# Patient Record
Sex: Female | Born: 1945
Health system: Southern US, Community
[De-identification: ages and names within clinical notes are randomized; demographics above are authoritative.]

## PROBLEM LIST (undated history)

## (undated) DIAGNOSIS — R42 Dizziness and giddiness: Secondary | ICD-10-CM

## (undated) DIAGNOSIS — K219 Gastro-esophageal reflux disease without esophagitis: Secondary | ICD-10-CM

## (undated) DIAGNOSIS — R7303 Prediabetes: Secondary | ICD-10-CM

## (undated) DIAGNOSIS — I10 Essential (primary) hypertension: Secondary | ICD-10-CM

## (undated) DIAGNOSIS — D4860 Neoplasm of uncertain behavior of unspecified breast: Secondary | ICD-10-CM

## (undated) DIAGNOSIS — C801 Malignant (primary) neoplasm, unspecified: Secondary | ICD-10-CM

## (undated) DIAGNOSIS — T7840XA Allergy, unspecified, initial encounter: Secondary | ICD-10-CM

## (undated) DIAGNOSIS — M1990 Unspecified osteoarthritis, unspecified site: Secondary | ICD-10-CM

## (undated) DIAGNOSIS — E785 Hyperlipidemia, unspecified: Secondary | ICD-10-CM

## (undated) HISTORY — DX: Neoplasm of uncertain behavior of unspecified breast: D48.60

## (undated) HISTORY — PX: HAMMER TOE SURGERY: SHX385

## (undated) HISTORY — DX: Hyperlipidemia, unspecified: E78.5

## (undated) HISTORY — PX: BUNIONECTOMY: SHX129

## (undated) HISTORY — PX: HEEL SPUR SURGERY: SHX665

## (undated) HISTORY — DX: Allergy, unspecified, initial encounter: T78.40XA

## (undated) HISTORY — DX: Essential (primary) hypertension: I10

---

## 1966-01-15 HISTORY — PX: BREAST EXCISIONAL BIOPSY: SUR124

## 2002-01-15 HISTORY — PX: FOOT SURGERY: SHX648

## 2003-04-02 ENCOUNTER — Other Ambulatory Visit: Payer: Self-pay

## 2005-01-15 DIAGNOSIS — D4860 Neoplasm of uncertain behavior of unspecified breast: Secondary | ICD-10-CM

## 2005-01-15 HISTORY — PX: BREAST EXCISIONAL BIOPSY: SUR124

## 2005-01-15 HISTORY — DX: Neoplasm of uncertain behavior of unspecified breast: D48.60

## 2005-03-06 ENCOUNTER — Ambulatory Visit: Payer: Self-pay | Admitting: Family Medicine

## 2005-03-16 ENCOUNTER — Ambulatory Visit: Payer: Self-pay | Admitting: Family Medicine

## 2005-04-02 ENCOUNTER — Other Ambulatory Visit: Payer: Self-pay

## 2005-04-03 ENCOUNTER — Ambulatory Visit: Payer: Self-pay | Admitting: General Surgery

## 2005-07-16 ENCOUNTER — Ambulatory Visit: Payer: Self-pay | Admitting: General Surgery

## 2006-03-19 ENCOUNTER — Ambulatory Visit: Payer: Self-pay | Admitting: General Surgery

## 2006-06-10 ENCOUNTER — Emergency Department: Payer: Self-pay | Admitting: Emergency Medicine

## 2006-06-10 ENCOUNTER — Other Ambulatory Visit: Payer: Self-pay

## 2007-03-31 ENCOUNTER — Ambulatory Visit: Payer: Self-pay | Admitting: General Surgery

## 2007-07-30 ENCOUNTER — Ambulatory Visit: Payer: Self-pay | Admitting: Family Medicine

## 2008-03-31 ENCOUNTER — Ambulatory Visit: Payer: Self-pay | Admitting: General Surgery

## 2009-01-15 HISTORY — PX: COLONOSCOPY: SHX174

## 2009-01-15 LAB — HM COLONOSCOPY: HM COLON: NORMAL

## 2009-04-01 ENCOUNTER — Ambulatory Visit: Payer: Self-pay | Admitting: General Surgery

## 2009-08-29 ENCOUNTER — Ambulatory Visit: Payer: Self-pay | Admitting: Gastroenterology

## 2009-09-29 ENCOUNTER — Ambulatory Visit: Payer: Self-pay | Admitting: Gastroenterology

## 2010-04-17 ENCOUNTER — Ambulatory Visit: Payer: Self-pay | Admitting: General Surgery

## 2011-04-18 ENCOUNTER — Ambulatory Visit: Payer: Self-pay | Admitting: General Surgery

## 2012-03-08 ENCOUNTER — Encounter: Payer: Self-pay | Admitting: *Deleted

## 2012-04-23 ENCOUNTER — Ambulatory Visit: Payer: Self-pay | Admitting: Family Medicine

## 2012-04-23 ENCOUNTER — Ambulatory Visit: Payer: Self-pay | Admitting: General Surgery

## 2012-04-23 LAB — HM DEXA SCAN: HM Dexa Scan: NORMAL

## 2012-04-24 ENCOUNTER — Encounter: Payer: Self-pay | Admitting: General Surgery

## 2012-05-12 ENCOUNTER — Ambulatory Visit (INDEPENDENT_AMBULATORY_CARE_PROVIDER_SITE_OTHER): Payer: Medicare Other | Admitting: General Surgery

## 2012-05-12 ENCOUNTER — Encounter: Payer: Self-pay | Admitting: General Surgery

## 2012-05-12 ENCOUNTER — Other Ambulatory Visit: Payer: Self-pay | Admitting: *Deleted

## 2012-05-12 VITALS — BP 130/74 | HR 72 | Resp 14 | Ht 67.0 in | Wt 187.0 lb

## 2012-05-12 DIAGNOSIS — Z1231 Encounter for screening mammogram for malignant neoplasm of breast: Secondary | ICD-10-CM

## 2012-05-12 DIAGNOSIS — D4860 Neoplasm of uncertain behavior of unspecified breast: Secondary | ICD-10-CM

## 2012-05-12 DIAGNOSIS — Z803 Family history of malignant neoplasm of breast: Secondary | ICD-10-CM | POA: Insufficient documentation

## 2012-05-12 NOTE — Progress Notes (Signed)
Patient will be asked to return to the office in one year for a bilateral screening mammogram. 

## 2012-05-12 NOTE — Patient Instructions (Addendum)
Patient to return in 1 year with Bilateral Screening Mammogram. Patient asked to bring all medications with her to next office visit.

## 2012-05-12 NOTE — Progress Notes (Signed)
Patient ID: Stacy [name], female   DOB: Dec 01, 1945, 67 y.o.   MRN: [id number]  Chief Complaint  Patient presents with  . Follow-up    mammogram    HPI Stacy [name] is a 67 y.o. female here today following from her mammogram done on 04/23/12 cat 2. She has history of ALH.  Patient states she feels no lumps and is not having any breast problems. The patient has had breast biopsies in the past. She has a sister with a history of breast cancer. Past states she is on a medication but is unable to tell me the name of medication.  HPI  Past Medical History  Diagnosis Date  . Hypertension   . Neoplasm of uncertain behavior of breast 2007    Past Surgical History  Procedure Laterality Date  . Breast biopsy  1968  . Foot surgery  2004  . Colonoscopy  2011    DR,ISHAKIS    Family History  Problem Relation Age of Onset  . Breast cancer Sister     Social History History  Substance Use Topics  . Smoking status: Never Smoker   . Smokeless tobacco: Not on file  . Alcohol Use: No    Allergies  Allergen Reactions  . Altace (Ramipril) Swelling    Current Outpatient Prescriptions  Medication Sig Dispense Refill  . aspirin 81 MG tablet Take 81 mg by mouth daily.       No current facility-administered medications for this visit.    Review of Systems Review of Systems  Constitutional: Negative.   Respiratory: Negative.   Cardiovascular: Negative.     Blood pressure 130/74, pulse 72, resp. rate 14, height 5\' 7"  (1.702 m), weight 187 lb (84.823 kg).  Physical Exam Physical Exam  Constitutional: She appears well-developed and well-nourished.  Eyes: Conjunctivae are normal. No scleral icterus.  Neck: Trachea normal. No mass and no thyromegaly present.  Cardiovascular: Normal rate, regular rhythm and normal heart sounds.   No murmur heard. Pulses:      Dorsalis pedis pulses are 2+ on the right side, and 2+ on the left side.       Posterior tibial pulses are 2+ on the right side, and  2+ on the left side.  No edema in the legs. Mild skin browning lower 1/3 of both legs. Minimal varicose veins.   Pulmonary/Chest: Effort normal and breath sounds normal. Right breast exhibits no inverted nipple, no mass, no nipple discharge, no skin change and no tenderness. Left breast exhibits no inverted nipple, no mass, no nipple discharge, no skin change and no tenderness. Breasts are symmetrical.  Abdominal: Soft. Normal appearance and bowel sounds are normal. There is no hepatosplenomegaly. There is no tenderness. No hernia.  Lymphadenopathy:    She has no cervical adenopathy.    She has no axillary adenopathy.    Data Reviewed Mammogram reviewed and stable.    Assessment    Stable Exam.     Plan    1 year follow up with mammogram.        Stacy [name] G 05/12/2012, 7:44 PM

## 2012-05-13 ENCOUNTER — Other Ambulatory Visit: Payer: Self-pay | Admitting: *Deleted

## 2013-01-15 HISTORY — PX: BREAST BIOPSY: SHX20

## 2013-03-11 ENCOUNTER — Encounter: Payer: Self-pay | Admitting: *Deleted

## 2013-04-24 ENCOUNTER — Ambulatory Visit: Payer: Self-pay | Admitting: General Surgery

## 2013-05-01 ENCOUNTER — Ambulatory Visit: Payer: Self-pay | Admitting: General Surgery

## 2013-05-11 ENCOUNTER — Encounter: Payer: Self-pay | Admitting: General Surgery

## 2013-05-12 ENCOUNTER — Ambulatory Visit (INDEPENDENT_AMBULATORY_CARE_PROVIDER_SITE_OTHER): Payer: Commercial Managed Care - HMO | Admitting: General Surgery

## 2013-05-12 ENCOUNTER — Encounter: Payer: Self-pay | Admitting: General Surgery

## 2013-05-12 ENCOUNTER — Telehealth: Payer: Self-pay

## 2013-05-12 VITALS — BP 130/68 | HR 72 | Resp 12 | Ht 66.0 in | Wt 185.0 lb

## 2013-05-12 DIAGNOSIS — R920 Mammographic microcalcification found on diagnostic imaging of breast: Secondary | ICD-10-CM

## 2013-05-12 DIAGNOSIS — Z803 Family history of malignant neoplasm of breast: Secondary | ICD-10-CM

## 2013-05-12 NOTE — Progress Notes (Signed)
Patient ID: Stacy [name], female   DOB: 03/06/1945, 68 y.o.   MRN: [id number]  Chief Complaint  Patient presents with  . Follow-up    1 year follow up screening mammogram     HPI Stacy [name] is a 68 y.o. female who presents for a breast evaluation. Has history of ALH and her sister with breast cancer. The most recent mammogram was done on 05/01/13. Patient does perform regular self breast checks and gets regular mammograms done.  The patient denies any new problems with her breasts at this time.    HPI  Past Medical History  Diagnosis Date  . Hypertension   . Neoplasm of uncertain behavior of breast 2007    Past Surgical History  Procedure Laterality Date  . Breast biopsy  1968  . Foot surgery  2004  . Colonoscopy  2011    DR,ISHAKIS    Family History  Problem Relation Age of Onset  . Breast cancer Sister     Social History History  Substance Use Topics  . Smoking status: Never Smoker   . Smokeless tobacco: Not on file  . Alcohol Use: No    Allergies  Allergen Reactions  . Altace [Ramipril] Swelling    Current Outpatient Prescriptions  Medication Sig Dispense Refill  . aspirin 81 MG tablet Take 81 mg by mouth daily.      . Cholecalciferol (VITAMIN D3) 2000 UNITS TABS Take 1 tablet by mouth daily.      . Cinnamon 500 MG capsule Take 1,000 mg by mouth daily.      Marland Kitchen FISH OIL-BORAGE-FLAX-SAFFLOWER PO Take 2 tablets by mouth daily.      Marland Kitchen POTASSIUM PO Take 1 tablet by mouth daily.      Marland Kitchen triamterene-hydrochlorothiazide (MAXZIDE) 75-50 MG per tablet Take 1 tablet by mouth daily.       No current facility-administered medications for this visit.    Review of Systems Review of Systems  Constitutional: Negative.   Respiratory: Negative.   Cardiovascular: Negative.     Blood pressure 130/68, pulse 72, resp. rate 12, height 5\' 6"  (1.676 m), weight 185 lb (83.915 kg).  Physical Exam Physical Exam  Constitutional: She is oriented to person, place, and time. She  appears well-developed and well-nourished.  Eyes: Conjunctivae are normal. No scleral icterus.  Neck: Neck supple. No thyromegaly present.  Cardiovascular: Normal rate, regular rhythm and normal heart sounds.   No murmur heard. Pulmonary/Chest: Effort normal and breath sounds normal. Right breast exhibits no inverted nipple, no mass, no nipple discharge, no skin change and no tenderness. Left breast exhibits no inverted nipple, no mass, no nipple discharge, no skin change and no tenderness.  Abdominal: Soft. Normal appearance and bowel sounds are normal. There is no hepatosplenomegaly. There is no tenderness. No hernia.  Lymphadenopathy:    She has no cervical adenopathy.    She has no axillary adenopathy.  Neurological: She is alert and oriented to person, place, and time.  Skin: Skin is warm and dry.    Data Reviewed  Mammogram reviewed. 3 focal clusters of microcalcification  in right breast.   Assessment    Mammographic microcalcification right breast. Family history of breast cancer.     Plan    Discussed stereotactic biopsy. Pt agreeable. Procedure explained to her        Seeplaputhur Robinette Haines 05/12/2013, 1:12 PM

## 2013-05-12 NOTE — Patient Instructions (Signed)
Patient to be scheduled for right breast stereotactic breast biopsy. Continue self breast exams. Call office for any new breast issues or concerns.  Stereotactic Breast Biopsy  A stereotactic breast biopsy is a procedure in which mammography is used in the collection of a sample of breast tissue. Mammography is a type of X-ray exam of the breasts that produces an image called a mammogram. The mammogram allows your health care provider to precisely locate the area of the breast from which a tissue sample will be taken. The tissue is then examined under a microscope to see if cancerous cells are present. A breast biopsy is done when:   A lump, abnormality, or mass is seen in the breast on a breast X-ray (mammogram).   Small calcium deposits (calcifications) are seen in the breast.   The shape or appearance of the breasts changes.   The shape or appearance of the nipples changes. You may have unusual or bloody discharge coming from the nipples, or you may have crusting, retraction, or dimpling of the nipples. A breast biopsy can indicate if you need surgery or other treatment.  LET [facility] CARE PROVIDER KNOW ABOUT:  Any allergies you have.  All medicines you are taking, including vitamins, herbs, eye drops, creams, and over-the-counter medicines.  Previous problems you or members of your family have had with the use of anesthetics.  Any blood disorders you have.  Previous surgeries you have had.  Medical conditions you have. RISKS AND COMPLICATIONS Generally, stereotactic breast biopsy is a safe procedure. However, as with any procedure, complications can occur. Possible complications include:  Infection at the needle-insertion site.   Bleeding or bruising after surgery.  The breast may become altered or deformed as a result of the procedure.  The needle may go through the chest wall into the lung area.  BEFORE THE PROCEDURE  Wear a supportive bra to the procedure.  You  will be asked to remove jewelry, dentures, eyeglasses, metal objects, or clothing that might interfere with the X-ray images. You may want to leave some of these objects at home.  Arrange for someone to drive you home after the procedure if desired. PROCEDURE  A stereotactic breast biopsy is done while you are awake. During the procedure, relax as much as possible. Let your health care provider know if you are uncomfortable, anxious, or in pain. Usually, the only discomfort felt during the procedure is caused by staying in one position for the length of the procedure. This discomfort can be reduced by carefully placed cushions. Most of the time the biopsy is done using a table with openings on it. You will be asked to lie facedown on the table and place your breasts through the openings. Your breast is compressed between metal plates to get good X-ray images. Your skin will be cleaned, and a numbing medicine (local anesthetic) will be injected. A small cut (incision) will be made in your breast. The tip of the biopsy needle will be directed through the incision. Several small pieces of suspicious tissue will be taken. Then, a final set of X-ray images will be obtained. If they show that the suspicious tissue has been mostly or completely removed, a small clip will be left at the biopsy site. This is done so that the biopsy site can be easily located if the results of the biopsy show that the tissue is cancerous.  After the procedure, the incision will be stitched (sutured) or taped and covered with a bandage (  dressing). Your health care provider may apply a pressure dressing and an ice pack to prevent bleeding and swelling in the breast.  A stereotactic breast biopsy can take 30 minutes or more. AFTER THE PROCEDURE  If you are doing well and have no problems, you will be allowed to go home.  Document Released: 09/30/2002 Document Revised: 09/03/2012 Document Reviewed: 07/31/2012 [facility] Patient  Information 2014 [place], [place].

## 2013-05-12 NOTE — Telephone Encounter (Signed)
Patient is scheduled for a Right Stereo Biopsy at [facility] on 05/25/13 at 3:00 pm. She will arrive by 2:45 pm. Patient is scheduled for a follow up appointment with the nurse for 06/01/13 at 9:30 am. Patient is aware of dates, times, and instructions.

## 2013-05-25 ENCOUNTER — Ambulatory Visit: Payer: Self-pay | Admitting: General Surgery

## 2013-05-25 DIAGNOSIS — R920 Mammographic microcalcification found on diagnostic imaging of breast: Secondary | ICD-10-CM

## 2013-05-27 ENCOUNTER — Telehealth: Payer: Self-pay | Admitting: *Deleted

## 2013-05-27 LAB — PATHOLOGY REPORT

## 2013-05-27 NOTE — Telephone Encounter (Signed)
Placed in recalls for 6 month DX right mammogram. .asa

## 2013-05-27 NOTE — Telephone Encounter (Signed)
Notified patient as instructed, patient pleased. Benign pathology per Dr Jamal Collin and Dr Reuel Derby. Discussed follow-up appointments, patient agrees.

## 2013-05-28 ENCOUNTER — Encounter: Payer: Self-pay | Admitting: General Surgery

## 2013-06-01 ENCOUNTER — Ambulatory Visit (INDEPENDENT_AMBULATORY_CARE_PROVIDER_SITE_OTHER): Payer: Self-pay | Admitting: *Deleted

## 2013-06-01 DIAGNOSIS — R920 Mammographic microcalcification found on diagnostic imaging of breast: Secondary | ICD-10-CM

## 2013-06-01 NOTE — Progress Notes (Signed)
Patient here today for follow up pos rightt breast biopsy.   Minimal bruising noted.  The patient is aware that a heating pad may be used for comfort as needed.  Aware of pathology. Follow up as scheduled.

## 2013-06-10 ENCOUNTER — Encounter: Payer: Self-pay | Admitting: General Surgery

## 2013-10-01 LAB — LIPID PANEL
Cholesterol: 167 mg/dL (ref 0–200)
HDL: 73 mg/dL — AB (ref 35–70)
LDL CALC: 80 mg/dL
TRIGLYCERIDES: 72 mg/dL (ref 40–160)

## 2013-10-01 LAB — HM PAP SMEAR: HM Pap smear: NORMAL

## 2013-10-01 LAB — HEMOGLOBIN A1C: HEMOGLOBIN A1C: 6.3 % — AB (ref 4.0–6.0)

## 2013-11-12 ENCOUNTER — Encounter: Payer: Self-pay | Admitting: Podiatry

## 2013-11-12 ENCOUNTER — Ambulatory Visit (INDEPENDENT_AMBULATORY_CARE_PROVIDER_SITE_OTHER): Payer: Commercial Managed Care - HMO | Admitting: Podiatry

## 2013-11-12 ENCOUNTER — Ambulatory Visit (INDEPENDENT_AMBULATORY_CARE_PROVIDER_SITE_OTHER): Payer: Commercial Managed Care - HMO

## 2013-11-12 VITALS — BP 132/74 | HR 91 | Resp 17

## 2013-11-12 DIAGNOSIS — M7662 Achilles tendinitis, left leg: Secondary | ICD-10-CM

## 2013-11-12 DIAGNOSIS — M25572 Pain in left ankle and joints of left foot: Secondary | ICD-10-CM

## 2013-11-12 DIAGNOSIS — M898X7 Other specified disorders of bone, ankle and foot: Secondary | ICD-10-CM

## 2013-11-12 DIAGNOSIS — M2570 Osteophyte, unspecified joint: Secondary | ICD-10-CM

## 2013-11-12 DIAGNOSIS — M25472 Effusion, left ankle: Secondary | ICD-10-CM

## 2013-11-12 DIAGNOSIS — M25475 Effusion, left foot: Secondary | ICD-10-CM

## 2013-11-12 NOTE — Patient Instructions (Signed)
Achilles Tendinitis   with Rehab  Achilles tendinitis is a disorder of the Achilles tendon. The Achilles tendon connects the large calf muscles (Gastrocnemius and Soleus) to the heel bone (calcaneus). This tendon is sometimes called the heel cord. It is important for pushing-off and standing on your toes and is important for walking, running, or jumping. Tendinitis is often caused by overuse and repetitive microtrauma.  SYMPTOMS  · Pain, tenderness, swelling, warmth, and redness may occur over the Achilles tendon even at rest.  · Pain with pushing off, or flexing or extending the ankle.  · Pain that is worsened after or during activity.  CAUSES   · Overuse sometimes seen with rapid increase in exercise programs or in sports requiring running and jumping.  · Poor physical conditioning (strength and flexibility or endurance).  · Running sports, especially training running down hills.  · Inadequate warm-up before practice or play or failure to stretch before participation.  · Injury to the tendon.  PREVENTION   · Warm up and stretch before practice or competition.  · Allow time for adequate rest and recovery between practices and competition.  · Keep up conditioning.  ¨ Keep up ankle and leg flexibility.  ¨ Improve or keep muscle strength and endurance.  ¨ Improve cardiovascular fitness.  · Use proper technique.  · Use proper equipment (shoes, skates).  · To help prevent recurrence, taping, protective strapping, or an adhesive bandage may be recommended for several weeks after healing is complete.  PROGNOSIS   · Recovery may take weeks to several months to heal.  · Longer recovery is expected if symptoms have been prolonged.  · Recovery is usually quicker if the inflammation is due to a direct blow as compared with overuse or sudden strain.  RELATED COMPLICATIONS   · Healing time will be prolonged if the condition is not correctly treated. The injury must be given plenty of time to heal.  · Symptoms can reoccur if  activity is resumed too soon.  · Untreated, tendinitis may increase the risk of tendon rupture requiring additional time for recovery and possibly surgery.  TREATMENT   · The first treatment consists of rest anti-inflammatory medication, and ice to relieve the pain.  · Stretching and strengthening exercises after resolution of pain will likely help reduce the risk of recurrence. Referral to a physical therapist or athletic trainer for further evaluation and treatment may be helpful.  · A walking boot or cast may be recommended to rest the Achilles tendon. This can help break the cycle of inflammation and microtrauma.  · Arch supports (orthotics) may be prescribed or recommended by your caregiver as an adjunct to therapy and rest.  · Surgery to remove the inflamed tendon lining or degenerated tendon tissue is rarely necessary and has shown less than predictable results.  MEDICATION   · Nonsteroidal anti-inflammatory medications, such as aspirin and ibuprofen, may be used for pain and inflammation relief. Do not take within 7 days before surgery. Take these as directed by your caregiver. Contact your caregiver immediately if any bleeding, stomach upset, or signs of allergic reaction occur. Other minor pain relievers, such as acetaminophen, may also be used.  · Pain relievers may be prescribed as necessary by your caregiver. Do not take prescription pain medication for longer than 4 to 7 days. Use only as directed and only as much as you need.  · Cortisone injections are rarely indicated. Cortisone injections may weaken tendons and predispose to rupture. It is better   to give the condition more time to heal than to use them.  HEAT AND COLD  · Cold is used to relieve pain and reduce inflammation for acute and chronic Achilles tendinitis. Cold should be applied for 10 to 15 minutes every 2 to 3 hours for inflammation and pain and immediately after any activity that aggravates your symptoms. Use ice packs or an ice  massage.  · Heat may be used before performing stretching and strengthening activities prescribed by your caregiver. Use a heat pack or a warm soak.  SEEK MEDICAL CARE IF:  · Symptoms get worse or do not improve in 2 weeks despite treatment.  · New, unexplained symptoms develop. Drugs used in treatment may produce side effects.  EXERCISES  RANGE OF MOTION (ROM) AND STRETCHING EXERCISES - Achilles Tendinitis   These exercises may help you when beginning to rehabilitate your injury. Your symptoms may resolve with or without further involvement from your physician, physical therapist or athletic trainer. While completing these exercises, remember:   · Restoring tissue flexibility helps normal motion to return to the joints. This allows healthier, less painful movement and activity.  · An effective stretch should be held for at least 30 seconds.  · A stretch should never be painful. You should only feel a gentle lengthening or release in the stretched tissue.  STRETCH - Gastroc, Standing   · Place hands on wall.  · Extend right / left leg, keeping the front knee somewhat bent.  · Slightly point your toes inward on your back foot.  · Keeping your right / left heel on the floor and your knee straight, shift your weight toward the wall, not allowing your back to arch.  · You should feel a gentle stretch in the right / left calf. Hold this position for __________ seconds.  Repeat __________ times. Complete this stretch __________ times per day.  STRETCH - Soleus, Standing   · Place hands on wall.  · Extend right / left leg, keeping the other knee somewhat bent.  · Slightly point your toes inward on your back foot.  · Keep your right / left heel on the floor, bend your back knee, and slightly shift your weight over the back leg so that you feel a gentle stretch deep in your back calf.  · Hold this position for __________ seconds.  Repeat __________ times. Complete this stretch __________ times per day.  STRETCH -  Gastrocsoleus, Standing   Note: This exercise can place a lot of stress on your foot and ankle. Please complete this exercise only if specifically instructed by your caregiver.   · Place the ball of your right / left foot on a step, keeping your other foot firmly on the same step.  · Hold on to the wall or a rail for balance.  · Slowly lift your other foot, allowing your body weight to press your heel down over the edge of the step.  · You should feel a stretch in your right / left calf.  · Hold this position for __________ seconds.  · Repeat this exercise with a slight bend in your knee.  Repeat __________ times. Complete this stretch __________ times per day.   STRENGTHENING EXERCISES - Achilles Tendinitis  These exercises may help you when beginning to rehabilitate your injury. They may resolve your symptoms with or without further involvement from your physician, physical therapist or athletic trainer. While completing these exercises, remember:   · Muscles can gain both the endurance   and the strength needed for everyday activities through controlled exercises.  · Complete these exercises as instructed by your physician, physical therapist or athletic trainer. Progress the resistance and repetitions only as guided.  · You may experience muscle soreness or fatigue, but the pain or discomfort you are trying to eliminate should never worsen during these exercises. If this pain does worsen, stop and make certain you are following the directions exactly. If the pain is still present after adjustments, discontinue the exercise until you can discuss the trouble with your clinician.  STRENGTH - Plantar-flexors   · Sit with your right / left leg extended. Holding onto both ends of a rubber exercise band/tubing, loop it around the ball of your foot. Keep a slight tension in the band.  · Slowly push your toes away from you, pointing them downward.  · Hold this position for __________ seconds. Return slowly, controlling the  tension in the band/tubing.  Repeat __________ times. Complete this exercise __________ times per day.   STRENGTH - Plantar-flexors   · Stand with your feet shoulder width apart. Steady yourself with a wall or table using as little support as needed.  · Keeping your weight evenly spread over the width of your feet, rise up on your toes.*  · Hold this position for __________ seconds.  Repeat __________ times. Complete this exercise __________ times per day.   *If this is too easy, shift your weight toward your right / left leg until you feel challenged. Ultimately, you may be asked to do this exercise with your right / left foot only.  STRENGTH - Plantar-flexors, Eccentric   Note: This exercise can place a lot of stress on your foot and ankle. Please complete this exercise only if specifically instructed by your caregiver.   · Place the balls of your feet on a step. With your hands, use only enough support from a wall or rail to keep your balance.  · Keep your knees straight and rise up on your toes.  · Slowly shift your weight entirely to your right / left toes and pick up your opposite foot. Gently and with controlled movement, lower your weight through your right / left foot so that your heel drops below the level of the step. You will feel a slight stretch in the back of your calf at the end position.  · Use the healthy leg to help rise up onto the balls of both feet, then lower weight only on the right / left leg again. Build up to 15 repetitions. Then progress to 3 consecutive sets of 15 repetitions.*  · After completing the above exercise, complete the same exercise with a slight knee bend (about 30 degrees). Again, build up to 15 repetitions. Then progress to 3 consecutive sets of 15 repetitions.*  Perform this exercise __________ times per day.   *When you easily complete 3 sets of 15, your physician, physical therapist or athletic trainer may advise you to add resistance by wearing a backpack filled with  additional weight.  STRENGTH - Plantar Flexors, Seated   · Sit on a chair that allows your feet to rest flat on the ground. If necessary, sit at the edge of the chair.  · Keeping your toes firmly on the ground, lift your right / left heel as far as you can without increasing any discomfort in your ankle.  Repeat __________ times. Complete this exercise __________ times a day.  *If instructed by your physician, physical therapist or athletic   trainer, you may add ____________________ of resistance by placing a weighted object on your right / left knee.  Document Released: 08/02/2004 Document Revised: 03/26/2011 Document Reviewed: 04/15/2008  [employer]® Patient Information ©2015 [employer]. This information is not intended to replace advice given to you by your health care provider. Make sure you discuss any questions you have with your health care provider.

## 2013-11-12 NOTE — Progress Notes (Signed)
   Subjective:    Patient ID: Stacy [name], female    DOB: 05/11/1945, 67 y.o.   MRN: [id number]  HPI Stacy [name], 68 year old female, presents the office today with complaints of pain in the back of her left heel which has been ongoing for several months.she previously is undergone posterior calcaneal exostectomy to the right foot. She states for the left foot she has been using no break without much resolution in symptoms. She denies any recent injury or trauma to the area. Pain is mostly with weightbearing and prolonged ambulation. No other complaints at this time.   Review of Systems  All other systems reviewed and are negative.      Objective:   Physical Exam Objective: AAO x3, NAD DP/PT pulses palpable bilaterally, CRT less than 3 seconds; varicose veins bilaterally. Protective sensation intact with Derrel Nip monofilament, vibratory sensation intact, Achilles tendon reflex intact Tenderness palpation over the posterior aspect of the left calcaneus at the insertion of the Achilles tendon. No pain on the course of the Achilles tendon itself. Thompson test was performed and the Achilles tendon appears intact. There is no pain with lateral compression of the calcaneus or with vibratory sensation. No overlying edema, erythema, increased warmth. MMT 5/5, ROM WNL No calf pain, swelling, warmth, erythema No open lesions.       Assessment & Plan:  68 year old female with left retrocalcaneal exostosis and insertional Achilles tendinitis. -X-rays were obtained and reviewed with the patient. -Conservative versus surgical treatment discussed including alternatives, risks, complications. -For now we'll start with conservative therapy. Heel lifts were dispensed to apply to the shoes. Also discussed stretching exercises.  -Ice to the afected area. -If she continues to have symptoms despite conservative therapy we'll proceed with surgical intervention. -follow-up in one month or sooner  if any problems are to arise or any changes symptoms. In the meantime call the office with any questions, concerns.

## 2013-11-16 ENCOUNTER — Encounter: Payer: Self-pay | Admitting: Podiatry

## 2013-11-23 LAB — HM MAMMOGRAPHY

## 2013-11-25 ENCOUNTER — Encounter: Payer: Self-pay | Admitting: General Surgery

## 2013-11-25 ENCOUNTER — Ambulatory Visit: Payer: Self-pay | Admitting: General Surgery

## 2013-12-03 ENCOUNTER — Encounter: Payer: Self-pay | Admitting: General Surgery

## 2013-12-03 ENCOUNTER — Ambulatory Visit (INDEPENDENT_AMBULATORY_CARE_PROVIDER_SITE_OTHER): Payer: Commercial Managed Care - HMO | Admitting: General Surgery

## 2013-12-03 VITALS — BP 132/74 | HR 76 | Resp 12 | Ht 66.0 in | Wt 188.0 lb

## 2013-12-03 DIAGNOSIS — D4860 Neoplasm of uncertain behavior of unspecified breast: Secondary | ICD-10-CM

## 2013-12-03 DIAGNOSIS — Z803 Family history of malignant neoplasm of breast: Secondary | ICD-10-CM

## 2013-12-03 NOTE — Patient Instructions (Addendum)
Patient to return in six month bilateral diagnotic mammogram and MRI.Continue self breast exams. Call office for any new breast issues or concerns.

## 2013-12-03 NOTE — Progress Notes (Signed)
Patient ID: Stacy [name], female   DOB: 12-10-1945, 68 y.o.   MRN: [id number]  Chief Complaint  Patient presents with  . Follow-up    mammogram    HPI Stacy [name] is a 68 y.o. female who presents for a breast evaluation. The most recent right breast mammogram was done on 11/23/13 .  Patient does perform regular self breast checks and gets regular mammograms done.    HPI  Past Medical History  Diagnosis Date  . Hypertension   . Neoplasm of uncertain behavior of breast 2007    Past Surgical History  Procedure Laterality Date  . Breast biopsy  1968  . Foot surgery  2004  . Colonoscopy  2011    DR,ISHAKIS    Family History  Problem Relation Age of Onset  . Breast cancer Sister     Social History History  Substance Use Topics  . Smoking status: Never Smoker   . Smokeless tobacco: Not on file  . Alcohol Use: No    Allergies  Allergen Reactions  . Altace [Ramipril] Swelling    Current Outpatient Prescriptions  Medication Sig Dispense Refill  . aspirin 81 MG tablet Take 81 mg by mouth daily.    . Cholecalciferol (VITAMIN D3) 2000 UNITS TABS Take 1 tablet by mouth daily.    . Cinnamon 500 MG capsule Take 1,000 mg by mouth daily.    Marland Kitchen FISH OIL-BORAGE-FLAX-SAFFLOWER PO Take 2 tablets by mouth daily.    Marland Kitchen POTASSIUM PO Take 1 tablet by mouth daily.    Marland Kitchen triamterene-hydrochlorothiazide (MAXZIDE) 75-50 MG per tablet Take 1 tablet by mouth daily.     No current facility-administered medications for this visit.    Review of Systems Review of Systems  Constitutional: Negative.   Respiratory: Negative.   Cardiovascular: Negative.     Blood pressure 132/74, pulse 76, resp. rate 12, height 5\' 6"  (1.676 m), weight 188 lb (85.276 kg).  Physical Exam Physical Exam  Constitutional: She is oriented to person, place, and time. She appears well-developed and well-nourished.  Eyes: Conjunctivae are normal. No scleral icterus.  Neck: Neck supple.  Cardiovascular:  Normal rate, regular rhythm and normal heart sounds.   Pulmonary/Chest: Effort normal and breath sounds normal. Right breast exhibits no inverted nipple, no mass, no nipple discharge, no skin change and no tenderness. Left breast exhibits no inverted nipple, no mass, no skin change and no tenderness.  Lymphadenopathy:    She has no cervical adenopathy.    She has no axillary adenopathy.  Neurological: She is alert and oriented to person, place, and time.  Skin: Skin is warm and dry.    Data Reviewed Mammogram right reviewed. No changes noticed and biopsy markers( three ) noticed with no developing abnormalities.  Assessment   Stable exam. Pt had stereo biopsy of 3 areas in right breast 6 mos ago. All showed FC changes, , one with tiny focus of radial scar. This was discussed with pathologist then. Pt is high risk due to [place] and prior biopsy showing ALH. Will add MRI breast to her scheduled screening mammogram in 6 mos.     Plan    Patient to return in six month bilateral diagnotic mammogram and MRI.       Chrstopher Malenfant G 12/03/2013, 2:16 PM

## 2013-12-15 ENCOUNTER — Ambulatory Visit: Payer: Commercial Managed Care - HMO | Admitting: Podiatry

## 2013-12-24 ENCOUNTER — Ambulatory Visit: Payer: Commercial Managed Care - HMO | Admitting: Podiatry

## 2013-12-24 ENCOUNTER — Ambulatory Visit (INDEPENDENT_AMBULATORY_CARE_PROVIDER_SITE_OTHER): Payer: Commercial Managed Care - HMO | Admitting: Podiatry

## 2013-12-24 VITALS — BP 125/74 | HR 82 | Resp 16

## 2013-12-24 DIAGNOSIS — M2570 Osteophyte, unspecified joint: Secondary | ICD-10-CM

## 2013-12-24 DIAGNOSIS — M7662 Achilles tendinitis, left leg: Secondary | ICD-10-CM

## 2013-12-24 DIAGNOSIS — M898X7 Other specified disorders of bone, ankle and foot: Secondary | ICD-10-CM

## 2013-12-24 NOTE — Progress Notes (Signed)
Patient ID: Stacy [name], female   DOB: Jul 26, 1945, 68 y.o.   MRN: [id number]  Subjective: 68 year old female returns the office they for follow-up evaluation of left retrocalcaneal exostosis and insertional Achilles tendinitis. She states she's been continuing with the stretching exercises as well as using heel lifts and she states that she has had improved symptoms compared to prior appointment. No acute changes since last appointment. No other complaints at this time. Denies any systemic complaints such as fevers, chills, nausea, vomiting.  Objective: AAO x3, NAD DP/PT pulses palpable bilaterally, CRT less than 3 seconds Protective sensation intact with Simms Weinstein monofilament, vibratory sensation intact, Achilles tendon reflex intact Mild tenderness overlying the posterior aspect left calcaneus near the insertion of the Achilles tendon. There is no pain along the mid substance of the Achilles tendon. Thompson test was performed and the Achilles tendon was intact. There is no overlying edema, erythema, increase in warmth. There is no pain with lateral compression of the calcaneus or pain with vibratory sensation. MMT 5/5, ROM WNL No open lesions or pre-ulcerative lesions.  Assessment: 68 year old female with symptomatic retrocalcaneal exostosis and insertional Achilles tendinitis  Plan: -Treatment options were discussed including alternatives, risks, complications. -At this time recommended continue with stretching exercises as well as ice to the area. Continue with heel lifts as needed. Discussed with the patient that as symptoms resolve to slowly remove the heel lifts to wear shoes. Also discussed with her supportive shoe gear to support her foot type. -Patient was previously interested in surgery, however at this time as she is having improved symptoms will hold off. Continue to monitor.  -Follow-up as needed. Recommended follow-up in 4 weeks if symptoms do not completely resolve. In  the meantime, call the office in the questions, concerns, change in symptoms.

## 2014-04-30 ENCOUNTER — Other Ambulatory Visit: Payer: Self-pay | Admitting: General Surgery

## 2014-04-30 DIAGNOSIS — R921 Mammographic calcification found on diagnostic imaging of breast: Secondary | ICD-10-CM

## 2014-04-30 DIAGNOSIS — Z1231 Encounter for screening mammogram for malignant neoplasm of breast: Secondary | ICD-10-CM

## 2014-05-18 ENCOUNTER — Ambulatory Visit: Payer: Self-pay

## 2014-05-18 ENCOUNTER — Ambulatory Visit
Admission: RE | Admit: 2014-05-18 | Discharge: 2014-05-18 | Disposition: A | Discharge status: Home / Self Care | Payer: PPO | Source: Ambulatory Visit | Attending: General Surgery | Admitting: General Surgery

## 2014-05-18 DIAGNOSIS — R928 Other abnormal and inconclusive findings on diagnostic imaging of breast: Secondary | ICD-10-CM | POA: Diagnosis not present

## 2014-05-18 DIAGNOSIS — Z1231 Encounter for screening mammogram for malignant neoplasm of breast: Secondary | ICD-10-CM

## 2014-05-19 ENCOUNTER — Other Ambulatory Visit: Payer: Self-pay

## 2014-05-19 DIAGNOSIS — Z803 Family history of malignant neoplasm of breast: Secondary | ICD-10-CM

## 2014-05-19 DIAGNOSIS — R920 Mammographic microcalcification found on diagnostic imaging of breast: Secondary | ICD-10-CM

## 2014-05-19 DIAGNOSIS — N62 Hypertrophy of breast: Secondary | ICD-10-CM

## 2014-06-05 ENCOUNTER — Ambulatory Visit
Admission: RE | Admit: 2014-06-05 | Discharge: 2014-06-05 | Disposition: A | Discharge status: Home / Self Care | Payer: PPO | Source: Ambulatory Visit | Attending: General Surgery | Admitting: General Surgery

## 2014-06-05 DIAGNOSIS — R920 Mammographic microcalcification found on diagnostic imaging of breast: Secondary | ICD-10-CM

## 2014-06-05 DIAGNOSIS — N62 Hypertrophy of breast: Secondary | ICD-10-CM

## 2014-06-05 DIAGNOSIS — Z803 Family history of malignant neoplasm of breast: Secondary | ICD-10-CM

## 2014-06-05 MED ORDER — GADOBENATE DIMEGLUMINE 529 MG/ML IV SOLN
17.0000 mL | Freq: Once | INTRAVENOUS | Status: AC | PRN
  Administered 2014-06-05: 17 mL via INTRAVENOUS

## 2014-06-16 ENCOUNTER — Ambulatory Visit: Payer: TRICARE For Life (TFL) | Admitting: General Surgery

## 2014-06-22 ENCOUNTER — Ambulatory Visit (INDEPENDENT_AMBULATORY_CARE_PROVIDER_SITE_OTHER): Payer: PPO | Admitting: General Surgery

## 2014-06-22 ENCOUNTER — Encounter: Payer: Self-pay | Admitting: General Surgery

## 2014-06-22 VITALS — BP 102/58 | HR 76 | Resp 14 | Ht 66.0 in | Wt 189.0 lb

## 2014-06-22 DIAGNOSIS — Z803 Family history of malignant neoplasm of breast: Secondary | ICD-10-CM

## 2014-06-22 DIAGNOSIS — D4860 Neoplasm of uncertain behavior of unspecified breast: Secondary | ICD-10-CM | POA: Diagnosis not present

## 2014-06-22 NOTE — Patient Instructions (Signed)
Follow up in one year with bilateral diagnostic mammogram and office visit.  Continue self breast exams. Call office for any new breast issues or concerns.  

## 2014-06-22 NOTE — Progress Notes (Signed)
Patient ID: Stacy [name], female   DOB: 1945/12/23, 69 y.o.   MRN: [id number]  Chief Complaint  Patient presents with  . Follow-up    HPI Stacy [name] is a 69 y.o. female.  who presents for a breast evaluation. The most recent mammogram was done on 05-18-14.  Patient does perform regular self breast checks and gets regular mammograms done.  MRI breast on 06-15-14. No new breast issues.  HPI  Past Medical History  Diagnosis Date  . Hypertension   . Neoplasm of uncertain behavior of breast 2007    Past Surgical History  Procedure Laterality Date  . Foot surgery  2004  . Colonoscopy  2011    DR,ISHAKIS  . Breast biopsy Left 1968  . Breast biopsy Right 2007  . Breast biopsy Right 2015    Family History  Problem Relation Age of Onset  . Breast cancer Sister 74    Social History History  Substance Use Topics  . Smoking status: Never Smoker   . Smokeless tobacco: Not on file  . Alcohol Use: No    Allergies  Allergen Reactions  . Altace [Ramipril] Swelling    Current Outpatient Prescriptions  Medication Sig Dispense Refill  . aspirin 81 MG tablet Take 81 mg by mouth daily.    . Cholecalciferol (VITAMIN D3) 2000 UNITS TABS Take 1 tablet by mouth daily.    . Cinnamon 500 MG capsule Take 1,000 mg by mouth daily.    Marland Kitchen FISH OIL-BORAGE-FLAX-SAFFLOWER PO Take 2 tablets by mouth daily.    Marland Kitchen POTASSIUM PO Take 1 tablet by mouth daily.    Marland Kitchen triamterene-hydrochlorothiazide (MAXZIDE) 75-50 MG per tablet Take 1 tablet by mouth daily.     No current facility-administered medications for this visit.    Review of Systems Review of Systems  Constitutional: Negative.   Respiratory: Negative.   Cardiovascular: Negative.     Blood pressure 102/58, pulse 76, resp. rate 14, height 5\' 6"  (1.676 m), weight 189 lb (85.73 kg).  Physical Exam Physical Exam  Constitutional: She is oriented to person, place, and time. She appears well-developed and well-nourished.  Eyes:  Conjunctivae are normal. No scleral icterus.  Neck: Neck supple.  Cardiovascular: Normal rate, regular rhythm and normal heart sounds.   Pulmonary/Chest: Effort normal and breath sounds normal. Right breast exhibits no inverted nipple, no mass, no nipple discharge, no skin change and no tenderness. Left breast exhibits no inverted nipple, no mass, no nipple discharge, no skin change and no tenderness.  Abdominal: Soft. There is no hepatomegaly. There is no tenderness.  Lymphadenopathy:    She has no cervical adenopathy.    She has no axillary adenopathy.  Neurological: She is alert and oriented to person, place, and time.  Skin: Skin is warm and dry.    Data Reviewed Mammogram and MRI breast reviewed and stable.  Assessment    Stable physical exam. MRI and mammogram are stable with previous history of ALH and one tiny focus of radial scar in right breast one year ago.    Plan    Follow up in one year with bilateral diagnostic mammogram and office visit. Continue self breast exams. Call office for any new breast issues or concerns.      PCP:  Silvio Pate 06/22/2014, 3:50 PM

## 2014-10-07 ENCOUNTER — Ambulatory Visit (INDEPENDENT_AMBULATORY_CARE_PROVIDER_SITE_OTHER): Payer: PPO | Admitting: Family Medicine

## 2014-10-07 ENCOUNTER — Encounter: Payer: Self-pay | Admitting: Family Medicine

## 2014-10-07 VITALS — BP 108/60 | HR 78 | Temp 98.2°F | Resp 18 | Ht 66.0 in | Wt 188.5 lb

## 2014-10-07 DIAGNOSIS — E559 Vitamin D deficiency, unspecified: Secondary | ICD-10-CM | POA: Insufficient documentation

## 2014-10-07 DIAGNOSIS — Z13220 Encounter for screening for lipoid disorders: Secondary | ICD-10-CM | POA: Diagnosis not present

## 2014-10-07 DIAGNOSIS — Z23 Encounter for immunization: Secondary | ICD-10-CM

## 2014-10-07 DIAGNOSIS — J302 Other seasonal allergic rhinitis: Secondary | ICD-10-CM | POA: Diagnosis not present

## 2014-10-07 DIAGNOSIS — I10 Essential (primary) hypertension: Secondary | ICD-10-CM | POA: Diagnosis not present

## 2014-10-07 DIAGNOSIS — M7662 Achilles tendinitis, left leg: Secondary | ICD-10-CM | POA: Insufficient documentation

## 2014-10-07 DIAGNOSIS — E119 Type 2 diabetes mellitus without complications: Secondary | ICD-10-CM | POA: Diagnosis not present

## 2014-10-07 DIAGNOSIS — Z0000 Encounter for general adult medical examination without abnormal findings: Secondary | ICD-10-CM | POA: Diagnosis not present

## 2014-10-07 DIAGNOSIS — E1159 Type 2 diabetes mellitus with other circulatory complications: Secondary | ICD-10-CM | POA: Insufficient documentation

## 2014-10-07 DIAGNOSIS — I8390 Asymptomatic varicose veins of unspecified lower extremity: Secondary | ICD-10-CM | POA: Insufficient documentation

## 2014-10-07 MED ORDER — TRIAMTERENE-HCTZ 75-50 MG PO TABS: 1.0000 | ORAL_TABLET | Freq: Every day | ORAL | Status: DC

## 2014-10-07 MED ORDER — LORATADINE 10 MG PO TABS: 10.0000 mg | ORAL_TABLET | Freq: Every day | ORAL | Status: DC

## 2014-10-07 MED ORDER — FLUTICASONE PROPIONATE 50 MCG/ACT NA SUSP: 2.0000 | Freq: Every day | NASAL | Status: DC

## 2014-10-07 NOTE — Patient Instructions (Signed)
  Ms. Uncapher , Thank you for taking time to come for your Medicare Wellness Visit. I appreciate your ongoing commitment to your health goals. Please review the following plan we discussed and let me know if I can assist you in the future.   These are the goals we discussed:  Try exercising 5 days a week for at least 3 minutes Eat 6 servings of fruit and vegetables daily   Eat tree nuts ( flat on the palm of her hand ) every other day  This is a list of the screening recommended for you and due dates:  Health Maintenance  Topic Date Due  . Eye exam for diabetics  11/12/1956  . Hemoglobin A1C  10/06/2014  . Flu Shot  08/16/2015  . Complete foot exam   10/07/2015  . Urine Protein Check  10/07/2015  . Pneumonia vaccines (2 of 2 - PPSV23) 10/07/2015  . Mammogram  05/17/2016  . Tetanus Vaccine  07/15/2016  . Colon Cancer Screening  08/30/2019  . DEXA scan (bone density measurement)  Completed  . Shingles Vaccine  Completed  .  Hepatitis C: One time screening is recommended by Center for Disease Control  (CDC) for  adults born from [birth year] through 1964.   Completed

## 2014-10-07 NOTE — Progress Notes (Signed)
Name: Stacy [name]   MRN: [id number]    DOB: Mar 19, 1945   Date:10/07/2014       Progress Note  Subjective  Chief Complaint  Chief Complaint  Patient presents with  . Annual Exam    HPI  Functional ability/safety issues: No Issues Hearing issues: Addressed  Activities of daily living: Discussed Home safety issues: No Issues  End Of Life Planning: Offered verbal information regarding advanced directives, healthcare power of attorney.  Preventative care, Health maintenance, Preventative health measures discussed.  Preventative screenings discussed today: lab work, colonoscopy, PAP normal pap's all her life, last one in 56 at age 49 was also normal, mammogram is ordered by Dr. Jamal Collin, DEXA is up to date, done in 2014 .  Low Dose CT Chest recommended if Age 77-80 years, 30 pack-year currently smoking OR have quit w/in 15years.   Lifestyle risk factor issued reviewed: Diet, exercise, weight management, advised patient smoking is not healthy, nutrition/diet.  Preventative health measures discussed (5-10 year plan).  Reviewed and recommended vaccinations: - Pneumovax  - Prevnar  - Annual Influenza - Zostavax - Tdap   Depression screening: Done Fall risk screening: Done Discuss ADLs/IADLs: Done  Current medical providers: See HPI  Other health risk factors identified this visit: No other issues Cognitive impairment issues: None identified  All above discussed with patient. Appropriate education, counseling and referral will be made based upon the above.    DMII: doing well, on diet only, can't take ACE caused angioedema. Takes aspirin daily. Denies polyphagia, polydipsia or polyuria.  She is following a diabetic diet   HTN: taking diuretic, tolerating it well, takes potassium daily to avoid leg cramps. Denies chest pain, palpitation, mild lower extremity edema  Seasonal Allergies: over the past couple of days she has noticed nasal congestion, post-nasal drainage and  itchy eyes and ears. She has mild sneezing, but no cough or wheezing.   Tendinitis: she continues to have daily left heel pain and has seen Dr. Earleen Newport in the past, she will call and re-schedule an appointment. Pain is described as aching pain, worse when she wakes up, better with some ankle movement. Pain is intermittent. Pain is usually 7/10  Morbid Obesity: she has DM and HTN secondary to her weight, she has been healthy but we discussed importance of increase physical activity daily and to try to lose a few lbs.   Patient Active Problem List   Diagnosis Date Noted  . Hypertension, benign 10/07/2014  . Seasonal allergies 10/07/2014  . Diabetes mellitus type 2, diet-controlled 10/07/2014  . Morbid obesity 10/07/2014  . Vitamin D deficiency 10/07/2014  . Varicose veins 10/07/2014  . Left Achilles tendinitis 10/07/2014  . Family history of breast cancer 05/12/2012  . 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 Foot surgery  2004  . Colonoscopy  2011    DR,ISHAKIS  . Breast biopsy Left 1968  . Breast biopsy Right 2007  . Breast biopsy Right 2015    Family History  Problem Relation Age of Onset  . Breast cancer Sister 23  . Arthritis Mother   . Cancer Maternal Grandmother     Gallbladder  . Stroke Paternal Grandmother     Social History   Social History  . Marital Status: Married    Spouse Name: N/A  . Number of Children: N/A  . Years of Education: N/A   Occupational History  . Not on file.   Social  History Main Topics  . Smoking status: Never Smoker   . Smokeless tobacco: Never Used  . Alcohol Use: No  . Drug Use: No  . Sexual Activity: Not Currently     Comment: Husband has prostate issues   Other Topics Concern  . Not on file   Social History Narrative     Current outpatient prescriptions:  .  aspirin 81 MG tablet, Take 81 mg by mouth daily., Disp: , Rfl:  .  Cholecalciferol (VITAMIN D3)  2000 UNITS TABS, Take 1 tablet by mouth daily., Disp: , Rfl:  .  Cinnamon 500 MG capsule, Take 1,000 mg by mouth daily., Disp: , Rfl:  .  FISH OIL-BORAGE-FLAX-SAFFLOWER PO, Take 2 tablets by mouth daily., Disp: , Rfl:  .  POTASSIUM PO, Take 1 tablet by mouth daily., Disp: , Rfl:  .  triamterene-hydrochlorothiazide (MAXZIDE) 75-50 MG per tablet, Take 1 tablet by mouth daily., Disp: , Rfl:  .  fluticasone (FLONASE) 50 MCG/ACT nasal spray, Place 2 sprays into both nostrils daily., Disp: 16 g, Rfl: 1 .  loratadine (CLARITIN) 10 MG tablet, Take 1 tablet (10 mg total) by mouth daily., Disp: 90 tablet, Rfl: 1  Allergies  Allergen Reactions  . Altace [Ramipril] Swelling     ROS  Constitutional: Negative for fever or weight change.  Respiratory: Negative for cough and shortness of breath.   Cardiovascular: Negative for chest pain or palpitations.  Gastrointestinal: Negative for abdominal pain, no bowel changes.  Musculoskeletal: Positive  for gait problem - sometimes limps secondary to heel pain or joint swelling.  Skin: Positive  for rash under breast - mild no itching  Neurological: Negative for dizziness or headache.  No other specific complaints in a complete review of systems (except as listed in HPI above).  Objective  Filed Vitals:   10/07/14 0847  BP: 108/60  Pulse: 78  Temp: 98.2 F (36.8 C)  TempSrc: Oral  Resp: 18  Height: 5\' 6"  (1.676 m)  Weight: 188 lb 8 oz (85.503 kg)  SpO2: 96%    Body mass index is 30.44 kg/(m^2).  Physical Exam  Constitutional: Patient appears well-developed and well-nourished. No distress.  HENT: Head: Normocephalic and atraumatic. Ears: B TMs ok, no erythema or effusion; Nose: Nose normal. Mouth/Throat: Oropharynx is clear and moist. No oropharyngeal exudate.  Eyes: Conjunctivae and EOM are normal. Pupils are equal, round, and reactive to light. No scleral icterus.  Neck: Normal range of motion. Neck supple. No JVD present. No thyromegaly  present.  Cardiovascular: Normal rate, regular rhythm and normal heart sounds.  No murmur heard. trace BLE edema, varicose veins. Pulmonary/Chest: Effort normal and breath sounds normal. No respiratory distress. Abdominal: Soft. Bowel sounds are normal, no distension. There is no tenderness. no masses Breast: no lumps or masses, no nipple discharge or rashes FEMALE GENITALIA:  Not done RECTAL: not done Musculoskeletal: Normal range of motion, no joint effusions. Tender during palpation of left Achilles at the insertion at the ankle Neurological: he is alert and oriented to person, place, and time. No cranial nerve deficit. Coordination, balance, strength, speech and gait are normal.  Skin: Skin is warm and dry. Mild erythematous rash under breast not causing problems, some atypical moles but she missed follow up with Dermatologist, spider veins legs Psychiatric: Patient has a normal mood and affect. behavior is normal. Judgment and thought content normal.  Diabetic Foot Exam: Diabetic Foot Exam - Simple   Simple Foot Form  Visual Inspection  No deformities, no ulcerations,  no other skin breakdown bilaterally:  Yes  Sensation Testing  Intact to touch and monofilament testing bilaterally:  Yes  Pulse Check  Posterior Tibialis and Dorsalis pulse intact bilaterally:  Yes  Comments      PHQ2/9: Depression screen PHQ 2/9 10/07/2014  Decreased Interest 0  Down, Depressed, Hopeless 0  PHQ - 2 Score 0     Fall Risk: Fall Risk  10/07/2014  Falls in the past year? No     Functional Status Survey: Is the patient deaf or have difficulty hearing?: No Does the patient have difficulty seeing, even when wearing glasses/contacts?: Yes (glasses) Does the patient have difficulty concentrating, remembering, or making decisions?: No Does the patient have difficulty walking or climbing stairs?: No Does the patient have difficulty dressing or bathing?: No Does the patient have difficulty doing  errands alone such as visiting a doctor's office or shopping?: No    Assessment & Plan  1. Medicare annual wellness visit, subsequent  Discussed importance of 150 minutes of physical activity weekly, eat two servings of fish weekly, eat one serving of tree nuts ( cashews, pistachios, pecans, almonds.Marland Kitchen) every other day, eat 6 servings of fruit/vegetables daily and drink plenty of water and avoid sweet beverages.   2. Hypertension, benign  Doing well, recheck labs - Comprehensive metabolic panel  3. Seasonal allergies  - loratadine (CLARITIN) 10 MG tablet; Take 1 tablet (10 mg total) by mouth daily.  Dispense: 90 tablet; Refill: 1 - fluticasone (FLONASE) 50 MCG/ACT nasal spray; Place 2 sprays into both nostrils daily.  Dispense: 16 g; Refill: 1  4. Diabetes mellitus type 2, diet-controlled  - Hemoglobin A1c - POCT UA - Microalbumin  5. Morbid obesity Discussed with the patient the risk posed by an increased BMI. Discussed importance of portion control, calorie counting and at least 150 minutes of physical activity weekly. Avoid sweet beverages and drink more water. Eat at least 6 servings of fruit and vegetables daily   6. Vitamin D deficiency  - Vit D  25 hydroxy (rtn osteoporosis monitoring)  7. Left Achilles tendinitis Follow up with Dr. Earleen Newport  8. Needs flu shot  - Flu vaccine HIGH DOSE PF  9. Need for pneumococcal vaccination  - Pneumococcal conjugate vaccine 13-valent IM  10. Lipid screening  - Lipid panel

## 2014-10-08 LAB — HEMOGLOBIN A1C
ESTIMATED AVERAGE GLUCOSE: 134 mg/dL
HEMOGLOBIN A1C: 6.3 % — AB (ref 4.8–5.6)

## 2014-10-08 LAB — COMPREHENSIVE METABOLIC PANEL
ALK PHOS: 55 IU/L (ref 39–117)
ALT: 19 IU/L (ref 0–32)
AST: 22 IU/L (ref 0–40)
Albumin/Globulin Ratio: 1.3 (ref 1.1–2.5)
Albumin: 4.2 g/dL (ref 3.6–4.8)
BUN/Creatinine Ratio: 15 (ref 11–26)
BUN: 13 mg/dL (ref 8–27)
Bilirubin Total: 0.4 mg/dL (ref 0.0–1.2)
CO2: 27 mmol/L (ref 18–29)
CREATININE: 0.89 mg/dL (ref 0.57–1.00)
Calcium: 9.6 mg/dL (ref 8.7–10.3)
Chloride: 98 mmol/L (ref 97–108)
GFR calc Af Amer: 78 mL/min/{1.73_m2} (ref >59)
GFR calc non Af Amer: 67 mL/min/{1.73_m2} (ref >59)
GLOBULIN, TOTAL: 3.2 g/dL (ref 1.5–4.5)
GLUCOSE: 105 mg/dL — AB (ref 65–99)
POTASSIUM: 4.2 mmol/L (ref 3.5–5.2)
SODIUM: 140 mmol/L (ref 134–144)
Total Protein: 7.4 g/dL (ref 6.0–8.5)

## 2014-10-08 LAB — VITAMIN D 25 HYDROXY (VIT D DEFICIENCY, FRACTURES): Vit D, 25-Hydroxy: 36.6 ng/mL (ref 30.0–100.0)

## 2014-10-08 LAB — LIPID PANEL
CHOLESTEROL TOTAL: 174 mg/dL (ref 100–199)
Chol/HDL Ratio: 2.1 ratio units (ref 0.0–4.4)
HDL: 83 mg/dL (ref >39)
LDL CALC: 76 mg/dL (ref 0–99)
TRIGLYCERIDES: 74 mg/dL (ref 0–149)
VLDL Cholesterol Cal: 15 mg/dL (ref 5–40)

## 2014-10-11 NOTE — Progress Notes (Signed)
Patient notified and states she prefer to state off the cholesterol medication and keep working on diet and exercising.

## 2014-10-14 ENCOUNTER — Encounter: Payer: Self-pay | Admitting: Podiatry

## 2014-10-14 ENCOUNTER — Ambulatory Visit (INDEPENDENT_AMBULATORY_CARE_PROVIDER_SITE_OTHER): Payer: PPO

## 2014-10-14 ENCOUNTER — Ambulatory Visit (INDEPENDENT_AMBULATORY_CARE_PROVIDER_SITE_OTHER): Payer: PPO | Admitting: Podiatry

## 2014-10-14 VITALS — BP 122/71 | HR 81 | Resp 16

## 2014-10-14 DIAGNOSIS — M2570 Osteophyte, unspecified joint: Secondary | ICD-10-CM | POA: Diagnosis not present

## 2014-10-14 DIAGNOSIS — M79672 Pain in left foot: Secondary | ICD-10-CM

## 2014-10-14 DIAGNOSIS — M7662 Achilles tendinitis, left leg: Secondary | ICD-10-CM

## 2014-10-14 DIAGNOSIS — M2042 Other hammer toe(s) (acquired), left foot: Secondary | ICD-10-CM | POA: Diagnosis not present

## 2014-10-14 DIAGNOSIS — M898X7 Other specified disorders of bone, ankle and foot: Secondary | ICD-10-CM

## 2014-10-14 NOTE — Patient Instructions (Signed)
Pre-Operative Instructions  Congratulations, you have decided to take an important step to improving your quality of life.  You can be assured that the doctors of [facility] will be with you every step of the way.  1. Plan to be at the surgery center/hospital at least 1 (one) hour prior to your scheduled time unless otherwise directed by the surgical center/hospital staff.  You must have a responsible adult accompany you, remain during the surgery and drive you home.  Make sure you have directions to the surgical center/hospital and know how to get there on time. 2. For hospital based surgery you will need to obtain a history and physical form from your family physician within 1 month prior to the date of surgery- we will give you a form for you primary physician.  3. We make every effort to accommodate the date you request for surgery.  There are however, times where surgery dates or times have to be moved.  We will contact you as soon as possible if a change in schedule is required.   4. No Aspirin/Ibuprofen for one week before surgery.  If you are on aspirin, any non-steroidal anti-inflammatory medications (Mobic, Aleve, Ibuprofen) you should stop taking it 7 days prior to your surgery.  You make take Tylenol  For pain prior to surgery.  5. Medications- If you are taking daily heart and blood pressure medications, seizure, reflux, allergy, asthma, anxiety, pain or diabetes medications, make sure the surgery center/hospital is aware before the day of surgery so they may notify you which medications to take or avoid the day of surgery. 6. No food or drink after midnight the night before surgery unless directed otherwise by surgical center/hospital staff. 7. No alcoholic beverages 24 hours prior to surgery.  No smoking 24 hours prior to or 24 hours after surgery. 8. Wear loose pants or shorts- loose enough to fit over bandages, boots, and casts. 9. No slip on shoes, sneakers are best. 10. Bring  your boot with you to the surgery center/hospital.  Also bring crutches or a walker if your physician has prescribed it for you.  If you do not have this equipment, it will be provided for you after surgery. 11. If you have not been contracted by the surgery center/hospital by the day before your surgery, call to confirm the date and time of your surgery. 12. Leave-time from work may vary depending on the type of surgery you have.  Appropriate arrangements should be made prior to surgery with your employer. 13. Prescriptions will be provided immediately following surgery by your doctor.  Have these filled as soon as possible after surgery and take the medication as directed. 14. Remove nail polish on the operative foot. 15. Wash the night before surgery.  The night before surgery wash the foot and leg well with the antibacterial soap provided and water paying special attention to beneath the toenails and in between the toes.  Rinse thoroughly with water and dry well with a towel.  Perform this wash unless told not to do so by your physician.  Enclosed: 1 Ice pack (please put in freezer the night before surgery)   1 Hibiclens skin cleaner   Pre-op Instructions  If you have any questions regarding the instructions, do not hesitate to call our office.  [place]: [address] [place], [place] [address] [phone]  [place]: [address] [place], [place] [address] [phone]  [place]: [address]  [place], [place] [address] [phone]  Dr. Richard   Tuchman DPM, Dr. Norman Regal DPM Dr. Richard Sikora DPM, Dr. M. Todd Hyatt DPM, Dr. Kathryn Egerton DPM, Dr. Matthew Wagoner DPM 

## 2014-10-15 NOTE — Progress Notes (Signed)
Patient ID: Stacy [name], female   DOB: November 22, 1945, 69 y.o.   MRN: [id number]  Subjective: 69 year old phenol presents the office they for follow up evaluation of left foot symptomatic retrocalcaneal exostosis and Achilles tendinitis. She states that since last appointment her pain is in the peeling improved however she never had complete relief of symptoms. The pain is started to recur over the last several weeks and she has pain with weightbearing or pressure to the back of her heel. This has been ongoing for greater than 1 year at this point she is attended multiple conservative treatments. Should this time she is requesting surgical intervention to help decrease her pain. She also states over the last month or so she has had pain to the left second and third toes as they become contracted causing irritation. She has tried offloading padding without any relief of symptoms. No other complaints at this time in no acute changes.  Objective: AAO 3, NAD DP/PT pulses palpable, CRT less than 3 seconds; mild chronic edema with varicose veins present on the medial foot  Protective sensation intact with Simms Weinstein monofilament Along the posterior portion of the left calcaneus there is tenderness palpation overlying a prominent retrocalcaneal exostosis. There is also tenderness upon palpation of the distal aspect of the Achilles tendon along the insertion into the calcaneus. There is no discomfort along the mid substance of the tendon and no defect is noted. Thompson test is negative. There is no overlying edema, erythema, increased warmth. There is also tenderness palpation along the second and third digits along the dorsal PIPJ of the left foot. There is slight irritation from shoe gear. The toes are semirigid. No other areas of tenderness to bilateral lower extremities. No open lesions or pre-ulcerative lesions. No pain with calf compression, swelling, warmth, erythema  Assessment:  69 year old  female with continuation of pain on the left retrocalcaneal exostosis and insertional Achilles tendinitis; left second and third digit hammertoes  Plan: -Treatment options discussed including all alternatives, risks, and complications -At this time she is requesting surgical intervention to help decrease her pain and deformity for the left prominent retrocalcaneal exostosis as well as the hammertoe of the second and third digit. -The proposed surgery is left posterior heel spur resection with Achilles tendon repair and reattachment with bone anchors; second and third digit PIPJ arthrodesis with K wire fixation. -The incision placement as well as the postoperative course was discussed with the patient. I discussed risks of the surgery which include, but not limited to, infection, bleeding, pain, swelling, need for further surgery, delayed or nonhealing, painful or ugly scar, numbness or sensation changes, over/under correction, recurrence, transfer lesions, further deformity, hardware failure, DVT/PE, loss of toe/foot. Patient understands these risks and wishes to proceed with surgery. The surgical consent was reviewed with the patient all 3 pages were signed. No promises or guarantees were given to the outcome of the procedure. All questions were answered to the best of my ability. Before the surgery the patient was encouraged to call the office if there is any further questions. The surgery will be performed at the [facility] on an outpatient basis.  Celesta Gentile, DPM

## 2014-10-22 ENCOUNTER — Telehealth: Payer: Self-pay | Admitting: *Deleted

## 2014-10-22 NOTE — Telephone Encounter (Signed)
I'm calling to see if you would like to schedule surgery.  "I'll call you back.  I want to do it after Thanksgiving but I need to check with my husband to see when will be a good time."

## 2014-10-22 NOTE — Telephone Encounter (Signed)
"  I spoke with my husband and he said for me to go ahead and schedule my surgery.  I want to do it on November 30th."  Okay, I'll get it scheduled.  You should hear from the surgical center a day or 2 prior to the appointment.  They will give you the arrival time.  Go ahead and call them to get registered.  "Okay I will but not today."

## 2014-11-22 ENCOUNTER — Encounter: Payer: Self-pay | Admitting: Family Medicine

## 2014-11-22 ENCOUNTER — Ambulatory Visit (INDEPENDENT_AMBULATORY_CARE_PROVIDER_SITE_OTHER): Payer: PPO | Admitting: Family Medicine

## 2014-11-22 VITALS — BP 136/78 | HR 91 | Temp 98.3°F | Resp 16 | Ht 66.0 in | Wt 196.3 lb

## 2014-11-22 DIAGNOSIS — H9202 Otalgia, left ear: Secondary | ICD-10-CM

## 2014-11-22 DIAGNOSIS — J302 Other seasonal allergic rhinitis: Secondary | ICD-10-CM

## 2014-11-22 DIAGNOSIS — H938X1 Other specified disorders of right ear: Secondary | ICD-10-CM | POA: Diagnosis not present

## 2014-11-22 DIAGNOSIS — H6121 Impacted cerumen, right ear: Secondary | ICD-10-CM

## 2014-11-22 NOTE — Progress Notes (Signed)
Name: Stacy [name]   MRN: [id number]    DOB: 1945-12-30   Date:11/22/2014       Progress Note  Subjective  Chief Complaint  Chief Complaint  Patient presents with  . URI    Onset-Wednesday evening, unchanged symptoms, congestion, cough productive and has had a hard time getting up, left ear pressure and pain. Has not tried anything otc.    HPI  Allergic Rhinitis: she states she has noticed nasal congestion, clear to yellow drainage from her nostrils, eyes are itchy,  cough to clear her throat in am's, thick sputum. She states last week her left ear was very painful sharp sensation on left ear, but gradually improving.  She has not been using her nasal steroid, but took Loratadine last night.   Patient Active Problem List   Diagnosis Date Noted  . Hypertension, benign 10/07/2014  . Seasonal allergies 10/07/2014  . Diabetes mellitus type 2, diet-controlled ([place]) 10/07/2014  . Morbid obesity ([place]) 10/07/2014  . Vitamin D deficiency 10/07/2014  . Varicose veins 10/07/2014  . Left Achilles tendinitis 10/07/2014  . Family history of breast cancer 05/12/2012  . 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 Foot surgery  2004  . Colonoscopy  2011    DR,ISHAKIS  . Breast biopsy Left 1968  . Breast biopsy Right 2007  . Breast biopsy Right 2015    Family History  Problem Relation Age of Onset  . Breast cancer Sister 85  . Arthritis Mother   . Cancer Maternal Grandmother     Gallbladder  . Stroke Paternal Grandmother     Social History   Social History  . Marital Status: Married    Spouse Name: N/A  . Number of Children: N/A  . Years of Education: N/A   Occupational History  . Not on file.   Social History Main Topics  . Smoking status: Never Smoker   . Smokeless tobacco: Never Used  . Alcohol Use: No  . Drug Use: No  . Sexual Activity: Not Currently     Comment: Husband has prostate issues   Other  Topics Concern  . Not on file   Social History Narrative     Current outpatient prescriptions:  .  aspirin 81 MG tablet, Take 81 mg by mouth daily., Disp: , Rfl:  .  Cholecalciferol (VITAMIN D3) 2000 UNITS TABS, Take 1 tablet by mouth daily., Disp: , Rfl:  .  Cinnamon 500 MG capsule, Take 1,000 mg by mouth daily., Disp: , Rfl:  .  FISH OIL-BORAGE-FLAX-SAFFLOWER PO, Take 2 tablets by mouth daily., Disp: , Rfl:  .  fluticasone (FLONASE) 50 MCG/ACT nasal spray, Place 2 sprays into both nostrils daily., Disp: 16 g, Rfl: 1 .  loratadine (CLARITIN) 10 MG tablet, Take 1 tablet (10 mg total) by mouth daily., Disp: 90 tablet, Rfl: 1 .  POTASSIUM PO, Take 1 tablet by mouth daily., Disp: , Rfl:  .  triamterene-hydrochlorothiazide (MAXZIDE) 75-50 MG per tablet, Take 1 tablet by mouth daily., Disp: 90 tablet, Rfl: 1  Allergies  Allergen Reactions  . Altace [Ramipril] Swelling     ROS  Ten systems reviewed and is negative except as mentioned in HPI   Objective  Filed Vitals:   11/22/14 1141  BP: 136/78  Pulse: 91  Temp: 98.3 F (36.8 C)  TempSrc: Oral  Resp: 16  Height: 5\' 6"  (1.676 m)  Weight: 196 lb 4.8 oz (89.041  kg)  SpO2: 96%    Body mass index is 31.7 kg/(m^2).  Physical Exam  Constitutional: Patient appears well-developed and well-nourished. Obese  No distress.  HEENT: head atraumatic, normocephalic, pupils equal and reactive to light, ears right TM not seen, wax on ear canal , left TM normal and normal external ear canal  neck supple, throat within normal limits No tenderness during sinus palpation, boggy and pale turbinates  Cardiovascular: Normal rate, regular rhythm and normal heart sounds.  No murmur heard. Trace BLE edema. Pulmonary/Chest: Effort normal and breath sounds normal. No respiratory distress. Abdominal: Soft.  There is no tenderness. Psychiatric: Patient has a normal mood and affect. behavior is normal. Judgment and thought content normal.  Recent  Results (from the past 2160 hour(s))  Hemoglobin A1c     Status: Abnormal   Collection Time: 10/07/14 10:13 AM  Result Value Ref Range   Hgb A1c MFr Bld 6.3 (H) 4.8 - 5.6 %    Comment:          Pre-diabetes: 5.7 - 6.4          Diabetes: >6.4          Glycemic control for adults with diabetes: <7.0    Est. average glucose Bld gHb Est-mCnc 134 mg/dL  Comprehensive metabolic panel     Status: Abnormal   Collection Time: 10/07/14 10:13 AM  Result Value Ref Range   Glucose 105 (H) 65 - 99 mg/dL   BUN 13 8 - 27 mg/dL   Creatinine, Ser 0.89 0.57 - 1.00 mg/dL   GFR calc non Af Amer 67 >59 mL/min/1.73   GFR calc Af Amer 78 >59 mL/min/1.73   BUN/Creatinine Ratio 15 11 - 26   Sodium 140 134 - 144 mmol/L   Potassium 4.2 3.5 - 5.2 mmol/L   Chloride 98 97 - 108 mmol/L   CO2 27 18 - 29 mmol/L   Calcium 9.6 8.7 - 10.3 mg/dL   Total Protein 7.4 6.0 - 8.5 g/dL   Albumin 4.2 3.6 - 4.8 g/dL   Globulin, Total 3.2 1.5 - 4.5 g/dL   Albumin/Globulin Ratio 1.3 1.1 - 2.5   Bilirubin Total 0.4 0.0 - 1.2 mg/dL   Alkaline Phosphatase 55 39 - 117 IU/L   AST 22 0 - 40 IU/L   ALT 19 0 - 32 IU/L  Lipid panel     Status: None   Collection Time: 10/07/14 10:13 AM  Result Value Ref Range   Cholesterol, Total 174 100 - 199 mg/dL   Triglycerides 74 0 - 149 mg/dL   HDL 83 >39 mg/dL    Comment: According to ATP-III Guidelines, HDL-C >59 mg/dL is considered a negative risk factor for CHD.    VLDL Cholesterol Cal 15 5 - 40 mg/dL   LDL Calculated 76 0 - 99 mg/dL   Chol/HDL Ratio 2.1 0.0 - 4.4 ratio units    Comment:                                   T. Chol/HDL Ratio                                             Men  Women  1/2 Avg.Risk  3.4    3.3                                   Avg.Risk  5.0    4.4                                2X Avg.Risk  9.6    7.1                                3X Avg.Risk 23.4   11.0   Vit D  25 hydroxy (rtn osteoporosis monitoring)     Status: None    Collection Time: 10/07/14 10:13 AM  Result Value Ref Range   Vit D, 25-Hydroxy 36.6 30.0 - 100.0 ng/mL    Comment: Vitamin D deficiency has been defined by the [place] practice guideline as a level of serum 25-OH vitamin D less than 20 ng/mL (1,2). The Endocrine Society went on to further define vitamin D insufficiency as a level between 21 and 29 ng/mL (2). 1. IOM (Institute of Medicine). 2010. Dietary reference    intakes for calcium and D. [place]: The    [employer]. 2. Holick MF, Binkley Newell, Bischoff-Ferrari HA, et al.    Evaluation, treatment, and prevention of vitamin D    deficiency: an Endocrine Society clinical practice    guideline. JCEM. 2011 Jul; 96(7):1911-30.     PHQ2/9: Depression screen PHQ 2/9 10/07/2014  Decreased Interest 0  Down, Depressed, Hopeless 0  PHQ - 2 Score 0    Fall Risk: Fall Risk  10/07/2014  Falls in the past year? No     Assessment & Plan  1. Otalgia, left  Likely from eustachian tube dysfunction, resume nasal spray, normal ear exam  2. Seasonal allergies  Resume Fluticasone daily, may also use saline spray to help with symptoms  3. Ear fullness, right  - Ear cerumen removal  4. Cerumen impaction, right  - Ear cerumen removal

## 2014-12-14 ENCOUNTER — Telehealth: Payer: Self-pay | Admitting: *Deleted

## 2014-12-14 NOTE — Telephone Encounter (Signed)
Patient returned my call.  I explained the problem and apologized for my error.  "My surgery was scheduled about 3 months ago.  This should have already been taken care of."  I'm sorry, I wasn't aware that it needed to be authorized.  He hammer toe procedure does not and the Tenolysis.  I'll check first thing in the morning and give you a call.  I had them to move you down so it wouldn't be canceled.  I am so sorry.

## 2014-12-14 NOTE — Telephone Encounter (Signed)
I called [facility] to check on the status of Pre-certification.  Stacy [name] stated it is still pending.  The pending authorization number is [id number].

## 2014-12-14 NOTE — Telephone Encounter (Signed)
I called and spoke to [place] at [facility].  She stated the case is still pending.    I attempted to call patient to inform her that surgery has not been authorized.  I will check it again in the morning to see if it has been authorized.

## 2014-12-15 ENCOUNTER — Other Ambulatory Visit: Payer: Self-pay | Admitting: Podiatry

## 2014-12-15 DIAGNOSIS — Z9889 Other specified postprocedural states: Secondary | ICD-10-CM

## 2014-12-15 DIAGNOSIS — Q664 Congenital talipes calcaneovalgus: Secondary | ICD-10-CM | POA: Diagnosis not present

## 2014-12-15 DIAGNOSIS — M2042 Other hammer toe(s) (acquired), left foot: Secondary | ICD-10-CM | POA: Diagnosis not present

## 2014-12-15 DIAGNOSIS — M65879 Other synovitis and tenosynovitis, unspecified ankle and foot: Secondary | ICD-10-CM | POA: Diagnosis not present

## 2014-12-15 HISTORY — PX: HEEL SPUR SURGERY: SHX665

## 2014-12-15 NOTE — Telephone Encounter (Signed)
I called [facility] to check on the status of authorization request for patient's surgery for today.  [place] informed me it had been authorized.  The authorization number is [id number].  I called and informed Caren Griffins at the surgical center that surgery was authorized and gave her authorization number.  I called patient and informed her that surgery was authorized.  "Okay, I just got the call from them.  I have to be there at 9:45am.  I'm putting my clothes on now.  We may be a little late because we live a little while away."  Okay, I'll let them know.  I called and informed Caren Griffins at [facility].

## 2014-12-16 ENCOUNTER — Telehealth: Payer: Self-pay | Admitting: *Deleted

## 2014-12-16 NOTE — Telephone Encounter (Addendum)
Pt's husband, Stacy [name] states pt is having trouble getting around in the house after surgery 12/15/2014.  Pt's husband states pt would like to have the knee scooter.  I asked for the name and phone of medical supply centers near them to call and order the knee scooter.  I also informed Stacy [name] that insurance may not cover the rental.  I ordered a knee scooter from [facility] was informed.  Stacy [name] states he was going to get the crutches because they may be covered by insurance.  I told him to call me with the name and fax of the facility for the rental, and I would fax an order.  Stacy [name] called with [employer] [phone], and request a call back when message received.  12/17/2014 Stacy [name] called states he has decided they want the knee scooter, [facility] [phone], fax [phone].  I left message informing [place] that the pt's husband wanted the knee scooter that was ordered yesterday, but not picked up and I would fax the order for the knee scooter to [phone].

## 2014-12-21 ENCOUNTER — Ambulatory Visit (INDEPENDENT_AMBULATORY_CARE_PROVIDER_SITE_OTHER): Payer: PPO | Admitting: Podiatry

## 2014-12-21 ENCOUNTER — Ambulatory Visit (INDEPENDENT_AMBULATORY_CARE_PROVIDER_SITE_OTHER): Payer: PPO

## 2014-12-21 ENCOUNTER — Encounter: Payer: Self-pay | Admitting: Podiatry

## 2014-12-21 DIAGNOSIS — M2570 Osteophyte, unspecified joint: Secondary | ICD-10-CM

## 2014-12-21 DIAGNOSIS — M2042 Other hammer toe(s) (acquired), left foot: Secondary | ICD-10-CM

## 2014-12-21 DIAGNOSIS — M898X7 Other specified disorders of bone, ankle and foot: Secondary | ICD-10-CM

## 2014-12-21 DIAGNOSIS — Z9889 Other specified postprocedural states: Secondary | ICD-10-CM

## 2014-12-21 NOTE — Patient Instructions (Signed)
Venous Thromboembolism Prevention Venous thromboembolism (VTE) is a condition in which a blood clot (thrombus) develops in the body. A thrombus usually occurs in a deep vein in the leg or the pelvis (DVT), but it can also occur in the arm. Sometimes, pieces of a thrombus can break off from its original place of development and travel through the bloodstream to other parts of the body. When that happens, the thrombus is called an embolus. An embolus that travels to one or both lungs is called a pulmonary embolism. An embolism can block the blood flow in the blood vessels of other organs as well. VTE is a serious health condition that can cause disability or death. It is very important to get help right away and to not ignore symptoms. HOW CAN A VTE BE PREVENTED?  Exercise regularly. Take a brisk 30 minute walk every day. Staying active and moving around can help you to prevent blood clots.  Avoid sitting or lying in bed for long periods of time without moving your legs. Change your position often, especially during long-distance travel (over 4 hours).  If you are a woman who is over 68 years of age, avoid unnecessary use of medicines that contain estrogen. These include birth control pills and hormone replacement therapy.  Do not smoke, especially if you take estrogen medicines. If you need help quitting, ask your health care provider.  Eat plenty of fruits and vegetables. Ask your health care provider or dietitian if there are foods that you should avoid.  Maintain a weight that is appropriate for your height. Ask your health care provider what weight is healthy for you.  Wear loose-fitting clothing. Avoid constrictive or tight clothing around your legs or waist.  Try not to bump or injure your legs. Avoid crossing your legs when you are sitting.  Do not use pillows under your knees while lying down unless told by your health care provider.  Wear support hose (compression stockings or TED  hose) as told by your health care provider Compression stockings increase blood flow in your legs and can help prevent blood clots. Do not let them bunch up when you are wearing them. HOW CAN I PREVENT VTE WHEN I TRAVEL? Long-distance travel (over 4 hours) can increase the risk of a VTE. To prevent VTE when traveling:  Exercise your legs every hour by standing, stretching, and bending and straightening your legs. If you are traveling by airplane, train, or bus, walk up and down the aisle as often as possible to get your blood moving. If you are traveling by car, stop and get out of the car every hour to exercise your legs and stretch. Other types of exercise might include:  Keeping your feet flat on the ground and raising your toes.  Switching from tightening the muscles in your calves and thighs to relaxing those same muscles while you are sitting.  Pointing and flexing your feet at the ankle joints while you are sitting.  Stay well hydrated while traveling. Drink enough water to keep your urine clear or pale yellow.  Avoid drinking alcohol during long travel. Generally, it is not recommended that you take medicines to prevent DVT during routine travel. HOW CAN VTE BE PREVENTED IF I AM HOSPITALIZED? A VTE may be prevented by taking medicines that are prescribed to prevent blood clots (anticoagulants). You can also help to prevent VTE while in the hospital by taking these actions:  Get out of bed and walk. Ask your health care provider  if this is safe for you to do.  Request the use of a sequential compression device (SCD). This is a machine that pumps air into compression sleeves that are wrapped around your legs.  Request the use of compression stockings, which are tight, elastic stockings that apply pressure to the lower legs. Compression stockings are sometimes used with SCDs. HOW CAN I PREVENT VTE AFTER SURGERY? Understand that there is an increased risk for VTE for the first 4-6 weeks  after surgery. During this time:  Avoid long-distance travel (over 4 hours). If you must travel during this time, ask your health care provider about additional preventive actions that you can take. These might include exercising your arms and legs every hour while you travel.  Avoid sitting or lying still for too long. If possible, get up and walk around one time every hour. Ask your health care provider when this is safe for you to do. SEEK IMMEDIATE MEDICAL CARE IF:  You have new or increased pain, swelling, or redness in an arm or leg.  You have numbness or tingling in an arm or leg.  You have shortness of breath while active or at rest.  You have chest pain.  You have a rapid or irregular heartbeat.  You feel light-headed or dizzy.  You cough up blood.  You notice blood in your vomit, bowel movement, or urine. These symptoms may represent a serious problem that is an emergency. Do not wait to see if the symptoms will go away. Get medical help right away. Call your local emergency services (911 in the [place] Do not drive yourself to the hospital.   This information is not intended to replace advice given to you by your health care provider. Make sure you discuss any questions you have with your health care provider.   Document Released: 12/20/2008 Document Revised: 09/22/2014 Document Reviewed: 04/28/2014 Elsevier Interactive Patient Education 2016 Elsevier Inc.  

## 2014-12-22 NOTE — Progress Notes (Signed)
Patient ID: Stacy [name], female   DOB: 11-May-1945, 69 y.o.   MRN: [id number]  Subjective: Stacy [name] is a 69 y.o. is seen today in office s/p left retrocalcaneal exostecotmy, tenolysis, hammertoe repair preformed on 12-15-14. She states that overall she is doing well she is having very minimal pain. She has tried remain nonweightbearing as much as possible. She's been continuing aspirin as she does not other DVT prophylaxis. Denies any systemic complaints such as fevers, chills, nausea, vomiting. No calf pain, chest pain, shortness of breath.   Objective: No acute distress, AAOx3  Cast is clean, dry, intact. She states this female is not rubbing and causing the problems. Motor function intact the digits as well as capillary refill time to all the toes. There is no pain with calf compression, swelling, warmth, erythema. K wires intact the toes without any redness or drainage. No other areas of tenderness to bilateral lower extremities.  No other open lesions or pre-ulcerative lesions.  No pain with calf compression, swelling, warmth, erythema.   Assessment and Plan:  Status post left foot surgery doing well with no complications   -Treatment options discussed including all alternatives, risks, and complications -X-rays were obtained and reviewed with the patient.  -Ice/elevation -Pain medication as needed. -Continue nonweightbearing. She is in the process of getting a rolling knee scooter. She has a walker for now. -Monitor for any clinical signs or symptoms of infection and DVT/PE and directed to call the office immediately should any occur or go to the ER. -Follow-up in 1 week for cast change and possible suture/staple removal or sooner if any problems arise. In the meantime, encouraged to call the office with any questions, concerns, change in symptoms.   Celesta Gentile, DPM

## 2014-12-28 ENCOUNTER — Encounter: Payer: Self-pay | Admitting: Podiatry

## 2014-12-28 ENCOUNTER — Ambulatory Visit (INDEPENDENT_AMBULATORY_CARE_PROVIDER_SITE_OTHER): Payer: PPO | Admitting: Podiatry

## 2014-12-28 DIAGNOSIS — Z9889 Other specified postprocedural states: Secondary | ICD-10-CM

## 2014-12-28 DIAGNOSIS — M898X7 Other specified disorders of bone, ankle and foot: Secondary | ICD-10-CM

## 2014-12-28 DIAGNOSIS — M2042 Other hammer toe(s) (acquired), left foot: Secondary | ICD-10-CM

## 2014-12-28 DIAGNOSIS — M2570 Osteophyte, unspecified joint: Secondary | ICD-10-CM

## 2014-12-29 DIAGNOSIS — M898X7 Other specified disorders of bone, ankle and foot: Secondary | ICD-10-CM | POA: Insufficient documentation

## 2014-12-29 NOTE — Progress Notes (Signed)
Patient ID: Avarey Wildt, female   DOB: [birth year], 69 y.o.   MRN: [id number]  Subjective: Aryal Jacobowitz is a 69 y.o. is seen today in office s/p left retrocalcaneal exostecotmy, tenolysis, hammertoe repair preformed on 12-15-14. She states that overall she is doing well she is having very minimal pain. She has tried remain nonweightbearing as much as possible but she does put some weight on the foot for balance. She's been continuing aspirin as she does not desire other DVT prophylaxis. Denies any systemic complaints such as fevers, chills, nausea, vomiting. No calf pain, chest pain, shortness of breath.   Objective: No acute distress, AAOx3  Cast is clean, dry, intact with mild wear on the bottom.  Left foot: After the cast was removed the foot was evaluated. DP/PT pulses are palpable, CRT less than 3 seconds. Protective sensation intact. Motor function intact. Range of motion intact the ankle in both active and passive range of motion. Incisions are both well coapted without any evidence of dehiscence and sutures are intact along the digits as well as the posterior Achilles tendon. There is no evidence of dehiscence. There is no swelling erythema, ascending cellulitis, fluctuance, crepitus, malodor, drainage/purulence. There is minimal tenderness to palpation on the surgical site. There is minimal edema. Thompson test is negative. No areas of tenderness to bilateral lower extremities otherwise. No other areas of tenderness to bilateral lower extremities.  No other open lesions or pre-ulcerative lesions.  No pain with calf compression, swelling, warmth, erythema.   Assessment and Plan:  Status post left foot surgery doing well with no complications   -Treatment options discussed including all alternatives, risks, and complications -X-rays appointment she was placed into a cam boot at her request.  -Today her sutures are not quite ready to come out as there is minimal movement of the  incision. We'll leave the sutures and staples intact until next visit next week and will root unlikely then. -Ice/elevation -Pain medication as needed. -Continue nonweightbearing. She has a rolling knee scooter. -Monitor for any clinical signs or symptoms of infection and DVT/PE and directed to call the office immediately should any occur or go to the ER. -Follow-up in 1 week for possible suture/staple removal or sooner if any problems arise. In the meantime, encouraged to call the office with any questions, concerns, change in symptoms.   Celesta Gentile, DPM

## 2015-01-06 ENCOUNTER — Ambulatory Visit (INDEPENDENT_AMBULATORY_CARE_PROVIDER_SITE_OTHER): Payer: PPO | Admitting: Podiatry

## 2015-01-06 ENCOUNTER — Encounter: Payer: Self-pay | Admitting: Podiatry

## 2015-01-06 ENCOUNTER — Ambulatory Visit (INDEPENDENT_AMBULATORY_CARE_PROVIDER_SITE_OTHER): Payer: PPO

## 2015-01-06 VITALS — BP 127/77 | HR 76 | Resp 12

## 2015-01-06 DIAGNOSIS — M2042 Other hammer toe(s) (acquired), left foot: Secondary | ICD-10-CM

## 2015-01-06 DIAGNOSIS — Z9889 Other specified postprocedural states: Secondary | ICD-10-CM

## 2015-01-06 DIAGNOSIS — M2570 Osteophyte, unspecified joint: Secondary | ICD-10-CM

## 2015-01-06 DIAGNOSIS — M898X7 Other specified disorders of bone, ankle and foot: Secondary | ICD-10-CM

## 2015-01-06 NOTE — Progress Notes (Signed)
Patient ID: Stacy [name], female   DOB: 07-07-1945, 69 y.o.   MRN: [id number]  Subjective: Stacy [name] is a 69 y.o. is seen today in office s/p left retrocalcaneal exostecotmy, tenolysis, hammertoe repair preformed on 12-15-14. She presents today for suture and staple removal.  She states that she has been nonweightbearing and using the boot at all times. She states her pain is minimal. She is not taking pain medicine. Denies any systemic complaints such as fevers, chills, nausea, vomiting. No calf pain, chest pain, shortness of breath.   Objective: No acute distress, AAOx3  Cam boot intact Left foot: DP/PT pulses are palpable, CRT less than 3 seconds. Protective sensation intact. Motor function intact. Range of motion intact the ankle in both active and passive range of motion. Incisions are all well coapted without any evidence of dehiscence and sutures are intact along the digits as well as the posterior Achilles tendon. There is no evidence of dehiscence. There is no  Surrounding erythema, ascending cellulitis, fluctuance, crepitus, malodor, drainage/purulence. There is minimal tenderness to palpation on the surgical site. There is minimal edema. Thompson test is negative. No areas of tenderness to bilateral lower extremities otherwise. K wires intact the second and third toes without any evidence of redness or drainage. No other areas of tenderness to bilateral lower extremities.  No other open lesions or pre-ulcerative lesions.  No pain with calf compression, swelling, warmth, erythema.   Assessment and Plan:  Status post left foot surgery doing well with no complications   -Treatment options discussed including all alternatives, risks, and complications -X-rays appointment she was placed into a cam boot at her request.  -Sutures and staples removed today without complications. Antibiotic ointments place followed by dry sterile dressing. Keep the dressing clean, dry,  intact. -Ice/elevation -Pain medication as needed. -Continue nonweightbearing. She has a rolling knee scooter. -Gentle ankle range of motion exercises. -Monitor for any clinical signs or symptoms of infection and DVT/PE and directed to call the office immediately should any occur or go to the ER. -Follow-up in 2 weeks  or sooner if any problems arise. In the meantime, encouraged to call the office with any questions, concerns, change in symptoms.   Celesta Gentile, DPM

## 2015-01-20 ENCOUNTER — Ambulatory Visit (INDEPENDENT_AMBULATORY_CARE_PROVIDER_SITE_OTHER): Payer: PPO | Admitting: Podiatry

## 2015-01-20 ENCOUNTER — Encounter: Payer: Self-pay | Admitting: Podiatry

## 2015-01-20 ENCOUNTER — Ambulatory Visit (INDEPENDENT_AMBULATORY_CARE_PROVIDER_SITE_OTHER): Payer: PPO

## 2015-01-20 DIAGNOSIS — M2042 Other hammer toe(s) (acquired), left foot: Secondary | ICD-10-CM

## 2015-01-20 DIAGNOSIS — M2570 Osteophyte, unspecified joint: Secondary | ICD-10-CM

## 2015-01-20 DIAGNOSIS — Z9889 Other specified postprocedural states: Secondary | ICD-10-CM

## 2015-01-20 DIAGNOSIS — M898X7 Other specified disorders of bone, ankle and foot: Secondary | ICD-10-CM

## 2015-01-21 DIAGNOSIS — M7731 Calcaneal spur, right foot: Secondary | ICD-10-CM | POA: Diagnosis not present

## 2015-01-21 NOTE — Progress Notes (Signed)
Patient ID: Stacy [name], female   DOB: Jun 22, 1945, 70 y.o.   MRN: [id number]  Subjective: Stacy [name] is a 70 y.o. is seen today in office s/p left retrocalcaneal exostecotmy, tenolysis, hammertoe repair preformed on 12-15-14. Since last appointment she states that she is doing well and she is not having any pain. She has come out of the boot some to start range of motion exercises for which she can do without any pain. She is not taking pain medicine. Denies any systemic complaints such as fevers, chills, nausea, vomiting. No calf pain, chest pain, shortness of breath.   Objective: No acute distress, AAOx3  Cam boot intact Left foot: DP/PT pulses are palpable, CRT less than 3 seconds. Protective sensation intact. Motor function intact. Range of motion intact the ankle in both active and passive range of motion. Incisions are all well coapted without any evidence of dehiscence to all surgical sites. There is no evidence of dehiscence. There is no surrounding erythema, ascending cellulitis, fluctuance, crepitus, malodor, drainage/purulence. There is currently no tenderness to palpation on the surgical site. There is minimal edema. Thompson test is negative. No areas of tenderness to bilateral lower extremities otherwise. K wires intact the second and third toes without any evidence of redness or drainage. No other areas of tenderness to bilateral lower extremities.  No other open lesions or pre-ulcerative lesions.  No pain with calf compression, swelling, warmth, erythema.   Assessment and Plan:  Status post left foot surgery doing well with no complications   -Treatment options discussed including all alternatives, risks, and complications -X-rays obtained.  -Today the K wires removed without complications in total after the area was cleaned. And about appointment a bandage was applied. She can start to shower tomorrow is long as all incisions and the pin sites are closed. -Dispense Darco  shoe and she'll start to weight-bear as tolerated in the shoe around the house but she is going for longer distances she is to wear the CAM boot. Continue range of motion, rehabilitation exercises. -Monitor for any clinical signs or symptoms of infection and DVT/PE and directed to call the office immediately should any occur or go to the ER. -Follow-up in 3 weeks  or sooner if any problems arise. In the meantime, encouraged to call the office with any questions, concerns, change in symptoms.   Celesta Gentile, DPM

## 2015-01-24 ENCOUNTER — Telehealth: Payer: Self-pay | Admitting: *Deleted

## 2015-01-24 NOTE — Telephone Encounter (Addendum)
[place] spoke with Santiago Glad, she states rx to be faxed for Lidocaine ointment and Diclofenac gel.  I asked Santiago Glad to refax to my machine [phone].  Dr. Jacqualyn Posey ordered Diclofenac gel apply 2 grams to affected area 4 times daily +11refills, and Lidocaine 5% ointment apply 2 grams to affected area 4 times daily +11 refills.  Faxed to [facility].

## 2015-01-25 MED ORDER — DICLOFENAC SODIUM 1 % TD GEL: 2.0000 g | Freq: Four times a day (QID) | TRANSDERMAL | Status: DC

## 2015-01-25 MED ORDER — LIDOCAINE 5 % EX OINT: 1.0000 "application " | TOPICAL_OINTMENT | CUTANEOUS | Status: DC | PRN

## 2015-02-03 ENCOUNTER — Encounter: Payer: Self-pay | Admitting: Podiatry

## 2015-02-03 ENCOUNTER — Ambulatory Visit (INDEPENDENT_AMBULATORY_CARE_PROVIDER_SITE_OTHER): Payer: PPO | Admitting: Podiatry

## 2015-02-03 DIAGNOSIS — Z9889 Other specified postprocedural states: Secondary | ICD-10-CM | POA: Diagnosis not present

## 2015-02-03 DIAGNOSIS — M898X7 Other specified disorders of bone, ankle and foot: Secondary | ICD-10-CM

## 2015-02-03 DIAGNOSIS — M2042 Other hammer toe(s) (acquired), left foot: Secondary | ICD-10-CM

## 2015-02-03 DIAGNOSIS — R609 Edema, unspecified: Secondary | ICD-10-CM | POA: Diagnosis not present

## 2015-02-03 DIAGNOSIS — M2570 Osteophyte, unspecified joint: Secondary | ICD-10-CM

## 2015-02-03 NOTE — Progress Notes (Signed)
Patient ID: Stacy [name], female   DOB: 1945/03/16, 70 y.o.   MRN: [id number]  Subjective: Stacy [name] is a 70 y.o. is seen today in office s/p left retrocalcaneal exostecotmy, tenolysis, hammertoe repair preformed on 12-15-14. Since last appointment she states that she is doing well and she is not having any pain. She has remained in the surgical shoe. She's had some occasional discomfort however it does appear to be improving. She is an occasional swelling as well. She did try on a regular tennis shoe which she was able to wear but she did not ambulate in it but it was fitting well. No tingling or numbness. Denies any systemic complaints such as fevers, chills, nausea, vomiting. No calf pain, chest pain, shortness of breath.   Objective: No acute distress, AAOx3  Cam boot intact Left foot: DP/PT pulses are palpable, CRT less than 3 seconds. Protective sensation intact. Motor function intact. Range of motion intact the ankle in both active and passive range of motion. Incisions are all well coapted without any evidence of dehiscence to all surgical sites and scar has formed. There is no evidence of dehiscence. There is no surrounding erythema, ascending cellulitis, fluctuance, crepitus, malodor, drainage/purulence. There is currently no tenderness to palpation on the surgical site. There is minimal edema. Thompson test is negative. No areas of tenderness to bilateral lower extremities otherwise.  No other areas of tenderness to bilateral lower extremities.  No other open lesions or pre-ulcerative lesions.  No pain with calf compression, swelling, warmth, erythema.   Assessment and Plan:  Status post left foot surgery doing well with no complications   -Treatment options discussed including all alternatives, risks, and complications -She can start to transition to a regular shoe as tolerated after the Unna boot is removed. In the boot was applied today to help with the mild swelling. Also  continue his range of motion, stretching exercises daily. It is a increase in pain to decrease the activity. -Discussed physical therapy but we'll see how she is doing with transitioning on her. -Monitor for any clinical signs or symptoms of infection and DVT/PE and directed to call the office immediately should any occur or go to the ER. -Follow-up in 4 weeks  or sooner if any problems arise. In the meantime, encouraged to call the office with any questions, concerns, change in symptoms.  *x-ray next appointment   Celesta Gentile, DPM

## 2015-02-03 NOTE — Patient Instructions (Signed)
Remove the unna boot (soft cast) in 5 days unless there is an increase in swelling, pain, toe discoloration or any issues. If they occur go ahead and cut the unna boot off and call the office Monitor for any signs/symptoms of infection. Call the office immediately if any occur or go directly to the emergency room. Call with any questions/concerns.

## 2015-03-03 ENCOUNTER — Ambulatory Visit (INDEPENDENT_AMBULATORY_CARE_PROVIDER_SITE_OTHER): Payer: PPO

## 2015-03-03 ENCOUNTER — Ambulatory Visit (INDEPENDENT_AMBULATORY_CARE_PROVIDER_SITE_OTHER): Payer: PPO | Admitting: Podiatry

## 2015-03-03 ENCOUNTER — Encounter: Payer: Self-pay | Admitting: Podiatry

## 2015-03-03 DIAGNOSIS — M2042 Other hammer toe(s) (acquired), left foot: Secondary | ICD-10-CM

## 2015-03-03 DIAGNOSIS — M2570 Osteophyte, unspecified joint: Secondary | ICD-10-CM

## 2015-03-03 DIAGNOSIS — M898X7 Other specified disorders of bone, ankle and foot: Secondary | ICD-10-CM

## 2015-03-03 DIAGNOSIS — Z9889 Other specified postprocedural states: Secondary | ICD-10-CM

## 2015-03-04 NOTE — Progress Notes (Signed)
Patient ID: Stacy [name], female   DOB: 1945/08/22, 70 y.o.   MRN: [id number]  Subjective: Stacy [name] is a 70 y.o. is seen today in office s/p left retrocalcaneal exostecotmy, tenolysis, hammertoe repair preformed on 12-15-14. Since last upon a she is transitioned back to regular shoe full time. She states that she gets some occasional sharp pains to her surgical site along the Achilles tendon. She points to the medial side adjacent to the Achilles tendon with the majority of her pain is at. She states that she has intermittent swelling but denies any redness or warmth. She states that her toes are doing well. No other complaints. Denies any systemic complaints such as fevers, chills, nausea, vomiting. No calf pain, chest pain, shortness of breath.   Objective: No acute distress, AAOx3  Cam boot intact Left foot: DP/PT pulses are palpable, CRT less than 3 seconds. Protective sensation intact. Motor function intact. Range of motion intact the ankle in both active and passive range of motion. Incisions are all well coapted without any evidence of dehiscence to all surgical sites and scar has well formed.  There is no surrounding erythema, ascending cellulitis, fluctuance, crepitus, malodor, drainage/purulence. There is currently mild tenderness to palpation adjacent to the Achilles site on the medial and lateral aspect overlying the area of mild edema. There is no erythema or increase in warmth. Thompson test is negative. No other areas of tenderness to bilateral lower extremities otherwise. Mild edema to the toes and they sit in a rectus position.  No other open lesions or pre-ulcerative lesions.  No pain with calf compression, swelling, warmth, erythema.   Assessment and Plan:  Status post left foot surgery with mild pain   -Treatment options discussed including all alternatives, risks, and complications -X-rays were obtained and reviewed with the patient.  -Continue with regular supportive  shoe gear. Continue stretching exercises. We'll order physical therapy. This was provided today. -Continue ice and elevation. -Monitor for any clinical signs or symptoms of infection and DVT/PE and directed to call the office immediately should any occur or go to the ER. -Follow-up after PT  or sooner if any problems arise. In the meantime, encouraged to call the office with any questions, concerns, change in symptoms.   Celesta Gentile, DPM

## 2015-03-10 ENCOUNTER — Other Ambulatory Visit: Payer: Self-pay | Admitting: *Deleted

## 2015-03-10 DIAGNOSIS — D4860 Neoplasm of uncertain behavior of unspecified breast: Secondary | ICD-10-CM

## 2015-03-16 ENCOUNTER — Other Ambulatory Visit: Payer: Self-pay | Admitting: *Deleted

## 2015-03-16 NOTE — Addendum Note (Signed)
Addended by: Verlene Mayer A on: 03/16/2015 01:55 PM   Modules accepted: Orders

## 2015-03-29 ENCOUNTER — Encounter: Payer: Self-pay | Admitting: *Deleted

## 2015-04-06 ENCOUNTER — Encounter: Payer: Self-pay | Admitting: Family Medicine

## 2015-04-06 ENCOUNTER — Ambulatory Visit (INDEPENDENT_AMBULATORY_CARE_PROVIDER_SITE_OTHER): Payer: PPO | Admitting: Family Medicine

## 2015-04-06 VITALS — BP 126/72 | HR 83 | Temp 98.0°F | Resp 16 | Ht 66.0 in | Wt 185.1 lb

## 2015-04-06 DIAGNOSIS — I10 Essential (primary) hypertension: Secondary | ICD-10-CM | POA: Diagnosis not present

## 2015-04-06 DIAGNOSIS — J302 Other seasonal allergic rhinitis: Secondary | ICD-10-CM

## 2015-04-06 DIAGNOSIS — R634 Abnormal weight loss: Secondary | ICD-10-CM

## 2015-04-06 DIAGNOSIS — E559 Vitamin D deficiency, unspecified: Secondary | ICD-10-CM | POA: Diagnosis not present

## 2015-04-06 DIAGNOSIS — E119 Type 2 diabetes mellitus without complications: Secondary | ICD-10-CM | POA: Diagnosis not present

## 2015-04-06 MED ORDER — TRIAMTERENE-HCTZ 75-50 MG PO TABS: 1.0000 | ORAL_TABLET | Freq: Every day | ORAL | Status: DC

## 2015-04-06 NOTE — Progress Notes (Signed)
Name: Stacy [name]   MRN: [id number]    DOB: [birth year]   Date:04/06/2015       Progress Note  Subjective  Chief Complaint  Chief Complaint  Patient presents with  . Medication Refill  . Hypertension  . Allergic Rhinitis     HPI  DMII: doing well, on diet only, can't take ACE caused angioedema. Takes aspirin daily. Denies polyphagia, polydipsia or polyuria. She is following a diabetic diet. She has lost 11 lbs, but no change in her diet, she states she had a viral illness a few weeks ago but is eating normally again.   HTN: taking diuretic, tolerating it well, takes potassium daily to avoid leg cramps. Denies chest pain, palpitation or SOB, she has  mild lower extremity edema.   Seasonal Allergies:doing well at this time, no cough, sneezing or wheezing, but started to clear her throat.   Tendinitis: she continues to have left heel pain, but is doing better, had foot surgery in November, but achilles tendon pain intermittent  Morbid Obesity: she has DM and HTN secondary to her weight, she has been healthy. She states she had an URI a few weeks ago and did not eat for a few days, she has lost 11 lbs since last visit in November, she denies SOB, cough, joint aches, no change in bowel movements. She has follow for mammogram with Dr. Jamal Collin, we will check labs and advised to monitor weight at home and return sooner if continues to lose weight.   Patient Active Problem List   Diagnosis Date Noted  . Hammertoe 12/29/2014  . Retrocalcaneal exostosis 12/29/2014  . Hypertension, benign 10/07/2014  . Seasonal allergies 10/07/2014  . Diabetes mellitus type 2, diet-controlled ([place]) 10/07/2014  . Morbid obesity ([place]) 10/07/2014  . Vitamin D deficiency 10/07/2014  . Varicose veins 10/07/2014  . Left Achilles tendinitis 10/07/2014  . Family history of breast cancer 05/12/2012  . 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  Foot surgery  2004  . Colonoscopy  2011    DR,ISHAKIS  . Breast biopsy Left 1968  . Breast biopsy Right 2007  . Breast biopsy Right 2015    Family History  Problem Relation Age of Onset  . Breast cancer Sister 85  . Arthritis Mother   . Cancer Maternal Grandmother     Gallbladder  . Stroke Paternal Grandmother     Social History   Social History  . Marital Status: Married    Spouse Name: N/A  . Number of Children: N/A  . Years of Education: N/A   Occupational History  . Not on file.   Social History Main Topics  . Smoking status: Never Smoker   . Smokeless tobacco: Never Used  . Alcohol Use: No  . Drug Use: No  . Sexual Activity: Not Currently     Comment: Husband has prostate issues   Other Topics Concern  . Not on file   Social History Narrative     Current outpatient prescriptions:  .  aspirin 81 MG tablet, Take 81 mg by mouth daily., Disp: , Rfl:  .  Cholecalciferol (VITAMIN D3) 2000 UNITS TABS, Take 1 tablet by mouth daily., Disp: , Rfl:  .  Cinnamon 500 MG capsule, Take 1,000 mg by mouth daily., Disp: , Rfl:  .  diclofenac sodium (VOLTAREN) 1 % GEL, Apply 2 g topically 4 (four) times daily., Disp: 1 Tube, Rfl: 11 .  FISH OIL-BORAGE-FLAX-SAFFLOWER PO, Take 2 tablets by mouth daily., Disp: , Rfl:  .  fluticasone (FLONASE) 50 MCG/ACT nasal spray, Place 2 sprays into both nostrils daily., Disp: 16 g, Rfl: 1 .  lidocaine (XYLOCAINE) 5 % ointment, Apply 1 application topically as needed., Disp: 35.44 g, Rfl: 11 .  loratadine (CLARITIN) 10 MG tablet, Take 1 tablet (10 mg total) by mouth daily., Disp: 90 tablet, Rfl: 1 .  POTASSIUM PO, Take 1 tablet by mouth daily., Disp: , Rfl:  .  triamterene-hydrochlorothiazide (MAXZIDE) 75-50 MG per tablet, Take 1 tablet by mouth daily., Disp: 90 tablet, Rfl: 1  Allergies  Allergen Reactions  . Altace [Ramipril] Swelling     ROS  Constitutional: Negative for fever , positive for weight change.  Respiratory: Negative  for cough and shortness of breath.   Cardiovascular: Negative for chest pain or palpitations.  Gastrointestinal: Negative for abdominal pain, no bowel changes.  Musculoskeletal: Positive  for gait problem ( sometimes using a cane since foot surgery ) or joint swelling.  Skin: Negative for rash.  Neurological: Negative for dizziness or headache.  No other specific complaints in a complete review of systems (except as listed in HPI above).  Objective  Filed Vitals:   04/06/15 0814  BP: 126/72  Pulse: 83  Temp: 98 F (36.7 C)  TempSrc: Oral  Resp: 16  Height: 5\' 6"  (1.676 m)  Weight: 185 lb 1.6 oz (83.961 kg)  SpO2: 95%    Body mass index is 29.89 kg/(m^2).  Physical Exam  Constitutional: Patient appears well-developed and well-nourished. Obese  No distress.  HEENT: head atraumatic, normocephalic, pupils equal and reactive to light, e neck supple, throat within normal limits Cardiovascular: Normal rate, regular rhythm and normal heart sounds.  No murmur heard. No BLE edema. Pulmonary/Chest: Effort normal and breath sounds normal. No respiratory distress. Abdominal: Soft.  There is no tenderness. Psychiatric: Patient has a normal mood and affect. behavior is normal. Judgment and thought content normal.  PHQ2/9: Depression screen [facility] 2/9 04/06/2015 10/07/2014  Decreased Interest 0 0  Down, Depressed, Hopeless 0 0  PHQ - 2 Score 0 0    Fall Risk: Fall Risk  04/06/2015 10/07/2014  Falls in the past year? No No     Functional Status Survey: Is the patient deaf or have difficulty hearing?: No Does the patient have difficulty seeing, even when wearing glasses/contacts?: Yes Does the patient have difficulty concentrating, remembering, or making decisions?: No Does the patient have difficulty walking or climbing stairs?: No Does the patient have difficulty dressing or bathing?: No Does the patient have difficulty doing errands alone such as visiting a doctor's office or  shopping?: No   Assessment & Plan  1. Diabetes mellitus type 2, diet-controlled ([facility])  - Hemoglobin A1c  2. Hypertension, benign  - triamterene-hydrochlorothiazide (MAXZIDE) 75-50 MG tablet; Take 1 tablet by mouth daily.  Dispense: 90 tablet; Refill: 1  3. Vitamin D deficiency  Continue vitamin D supplementation  4. Seasonal allergies  Resume medication prn   5. Weight loss, non-intentional  - TSH - Sedimentation rate - CBC with Differential/Platelet - Comprehensive metabolic panel

## 2015-04-07 LAB — COMPREHENSIVE METABOLIC PANEL
A/G RATIO: 1.3 (ref 1.2–2.2)
ALBUMIN: 4 g/dL (ref 3.6–4.8)
ALK PHOS: 55 IU/L (ref 39–117)
ALT: 12 IU/L (ref 0–32)
AST: 16 IU/L (ref 0–40)
BILIRUBIN TOTAL: 0.3 mg/dL (ref 0.0–1.2)
BUN / CREAT RATIO: 24 (ref 11–26)
BUN: 20 mg/dL (ref 8–27)
CHLORIDE: 103 mmol/L (ref 96–106)
CO2: 27 mmol/L (ref 18–29)
CREATININE: 0.85 mg/dL (ref 0.57–1.00)
Calcium: 9.7 mg/dL (ref 8.7–10.3)
GFR calc Af Amer: 81 mL/min/{1.73_m2} (ref >59)
GFR calc non Af Amer: 71 mL/min/{1.73_m2} (ref >59)
GLOBULIN, TOTAL: 3.2 g/dL (ref 1.5–4.5)
Glucose: 100 mg/dL — ABNORMAL HIGH (ref 65–99)
POTASSIUM: 5.1 mmol/L (ref 3.5–5.2)
SODIUM: 143 mmol/L (ref 134–144)
Total Protein: 7.2 g/dL (ref 6.0–8.5)

## 2015-04-07 LAB — HEMOGLOBIN A1C
Est. average glucose Bld gHb Est-mCnc: 131 mg/dL
Hgb A1c MFr Bld: 6.2 % — ABNORMAL HIGH (ref 4.8–5.6)

## 2015-04-07 LAB — CBC WITH DIFFERENTIAL/PLATELET
Basophils Absolute: 0 10*3/uL (ref 0.0–0.2)
Basos: 1 %
EOS (ABSOLUTE): 0.3 10*3/uL (ref 0.0–0.4)
EOS: 7 %
HEMATOCRIT: 35.8 % (ref 34.0–46.6)
HEMOGLOBIN: 12 g/dL (ref 11.1–15.9)
Immature Grans (Abs): 0 10*3/uL (ref 0.0–0.1)
Immature Granulocytes: 0 %
LYMPHS ABS: 1.6 10*3/uL (ref 0.7–3.1)
Lymphs: 40 %
MCH: 28.8 pg (ref 26.6–33.0)
MCHC: 33.5 g/dL (ref 31.5–35.7)
MCV: 86 fL (ref 79–97)
MONOCYTES: 12 %
MONOS ABS: 0.5 10*3/uL (ref 0.1–0.9)
NEUTROS ABS: 1.6 10*3/uL (ref 1.4–7.0)
Neutrophils: 40 %
Platelets: 290 10*3/uL (ref 150–379)
RBC: 4.16 x10E6/uL (ref 3.77–5.28)
RDW: 14.6 % (ref 12.3–15.4)
WBC: 4 10*3/uL (ref 3.4–10.8)

## 2015-04-07 LAB — SEDIMENTATION RATE: SED RATE: 8 mm/h (ref 0–40)

## 2015-04-07 LAB — TSH: TSH: 3.23 u[IU]/mL (ref 0.450–4.500)

## 2015-04-11 ENCOUNTER — Telehealth: Payer: Self-pay | Admitting: *Deleted

## 2015-04-11 NOTE — Telephone Encounter (Signed)
Patient calling requesting Rx for PT office

## 2015-04-11 NOTE — Telephone Encounter (Signed)
This was sent to [facility]. I can have a Rx tomorrow for her for [facility] if she wants to come get it.

## 2015-04-12 ENCOUNTER — Telehealth: Payer: Self-pay | Admitting: *Deleted

## 2015-04-12 DIAGNOSIS — M898X7 Other specified disorders of bone, ankle and foot: Secondary | ICD-10-CM

## 2015-04-12 DIAGNOSIS — Z9889 Other specified postprocedural states: Secondary | ICD-10-CM

## 2015-04-12 NOTE — Telephone Encounter (Signed)
Dr. Jacqualyn Posey ordered PT with [facility] in [place] for left retrocalcaneal exostosis, status post for evaluation and treatment for 3xweek for 4 weeks, focusing on improving ROM, strengthening and flexibility and home exercises.  Faxed.

## 2015-04-14 ENCOUNTER — Encounter: Payer: PPO | Admitting: Podiatry

## 2015-04-18 DIAGNOSIS — M25572 Pain in left ankle and joints of left foot: Secondary | ICD-10-CM | POA: Diagnosis not present

## 2015-04-18 DIAGNOSIS — R262 Difficulty in walking, not elsewhere classified: Secondary | ICD-10-CM | POA: Diagnosis not present

## 2015-04-18 DIAGNOSIS — M25672 Stiffness of left ankle, not elsewhere classified: Secondary | ICD-10-CM | POA: Diagnosis not present

## 2015-04-21 DIAGNOSIS — M25572 Pain in left ankle and joints of left foot: Secondary | ICD-10-CM | POA: Diagnosis not present

## 2015-04-21 DIAGNOSIS — M25672 Stiffness of left ankle, not elsewhere classified: Secondary | ICD-10-CM | POA: Diagnosis not present

## 2015-04-21 DIAGNOSIS — R262 Difficulty in walking, not elsewhere classified: Secondary | ICD-10-CM | POA: Diagnosis not present

## 2015-04-25 DIAGNOSIS — M25672 Stiffness of left ankle, not elsewhere classified: Secondary | ICD-10-CM | POA: Diagnosis not present

## 2015-04-25 DIAGNOSIS — R262 Difficulty in walking, not elsewhere classified: Secondary | ICD-10-CM | POA: Diagnosis not present

## 2015-04-25 DIAGNOSIS — M25572 Pain in left ankle and joints of left foot: Secondary | ICD-10-CM | POA: Diagnosis not present

## 2015-04-27 DIAGNOSIS — M25672 Stiffness of left ankle, not elsewhere classified: Secondary | ICD-10-CM | POA: Diagnosis not present

## 2015-04-27 DIAGNOSIS — R262 Difficulty in walking, not elsewhere classified: Secondary | ICD-10-CM | POA: Diagnosis not present

## 2015-04-27 DIAGNOSIS — M25572 Pain in left ankle and joints of left foot: Secondary | ICD-10-CM | POA: Diagnosis not present

## 2015-05-03 DIAGNOSIS — M25672 Stiffness of left ankle, not elsewhere classified: Secondary | ICD-10-CM | POA: Diagnosis not present

## 2015-05-03 DIAGNOSIS — M25572 Pain in left ankle and joints of left foot: Secondary | ICD-10-CM | POA: Diagnosis not present

## 2015-05-03 DIAGNOSIS — R262 Difficulty in walking, not elsewhere classified: Secondary | ICD-10-CM | POA: Diagnosis not present

## 2015-05-06 DIAGNOSIS — M25672 Stiffness of left ankle, not elsewhere classified: Secondary | ICD-10-CM | POA: Diagnosis not present

## 2015-05-06 DIAGNOSIS — R262 Difficulty in walking, not elsewhere classified: Secondary | ICD-10-CM | POA: Diagnosis not present

## 2015-05-06 DIAGNOSIS — M25572 Pain in left ankle and joints of left foot: Secondary | ICD-10-CM | POA: Diagnosis not present

## 2015-05-11 DIAGNOSIS — R262 Difficulty in walking, not elsewhere classified: Secondary | ICD-10-CM | POA: Diagnosis not present

## 2015-05-11 DIAGNOSIS — M25672 Stiffness of left ankle, not elsewhere classified: Secondary | ICD-10-CM | POA: Diagnosis not present

## 2015-05-11 DIAGNOSIS — M25572 Pain in left ankle and joints of left foot: Secondary | ICD-10-CM | POA: Diagnosis not present

## 2015-05-13 DIAGNOSIS — M25572 Pain in left ankle and joints of left foot: Secondary | ICD-10-CM | POA: Diagnosis not present

## 2015-05-13 DIAGNOSIS — R262 Difficulty in walking, not elsewhere classified: Secondary | ICD-10-CM | POA: Diagnosis not present

## 2015-05-13 DIAGNOSIS — M25672 Stiffness of left ankle, not elsewhere classified: Secondary | ICD-10-CM | POA: Diagnosis not present

## 2015-05-16 DIAGNOSIS — H5203 Hypermetropia, bilateral: Secondary | ICD-10-CM | POA: Diagnosis not present

## 2015-05-16 DIAGNOSIS — H2513 Age-related nuclear cataract, bilateral: Secondary | ICD-10-CM | POA: Diagnosis not present

## 2015-05-16 DIAGNOSIS — H524 Presbyopia: Secondary | ICD-10-CM | POA: Diagnosis not present

## 2015-05-16 DIAGNOSIS — H52223 Regular astigmatism, bilateral: Secondary | ICD-10-CM | POA: Diagnosis not present

## 2015-05-16 LAB — HM DIABETES EYE EXAM

## 2015-05-19 ENCOUNTER — Ambulatory Visit
Admission: RE | Admit: 2015-05-19 | Discharge: 2015-05-19 | Disposition: A | Discharge status: Home / Self Care | Payer: PPO | Source: Ambulatory Visit | Attending: General Surgery | Admitting: General Surgery

## 2015-05-19 ENCOUNTER — Other Ambulatory Visit: Payer: Self-pay | Admitting: General Surgery

## 2015-05-19 DIAGNOSIS — D4860 Neoplasm of uncertain behavior of unspecified breast: Secondary | ICD-10-CM

## 2015-05-19 DIAGNOSIS — R920 Mammographic microcalcification found on diagnostic imaging of breast: Secondary | ICD-10-CM | POA: Diagnosis not present

## 2015-05-26 ENCOUNTER — Ambulatory Visit: Payer: PPO | Admitting: General Surgery

## 2015-05-30 ENCOUNTER — Encounter: Payer: Self-pay | Admitting: General Surgery

## 2015-05-30 ENCOUNTER — Ambulatory Visit (INDEPENDENT_AMBULATORY_CARE_PROVIDER_SITE_OTHER): Payer: PPO | Admitting: General Surgery

## 2015-05-30 VITALS — BP 130/74 | HR 80 | Resp 14 | Ht 66.0 in | Wt 186.0 lb

## 2015-05-30 DIAGNOSIS — Z803 Family history of malignant neoplasm of breast: Secondary | ICD-10-CM | POA: Diagnosis not present

## 2015-05-30 DIAGNOSIS — N6489 Other specified disorders of breast: Secondary | ICD-10-CM | POA: Diagnosis not present

## 2015-05-30 NOTE — Progress Notes (Signed)
Patient ID: Stacy [name], female   DOB: Dec 01, 1945, 70 y.o.   MRN: [id number]  Chief Complaint  Patient presents with  . Other    mammogram    HPI Stacy [name] is a 70 y.o. female who presents for a breast evaluation. The most recent mammogram was done on 05/19/15. Patient does perform regular self breast checks and gets regular mammograms done.   I have reviewed the history of present illness with the patient.    HPI  Past Medical History  Diagnosis Date  . Hypertension   . Neoplasm of uncertain behavior of breast 2007    Past Surgical History  Procedure Laterality Date  . Foot surgery  2004  . Colonoscopy  2011    DR,ISHAKIS  . Breast excisional biopsy Left 1968    x 3 per pt  . Breast excisional biopsy Right 2007  . Breast biopsy Right 2015    3 stereo biopsies    Family History  Problem Relation Age of Onset  . Breast cancer Sister 27  . Arthritis Mother   . Cancer Maternal Grandmother     Gallbladder  . Stroke Paternal Grandmother     Social History Social History  Substance Use Topics  . Smoking status: Never Smoker   . Smokeless tobacco: Never Used  . Alcohol Use: No    Allergies  Allergen Reactions  . Altace [Ramipril] Swelling    Current Outpatient Prescriptions  Medication Sig Dispense Refill  . aspirin 81 MG tablet Take 81 mg by mouth daily.    . Cholecalciferol (VITAMIN D-3 PO) Take by mouth.    . Cinnamon 500 MG capsule Take 1,000 mg by mouth daily.    Marland Kitchen FISH OIL-BORAGE-FLAX-SAFFLOWER PO Take 2 tablets by mouth daily.    . fluticasone (FLONASE) 50 MCG/ACT nasal spray Place 2 sprays into both nostrils daily. 16 g 1  . lidocaine (XYLOCAINE) 5 % ointment Apply 1 application topically as needed. 35.44 g 11  . loratadine (CLARITIN) 10 MG tablet Take 1 tablet (10 mg total) by mouth daily. 90 tablet 1  . POTASSIUM PO Take 1 tablet by mouth daily.    Marland Kitchen triamterene-hydrochlorothiazide (MAXZIDE) 75-50 MG tablet Take 1 tablet by mouth daily. 90  tablet 1   No current facility-administered medications for this visit.    Review of Systems Review of Systems  Constitutional: Negative.   Respiratory: Negative.   Cardiovascular: Negative.     Blood pressure 130/74, pulse 80, resp. rate 14, height 5\' 6"  (1.676 m), weight 186 lb (84.369 kg).  Physical Exam Physical Exam  Constitutional: She is oriented to person, place, and time. She appears well-developed and well-nourished.  Eyes: Conjunctivae are normal. No scleral icterus.  Neck: Neck supple.  Cardiovascular: Normal rate, regular rhythm and normal heart sounds.   Pulmonary/Chest: Effort normal and breath sounds normal. Right breast exhibits no inverted nipple, no mass, no nipple discharge, no skin change and no tenderness. Left breast exhibits no inverted nipple, no mass, no nipple discharge, no skin change and no tenderness.  Abdominal: Soft. Bowel sounds are normal. There is no hepatomegaly. There is no tenderness.  Lymphadenopathy:    She has no cervical adenopathy.    She has no axillary adenopathy.  Neurological: She is alert and oriented to person, place, and time.  Skin: Skin is warm and dry.    Data Reviewed Mammogram reviewed-stable  Assessment    FH of breast cancer. Multiple right breast stereo biopsies-one showed radial scar-no atypia.  Exam and imaging are stable     Plan    Return in 1 yr with bilateral screening mammogram      PCP: [place] This has been scribed by Lesly Rubenstein LPN    Serge Main G 05/30/2015, 10:00 AM

## 2015-05-30 NOTE — Patient Instructions (Signed)
Continue self breast exams. Call office for any new breast issues or concerns. 

## 2015-06-07 ENCOUNTER — Encounter: Payer: Self-pay | Admitting: Podiatry

## 2015-06-07 ENCOUNTER — Ambulatory Visit (INDEPENDENT_AMBULATORY_CARE_PROVIDER_SITE_OTHER): Payer: PPO

## 2015-06-07 ENCOUNTER — Ambulatory Visit (INDEPENDENT_AMBULATORY_CARE_PROVIDER_SITE_OTHER): Payer: PPO | Admitting: Podiatry

## 2015-06-07 VITALS — BP 127/75 | HR 85 | Resp 18

## 2015-06-07 DIAGNOSIS — M79673 Pain in unspecified foot: Secondary | ICD-10-CM

## 2015-06-07 DIAGNOSIS — M2570 Osteophyte, unspecified joint: Secondary | ICD-10-CM

## 2015-06-07 DIAGNOSIS — M898X7 Other specified disorders of bone, ankle and foot: Secondary | ICD-10-CM

## 2015-06-07 DIAGNOSIS — M2140 Flat foot [pes planus] (acquired), unspecified foot: Secondary | ICD-10-CM | POA: Diagnosis not present

## 2015-06-09 NOTE — Progress Notes (Signed)
Patient ID: Stacy [name], female   DOB: [birth year], 70 y.o.   MRN: [id number]  Subjective: Jalani Burse is a 70 y.o. is seen today in office s/p left retrocalcaneal exostecotmy, tenolysis, hammertoe repair preformed on 12-15-14.  She states that she gets some pain to her foot when she walks actually has no pain at rest. She did complete physical therapy which helped increase her motion. She denies any recent injury or trauma. Swelling has improved. No redness or warmth. No other complaints at this time. Denies any systemic complaints such as fevers, chills, nausea, vomiting. No calf pain, chest pain, shortness of breath.   Objective: No acute distress, AAOx3  Cam boot intact Left foot: DP/PT pulses are palpable, CRT less than 3 seconds. Protective sensation intact.  Incisions are all well coapted without any evidence of dehiscence to all surgical sites and scar has well formed.  There is no surrounding erythema, ascending cellulitis, fluctuance, crepitus, malodor, drainage/purulence.  At this time there is no tenderness to palpation on the surgical site. Subjectively she gets pain only when she walks and she feels it is pulling. There is a decrease in medial arch height upon weightbearing bilaterally. There is no area pinpoint bony tenderness there is no pain vibratory sensation. Ankle, subtalar joint range of motion is intact without any restrictions at this time. No other open lesions or pre-ulcerative lesions.  No pain with calf compression, swelling, warmth, erythema.   Assessment and Plan:  Status post left foot surgery  With pain with ambulation likely due to biomechanical changes.  -Treatment options discussed including all alternatives, risks, and complications -X-rays were obtained and reviewed with the patient.  Some reoccurrence of posterior superior hypertrophy of the bone, no evidence of acute fracture. -Given her flatfoot and pain with ambulation and I recommended insert for her  shoes. Power steps were dispensed today. Discussed custom but we will start with the power steps. Continue range of motion, rehabilitation exercises. -Ice to the area as needed -Follow-up as scheduled.  Celesta Gentile, DPM

## 2015-07-26 ENCOUNTER — Ambulatory Visit: Payer: PPO | Admitting: Podiatry

## 2015-08-10 ENCOUNTER — Telehealth: Payer: Self-pay | Admitting: *Deleted

## 2015-08-10 NOTE — Telephone Encounter (Signed)
Pt left name and phone number.  I left message informing pt if she left a message I would possibly be able to have an answer for her.

## 2015-09-15 ENCOUNTER — Telehealth: Payer: Self-pay | Admitting: *Deleted

## 2015-09-15 NOTE — Telephone Encounter (Signed)
Faxed received from [facility] requesting to know if Lidocaine ointment 5% had been prescribed. I completed the form and faxed back.

## 2015-10-10 ENCOUNTER — Ambulatory Visit (INDEPENDENT_AMBULATORY_CARE_PROVIDER_SITE_OTHER): Payer: PPO | Admitting: Family Medicine

## 2015-10-10 ENCOUNTER — Encounter: Payer: Self-pay | Admitting: Family Medicine

## 2015-10-10 VITALS — BP 108/64 | HR 80 | Temp 98.6°F | Resp 18 | Ht 66.5 in | Wt 186.5 lb

## 2015-10-10 DIAGNOSIS — E119 Type 2 diabetes mellitus without complications: Secondary | ICD-10-CM

## 2015-10-10 DIAGNOSIS — Z23 Encounter for immunization: Secondary | ICD-10-CM

## 2015-10-10 DIAGNOSIS — E559 Vitamin D deficiency, unspecified: Secondary | ICD-10-CM

## 2015-10-10 DIAGNOSIS — Z0000 Encounter for general adult medical examination without abnormal findings: Secondary | ICD-10-CM

## 2015-10-10 DIAGNOSIS — L304 Erythema intertrigo: Secondary | ICD-10-CM | POA: Diagnosis not present

## 2015-10-10 DIAGNOSIS — I10 Essential (primary) hypertension: Secondary | ICD-10-CM

## 2015-10-10 LAB — COMPLETE METABOLIC PANEL WITH GFR
ALK PHOS: 51 U/L (ref 33–130)
ALT: 14 U/L (ref 6–29)
AST: 18 U/L (ref 10–35)
Albumin: 4.2 g/dL (ref 3.6–5.1)
BUN: 18 mg/dL (ref 7–25)
CALCIUM: 9.4 mg/dL (ref 8.6–10.4)
CO2: 30 mmol/L (ref 20–31)
CREATININE: 0.84 mg/dL (ref 0.50–0.99)
Chloride: 100 mmol/L (ref 98–110)
GFR, Est African American: 83 mL/min (ref >=60)
GFR, Est Non African American: 72 mL/min (ref >=60)
GLUCOSE: 106 mg/dL — AB (ref 65–99)
POTASSIUM: 3.7 mmol/L (ref 3.5–5.3)
SODIUM: 139 mmol/L (ref 135–146)
Total Bilirubin: 0.4 mg/dL (ref 0.2–1.2)
Total Protein: 7.4 g/dL (ref 6.1–8.1)

## 2015-10-10 LAB — POCT GLYCOSYLATED HEMOGLOBIN (HGB A1C): Hemoglobin A1C: 6.1

## 2015-10-10 LAB — LIPID PANEL
CHOL/HDL RATIO: 2 ratio (ref <=5.0)
Cholesterol: 155 mg/dL (ref 125–200)
HDL: 76 mg/dL (ref >=46)
LDL Cholesterol: 64 mg/dL (ref <130)
Triglycerides: 75 mg/dL (ref <150)
VLDL: 15 mg/dL (ref <30)

## 2015-10-10 LAB — POCT UA - MICROALBUMIN: Microalbumin Ur, POC: NEGATIVE mg/L

## 2015-10-10 MED ORDER — TRIAMTERENE-HCTZ 75-50 MG PO TABS: 1.0000 | ORAL_TABLET | Freq: Every day | ORAL | 1 refills | Status: DC

## 2015-10-10 MED ORDER — KETOCONAZOLE 2 % EX CREA: 1.0000 "application " | TOPICAL_CREAM | Freq: Every day | CUTANEOUS | 0 refills | Status: DC

## 2015-10-10 NOTE — Patient Instructions (Signed)
  Stacy [name] ,  Thank you for taking time to come for your Medicare Wellness Visit. I appreciate your ongoing commitment to your health goals. Please review the following plan we discussed and let me know if I can assist you in the future.     This is a list of the screening recommended for you and due dates:  Health Maintenance  Topic Date Due  . Eye exam for diabetics  11/11/2015  . Hemoglobin A1C  04/08/2016  . Tetanus Vaccine  07/15/2016  . Complete foot exam   10/09/2016  . Urine Protein Check  10/09/2016  . Mammogram  05/18/2017  . Colon Cancer Screening  08/30/2019  . Flu Shot  Completed  . DEXA scan (bone density measurement)  Completed  . Shingles Vaccine  Completed  .  Hepatitis C: One time screening is recommended by Center for Disease Control  (CDC) for  adults born from [birth year] through 1964.   Completed  . Pneumonia vaccines  Completed

## 2015-10-10 NOTE — Progress Notes (Signed)
Name: Stacy [name]   MRN: [id number]    DOB: [birth year]   Date:10/10/2015       Progress Note  Subjective  Chief Complaint  Chief Complaint  Patient presents with  . Annual Exam  . Medication Refill    6 month F/U  . Diabetes    Patient does not check sugar at home  . Hypertension    No symptoms  . Allergic Rhinitis     Well controlled    HPI  Functional ability/safety issues: No Issues Hearing issues: Addressed  Activities of daily living: Discussed Home safety issues: No Issues  End Of Life Planning: Offered verbal information regarding advanced directives, healthcare power of attorney. Thinking about it  Preventative care, Health maintenance, Preventative health measures discussed.  Preventative screenings discussed today: lab work, colonoscopy,  mammogram, DEXA.  Low Dose CT Chest recommended if Age 71-80 years, 30 pack-year currently smoking OR have quit w/in 15years.   Lifestyle risk factor issued reviewed: Diet, exercise, weight management, advised patient smoking is not healthy, nutrition/diet.  Preventative health measures discussed (5-10 year plan).  Reviewed and recommended vaccinations: - Pneumovax  - Prevnar  - Annual Influenza - Zostavax - Tdap   Depression screening: Done Fall risk screening: Done Discuss ADLs/IADLs: Done  Current medical providers: See HPI  Other health risk factors identified this visit: No other issues Cognitive impairment issues: None identified  All above discussed with patient. Appropriate education, counseling and referral will be made based upon the above.   DMII: doing well, on diet only, can't take ACE caused angioedema. Takes aspirin daily. Denies polyphagia, polydipsia or polyuria. She is following a diabetic diet. hgbA1C is at goal and micro negative  HTN: taking diuretic, tolerating it well, takes potassium daily to avoid leg cramps. Denies chest pain, palpitation or SOB, she has  mild lower extremity edema.     Tendinitis: she continues to have left heel pain, but is doing better, had foot surgery in November, but achilles tendon pain intermittent, not as bad as it used to be  Mammogram: sees Dr. Jamal Collin, biopsy in 2014 negative for cancer  Intertrigo: she has noticed an intermittent rash under her breast, that is itchy, she has to use baby powders  Patient Active Problem List   Diagnosis Date Noted  . Radial scar of breast 05/30/2015  . History of foot surgery 12/15/2014  . Hypertension, benign 10/07/2014  . Seasonal allergies 10/07/2014  . Diabetes mellitus type 2, diet-controlled ([place]) 10/07/2014  . Morbid obesity ([place]) 10/07/2014  . Vitamin D deficiency 10/07/2014  . Varicose veins 10/07/2014  . Left Achilles tendinitis 10/07/2014  . Family history of breast cancer 05/12/2012  . 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 BREAST BIOPSY Right 2015   3 stereo biopsies  . BREAST EXCISIONAL BIOPSY Left 1968   x 3 per pt  . BREAST EXCISIONAL BIOPSY Right 2007  . COLONOSCOPY  2011   DR,ISHAKIS  . FOOT SURGERY  2004  . HEEL SPUR SURGERY Left 12/15/2014   Dr. Earleen Newport at [facility] [address]    Family History  Problem Relation Age of Onset  . Breast cancer Sister 52  . Arthritis Mother   . Stroke Paternal Grandmother   . Cancer Maternal Grandmother     Gallbladder    Social History   Social History  . Marital status: Married    Spouse name: N/A  . Number of children:  N/A  . Years of education: N/A   Occupational History  . Not on file.   Social History Main Topics  . Smoking status: Never Smoker  . Smokeless tobacco: Never Used  . Alcohol use No  . Drug use: No  . Sexual activity: Not Currently     Comment: Husband has prostate issues   Other Topics Concern  . Not on file   Social History Narrative  . No narrative on file     Current Outpatient Prescriptions:  .  aspirin 81 MG tablet, Take 81  mg by mouth daily., Disp: , Rfl:  .  Cholecalciferol (VITAMIN D-3 PO), Take by mouth., Disp: , Rfl:  .  Cinnamon 500 MG capsule, Take 1,000 mg by mouth daily., Disp: , Rfl:  .  FISH OIL-BORAGE-FLAX-SAFFLOWER PO, Take 2 tablets by mouth daily., Disp: , Rfl:  .  fluticasone (FLONASE) 50 MCG/ACT nasal spray, Place 2 sprays into both nostrils daily., Disp: 16 g, Rfl: 1 .  lidocaine (XYLOCAINE) 5 % ointment, Apply 1 application topically as needed., Disp: 35.44 g, Rfl: 11 .  loratadine (CLARITIN) 10 MG tablet, Take 1 tablet (10 mg total) by mouth daily., Disp: 90 tablet, Rfl: 1 .  POTASSIUM PO, Take 1 tablet by mouth daily., Disp: , Rfl:  .  triamterene-hydrochlorothiazide (MAXZIDE) 75-50 MG tablet, Take 1 tablet by mouth daily., Disp: 90 tablet, Rfl: 1  Allergies  Allergen Reactions  . Altace [Ramipril] Swelling     ROS  Constitutional: Negative for fever or weight change.  Respiratory: Negative for cough and shortness of breath.   Cardiovascular: Negative for chest pain or palpitations.  Gastrointestinal: Negative for abdominal pain, no bowel changes.  Musculoskeletal: Negative for gait problem or joint swelling.  Skin: Negative for rash.  Neurological: Negative for dizziness or headache.  No other specific complaints in a complete review of systems (except as listed in HPI above).  Objective  Vitals:   10/10/15 0833  BP: 108/64  Pulse: 80  Resp: 18  Temp: 98.6 F (37 C)  TempSrc: Oral  SpO2: 97%  Weight: 186 lb 8 oz (84.6 kg)  Height: 5' 6.5" (1.689 m)    Body mass index is 29.65 kg/m.  Physical Exam  Constitutional: Patient appears well-developed and obese. No distress.  HENT: Head: Normocephalic and atraumatic. Ears: B TMs ok, no erythema or effusion; Nose: Nose normal. Mouth/Throat: Oropharynx is clear and moist. No oropharyngeal exudate.  Eyes: Conjunctivae and EOM are normal. Pupils are equal, round, and reactive to light. No scleral icterus.  Neck: Normal range  of motion. Neck supple. No JVD present. No thyromegaly present.  Cardiovascular: Normal rate, regular rhythm and normal heart sounds.  No murmur heard. Trace of BLE edema. Varicose veins lower extremities Pulmonary/Chest: Effort normal and breath sounds normal. No respiratory distress. Abdominal: Soft. Bowel sounds are normal, no distension. There is no tenderness. no masses Breast: no lumps or masses, no nipple discharge or rashes FEMALE GENITALIA:  External genitalia normal External urethra normal Pelvic not done RECTAL: not done Musculoskeletal: Normal range of motion, no joint effusions. No gross deformities Neurological: he is alert and oriented to person, place, and time. No cranial nerve deficit. Coordination, balance, strength, speech and gait are normal.  Skin: Skin is warm and dry. Erythematous rash with satellite lesions under both breasts Psychiatric: Patient has a normal mood and affect. behavior is normal. Judgment and thought content normal.  Recent Results (from the past 2160 hour(s))  POCT HgB A1C  Status: None   Collection Time: 10/10/15  8:39 AM  Result Value Ref Range   Hemoglobin A1C 6.1   POCT UA - Microalbumin     Status: Normal   Collection Time: 10/10/15  8:39 AM  Result Value Ref Range   Microalbumin Ur, POC negative mg/L   Creatinine, POC  mg/dL   Albumin/Creatinine Ratio, Urine, POC      Diabetic Foot Exam: Diabetic Foot Exam - Simple   Simple Foot Form Diabetic Foot exam was performed with the following findings:  Yes 10/10/2015  9:02 AM  Visual Inspection No deformities, no ulcerations, no other skin breakdown bilaterally:  Yes Sensation Testing Intact to touch and monofilament testing bilaterally:  Yes Pulse Check Posterior Tibialis and Dorsalis pulse intact bilaterally:  Yes Comments      PHQ2/9: Depression screen [facility] 2/9 10/10/2015 04/06/2015 10/07/2014  Decreased Interest 0 0 0  Down, Depressed, Hopeless 0 0 0  PHQ - 2 Score 0 0 0      Fall Risk: Fall Risk  10/10/2015 04/06/2015 10/07/2014  Falls in the past year? No No No      Functional Status Survey: Is the patient deaf or have difficulty hearing?: No Does the patient have difficulty seeing, even when wearing glasses/contacts?: No Does the patient have difficulty concentrating, remembering, or making decisions?: No Does the patient have difficulty walking or climbing stairs?: No Does the patient have difficulty dressing or bathing?: No Does the patient have difficulty doing errands alone such as visiting a doctor's office or shopping?: No    Assessment & Plan  1. Medicare annual wellness visit, subsequent  Discussed importance of 150 minutes of physical activity weekly, eat two servings of fish weekly, eat one serving of tree nuts ( cashews, pistachios, pecans, almonds.Marland Kitchen) every other day, eat 6 servings of fruit/vegetables daily and drink plenty of water and avoid sweet beverages.   2. Diabetes mellitus type 2, diet-controlled ([place])  - POCT HgB A1C - POCT UA - Microalbumin - Lipid panel  3. Needs flu shot  - Flu vaccine HIGH DOSE PF (Fluzone High dose)  4. Hypertension, benign  - COMPLETE METABOLIC PANEL WITH GFR - triamterene-hydrochlorothiazide (MAXZIDE) 75-50 MG tablet; Take 1 tablet by mouth daily.  Dispense: 90 tablet; Refill: 1  5. Vitamin D deficiency  - VITAMIN D 25 Hydroxy (Vit-D Deficiency, Fractures)  6. Need for pneumococcal vaccination  - Pneumococcal polysaccharide vaccine 23-valent greater than or equal to 2yo subcutaneous/IM   7. Intertrigo  - ketoconazole (NIZORAL) 2 % cream; Apply 1 application topically daily.  Dispense: 60 g; Refill: 0

## 2015-10-11 LAB — VITAMIN D 25 HYDROXY (VIT D DEFICIENCY, FRACTURES): Vit D, 25-Hydroxy: 41 ng/mL (ref 30–100)

## 2015-10-17 ENCOUNTER — Telehealth: Payer: Self-pay | Admitting: *Deleted

## 2015-10-17 NOTE — Telephone Encounter (Signed)
Entered in error

## 2015-11-21 DIAGNOSIS — H2513 Age-related nuclear cataract, bilateral: Secondary | ICD-10-CM | POA: Diagnosis not present

## 2015-11-21 DIAGNOSIS — H524 Presbyopia: Secondary | ICD-10-CM | POA: Diagnosis not present

## 2015-11-21 DIAGNOSIS — H52223 Regular astigmatism, bilateral: Secondary | ICD-10-CM | POA: Diagnosis not present

## 2015-11-21 DIAGNOSIS — H5203 Hypermetropia, bilateral: Secondary | ICD-10-CM | POA: Diagnosis not present

## 2015-11-21 LAB — HM DIABETES EYE EXAM

## 2016-01-24 ENCOUNTER — Encounter: Payer: Self-pay | Admitting: *Deleted

## 2016-01-24 ENCOUNTER — Telehealth: Payer: Self-pay | Admitting: *Deleted

## 2016-01-24 NOTE — Telephone Encounter (Addendum)
[place] request confirmation of rx from our office for Diclofenac 1.5% gel and Lidocaine 5% ointment each with 11 refills. I reviewed Dr. Leigh Aurora communications for orders and 01/24/2015 he ordered refills of the Lidocaine ointment and Diclofenac gel. Letter sent.

## 2016-03-16 ENCOUNTER — Other Ambulatory Visit: Payer: Self-pay

## 2016-03-16 DIAGNOSIS — Z1231 Encounter for screening mammogram for malignant neoplasm of breast: Secondary | ICD-10-CM

## 2016-04-09 ENCOUNTER — Encounter: Payer: Self-pay | Admitting: Family Medicine

## 2016-04-09 ENCOUNTER — Ambulatory Visit (INDEPENDENT_AMBULATORY_CARE_PROVIDER_SITE_OTHER): Payer: PPO | Admitting: Family Medicine

## 2016-04-09 VITALS — BP 116/68 | HR 83 | Temp 98.4°F | Resp 16 | Ht 67.0 in | Wt 193.1 lb

## 2016-04-09 DIAGNOSIS — E661 Drug-induced obesity: Secondary | ICD-10-CM | POA: Diagnosis not present

## 2016-04-09 DIAGNOSIS — E119 Type 2 diabetes mellitus without complications: Secondary | ICD-10-CM

## 2016-04-09 DIAGNOSIS — E559 Vitamin D deficiency, unspecified: Secondary | ICD-10-CM | POA: Diagnosis not present

## 2016-04-09 DIAGNOSIS — I10 Essential (primary) hypertension: Secondary | ICD-10-CM | POA: Diagnosis not present

## 2016-04-09 DIAGNOSIS — Z6830 Body mass index (BMI) 30.0-30.9, adult: Secondary | ICD-10-CM | POA: Diagnosis not present

## 2016-04-09 LAB — POCT GLYCOSYLATED HEMOGLOBIN (HGB A1C): Hemoglobin A1C: 6.1

## 2016-04-09 MED ORDER — TRIAMTERENE-HCTZ 75-50 MG PO TABS: 1.0000 | ORAL_TABLET | Freq: Every day | ORAL | 1 refills | Status: DC

## 2016-04-09 NOTE — Progress Notes (Signed)
Name: Stacy [name]   MRN: [id number]    DOB: 02-Jun-1945   Date:04/09/2016       Progress Note  Subjective  Chief Complaint  Chief Complaint  Patient presents with  . Diabetes    diet controlled 6 month follow up  . Hypertension    HPI   DMII: doing well, on diet only, can't take ACE caused angioedema. Takes aspirin daily. Denies polyphagia, polydipsia or polyuria. She is following a diabetic diet. hgbA1C is at goal and micro negative, we will obtain records from eye exam from Dr. Matilde Sprang  HTN: taking diuretic, tolerating it well, takes potassium occasionally for leg cramps. Denies chest pain, palpitation or SOB, she has mild lower extremity edema.   Obesity: she has DM and HTN, discussed life style modification. She states she will become more physically active working in her yard, she also does some home exercises. Yoga tapes.   Patient Active Problem List   Diagnosis Date Noted  . Radial scar of breast 05/30/2015  . History of foot surgery 12/15/2014  . Hypertension, benign 10/07/2014  . Seasonal allergies 10/07/2014  . Diabetes mellitus type 2, diet-controlled ([place]) 10/07/2014  . Morbid obesity ([place]) 10/07/2014  . Vitamin D deficiency 10/07/2014  . Varicose veins 10/07/2014  . Left Achilles tendinitis 10/07/2014  . Family history of breast cancer 05/12/2012  . 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 BREAST BIOPSY Right 2015   3 stereo biopsies  . BREAST EXCISIONAL BIOPSY Left 1968   x 3 per pt  . BREAST EXCISIONAL BIOPSY Right 2007  . COLONOSCOPY  2011   DR,ISHAKIS  . FOOT SURGERY  2004  . HEEL SPUR SURGERY Left 12/15/2014   Dr. Earleen Newport at [facility]    Family History  Problem Relation Age of Onset  . Breast cancer Sister 39  . Arthritis Mother   . Stroke Paternal Grandmother   . Cancer Maternal Grandmother     Gallbladder    Social History   Social History  . Marital status:  Married    Spouse name: N/A  . Number of children: N/A  . Years of education: N/A   Occupational History  . Not on file.   Social History Main Topics  . Smoking status: Never Smoker  . Smokeless tobacco: Never Used  . Alcohol use No  . Drug use: No  . Sexual activity: Not Currently     Comment: Husband has prostate issues   Other Topics Concern  . Not on file   Social History Narrative  . No narrative on file     Current Outpatient Prescriptions:  .  aspirin 81 MG tablet, Take 81 mg by mouth daily., Disp: , Rfl:  .  Cholecalciferol (VITAMIN D-3 PO), Take by mouth., Disp: , Rfl:  .  Cinnamon 500 MG capsule, Take 1,000 mg by mouth daily., Disp: , Rfl:  .  FISH OIL-BORAGE-FLAX-SAFFLOWER PO, Take 2 tablets by mouth daily., Disp: , Rfl:  .  fluticasone (FLONASE) 50 MCG/ACT nasal spray, Place 2 sprays into both nostrils daily., Disp: 16 g, Rfl: 1 .  ketoconazole (NIZORAL) 2 % cream, Apply 1 application topically daily., Disp: 60 g, Rfl: 0 .  lidocaine (XYLOCAINE) 5 % ointment, Apply 1 application topically as needed., Disp: 35.44 g, Rfl: 11 .  loratadine (CLARITIN) 10 MG tablet, Take 1 tablet (10 mg total) by mouth daily., Disp: 90 tablet, Rfl: 1 .  POTASSIUM PO, Take 1 tablet by mouth daily., Disp: , Rfl:  .  triamterene-hydrochlorothiazide (MAXZIDE) 75-50 MG tablet, Take 1 tablet by mouth daily., Disp: 90 tablet, Rfl: 1  Allergies  Allergen Reactions  . Altace [Ramipril] Swelling     ROS  Constitutional: Negative for fever or weight change.  Respiratory: Negative for cough and shortness of breath.   Cardiovascular: Negative for chest pain or palpitations.  Gastrointestinal: Negative for abdominal pain, no bowel changes.  Musculoskeletal: Negative for gait problem or joint swelling.  Skin: Negative for rash.  Neurological: Negative for dizziness or headache.  No other specific complaints in a complete review of systems (except as listed in HPI  above).  Objective  Vitals:   04/09/16 0853  BP: 116/68  Pulse: 83  Resp: 16  Temp: 98.4 F (36.9 C)  SpO2: 98%  Weight: 193 lb 2 oz (87.6 kg)  Height: 5\' 7"  (1.702 m)    Body mass index is 30.25 kg/m.  Physical Exam  Constitutional: Patient appears well-developed and well-nourished. Obese  No distress.  HEENT: head atraumatic, normocephalic, pupils equal and reactive to light,neck supple, throat within normal limits Cardiovascular: Normal rate, regular rhythm and normal heart sounds.  No murmur heard. Trace  BLE edema. Pulmonary/Chest: Effort normal and breath sounds normal. No respiratory distress. Abdominal: Soft.  There is no tenderness. Psychiatric: Patient has a normal mood and affect. behavior is normal. Judgment and thought content normal.   Recent Results (from the past 2160 hour(s))  POCT HgB A1C     Status: Normal   Collection Time: 04/09/16  9:05 AM  Result Value Ref Range   Hemoglobin A1C 6.1      PHQ2/9: Depression screen [facility] 2/9 04/09/2016 10/10/2015 04/06/2015 10/07/2014  Decreased Interest 0 0 0 0  Down, Depressed, Hopeless 0 0 0 0  PHQ - 2 Score 0 0 0 0    Fall Risk: Fall Risk  04/09/2016 10/10/2015 04/06/2015 10/07/2014  Falls in the past year? No No No No    Functional Status Survey: Is the patient deaf or have difficulty hearing?: No Does the patient have difficulty seeing, even when wearing glasses/contacts?: No Does the patient have difficulty concentrating, remembering, or making decisions?: No Does the patient have difficulty walking or climbing stairs?: No Does the patient have difficulty dressing or bathing?: No Does the patient have difficulty doing errands alone such as visiting a doctor's office or shopping?: No    Assessment & Plan  1. Diabetes mellitus type 2, diet-controlled ([facility])  - POCT HgB A1C  2. Hypertension, benign  Advised to stop potassium supplementation and try natural supplements such as bananas, pears -  triamterene-hydrochlorothiazide (MAXZIDE) 75-50 MG tablet; Take 1 tablet by mouth daily.  Dispense: 90 tablet; Refill: 1  3. Vitamin D deficiency  Continue vitamin D otc  4. Class 1 drug-induced obesity with serious comorbidity and body mass index (BMI) of 30.0 to 30.9 in adult  Discussed life style modification

## 2016-05-14 DIAGNOSIS — R739 Hyperglycemia, unspecified: Secondary | ICD-10-CM | POA: Diagnosis not present

## 2016-05-14 DIAGNOSIS — H2513 Age-related nuclear cataract, bilateral: Secondary | ICD-10-CM | POA: Diagnosis not present

## 2016-05-14 DIAGNOSIS — H1033 Unspecified acute conjunctivitis, bilateral: Secondary | ICD-10-CM | POA: Diagnosis not present

## 2016-05-14 DIAGNOSIS — H52223 Regular astigmatism, bilateral: Secondary | ICD-10-CM | POA: Diagnosis not present

## 2016-05-14 DIAGNOSIS — H5203 Hypermetropia, bilateral: Secondary | ICD-10-CM | POA: Diagnosis not present

## 2016-05-14 LAB — HM DIABETES EYE EXAM

## 2016-05-21 ENCOUNTER — Ambulatory Visit
Admission: RE | Admit: 2016-05-21 | Discharge: 2016-05-21 | Disposition: A | Discharge status: Home / Self Care | Payer: PPO | Source: Ambulatory Visit | Attending: General Surgery | Admitting: General Surgery

## 2016-05-21 DIAGNOSIS — Z1231 Encounter for screening mammogram for malignant neoplasm of breast: Secondary | ICD-10-CM

## 2016-05-22 ENCOUNTER — Other Ambulatory Visit: Payer: Self-pay | Admitting: General Surgery

## 2016-05-22 DIAGNOSIS — R928 Other abnormal and inconclusive findings on diagnostic imaging of breast: Secondary | ICD-10-CM

## 2016-05-22 DIAGNOSIS — Z1231 Encounter for screening mammogram for malignant neoplasm of breast: Secondary | ICD-10-CM

## 2016-05-24 ENCOUNTER — Ambulatory Visit
Admission: RE | Admit: 2016-05-24 | Discharge: 2016-05-24 | Disposition: A | Discharge status: Home / Self Care | Payer: PPO | Source: Ambulatory Visit | Attending: General Surgery | Admitting: General Surgery

## 2016-05-24 DIAGNOSIS — R928 Other abnormal and inconclusive findings on diagnostic imaging of breast: Secondary | ICD-10-CM | POA: Diagnosis not present

## 2016-05-24 DIAGNOSIS — Z1231 Encounter for screening mammogram for malignant neoplasm of breast: Secondary | ICD-10-CM | POA: Insufficient documentation

## 2016-05-28 ENCOUNTER — Encounter: Payer: Self-pay | Admitting: General Surgery

## 2016-05-28 ENCOUNTER — Ambulatory Visit (INDEPENDENT_AMBULATORY_CARE_PROVIDER_SITE_OTHER): Payer: PPO | Admitting: General Surgery

## 2016-05-28 VITALS — BP 124/68 | HR 70 | Resp 13 | Ht 66.0 in | Wt 186.0 lb

## 2016-05-28 DIAGNOSIS — N6489 Other specified disorders of breast: Secondary | ICD-10-CM | POA: Diagnosis not present

## 2016-05-28 DIAGNOSIS — Z803 Family history of malignant neoplasm of breast: Secondary | ICD-10-CM

## 2016-05-28 NOTE — Progress Notes (Signed)
Patient ID: Stacy [name], female   DOB: Mar 07, 1945, 71 y.o.   MRN: [id number]  Chief Complaint  Patient presents with  . Follow-up    HPI Stacy [name] is a 71 y.o. female who presents for a breast evaluation. The most recent mammogram was done on 05/21/2016 .  Patient does perform regular self breast checks and gets regular mammograms done.    HPI  Past Medical History:  Diagnosis Date  . Hypertension   . Neoplasm of uncertain behavior of breast 2007    Past Surgical History:  Procedure Laterality Date  . BREAST BIOPSY Right 2015   3 stereo biopsies. benign. one was radial scar  . BREAST EXCISIONAL BIOPSY Left 1968   x 3 per pt  . BREAST EXCISIONAL BIOPSY Right 2007  . COLONOSCOPY  2011   DR,ISHAKIS  . FOOT SURGERY  2004  . HEEL SPUR SURGERY Left 12/15/2014   Dr. Earleen Newport at [facility]    Family History  Problem Relation Age of Onset  . Breast cancer Sister 40  . Arthritis Mother   . Stroke Paternal Grandmother   . Cancer Maternal Grandmother        Gallbladder    Social History Social History  Substance Use Topics  . Smoking status: Never Smoker  . Smokeless tobacco: Never Used  . Alcohol use No    Allergies  Allergen Reactions  . Altace [Ramipril] Swelling    Current Outpatient Prescriptions  Medication Sig Dispense Refill  . aspirin 81 MG tablet Take 81 mg by mouth daily.    . Cholecalciferol (VITAMIN D-3 PO) Take by mouth.    . Cinnamon 500 MG capsule Take 1,000 mg by mouth daily.    Marland Kitchen FISH OIL-BORAGE-FLAX-SAFFLOWER PO Take 2 tablets by mouth daily.    . fluticasone (FLONASE) 50 MCG/ACT nasal spray Place 2 sprays into both nostrils daily. 16 g 1  . ketoconazole (NIZORAL) 2 % cream Apply 1 application topically daily. 60 g 0  . lidocaine (XYLOCAINE) 5 % ointment Apply 1 application topically as needed. 35.44 g 11  . loratadine (CLARITIN) 10 MG tablet Take 1 tablet (10 mg total) by mouth daily. 90 tablet 1  . POTASSIUM PO Take 1 tablet by  mouth daily.    Marland Kitchen triamterene-hydrochlorothiazide (MAXZIDE) 75-50 MG tablet Take 1 tablet by mouth daily. 90 tablet 1   No current facility-administered medications for this visit.     Review of Systems Review of Systems  Blood pressure 124/68, pulse 70, resp. rate 13, height 5\' 6"  (1.676 m), weight 186 lb (84.4 kg).  Physical Exam Physical Exam  Constitutional: She is oriented to person, place, and time. She appears well-developed and well-nourished.  Eyes: Conjunctivae are normal. No scleral icterus.  Neck: Neck supple.  Cardiovascular: Normal rate, regular rhythm and normal heart sounds.   Pulmonary/Chest: Effort normal and breath sounds normal. Right breast exhibits no inverted nipple, no mass, no nipple discharge, no skin change and no tenderness. Left breast exhibits no inverted nipple, no mass, no nipple discharge, no skin change and no tenderness.  Abdominal: Soft. Bowel sounds are normal. There is no tenderness.  Lymphadenopathy:    She has no cervical adenopathy.    She has no axillary adenopathy.  Neurological: She is alert and oriented to person, place, and time.  Skin: Skin is warm and dry.    Data Reviewed Mammogram reviewed - stable findings  Assessment    Stable exam, Family history of breast cancer. Radial  scar by stereo biopsy right breast  In 2015, no atypia.     Plan    Patient will be asked to return to the office in one year with a bilateral screening mammogram with Dr. Bary Castilla.  HPI, Physical Exam, Assessment and Plan have been scribed under the direction and in the presence of Mckinley Jewel, MD  Gaspar Cola, CMA     I have completed the exam and reviewed the above documentation for accuracy and completeness.  I agree with the above.  Haematologist has been used and any errors in dictation or transcription are unintentional.  Madelon Welsch G. Jamal Collin, M.D., F.A.C.S.   Junie Panning G 05/28/2016, 10:09 AM

## 2016-05-28 NOTE — Patient Instructions (Addendum)
Patient will be asked to return to the office in one year with a bilateral screening mammogram with Dr. Byrnett.  

## 2016-10-08 ENCOUNTER — Ambulatory Visit (INDEPENDENT_AMBULATORY_CARE_PROVIDER_SITE_OTHER): Payer: PPO

## 2016-10-08 VITALS — BP 116/56 | HR 76 | Temp 97.9°F | Ht 66.0 in | Wt 197.5 lb

## 2016-10-08 DIAGNOSIS — Z0000 Encounter for general adult medical examination without abnormal findings: Secondary | ICD-10-CM

## 2016-10-08 NOTE — Progress Notes (Signed)
Subjective:   Stacy [name] is a 71 y.o. female who presents for an Subsequent Medicare Annual Wellness Visit.  Review of Systems    N/A  Cardiac Risk Factors include: advanced age (>44men, >5 women);hypertension;obesity (BMI >30kg/m2);diabetes mellitus     Objective:    Today's Vitals   10/08/16 1043  BP: (!) 116/56  Pulse: 76  Temp: 97.9 F (36.6 C)  TempSrc: Oral  Weight: 197 lb 8 oz (89.6 kg)  Height: 5\' 6"  (1.676 m)  PainSc: 0-No pain   Body mass index is 31.88 kg/m.   Current Medications (verified) Outpatient Encounter Prescriptions as of 10/08/2016  Medication Sig  . aspirin 81 MG tablet Take 81 mg by mouth daily.  . Cholecalciferol (VITAMIN D-3 PO) Take 1,000 Units by mouth daily.   . Cinnamon 500 MG capsule Take 1,000 mg by mouth daily.  Marland Kitchen FISH OIL-BORAGE-FLAX-SAFFLOWER PO Take 2 tablets by mouth daily.  . fluticasone (FLONASE) 50 MCG/ACT nasal spray Place 2 sprays into both nostrils daily. (Patient taking differently: Place 2 sprays into both nostrils daily. )  . ketoconazole (NIZORAL) 2 % cream Apply 1 application topically daily. (Patient taking differently: Apply 1 application topically daily as needed. )  . lidocaine (XYLOCAINE) 5 % ointment Apply 1 application topically as needed.  . loratadine (CLARITIN) 10 MG tablet Take 1 tablet (10 mg total) by mouth daily. (Patient taking differently: Take 10 mg by mouth daily as needed. )  . Multiple Vitamins-Minerals (MULTIVITAMIN GUMMIES ADULT PO) Take by mouth daily.  Marland Kitchen POTASSIUM PO Take 1 tablet by mouth daily.   Marland Kitchen triamterene-hydrochlorothiazide (MAXZIDE) 75-50 MG tablet Take 1 tablet by mouth daily.   No facility-administered encounter medications on file as of 10/08/2016.     Allergies (verified) Altace [ramipril]   History: Past Medical History:  Diagnosis Date  . Hypertension   . Neoplasm of uncertain behavior of breast 2007   Past Surgical History:  Procedure Laterality Date  . BREAST BIOPSY  Right 2015   3 stereo biopsies. benign. one was radial scar  . BREAST EXCISIONAL BIOPSY Left 1968   x 3 per pt  . BREAST EXCISIONAL BIOPSY Right 2007  . COLONOSCOPY  2011   DR,ISHAKIS  . FOOT SURGERY  2004  . HEEL SPUR SURGERY Left 12/15/2014   Dr. Earleen Newport at [facility]   Family History  Problem Relation Age of Onset  . Breast cancer Sister 33  . Arthritis Mother   . Stroke Paternal Grandmother   . Cancer Brother 47       kidney  . Cancer Maternal Grandmother        Gallbladder   Social History   Occupational History  . Not on file.   Social History Main Topics  . Smoking status: Never Smoker  . Smokeless tobacco: Never Used  . Alcohol use No  . Drug use: No  . Sexual activity: Not Currently     Comment: Husband has prostate issues    Tobacco Counseling Counseling given: Not Answered   Activities of Daily Living In your present state of health, do you have any difficulty performing the following activities: 10/08/2016 04/09/2016  Hearing? N N  Vision? N N  Difficulty concentrating or making decisions? N N  Walking or climbing stairs? N N  Dressing or bathing? N N  Doing errands, shopping? N N  Preparing Food and eating ? N -  Using the Toilet? N -  In the past six months, have you accidently  leaked urine? N -  Do you have problems with loss of bowel control? N -  Managing your Medications? N -  Managing your Finances? N -  Housekeeping or managing your Housekeeping? N -  Some recent data might be hidden    Immunizations and Health Maintenance Immunization History  Administered Date(s) Administered  . Influenza, High Dose Seasonal PF 10/07/2014, 10/10/2015  . Influenza-Unspecified 03/20/2012, 10/01/2013  . Pneumococcal Conjugate-13 10/07/2014  . Pneumococcal Polysaccharide-23 07/27/2009, 10/10/2015  . Tdap 07/16/2006  . Zoster 10/19/2009   Health Maintenance Due  Topic Date Due  . TETANUS/TDAP  07/15/2016  . INFLUENZA VACCINE  08/15/2016     Patient Care Team: Steele Sizer, MD as PCP - General Christene Lye, MD (General Surgery) Idelle Leech, [place] as Consulting Physician (Optometry) Jacqualyn Posey Bonna Gains, DPM as Consulting Physician (Podiatry)  Indicate any recent Medical Services you may have received from other than [facility] providers in the past year (date may be approximate).     Assessment:   This is a routine wellness examination for Stacy [name].   Hearing/Vision screen Vision Screening Comments: Pt see Dr Matilde Sprang for vision checks every 6 months.   Dietary issues and exercise activities discussed: Current Exercise Habits: The patient does not participate in regular exercise at present, Exercise limited by: None identified  Goals    . Exercise 3x per week (20-30 min per time)          Recommend to exercise 3x per week for 20-30 min per time.      Depression Screen PHQ 2/9 Scores 10/08/2016 04/09/2016 10/10/2015 04/06/2015 10/07/2014  PHQ - 2 Score 0 0 0 0 0    Fall Risk Fall Risk  10/08/2016 04/09/2016 10/10/2015 04/06/2015 10/07/2014  Falls in the past year? No No No No No    Cognitive Function:     6CIT Screen 10/08/2016  What Year? 0 points  What month? 0 points  What time? 0 points  Count back from 20 0 points  Months in reverse 0 points  Repeat phrase 4 points  Total Score 4    Screening Tests Health Maintenance  Topic Date Due  . TETANUS/TDAP  07/15/2016  . INFLUENZA VACCINE  08/15/2016  . FOOT EXAM  10/09/2016  . URINE MICROALBUMIN  10/09/2016  . HEMOGLOBIN A1C  10/10/2016  . OPHTHALMOLOGY EXAM  11/20/2016  . MAMMOGRAM  05/25/2018  . COLONOSCOPY  08/30/2019  . DEXA SCAN  Completed  . Hepatitis C Screening  Completed  . PNA vac Low Risk Adult  Completed      Plan:  I have personally reviewed and addressed the Medicare Annual Wellness questionnaire and have noted the following in the patient's chart:  A. Medical and social history B. Use of alcohol, tobacco or illicit drugs  C. Current  medications and supplements D. Functional ability and status E.  Nutritional status F.  Physical activity G. Advance directives H. List of other physicians I.  Hospitalizations, surgeries, and ER visits in previous 12 months J.  [place] such as hearing and vision if needed, cognitive and depression L. Referrals and appointments - none  In addition, I have reviewed and discussed with patient certain preventive protocols, quality metrics, and best practice recommendations. A written personalized care plan for preventive services as well as general preventive health recommendations were provided to patient.  See attached scanned questionnaire for additional information.   Signed,  Fabio Neighbors, LPN Nurse Health Advisor   MD Recommendations: Pt needs the  influenza vaccine at next [facility] on 10/11/16. Pt declined receiving this today. Pt is going to follow up with insurance about tetanus coverage and will advise [place] what she finds out. Pt would also like to discuss the living will and HPOA with PCP.   I have reviewed this encounter including the documentation in this note and/or discussed this patient with the Johney Maine, FNP, NP-C. I am certifying that I agree with the content of this note as supervising physician.  Steele Sizer, MD [facility] 10/08/2016, 1:26 PM

## 2016-10-08 NOTE — Progress Notes (Signed)
Duplicate note- error MM 

## 2016-10-08 NOTE — Patient Instructions (Signed)
Stacy [name] , Thank you for taking time to come for your Medicare Wellness Visit. I appreciate your ongoing commitment to your health goals. Please review the following plan we discussed and let me know if I can assist you in the future.   Screening recommendations/referrals: Colonoscopy: up to date Mammogram: up to date Bone Density: up to date Recommended yearly ophthalmology/optometry visit for glaucoma screening and checkup Recommended yearly dental visit for hygiene and checkup  Vaccinations: Influenza vaccine: declined Pneumococcal vaccine: completed series Tdap vaccine: declined Shingles vaccine: completed 10/29/09  Advanced directives: Advance directive discussed with you today. Even though you declined this today please call our office should you change your mind and we can give you the proper paperwork for you to fill out.  Pt to discuss this further with PCP at next [facility].   Conditions/risks identified: Obesity; Recommend to exercise 3x per week for 20-30 min per time.  Next appointment: 10/11/16 @ 8:20 AM   Preventive Care 65 Years and Older, Female Preventive care refers to lifestyle choices and visits with your health care provider that can promote health and wellness. What does preventive care include?  A yearly physical exam. This is also called an annual well check.  Dental exams once or twice a year.  Routine eye exams. Ask your health care provider how often you should have your eyes checked.  Personal lifestyle choices, including:  Daily care of your teeth and gums.  Regular physical activity.  Eating a healthy diet.  Avoiding tobacco and drug use.  Limiting alcohol use.  Practicing safe sex.  Taking low-dose aspirin every day.  Taking vitamin and mineral supplements as recommended by your health care provider. What happens during an annual well check? The services and screenings done by your health care provider during your annual well check will  depend on your age, overall health, lifestyle risk factors, and family history of disease. Counseling  Your health care provider may ask you questions about your:  Alcohol use.  Tobacco use.  Drug use.  Emotional well-being.  Home and relationship well-being.  Sexual activity.  Eating habits.  History of falls.  Memory and ability to understand (cognition).  Work and work Statistician.  Reproductive health. Screening  You may have the following tests or measurements:  Height, weight, and BMI.  Blood pressure.  Lipid and cholesterol levels. These may be checked every 5 years, or more frequently if you are over 70 years old.  Skin check.  Lung cancer screening. You may have this screening every year starting at age 65 if you have a 30-pack-year history of smoking and currently smoke or have quit within the past 15 years.  Fecal occult blood test (FOBT) of the stool. You may have this test every year starting at age 30.  Flexible sigmoidoscopy or colonoscopy. You may have a sigmoidoscopy every 5 years or a colonoscopy every 10 years starting at age 85.  Hepatitis C blood test.  Hepatitis B blood test.  Sexually transmitted disease (STD) testing.  Diabetes screening. This is done by checking your blood sugar (glucose) after you have not eaten for a while (fasting). You may have this done every 1-3 years.  Bone density scan. This is done to screen for osteoporosis. You may have this done starting at age 66.  Mammogram. This may be done every 1-2 years. Talk to your health care provider about how often you should have regular mammograms. Talk with your health care provider about your test results,  treatment options, and if necessary, the need for more tests. Vaccines  Your health care provider may recommend certain vaccines, such as:  Influenza vaccine. This is recommended every year.  Tetanus, diphtheria, and acellular pertussis (Tdap, Td) vaccine. You may need a  Td booster every 10 years.  Zoster vaccine. You may need this after age 52.  Pneumococcal 13-valent conjugate (PCV13) vaccine. One dose is recommended after age 68.  Pneumococcal polysaccharide (PPSV23) vaccine. One dose is recommended after age 14. Talk to your health care provider about which screenings and vaccines you need and how often you need them. This information is not intended to replace advice given to you by your health care provider. Make sure you discuss any questions you have with your health care provider. Document Released: 01/28/2015 Document Revised: 09/21/2015 Document Reviewed: 11/02/2014 Elsevier Interactive Patient Education  2017 [employer] Prevention in the Home Falls can cause injuries. They can happen to people of all ages. There are many things you can do to make your home safe and to help prevent falls. What can I do on the outside of my home?  Regularly fix the edges of walkways and driveways and fix any cracks.  Remove anything that might make you trip as you walk through a door, such as a raised step or threshold.  Trim any bushes or trees on the path to your home.  Use bright outdoor lighting.  Clear any walking paths of anything that might make someone trip, such as rocks or tools.  Regularly check to see if handrails are loose or broken. Make sure that both sides of any steps have handrails.  Any raised decks and porches should have guardrails on the edges.  Have any leaves, snow, or ice cleared regularly.  Use sand or salt on walking paths during winter.  Clean up any spills in your garage right away. This includes oil or grease spills. What can I do in the bathroom?  Use night lights.  Install grab bars by the toilet and in the tub and shower. Do not use towel bars as grab bars.  Use non-skid mats or decals in the tub or shower.  If you need to sit down in the shower, use a plastic, non-slip stool.  Keep the floor dry. Clean  up any water that spills on the floor as soon as it happens.  Remove soap buildup in the tub or shower regularly.  Attach bath mats securely with double-sided non-slip rug tape.  Do not have throw rugs and other things on the floor that can make you trip. What can I do in the bedroom?  Use night lights.  Make sure that you have a light by your bed that is easy to reach.  Do not use any sheets or blankets that are too big for your bed. They should not hang down onto the floor.  Have a firm chair that has side arms. You can use this for support while you get dressed.  Do not have throw rugs and other things on the floor that can make you trip. What can I do in the kitchen?  Clean up any spills right away.  Avoid walking on wet floors.  Keep items that you use a lot in easy-to-reach places.  If you need to reach something above you, use a strong step stool that has a grab bar.  Keep electrical cords out of the way.  Do not use floor polish or wax that makes floors  slippery. If you must use wax, use non-skid floor wax.  Do not have throw rugs and other things on the floor that can make you trip. What can I do with my stairs?  Do not leave any items on the stairs.  Make sure that there are handrails on both sides of the stairs and use them. Fix handrails that are broken or loose. Make sure that handrails are as long as the stairways.  Check any carpeting to make sure that it is firmly attached to the stairs. Fix any carpet that is loose or worn.  Avoid having throw rugs at the top or bottom of the stairs. If you do have throw rugs, attach them to the floor with carpet tape.  Make sure that you have a light switch at the top of the stairs and the bottom of the stairs. If you do not have them, ask someone to add them for you. What else can I do to help prevent falls?  Wear shoes that:  Do not have high heels.  Have rubber bottoms.  Are comfortable and fit you well.  Are  closed at the toe. Do not wear sandals.  If you use a stepladder:  Make sure that it is fully opened. Do not climb a closed stepladder.  Make sure that both sides of the stepladder are locked into place.  Ask someone to hold it for you, if possible.  Clearly mark and make sure that you can see:  Any grab bars or handrails.  First and last steps.  Where the edge of each step is.  Use tools that help you move around (mobility aids) if they are needed. These include:  Canes.  Walkers.  Scooters.  Crutches.  Turn on the lights when you go into a dark area. Replace any light bulbs as soon as they burn out.  Set up your furniture so you have a clear path. Avoid moving your furniture around.  If any of your floors are uneven, fix them.  If there are any pets around you, be aware of where they are.  Review your medicines with your doctor. Some medicines can make you feel dizzy. This can increase your chance of falling. Ask your doctor what other things that you can do to help prevent falls. This information is not intended to replace advice given to you by your health care provider. Make sure you discuss any questions you have with your health care provider. Document Released: 10/28/2008 Document Revised: 06/09/2015 Document Reviewed: 02/05/2014 Elsevier Interactive Patient Education  2017 [employer].

## 2016-10-11 ENCOUNTER — Ambulatory Visit (INDEPENDENT_AMBULATORY_CARE_PROVIDER_SITE_OTHER): Payer: PPO | Admitting: Family Medicine

## 2016-10-11 ENCOUNTER — Encounter: Payer: Self-pay | Admitting: Family Medicine

## 2016-10-11 VITALS — BP 100/70 | HR 82 | Resp 16 | Ht 66.5 in | Wt 193.4 lb

## 2016-10-11 DIAGNOSIS — E119 Type 2 diabetes mellitus without complications: Secondary | ICD-10-CM

## 2016-10-11 DIAGNOSIS — I10 Essential (primary) hypertension: Secondary | ICD-10-CM | POA: Diagnosis not present

## 2016-10-11 DIAGNOSIS — Z01419 Encounter for gynecological examination (general) (routine) without abnormal findings: Secondary | ICD-10-CM

## 2016-10-11 DIAGNOSIS — Z23 Encounter for immunization: Secondary | ICD-10-CM | POA: Diagnosis not present

## 2016-10-11 DIAGNOSIS — E559 Vitamin D deficiency, unspecified: Secondary | ICD-10-CM | POA: Diagnosis not present

## 2016-10-11 DIAGNOSIS — R9431 Abnormal electrocardiogram [ECG] [EKG]: Secondary | ICD-10-CM

## 2016-10-11 DIAGNOSIS — Z0000 Encounter for general adult medical examination without abnormal findings: Secondary | ICD-10-CM | POA: Diagnosis not present

## 2016-10-11 LAB — POCT UA - MICROALBUMIN
ALBUMIN/CREATININE RATIO, URINE, POC: NEGATIVE
CREATININE, POC: NEGATIVE mg/dL
MICROALBUMIN (UR) POC: NEGATIVE mg/L

## 2016-10-11 LAB — POCT GLYCOSYLATED HEMOGLOBIN (HGB A1C): HEMOGLOBIN A1C: 6.1

## 2016-10-11 MED ORDER — TRIAMTERENE-HCTZ 37.5-25 MG PO TABS: 1.0000 | ORAL_TABLET | Freq: Every day | ORAL | 1 refills | Status: DC

## 2016-10-11 MED ORDER — TRIAMTERENE-HCTZ 75-50 MG PO TABS: 1.0000 | ORAL_TABLET | Freq: Every day | ORAL | 1 refills | Status: DC

## 2016-10-11 NOTE — Progress Notes (Signed)
Name: Stacy [name]   MRN: [id number]    DOB: 1945/06/30   Date:10/11/2016       Progress Note  Subjective  Chief Complaint  Chief Complaint  Patient presents with  . Annual Exam  . Follow-up    HPI  Well woman: she had a medicare well this week and here today for a CPE, no breast lumps, nipple discharge, she has not been sexually active, denies vaginal discharge or post-menopausal bleeding. Pap smears were normal in the past. Mammogram up to date, done 05/2016. Married.   DMII: doing well, on diet only, can't take ACE caused angioedema. Takes aspirin daily. Denies polyphagia, polydipsia or polyuria. She is following a diabetic diet. hgbA1C is at goal , we will check urine micro today , eye exam up to date, goes to Dr. Matilde Sprang, due for lipid panel   HTN: taking diuretic, tolerating it well, takes potassium occasionally for leg cramps. Denies chest pain, palpitation or SOB, she has mild lower extremity edema. She has noticed intermittent dizziness, bp is low we will decrease dose of diuretic today   Obesity: she has DM and HTN, discussed life style modification. She has not increased physical activity yet, she is also not eating mindfulness.    Patient Active Problem List   Diagnosis Date Noted  . Radial scar of breast 05/30/2015  . History of foot surgery 12/15/2014  . Hypertension, benign 10/07/2014  . Seasonal allergies 10/07/2014  . Diabetes mellitus type 2, diet-controlled ([place]) 10/07/2014  . Morbid obesity ([place]) 10/07/2014  . Vitamin D deficiency 10/07/2014  . Varicose veins 10/07/2014  . Left Achilles tendinitis 10/07/2014  . Family history of breast cancer 05/12/2012  . 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 BREAST BIOPSY Right 2015   3 stereo biopsies. benign. one was radial scar  . BREAST EXCISIONAL BIOPSY Left 1968   x 3 per pt  . BREAST EXCISIONAL BIOPSY Right 2007  . COLONOSCOPY  2011    DR,ISHAKIS  . FOOT SURGERY  2004  . HEEL SPUR SURGERY Left 12/15/2014   Dr[facility] Earleen [facility] at [facility]    Family History  Problem Relation Age of Onset  . Breast cancer Sister 13  . Arthritis Mother   . Stroke Paternal Grandmother   . Cancer Brother 41       kidney  . Cancer Maternal Grandmother        Gallbladder    Social History   Social History  . Marital status: Married    Spouse name: N/A  . Number of children: N/A  . Years of education: N/A   Occupational History  . Not on file.   Social History Main Topics  . Smoking status: Never Smoker  . Smokeless tobacco: Never Used  . Alcohol use No  . Drug use: No  . Sexual activity: Not Currently     Comment: Husband has prostate issues   Other Topics Concern  . Not on file   Social History Narrative  . No narrative on file     Current Outpatient Prescriptions:  .  aspirin 81 MG tablet, Take 81 mg by mouth daily., Disp: , Rfl:  .  Cholecalciferol (VITAMIN D-3 PO), Take 1,000 Units by mouth daily. , Disp: , Rfl:  .  Cinnamon 500 MG capsule, Take 1,000 mg by mouth daily., Disp: , Rfl:  .  FISH OIL-BORAGE-FLAX-SAFFLOWER PO, Take 2 tablets by mouth daily., Disp: ,  Rfl:  .  fluticasone (FLONASE) 50 MCG/ACT nasal spray, Place 2 sprays into both nostrils daily. (Patient taking differently: Place 2 sprays into both nostrils daily. ), Disp: 16 g, Rfl: 1 .  ketoconazole (NIZORAL) 2 % cream, Apply 1 application topically daily. (Patient taking differently: Apply 1 application topically daily as needed. ), Disp: 60 g, Rfl: 0 .  lidocaine (XYLOCAINE) 5 % ointment, Apply 1 application topically as needed., Disp: 35.44 g, Rfl: 11 .  loratadine (CLARITIN) 10 MG tablet, Take 1 tablet (10 mg total) by mouth daily. (Patient taking differently: Take 10 mg by mouth daily as needed. ), Disp: 90 tablet, Rfl: 1 .  Multiple Vitamins-Minerals (MULTIVITAMIN GUMMIES ADULT PO), Take by mouth daily., Disp: , Rfl:  .  POTASSIUM PO, Take 1  tablet by mouth daily. , Disp: , Rfl:  .  triamterene-hydrochlorothiazide (MAXZIDE-25) 37.5-25 MG tablet, Take 1 tablet by mouth daily., Disp: 90 tablet, Rfl: 1  Allergies  Allergen Reactions  . Altace [Ramipril] Swelling     ROS  Constitutional: Negative for fever or weight change.  Respiratory: Negative for cough and shortness of breath.   Cardiovascular: Negative for chest pain or palpitations.  Gastrointestinal: Negative for abdominal pain, no bowel changes.  Musculoskeletal: Negative for gait problem or joint swelling.  Skin: Negative for rash.  Neurological: Positive  for intermittent dizziness or headache.  No other specific complaints in a complete review of systems (except as listed in HPI above).  Objective  Vitals:   10/11/16 0802  BP: 100/70  Pulse: 82  Resp: 16  SpO2: 98%  Weight: 193 lb 6.4 oz (87.7 kg)  Height: 5' 6.5" (1.689 m)    Body mass index is 30.75 kg/m.  Physical Exam  Constitutional: Patient appears well-developed and obese. No distress.  HENT: Head: Normocephalic and atraumatic. Ears: B TMs ok, no erythema or effusion; Nose: Nose normal. Mouth/Throat: Oropharynx is clear and moist. No oropharyngeal exudate.  Eyes: Conjunctivae and EOM are normal. Pupils are equal, round, and reactive to light. No scleral icterus.  Neck: Normal range of motion. Neck supple. No JVD present. No thyromegaly present.  Cardiovascular: Normal rate, regular rhythm and normal heart sounds.  No murmur heard. BLE edema 1 plus on ankles, varicose veins ( mostly spider veins)  Pulmonary/Chest: Effort normal and breath sounds normal. No respiratory distress. Abdominal: Soft. Bowel sounds are normal, no distension. There is no tenderness. no masses Breast: no lumps or masses, no nipple discharge or rashes FEMALE GENITALIA:  External genitalia normal External urethra normal Pelvic exam RECTAL: not done Musculoskeletal: crepitus with extension of both knees Neurological: he  is alert and oriented to person, place, and time. No cranial nerve deficit. Coordination, balance, strength, speech and gait are normal.  Skin: Skin is warm and dry. No rash noted. No erythema.  Psychiatric: Patient has a normal mood and affect. behavior is normal. Judgment and thought content normal.  Recent Results (from the past 2160 hour(s))  POCT HgB A1C     Status: Abnormal   Collection Time: 10/11/16  8:22 AM  Result Value Ref Range   Hemoglobin A1C 6.1   POCT UA - Microalbumin     Status: Normal   Collection Time: 10/11/16  8:28 AM  Result Value Ref Range   Microalbumin Ur, POC Neg mg/L   Creatinine, POC Neg mg/dL   Albumin/Creatinine Ratio, Urine, POC Neg     Diabetic Foot Exam: Diabetic Foot Exam - Simple   Simple Foot Form Diabetic Foot  exam was performed with the following findings:  Yes 10/11/2016  8:56 AM  Visual Inspection No deformities, no ulcerations, no other skin breakdown bilaterally:  Yes Sensation Testing Intact to touch and monofilament testing bilaterally:  Yes Pulse Check Posterior Tibialis and Dorsalis pulse intact bilaterally:  Yes Comments      PHQ2/9: Depression screen [facility] 2/9 10/11/2016 10/08/2016 04/09/2016 10/10/2015 04/06/2015  Decreased Interest 0 0 0 0 0  Down, Depressed, Hopeless 0 0 0 0 0  PHQ - 2 Score 0 0 0 0 0     Fall Risk: Fall Risk  10/11/2016 10/08/2016 04/09/2016 10/10/2015 04/06/2015  Falls in the past year? No No No No No      Assessment & Plan  1. Well woman exam  Discussed importance of 150 minutes of physical activity weekly, eat two servings of fish weekly, eat one serving of tree nuts ( cashews, pistachios, pecans, almonds.Marland Kitchen) every other day, eat 6 servings of fruit/vegetables daily and drink plenty of water and avoid sweet beverages.   2. Diabetes mellitus type 2, diet-controlled ([place])  - POCT UA - Microalbumin - POCT HgB A1C - Lipid panel  3. Flu vaccine need  - Flu vaccine HIGH DOSE PF  4. Hypertension,  benign  - triamterene-hydrochlorothiazide (MAXZIDE) 37.5/25MG  tablet; Take 1 tablet by mouth daily.  Dispense: 90 tablet; Refill: 1 - CBC with Differential/Platelet - COMPLETE METABOLIC PANEL WITH GFR  5. Morbid obesity ([place])  Discussed with the patient the risk posed by an increased BMI. Discussed importance of portion control, calorie counting and at least 150 minutes of physical activity weekly. Avoid sweet beverages and drink more water. Eat at least 6 servings of fruit and vegetables daily   6. Vitamin D deficiency  - VITAMIN D 25 Hydroxy (Vit-D Deficiency, Fractures)   7. Abnormal EKG  - Ambulatory referral to Cardiology

## 2016-10-16 ENCOUNTER — Other Ambulatory Visit: Payer: Self-pay

## 2016-10-16 DIAGNOSIS — E559 Vitamin D deficiency, unspecified: Secondary | ICD-10-CM | POA: Diagnosis not present

## 2016-10-16 DIAGNOSIS — I10 Essential (primary) hypertension: Secondary | ICD-10-CM | POA: Diagnosis not present

## 2016-10-16 DIAGNOSIS — E119 Type 2 diabetes mellitus without complications: Secondary | ICD-10-CM | POA: Diagnosis not present

## 2016-10-16 NOTE — Addendum Note (Signed)
Addended by: Saunders Glance A on: 10/16/2016 08:37 AM   Modules accepted: Orders

## 2016-10-17 LAB — COMPLETE METABOLIC PANEL WITH GFR
AG Ratio: 1.3 (calc) (ref 1.0–2.5)
ALKALINE PHOSPHATASE (APISO): 50 U/L (ref 33–130)
ALT: 17 U/L (ref 6–29)
AST: 16 U/L (ref 10–35)
Albumin: 3.9 g/dL (ref 3.6–5.1)
BUN/Creatinine Ratio: 17 (calc) (ref 6–22)
BUN: 18 mg/dL (ref 7–25)
CALCIUM: 9.3 mg/dL (ref 8.6–10.4)
CO2: 29 mmol/L (ref 20–32)
CREATININE: 1.05 mg/dL — AB (ref 0.50–0.99)
Chloride: 104 mmol/L (ref 98–110)
GFR, Est African American: 63 mL/min/{1.73_m2} (ref >=60)
GFR, Est Non African American: 54 mL/min/{1.73_m2} — ABNORMAL LOW (ref >=60)
GLUCOSE: 103 mg/dL — AB (ref 65–99)
Globulin: 3 g/dL (calc) (ref 1.9–3.7)
Potassium: 4.2 mmol/L (ref 3.5–5.3)
SODIUM: 141 mmol/L (ref 135–146)
Total Bilirubin: 0.4 mg/dL (ref 0.2–1.2)
Total Protein: 6.9 g/dL (ref 6.1–8.1)

## 2016-10-17 LAB — LIPID PANEL
CHOL/HDL RATIO: 2.1 (calc) (ref <5.0)
Cholesterol: 161 mg/dL (ref <200)
HDL: 77 mg/dL (ref >50)
LDL Cholesterol (Calc): 67 mg/dL (calc)
Non-HDL Cholesterol (Calc): 84 mg/dL (calc) (ref <130)
TRIGLYCERIDES: 87 mg/dL (ref <150)

## 2016-10-17 LAB — CBC WITH DIFFERENTIAL/PLATELET
BASOS ABS: 40 {cells}/uL (ref 0–200)
Basophils Relative: 0.9 %
EOS PCT: 4.3 %
Eosinophils Absolute: 189 cells/uL (ref 15–500)
HCT: 38.6 % (ref 35.0–45.0)
Hemoglobin: 12.8 g/dL (ref 11.7–15.5)
Lymphs Abs: 1593 cells/uL (ref 850–3900)
MCH: 28.6 pg (ref 27.0–33.0)
MCHC: 33.2 g/dL (ref 32.0–36.0)
MCV: 86.2 fL (ref 80.0–100.0)
MONOS PCT: 9.6 %
MPV: 10.1 fL (ref 7.5–12.5)
NEUTROS PCT: 49 %
Neutro Abs: 2156 cells/uL (ref 1500–7800)
PLATELETS: 257 10*3/uL (ref 140–400)
RBC: 4.48 10*6/uL (ref 3.80–5.10)
RDW: 13.1 % (ref 11.0–15.0)
TOTAL LYMPHOCYTE: 36.2 %
WBC mixed population: 422 cells/uL (ref 200–950)
WBC: 4.4 10*3/uL (ref 3.8–10.8)

## 2016-10-17 LAB — VITAMIN D 25 HYDROXY (VIT D DEFICIENCY, FRACTURES): Vit D, 25-Hydroxy: 30 ng/mL (ref 30–100)

## 2016-11-13 DIAGNOSIS — H52223 Regular astigmatism, bilateral: Secondary | ICD-10-CM | POA: Diagnosis not present

## 2016-11-13 DIAGNOSIS — R739 Hyperglycemia, unspecified: Secondary | ICD-10-CM | POA: Diagnosis not present

## 2016-11-13 DIAGNOSIS — H2513 Age-related nuclear cataract, bilateral: Secondary | ICD-10-CM | POA: Diagnosis not present

## 2016-11-13 DIAGNOSIS — H5203 Hypermetropia, bilateral: Secondary | ICD-10-CM | POA: Diagnosis not present

## 2016-12-10 DIAGNOSIS — R9431 Abnormal electrocardiogram [ECG] [EKG]: Secondary | ICD-10-CM | POA: Insufficient documentation

## 2016-12-10 NOTE — Progress Notes (Signed)
Cardiology Office Note  Date:  12/11/2016   ID:  Stacy, Stacy [name] November 29, 1945, MRN [id number]  PCP:  Steele Sizer, MD   Chief Complaint  Patient presents with  . Other    New patient. Referred by PCP for Abormal EKG. Patient denies chest pain and SOB. Meds reviewed verbally with patient.     HPI:  [name] is a 71 year old woman with past medical history of Diabetes type 2, hemoglobin A1c 6.1 Hypertension Morbid obesity Smoker early, quit in 20s Presenting by referral from Dr. Ancil Boozer for consultation of her abnormal EKG ACE caused angioedema.   She reports leading sedentary lifestyle  no regular exercise program, limited by previous ankle surgery bilaterally Chronic lower extremity edema High fluid intake "vegetable eater"  Does her ADLs, house work. Husband helps Husband with cancer, prostate 10 yr ago, came back, doing XRT  Previous ankle surgery b/l  EKG from the primary care October 11, 2016 personally reviewed by myself on todays visit Shows T wave abnormality V3, V4, nonspecific ST abnormality  EKG from today personally reviewed by myself showing normal sinus rhythm rate 77 bpm nonspecific T wave abnormality, nonspecific ST abnormality   PMH:   has a past medical history of Hypertension and Neoplasm of uncertain behavior of breast (2007).  PSH:    Past Surgical History:  Procedure Laterality Date  . BREAST BIOPSY Right 2015   3 stereo biopsies. benign. one was radial scar  . BREAST EXCISIONAL BIOPSY Left 1968   x 3 per pt  . BREAST EXCISIONAL BIOPSY Right 2007  . COLONOSCOPY  2011   DR,ISHAKIS  . FOOT SURGERY  2004  . HEEL SPUR SURGERY Left 12/15/2014   Dr. Earleen Newport at [facility]    Current Outpatient Medications  Medication Sig Dispense Refill  . aspirin 81 MG tablet Take 81 mg by mouth daily.    . Cholecalciferol (VITAMIN D-3 PO) Take 1,000 Units by mouth daily.     . Cinnamon 500 MG capsule Take 1,000 mg by mouth daily.    Marland Kitchen FISH  OIL-BORAGE-FLAX-SAFFLOWER PO Take 2 tablets by mouth daily.    . fluticasone (FLONASE) 50 MCG/ACT nasal spray Place 2 sprays into both nostrils daily. (Patient taking differently: Place 2 sprays into both nostrils daily. ) 16 g 1  . ketoconazole (NIZORAL) 2 % cream Apply 1 application topically daily. (Patient taking differently: Apply 1 application topically daily as needed. ) 60 g 0  . lidocaine (XYLOCAINE) 5 % ointment Apply 1 application topically as needed. 35.44 g 11  . loratadine (CLARITIN) 10 MG tablet Take 1 tablet (10 mg total) by mouth daily. (Patient taking differently: Take 10 mg by mouth daily as needed. ) 90 tablet 1  . Multiple Vitamins-Minerals (MULTIVITAMIN GUMMIES ADULT PO) Take by mouth daily.    Marland Kitchen POTASSIUM PO Take 1 tablet by mouth daily.     Marland Kitchen triamterene-hydrochlorothiazide (MAXZIDE-25) 37.5-25 MG tablet Take 1 tablet by mouth daily. 90 tablet 1   No current facility-administered medications for this visit.      Allergies:   Altace [ramipril]   Social History:  The patient  reports that  has never smoked. she has never used smokeless tobacco. She reports that she does not drink alcohol or use drugs.   Family History:   family history includes Arthritis in her mother; Breast cancer (age of onset: 30) in her sister; Cancer in her maternal grandmother; Cancer (age of onset: 20) in her brother; Stroke in her paternal  grandmother.    Review of Systems: Review of Systems  Constitutional: Negative.   Respiratory: Negative.   Cardiovascular: Positive for leg swelling.  Gastrointestinal: Negative.   Musculoskeletal: Negative.   Neurological: Negative.   Psychiatric/Behavioral: Negative.   All other systems reviewed and are negative.    PHYSICAL EXAM: VS:  BP 126/60 (BP Location: Left Arm, Patient Position: Sitting, Cuff Size: Normal)   Pulse 77   Ht 5\' 6"  (1.676 m)   Wt 197 lb 8 oz (89.6 kg)   BMI 31.88 kg/m  , BMI Body mass index is 31.88 kg/m. GEN: Well  nourished, well developed, in no acute distress  HEENT: normal  Neck: no JVD, carotid bruits, or masses Cardiac: RRR; no murmurs, rubs, or gallops,no edema  Respiratory:  clear to auscultation bilaterally, normal work of breathing GI: soft, nontender, nondistended, + BS MS: no deformity or atrophy  Skin: warm and dry, no rash Neuro:  Strength and sensation are intact Psych: euthymic mood, full affect    Recent Labs: 10/16/2016: ALT 17; BUN 18; Creat 1.05; Hemoglobin 12.8; Platelets 257; Potassium 4.2; Sodium 141    Lipid Panel Lab Results  Component Value Date   CHOL 161 10/16/2016   HDL 77 10/16/2016   LDLCALC 64 10/10/2015   TRIG 87 10/16/2016      Wt Readings from Last 3 Encounters:  12/11/16 197 lb 8 oz (89.6 kg)  10/11/16 193 lb 6.4 oz (87.7 kg)  10/08/16 197 lb 8 oz (89.6 kg)       ASSESSMENT AND PLAN:  Hypertension, benign - Plan: EKG 12-Lead, ECHOCARDIOGRAM COMPLETE, NM Myocar Multi W/Spect W/Wall Motion / EF Blood pressure is well controlled on today's visit. No changes made to the medications.  Abnormal EKG - Plan: EKG 12-Lead, ECHOCARDIOGRAM COMPLETE, NM Myocar Multi W/Spect W/Wall Motion / EF Nonspecific T wave abnormality, nonspecific ST abnormality in a diabetic Unable to treadmill secondary to bilateral ankle surgeries in the past Sedentary at baseline, no regular exercise program Typically walks slowly We have scheduled a pharmacologic Myoview to rule out ischemia  Diabetes mellitus type 2, diet-controlled ([place]) - Plan: EKG 12-Lead We have encouraged continued exercise, careful diet management in an effort to lose weight.  Leg edema - Plan: ECHOCARDIOGRAM COMPLETE Pitting edema to above the mid shins, trace to 1+ Etiology unclear, unable to exclude type of CHF fluid retention We have ordered echocardiogram to evaluate ejection fraction, right heart pressures For elevated pressures may need periodic Lasix  Disposition:   F/U as needed   Total  encounter time more than 45 minutes  Greater than 50% was spent in counseling and coordination of care with the patient   Orders Placed This Encounter  Procedures  . NM Myocar Multi W/Spect W/Wall Motion / EF  . EKG 12-Lead  . ECHOCARDIOGRAM COMPLETE     Signed, Esmond Plants, M.D., Ph.D. 12/11/2016  [place], [place]

## 2016-12-11 ENCOUNTER — Ambulatory Visit (INDEPENDENT_AMBULATORY_CARE_PROVIDER_SITE_OTHER): Payer: PPO | Admitting: Cardiovascular Disease

## 2016-12-11 ENCOUNTER — Encounter: Payer: Self-pay | Admitting: Cardiovascular Disease

## 2016-12-11 VITALS — BP 126/60 | HR 77 | Ht 66.0 in | Wt 197.5 lb

## 2016-12-11 DIAGNOSIS — E119 Type 2 diabetes mellitus without complications: Secondary | ICD-10-CM

## 2016-12-11 DIAGNOSIS — R9431 Abnormal electrocardiogram [ECG] [EKG]: Secondary | ICD-10-CM

## 2016-12-11 DIAGNOSIS — R600 Localized edema: Secondary | ICD-10-CM | POA: Insufficient documentation

## 2016-12-11 DIAGNOSIS — I10 Essential (primary) hypertension: Secondary | ICD-10-CM

## 2016-12-11 NOTE — Patient Instructions (Addendum)
Medication Instructions:   No medication changes made  Labwork:  No new labs needed  Testing/Procedures:  We will order an echocardiogram for leg edema, abn ekg  Your physician has requested that you have an echocardiogram. Echocardiography is a painless test that uses sound waves to create images of your heart. It provides your doctor with information about the size and shape of your heart and how well your heart's chambers and valves are working. This procedure takes approximately one hour. There are no restrictions for this procedure.    We will order a stress test (lexiscan myoview) Abn EKG, unable to treadmill  [place]  Your caregiver has ordered a Stress Test with nuclear imaging. The purpose of this test is to evaluate the blood supply to your heart muscle. This procedure is referred to as a "Non-Invasive Stress Test." This is because other than having an IV started in your vein, nothing is inserted or "invades" your body. Cardiac stress tests are done to find areas of poor blood flow to the heart by determining the extent of coronary artery disease (CAD). Some patients exercise on a treadmill, which naturally increases the blood flow to your heart, while others who are  unable to walk on a treadmill due to physical limitations have a pharmacologic/chemical stress agent called Lexiscan . This medicine will mimic walking on a treadmill by temporarily increasing your coronary blood flow.   Please note: these test may take anywhere between 2-4 hours to complete  PLEASE REPORT TO [place] AT THE FIRST DESK WILL DIRECT YOU WHERE TO GO  Date of Procedure:________12/5/18_______________  Arrival Time for Procedure:________08:45 am__________  Instructions regarding medication:   Please take your usual morning medications with a small sip of water the morning of the procedure.   PLEASE NOTIFY THE OFFICE AT LEAST 62 HOURS IN ADVANCE IF YOU ARE  UNABLE TO KEEP YOUR APPOINTMENT.  [phone] AND  PLEASE NOTIFY NUCLEAR MEDICINE AT [facility] AT LEAST 24 HOURS IN ADVANCE IF YOU ARE UNABLE TO KEEP YOUR APPOINTMENT. [phone]  How to prepare for your Myoview test:  1. Do not eat or drink after midnight 2. No caffeine for 24 hours prior to test 3. No smoking 24 hours prior to test. 4. Your medication may be taken with water.  If your doctor stopped a medication because of this test, do not take that medication. 5. Ladies, please do not wear dresses.  Skirts or pants are appropriate. Please wear a short sleeve shirt. 6. No perfume, cologne or lotion. 7. Wear comfortable walking shoes. No heels!    Follow-Up: It was a pleasure seeing you in the office today. Please call us if you have new issues that need to be addressed before your next appt.  [phone]  Your physician wants you to follow-up in:  As needed    Cardiac Nuclear Scan A cardiac nuclear scan is a test that measures blood flow to the heart when a person is resting and when he or she is exercising. The test looks for problems such as:  Not enough blood reaching a portion of the heart.  The heart muscle not working normally.  You may need this test if:  You have heart disease.  You have had abnormal lab results.  You have had heart surgery or angioplasty.  You have chest pain.  You have shortness of breath.  In this test, a radioactive dye (tracer) is injected into your bloodstream. After the tracer has  traveled to your heart, an imaging device is used to measure how much of the tracer is absorbed by or distributed to various areas of your heart. This procedure is usually done at a hospital and takes 2-4 hours. Tell a health care provider about:  Any allergies you have.  All medicines you are taking, including vitamins, herbs, eye drops, creams, and over-the-counter medicines.  Any problems you or family members have had with the use of anesthetic  medicines.  Any blood disorders you have.  Any surgeries you have had.  Any medical conditions you have.  Whether you are pregnant or may be pregnant. What are the risks? Generally, this is a safe procedure. However, problems may occur, including:  Serious chest pain and heart attack. This is only a risk if the stress portion of the test is done.  Rapid heartbeat.  Sensation of warmth in your chest. This usually passes quickly.  What happens before the procedure?  Ask your health care provider about changing or stopping your regular medicines. This is especially important if you are taking diabetes medicines or blood thinners.  Remove your jewelry on the day of the procedure. What happens during the procedure?  An IV tube will be inserted into one of your veins.  Your health care provider will inject a small amount of radioactive tracer through the tube.  You will wait for 20-40 minutes while the tracer travels through your bloodstream.  Your heart activity will be monitored with an electrocardiogram (ECG).  You will lie down on an exam table.  Images of your heart will be taken for about 15-20 minutes.  You may be asked to exercise on a treadmill or stationary bike. While you exercise, your heart's activity will be monitored with an ECG, and your blood pressure will be checked. If you are unable to exercise, you may be given a medicine to increase blood flow to parts of your heart.  When blood flow to your heart has peaked, a tracer will again be injected through the IV tube.  After 20-40 minutes, you will get back on the exam table and have more images taken of your heart.  When the procedure is over, your IV tube will be removed. The procedure may vary among health care providers and hospitals. Depending on the type of tracer used, scans may need to be repeated 3-4 hours later. What happens after the procedure?  Unless your health care provider tells you otherwise,  you may return to your normal schedule, including diet, activities, and medicines.  Unless your health care provider tells you otherwise, you may increase your fluid intake. This will help flush the contrast dye from your body. Drink enough fluid to keep your urine clear or pale yellow.  It is up to you to get your test results. Ask your health care provider, or the department that is doing the test, when your results will be ready. Summary  A cardiac nuclear scan measures the blood flow to the heart when a person is resting and when he or she is exercising.  You may need this test if you are at risk for heart disease.  Tell your health care provider if you are pregnant.  Unless your health care provider tells you otherwise, increase your fluid intake. This will help flush the contrast dye from your body. Drink enough fluid to keep your urine clear or pale yellow. This information is not intended to replace advice given to you by your health care  provider. Make sure you discuss any questions you have with your health care provider. Document Released: 01/27/2004 Document Revised: 01/04/2016 Document Reviewed: 12/10/2012 Elsevier Interactive Patient Education  2017 [employer].   Echocardiogram An echocardiogram, or echocardiography, uses sound waves (ultrasound) to produce an image of your heart. The echocardiogram is simple, painless, obtained within a short period of time, and offers valuable information to your health care provider. The images from an echocardiogram can provide information such as:  Evidence of coronary artery disease (CAD).  Heart size.  Heart muscle function.  Heart valve function.  Aneurysm detection.  Evidence of a past heart attack.  Fluid buildup around the heart.  Heart muscle thickening.  Assess heart valve function.  Tell a health care provider about:  Any allergies you have.  All medicines you are taking, including vitamins, herbs, eye drops,  creams, and over-the-counter medicines.  Any problems you or family members have had with anesthetic medicines.  Any blood disorders you have.  Any surgeries you have had.  Any medical conditions you have.  Whether you are pregnant or may be pregnant. What happens before the procedure? No special preparation is needed. Eat and drink normally. What happens during the procedure?  In order to produce an image of your heart, gel will be applied to your chest and a wand-like tool (transducer) will be moved over your chest. The gel will help transmit the sound waves from the transducer. The sound waves will harmlessly bounce off your heart to allow the heart images to be captured in real-time motion. These images will then be recorded.  You may need an IV to receive a medicine that improves the quality of the pictures. What happens after the procedure? You may return to your normal schedule including diet, activities, and medicines, unless your health care provider tells you otherwise. This information is not intended to replace advice given to you by your health care provider. Make sure you discuss any questions you have with your health care provider. Document Released: 12/30/1999 Document Revised: 08/20/2015 Document Reviewed: 09/08/2012 Elsevier Interactive Patient Education  2017 [employer].

## 2016-12-19 ENCOUNTER — Encounter
Admission: RE | Admit: 2016-12-19 | Discharge: 2016-12-19 | Disposition: A | Discharge status: Home / Self Care | Payer: PPO | Source: Ambulatory Visit | Attending: Cardiovascular Disease | Admitting: Cardiovascular Disease

## 2016-12-19 DIAGNOSIS — R9431 Abnormal electrocardiogram [ECG] [EKG]: Secondary | ICD-10-CM | POA: Insufficient documentation

## 2016-12-19 DIAGNOSIS — I10 Essential (primary) hypertension: Secondary | ICD-10-CM | POA: Insufficient documentation

## 2016-12-19 MED ORDER — TECHNETIUM TC 99M TETROFOSMIN IV KIT
29.2900 | PACK | Freq: Once | INTRAVENOUS | Status: AC | PRN
  Administered 2016-12-19: 29.29 via INTRAVENOUS

## 2016-12-19 MED ORDER — REGADENOSON 0.4 MG/5ML IV SOLN
0.4000 mg | Freq: Once | INTRAVENOUS | Status: AC
  Administered 2016-12-19: 0.4 mg via INTRAVENOUS

## 2016-12-19 MED ORDER — TECHNETIUM TC 99M TETROFOSMIN IV KIT
13.1300 | PACK | Freq: Once | INTRAVENOUS | Status: AC | PRN
  Administered 2016-12-19: 13.13 via INTRAVENOUS

## 2016-12-20 LAB — NM MYOCAR MULTI W/SPECT W/WALL MOTION / EF
CHL CUP RESTING HR STRESS: 68 {beats}/min
LV dias vol: 60 mL (ref 46–106)
LVSYSVOL: 29 mL
SDS: 1
SRS: 7
SSS: 1
TID: 1.35

## 2016-12-21 ENCOUNTER — Ambulatory Visit (INDEPENDENT_AMBULATORY_CARE_PROVIDER_SITE_OTHER): Payer: PPO

## 2016-12-21 ENCOUNTER — Other Ambulatory Visit: Payer: Self-pay

## 2016-12-21 DIAGNOSIS — R600 Localized edema: Secondary | ICD-10-CM | POA: Diagnosis not present

## 2016-12-21 DIAGNOSIS — R9431 Abnormal electrocardiogram [ECG] [EKG]: Secondary | ICD-10-CM

## 2016-12-21 DIAGNOSIS — I10 Essential (primary) hypertension: Secondary | ICD-10-CM | POA: Diagnosis not present

## 2016-12-21 LAB — ECHOCARDIOGRAM COMPLETE
AO mean calculated velocity dopler: 105 cm/s
AOASC: 29 cm
AOVTI: 30.8 cm
AV Area VTI index: 1.34 cm2/m2
AV VEL mean LVOT/AV: 0.82
AV vel: 2.66
AVAREAMEANV: 2.34 cm2
AVAREAMEANVIN: 1.17 cm2/m2
AVG: 5 mmHg
Area-P 1/2: 4.89 cm2
CHL CUP AV VALUE AREA INDEX: 1.34
CHL CUP DOP CALC LVOT VTI: 28.9 cm
CHL CUP MV DEC (S): 144
EERAT: 10.22
EWDT: 144 ms
FS: 46 % — AB (ref 28–44)
IV/PV OW: 1
LA ID, A-P, ES: 31 mm
LA diam end sys: 31 mm
LA diam index: 1.56 cm/m2
LA vol: 37.6 mL
LAVOLA4C: 29.7 mL
LAVOLIN: 18.9 mL/m2
LV E/e' medial: 10.22
LV E/e'average: 10.22
LV PW d: 10 mm — AB (ref 0.6–1.1)
LV TDI E'LATERAL: 9.52
LVELAT: 9.52 cm/s
LVOT SV: 82 mL
LVOT area: 2.84 cm2
LVOT diameter: 19 mm
LVOTVTI: 0.94 cm
Lateral S' vel: 12 cm/s
MV pk A vel: 71.1 m/s
MV pk E vel: 97.3 m/s
MVPG: 4 mmHg
MVSPHT: 42 ms
TAPSE: 24.1 mm
TDI e' medial: 8.43
Valve area: 2.66 cm2

## 2017-04-04 ENCOUNTER — Other Ambulatory Visit: Payer: Self-pay

## 2017-04-04 DIAGNOSIS — Z1231 Encounter for screening mammogram for malignant neoplasm of breast: Secondary | ICD-10-CM

## 2017-04-10 ENCOUNTER — Encounter: Payer: Self-pay | Admitting: Family Medicine

## 2017-04-10 ENCOUNTER — Ambulatory Visit (INDEPENDENT_AMBULATORY_CARE_PROVIDER_SITE_OTHER): Payer: PPO | Admitting: Family Medicine

## 2017-04-10 VITALS — BP 130/80 | HR 77 | Resp 15 | Ht 66.0 in | Wt 200.3 lb

## 2017-04-10 DIAGNOSIS — I10 Essential (primary) hypertension: Secondary | ICD-10-CM | POA: Diagnosis not present

## 2017-04-10 DIAGNOSIS — E119 Type 2 diabetes mellitus without complications: Secondary | ICD-10-CM

## 2017-04-10 DIAGNOSIS — R600 Localized edema: Secondary | ICD-10-CM | POA: Diagnosis not present

## 2017-04-10 DIAGNOSIS — E559 Vitamin D deficiency, unspecified: Secondary | ICD-10-CM

## 2017-04-10 LAB — POCT GLYCOSYLATED HEMOGLOBIN (HGB A1C): Hemoglobin A1C: 6

## 2017-04-10 MED ORDER — TRIAMTERENE-HCTZ 37.5-25 MG PO TABS: 1.0000 | ORAL_TABLET | Freq: Every day | ORAL | 1 refills | Status: DC

## 2017-04-10 NOTE — Progress Notes (Signed)
Name: Stacy [name]   MRN: [id number]    DOB: 03-20-1945   Date:04/10/2017       Progress Note  Subjective  Chief Complaint  Chief Complaint  Patient presents with  . Diabetes  . Hypertension    HPI  DMII: doing well, on diet only, can't take ACE caused angioedema. Takes aspirin daily, discussed new report on aspirin and we will discontinue medication at this time. Denies polyphagia, polydipsia or polyuria. She is following a diabetic diet. hgbA1C is at goal , eye exam up to date, goes to Dr. Matilde Sprang - we will request last note. Last lipid and urine micro were normal.   HTN: taking diuretic, tolerating it well, takes potassium occasionally for leg cramps, last potassium level was normal. . Denies chest pain, palpitation, orthopnea  or SOB, she has noticed worsening of lower extremity edema since we went down on diuretic dose. Explained that we cannot go up since last visit bp was towards low end of normal and she was having intermittent dizziness.  Obesity: she has DM and HTN, discussed life style modification. She has not increased physical activity yet, however she states she likes to work in her yard, and will start doing so now that Spring is coming.    Patient Active Problem List   Diagnosis Date Noted  . Leg edema 12/11/2016  . Abnormal EKG 12/10/2016  . Radial scar of breast 05/30/2015  . History of foot surgery 12/15/2014  . Hypertension, benign 10/07/2014  . Seasonal allergies 10/07/2014  . Diabetes mellitus type 2, diet-controlled ([place]) 10/07/2014  . Morbid obesity ([place]) 10/07/2014  . Vitamin D deficiency 10/07/2014  . Varicose veins 10/07/2014  . Left Achilles tendinitis 10/07/2014  . Family history of breast cancer 05/12/2012  . 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 BREAST BIOPSY Right 2015   3 stereo biopsies. benign. one was radial scar  . BREAST EXCISIONAL BIOPSY Left 1968   x 3 per pt   . BREAST EXCISIONAL BIOPSY Right 2007  . COLONOSCOPY  2011   DR,ISHAKIS  . FOOT SURGERY  2004  . HEEL SPUR SURGERY Left 12/15/2014   Dr. Earleen Newport at [facility]    Family History  Problem Relation Age of Onset  . Breast cancer Sister 3  . Arthritis Mother   . Stroke Paternal Grandmother   . Cancer Brother 80       kidney  . Cancer Maternal Grandmother        Gallbladder    Social History   Socioeconomic History  . Marital status: Married    Spouse name: Not on file  . Number of children: Not on file  . Years of education: Not on file  . Highest education level: Not on file  Occupational History  . Not on file  Social Needs  . Financial resource strain: Not on file  . Food insecurity:    Worry: Not on file    Inability: Not on file  . Transportation needs:    Medical: Not on file    Non-medical: Not on file  Tobacco Use  . Smoking status: Never Smoker  . Smokeless tobacco: Never Used  Substance and Sexual Activity  . Alcohol use: No    Alcohol/week: 0.0 oz  . Drug use: No  . Sexual activity: Not Currently    Comment: Husband has prostate issues  Lifestyle  . Physical activity:    Days  per week: Not on file    Minutes per session: Not on file  . Stress: Not on file  Relationships  . Social connections:    Talks on phone: Not on file    Gets together: Not on file    Attends religious service: Not on file    Active member of club or organization: Not on file    Attends meetings of clubs or organizations: Not on file    Relationship status: Not on file  . Intimate partner violence:    Fear of current or ex partner: Not on file    Emotionally abused: Not on file    Physically abused: Not on file    Forced sexual activity: Not on file  Other Topics Concern  . Not on file  Social History Narrative  . Not on file     Current Outpatient Medications:  .  Cholecalciferol (VITAMIN D-3 PO), Take 1,000 Units by mouth daily. , Disp: , Rfl:  .  Cinnamon  500 MG capsule, Take 1,000 mg by mouth daily., Disp: , Rfl:  .  FISH OIL-BORAGE-FLAX-SAFFLOWER PO, Take 2 tablets by mouth daily., Disp: , Rfl:  .  fluticasone (FLONASE) 50 MCG/ACT nasal spray, Place 2 sprays into both nostrils daily. (Patient taking differently: Place 2 sprays into both nostrils daily. ), Disp: 16 g, Rfl: 1 .  ketoconazole (NIZORAL) 2 % cream, Apply 1 application topically daily. (Patient taking differently: Apply 1 application topically daily as needed. ), Disp: 60 g, Rfl: 0 .  loratadine (CLARITIN) 10 MG tablet, Take 1 tablet (10 mg total) by mouth daily. (Patient taking differently: Take 10 mg by mouth daily as needed. ), Disp: 90 tablet, Rfl: 1 .  Multiple Vitamins-Minerals (MULTIVITAMIN GUMMIES ADULT PO), Take by mouth daily., Disp: , Rfl:  .  POTASSIUM PO, Take 1 tablet by mouth daily. , Disp: , Rfl:  .  triamterene-hydrochlorothiazide (MAXZIDE-25) 37.5-25 MG tablet, Take 1 tablet by mouth daily., Disp: 90 tablet, Rfl: 1  Allergies  Allergen Reactions  . Altace [Ramipril] Swelling     ROS  Constitutional: Negative for fever or significant  weight change.  Respiratory: Negative for cough and shortness of breath.   Cardiovascular: Negative for chest pain or palpitations.  Gastrointestinal: Negative for abdominal pain, no bowel changes.  Musculoskeletal: Negative for gait problem or joint swelling.  Skin: Negative for rash.  Neurological: Negative for dizziness or headache.  No other specific complaints in a complete review of systems (except as listed in HPI above).   Objective  Vitals:   04/10/17 0754  BP: 130/80  Pulse: 77  Resp: 15  SpO2: 97%  Weight: 200 lb 4.8 oz (90.9 kg)  Height: 5\' 6"  (1.676 m)    Body mass index is 32.33 kg/m.  Physical Exam  Constitutional: Patient appears well-developed and well-nourished. Obese  No distress.  HEENT: head atraumatic, normocephalic, pupils equal and reactive to light,neck supple, throat within normal  limits Cardiovascular: Normal rate, regular rhythm and normal heart sounds.  No murmur heard. 1 plus  BLE edema. Pulmonary/Chest: Effort normal and breath sounds normal. No respiratory distress. Abdominal: Soft.  There is no tenderness. Psychiatric: Patient has a normal mood and affect. behavior is normal. Judgment and thought content normal.  Recent Results (from the past 2160 hour(s))  POCT HgB A1C     Status: Abnormal   Collection Time: 04/10/17  8:06 AM  Result Value Ref Range   Hemoglobin A1C 6.0      PHQ2/9:  Depression screen [facility] 2/9 10/11/2016 10/08/2016 04/09/2016 10/10/2015 04/06/2015  Decreased Interest 0 0 0 0 0  Down, Depressed, Hopeless 0 0 0 0 0  PHQ - 2 Score 0 0 0 0 0     Fall Risk: Fall Risk  04/10/2017 10/11/2016 10/08/2016 04/09/2016 10/10/2015  Falls in the past year? No No No No No     Functional Status Survey: Is the patient deaf or have difficulty hearing?: No Does the patient have difficulty seeing, even when wearing glasses/contacts?: No Does the patient have difficulty concentrating, remembering, or making decisions?: No Does the patient have difficulty walking or climbing stairs?: No Does the patient have difficulty dressing or bathing?: No Does the patient have difficulty doing errands alone such as visiting a doctor's office or shopping?: No   Assessment & Plan  1. Diabetes mellitus type 2, diet-controlled ([facility])  - POCT HgB A1C  2. Hypertension, benign  - triamterene-hydrochlorothiazide (MAXZIDE-25) 37.5-25 MG tablet; Take 1 tablet by mouth daily.  Dispense: 90 tablet; Refill: 1  3. Morbid obesity ([place])  She will try to get more active , she likes working in her yard  4. Vitamin D deficiency  At goal, based on last labs   5. Leg edema  Needs to resume compression stocking hoses  Reviewed PMHx and Social Hx

## 2017-04-25 ENCOUNTER — Encounter: Payer: Self-pay | Admitting: Family Medicine

## 2017-05-02 ENCOUNTER — Encounter: Payer: Self-pay | Admitting: Family Medicine

## 2017-05-14 DIAGNOSIS — H5203 Hypermetropia, bilateral: Secondary | ICD-10-CM | POA: Diagnosis not present

## 2017-05-14 DIAGNOSIS — H52223 Regular astigmatism, bilateral: Secondary | ICD-10-CM | POA: Diagnosis not present

## 2017-05-14 DIAGNOSIS — H2513 Age-related nuclear cataract, bilateral: Secondary | ICD-10-CM | POA: Diagnosis not present

## 2017-05-14 DIAGNOSIS — R739 Hyperglycemia, unspecified: Secondary | ICD-10-CM | POA: Diagnosis not present

## 2017-05-14 DIAGNOSIS — E119 Type 2 diabetes mellitus without complications: Secondary | ICD-10-CM | POA: Diagnosis not present

## 2017-05-28 ENCOUNTER — Ambulatory Visit
Admission: RE | Admit: 2017-05-28 | Discharge: 2017-05-28 | Disposition: A | Discharge status: Home / Self Care | Payer: PPO | Source: Ambulatory Visit | Attending: General Surgery | Admitting: General Surgery

## 2017-05-28 DIAGNOSIS — Z1231 Encounter for screening mammogram for malignant neoplasm of breast: Secondary | ICD-10-CM | POA: Insufficient documentation

## 2017-05-29 ENCOUNTER — Other Ambulatory Visit: Payer: Self-pay | Admitting: General Surgery

## 2017-05-29 DIAGNOSIS — N6320 Unspecified lump in the left breast, unspecified quadrant: Secondary | ICD-10-CM

## 2017-05-29 DIAGNOSIS — R928 Other abnormal and inconclusive findings on diagnostic imaging of breast: Secondary | ICD-10-CM

## 2017-05-29 DIAGNOSIS — N6489 Other specified disorders of breast: Secondary | ICD-10-CM

## 2017-06-04 ENCOUNTER — Ambulatory Visit (INDEPENDENT_AMBULATORY_CARE_PROVIDER_SITE_OTHER): Payer: PPO | Admitting: General Surgery

## 2017-06-04 ENCOUNTER — Encounter: Payer: Self-pay | Admitting: General Surgery

## 2017-06-04 VITALS — BP 130/72 | HR 72 | Resp 13 | Ht 66.0 in | Wt 195.0 lb

## 2017-06-04 DIAGNOSIS — Z803 Family history of malignant neoplasm of breast: Secondary | ICD-10-CM

## 2017-06-04 DIAGNOSIS — N6489 Other specified disorders of breast: Secondary | ICD-10-CM

## 2017-06-04 NOTE — Progress Notes (Signed)
Patient ID: Stacy [name], female   DOB: [birth year], 72 y.o.   MRN: [id number]  Chief Complaint  Patient presents with  . Follow-up    HPI Stacy [name] is a 72 y.o. female who presents for a breast evaluation former patient of Dr Jamal Collin. The most recent mammogram was done on 05/28/2017.  Patient does perform regular self breast checks and gets regular mammograms done.  Nol new breast issues. She is retired from [employer]. Her husband of 70 years is currently at rehab from recent hip replacement.  HPI  Past Medical History:  Diagnosis Date  . Hypertension   . Neoplasm of uncertain behavior of breast 2007    Past Surgical History:  Procedure Laterality Date  . BREAST BIOPSY Right 2015   3 stereo biopsies. 2 benign. one was radial scar, no excision done for radial scar  . BREAST EXCISIONAL BIOPSY Left 1968   x 3 per pt  . BREAST EXCISIONAL BIOPSY Right 2007  . COLONOSCOPY  2011   DR,ISHAKIS  . FOOT SURGERY  2004  . HEEL SPUR SURGERY Left 12/15/2014   Dr. Earleen Newport at [facility]    Family History  Problem Relation Age of Onset  . Breast cancer Sister 43  . Arthritis Mother   . Stroke Paternal Grandmother   . Cancer Brother 14       kidney  . Cancer Maternal Grandmother        Gallbladder    Social History Social History   Tobacco Use  . Smoking status: Never Smoker  . Smokeless tobacco: Never Used  Substance Use Topics  . Alcohol use: No    Alcohol/week: 0.0 oz  . Drug use: No    Allergies  Allergen Reactions  . Altace [Ramipril] Swelling    Current Outpatient Medications  Medication Sig Dispense Refill  . Cholecalciferol (VITAMIN D-3 PO) Take 1,000 Units by mouth daily.     . Cinnamon 500 MG capsule Take 1,000 mg by mouth daily.    Marland Kitchen FISH OIL-BORAGE-FLAX-SAFFLOWER PO Take 2 tablets by mouth daily.    . fluticasone (FLONASE) 50 MCG/ACT nasal spray Place 2 sprays into both nostrils daily. (Patient taking differently: Place 2 sprays into both  nostrils daily. ) 16 g 1  . ketoconazole (NIZORAL) 2 % cream Apply 1 application topically daily. (Patient taking differently: Apply 1 application topically daily as needed. ) 60 g 0  . loratadine (CLARITIN) 10 MG tablet Take 1 tablet (10 mg total) by mouth daily. (Patient taking differently: Take 10 mg by mouth daily as needed. ) 90 tablet 1  . Multiple Vitamins-Minerals (MULTIVITAMIN GUMMIES ADULT PO) Take by mouth daily.    Marland Kitchen POTASSIUM PO Take 1 tablet by mouth daily.     Marland Kitchen triamterene-hydrochlorothiazide (MAXZIDE-25) 37.5-25 MG tablet Take 1 tablet by mouth daily. 90 tablet 1   No current facility-administered medications for this visit.     Review of Systems Review of Systems  Blood pressure 130/72, pulse 72, resp. rate 13, height 5\' 6"  (1.676 m), weight 195 lb (88.5 kg).  Physical Exam Physical Exam  Constitutional: She is oriented to person, place, and time. She appears well-developed and well-nourished.  HENT:  Mouth/Throat: Oropharynx is clear and moist. No oropharyngeal exudate.  Eyes: Conjunctivae are normal. No scleral icterus.  Neck: Normal range of motion. Neck supple.  Cardiovascular: Normal rate, regular rhythm and normal heart sounds.  Pulmonary/Chest: Effort normal and breath sounds normal. Right breast exhibits no inverted nipple, no mass,  no nipple discharge, no skin change and no tenderness. Left breast exhibits no inverted nipple, no mass, no nipple discharge, no skin change and no tenderness.  Lymphadenopathy:    She has no cervical adenopathy.    She has no axillary adenopathy.  Neurological: She is alert and oriented to person, place, and time.  Skin: Skin is warm and dry.  Psychiatric: Her behavior is normal.    Data Reviewed Bilateral screening mammograms of May 28, 2017 were reviewed.  Interval change bilaterally.  BI-RADS-0.  Assessment    Interval change in mammogram warranting additional views and likely ultrasound.  Left lesion likely cystic.     Plan    Added views with possible ultrasound with radiology. Follow up pending     HPI, Physical Exam, Assessment and Plan have been scribed under the direction and in the presence of Robert Bellow, MD. Karie Fetch, RN  I have completed the exam and reviewed the above documentation for accuracy and completeness.  I agree with the above.  Haematologist has been used and any errors in dictation or transcription are unintentional.  Hervey Ard, M.D., F.A.C.S.   Forest Gleason Remmie Bembenek 06/04/2017, 9:25 PM

## 2017-06-04 NOTE — Patient Instructions (Addendum)
The patient is aware to call back for any questions or concerns.  Added views with possible ultrasound with radiology

## 2017-06-12 ENCOUNTER — Ambulatory Visit
Admission: RE | Admit: 2017-06-12 | Discharge: 2017-06-12 | Disposition: A | Discharge status: Home / Self Care | Payer: PPO | Source: Ambulatory Visit | Attending: General Surgery | Admitting: General Surgery

## 2017-06-12 DIAGNOSIS — N6320 Unspecified lump in the left breast, unspecified quadrant: Secondary | ICD-10-CM

## 2017-06-12 DIAGNOSIS — N6489 Other specified disorders of breast: Secondary | ICD-10-CM

## 2017-06-12 DIAGNOSIS — R928 Other abnormal and inconclusive findings on diagnostic imaging of breast: Secondary | ICD-10-CM

## 2017-06-12 DIAGNOSIS — N6323 Unspecified lump in the left breast, lower outer quadrant: Secondary | ICD-10-CM | POA: Diagnosis not present

## 2017-06-13 ENCOUNTER — Telehealth: Payer: Self-pay | Admitting: *Deleted

## 2017-06-13 NOTE — Telephone Encounter (Signed)
Patient contacted today to arrange for a left breast office biopsy.   Her husband is presently in rehab right now and patient would like to postpone biopsy until he is available to come with her to the appointment.   An appointment has been scheduled for a left breast ultrasound guided core biopsy for 07-16-17 at 8:30 am.

## 2017-06-13 NOTE — Telephone Encounter (Signed)
-----   Message from Robert Bellow, MD sent at 06/13/2017  8:21 AM EDT ----- Needs a f/u for office biopsy of the left breast.  ----- Message ----- From: Interface, Rad Results In Sent: 06/12/2017   1:00 PM To: Robert Bellow, MD

## 2017-07-16 ENCOUNTER — Ambulatory Visit: Payer: Self-pay

## 2017-07-16 ENCOUNTER — Ambulatory Visit (INDEPENDENT_AMBULATORY_CARE_PROVIDER_SITE_OTHER): Payer: PPO | Admitting: General Surgery

## 2017-07-16 ENCOUNTER — Encounter: Payer: Self-pay | Admitting: General Surgery

## 2017-07-16 VITALS — BP 140/70 | HR 68 | Resp 14 | Ht 67.0 in | Wt 198.0 lb

## 2017-07-16 DIAGNOSIS — N6323 Unspecified lump in the left breast, lower outer quadrant: Secondary | ICD-10-CM | POA: Diagnosis not present

## 2017-07-16 DIAGNOSIS — N649 Disorder of breast, unspecified: Secondary | ICD-10-CM | POA: Diagnosis not present

## 2017-07-16 HISTORY — PX: BREAST BIOPSY: SHX20

## 2017-07-16 NOTE — Progress Notes (Signed)
Patient ID: Stacy [name], female   DOB: 1945/08/15, 72 y.o.   MRN: [id number]  Chief Complaint  Patient presents with  . Procedure    HPI Stacy [name] is a 72 y.o. female.  Here for left breast biopsy.  At the time of the patient's Jun 04, 2017 visit she was due for additional views and these of been completed.  This showed a hypoechoic mass in the lower outer quadrant of the left breast.  Plans are to proceed with biopsy.  HPI  Past Medical History:  Diagnosis Date  . Hypertension   . Neoplasm of uncertain behavior of breast 2007    Past Surgical History:  Procedure Laterality Date  . BREAST BIOPSY Right 2015   3 stereo biopsies. 2 benign. one was radial scar, no excision done for radial scar  . BREAST EXCISIONAL BIOPSY Left 1968   x 3 per pt  . BREAST EXCISIONAL BIOPSY Right 2007   benign  . COLONOSCOPY  2011   DR,ISHAKIS  . FOOT SURGERY  2004  . HEEL SPUR SURGERY Left 12/15/2014   Dr. Earleen Newport at [facility]    Family History  Problem Relation Age of Onset  . Breast cancer Sister 55  . Arthritis Mother   . Stroke Paternal Grandmother   . Cancer Brother 48       kidney  . Cancer Maternal Grandmother        Gallbladder    Social History Social History   Tobacco Use  . Smoking status: Never Smoker  . Smokeless tobacco: Never Used  Substance Use Topics  . Alcohol use: No    Alcohol/week: 0.0 oz  . Drug use: No    Allergies  Allergen Reactions  . Altace [Ramipril] Swelling    Current Outpatient Medications  Medication Sig Dispense Refill  . Cholecalciferol (VITAMIN D-3 PO) Take 1,000 Units by mouth daily.     . Cinnamon 500 MG capsule Take 1,000 mg by mouth daily.    Marland Kitchen FISH OIL-BORAGE-FLAX-SAFFLOWER PO Take 2 tablets by mouth daily.    . fluticasone (FLONASE) 50 MCG/ACT nasal spray Place 2 sprays into both nostrils daily. (Patient taking differently: Place 2 sprays into both nostrils daily. ) 16 g 1  . ketoconazole (NIZORAL) 2 % cream Apply  1 application topically daily. (Patient taking differently: Apply 1 application topically daily as needed. ) 60 g 0  . loratadine (CLARITIN) 10 MG tablet Take 1 tablet (10 mg total) by mouth daily. (Patient taking differently: Take 10 mg by mouth daily as needed. ) 90 tablet 1  . Multiple Vitamins-Minerals (MULTIVITAMIN GUMMIES ADULT PO) Take by mouth daily.    Marland Kitchen POTASSIUM PO Take 1 tablet by mouth daily.     Marland Kitchen triamterene-hydrochlorothiazide (MAXZIDE-25) 37.5-25 MG tablet Take 1 tablet by mouth daily. 90 tablet 1   No current facility-administered medications for this visit.     Review of Systems Review of Systems  Constitutional: Negative.   Respiratory: Negative.   Cardiovascular: Negative.     Blood pressure 140/70, pulse 68, resp. rate 14, height 5\' 7"  (1.702 m), weight 198 lb (89.8 kg), SpO2 98 %.  Physical Exam Physical Exam  Constitutional: She is oriented to person, place, and time. She appears well-developed and well-nourished.  Neurological: She is alert and oriented to person, place, and time.  Skin: Skin is warm and dry.  Psychiatric: Her behavior is normal.  .  Data Reviewed The additional views and ultrasound were reviewed.  Ultrasound  examination today showed a hypoechoic mass slightly taller than wide in the 5 o'clock position of the left breast 4 cm from the nipple.  This measures 0.93 x 1.05 x 1.05 cm.  The procedure was reviewed and she was amenable to proceed.  A total of 10 cc of 0.5% Xylocaine with 0.25% Marcaine with 1 200,000 notes of epinephrine was utilized and well-tolerated after an alcohol prep.  ChloraPrep was applied to the skin.  A 10-gauge Encor vacuum biopsy device was placed into the lesion and with approximately 15 samples removed in its entirety.  No bleeding was noted.  A postbiopsy clip was placed.  The skin defect was closed with a single Steri-Strip followed by Telfa and Tegaderm dressing.  The procedure was well-tolerated.  Ice pack  provided.  Assessment    Good tolerance of a vacuum biopsy of a left lower outer quadrant lesion.    Plan     Wound care was reviewed with the patient by the nurse.  The patient will be contacted when pathology is available.       HPI, Physical Exam, Assessment and Plan have been scribed under the direction and in the presence of Robert Bellow, MD. Karie Fetch, RN  I have completed the exam and reviewed the above documentation for accuracy and completeness.  I agree with the above.  Haematologist has been used and any errors in dictation or transcription are unintentional.  Hervey Ard, M.D., F.A.C.S.  Forest Gleason Byrnett 07/16/2017, 6:27 PM

## 2017-07-16 NOTE — Patient Instructions (Addendum)
The patient is aware to call back for any questions or new concerns.    CARE AFTER BREAST BIOPSY  1. Leave the dressing on that your doctor applied after the biopsy. It is waterproof. You may bathe, shower and/or swim. The dressing can be removed in 3 days, you will see small strips of tape against your skin on the incision. Do not remove these strips they will gradually fall off in about 2-3 weeks. You may use an ice pack on and off for the first 12-24 hours for comfort.  2. You may want to use a gauze,cloth or similar protection in your bra to prevent rubbing against your dressing and incision. This is not necessary, but you may feel more comfortable doing so.  3. It is recommended that you wear a bra day and night to give support to the breast. This will prevent the weight of the breast from pulling on the incision.  4. Your breast may feel hard and lumpy under the incision. Do not be alarmed. This is the underlying stitching of tissue. Softening of this tissue will occur in time.  5. You may have a follow up appointment or phone follow up in one week after your biopsy. The office phone number is [phone].  6. You will notice about a week or two after your office visit that the strips of the tape on your incision will begin to loosen. These may then be removed.  7. Report to your doctor any of the following:  * Severe pain not relieved by your pain medication  *Redness of the incision  * Drainage from the incision  *Fever greater than 101 degrees  

## 2017-07-17 ENCOUNTER — Telehealth: Payer: Self-pay | Admitting: General Surgery

## 2017-07-17 NOTE — Telephone Encounter (Signed)
A message was left on the patient's home number (cell not answered) that her biopsy results were good.  She was encouraged to call back for details.  She is scheduled for a follow-up visit with the nurse in 1 week.  We will arrange for a physician exam in 1 month for reassessment post vacuum resection.

## 2017-07-17 NOTE — Telephone Encounter (Signed)
Notified spouse as noted. Follow up appointment will be made at nurse visit.

## 2017-07-23 ENCOUNTER — Ambulatory Visit (INDEPENDENT_AMBULATORY_CARE_PROVIDER_SITE_OTHER): Payer: PPO | Admitting: *Deleted

## 2017-07-23 DIAGNOSIS — N6323 Unspecified lump in the left breast, lower outer quadrant: Secondary | ICD-10-CM

## 2017-07-23 NOTE — Progress Notes (Signed)
Patient here today for follow up post breast biopsy.  Dressing removed, steristrip in place and aware it may come off in one week.  Minimal bruising noted.  The patient is aware that a heating pad may be used for comfort as needed. Follow up as scheduled.

## 2017-07-23 NOTE — Patient Instructions (Signed)
Return in one month.  

## 2017-08-22 ENCOUNTER — Ambulatory Visit (INDEPENDENT_AMBULATORY_CARE_PROVIDER_SITE_OTHER): Payer: PPO | Admitting: General Surgery

## 2017-08-22 ENCOUNTER — Encounter: Payer: Self-pay | Admitting: General Surgery

## 2017-08-22 VITALS — BP 108/60 | HR 73 | Resp 16 | Ht 67.0 in | Wt 198.0 lb

## 2017-08-22 DIAGNOSIS — D242 Benign neoplasm of left breast: Secondary | ICD-10-CM

## 2017-08-22 NOTE — Patient Instructions (Addendum)
The patient is aware to call back for any questions or new concerns.  

## 2017-08-22 NOTE — Progress Notes (Signed)
Patient ID: Nataya Bastedo, female   DOB: 29-Oct-1945, 72 y.o.   MRN: [id number]  Chief Complaint  Patient presents with  . Routine Post Op    HPI Carmelle Bamberg is a 72 y.o. female. here today for follow up post left breast biopsy on 07-16-17, biphasic stromal and epithelial mass. She states she is doing well. No complaints.  HPI  Past Medical History:  Diagnosis Date  . Hypertension   . Neoplasm of uncertain behavior of breast 2007    Past Surgical History:  Procedure Laterality Date  . BREAST BIOPSY Right 2015   3 stereo biopsies. 2 benign. one was radial scar, no excision done for radial scar  . BREAST BIOPSY Left 07/16/2017   BIPHASIC STROMAL AND EPITHELIAL LESION./ Dr Bary Castilla  . BREAST EXCISIONAL BIOPSY Left 1968   x 3 per pt  . BREAST EXCISIONAL BIOPSY Right 2007   benign  . COLONOSCOPY  2011   DR,ISHAKIS  . FOOT SURGERY  2004  . HEEL SPUR SURGERY Left 12/15/2014   Dr. Earleen Newport at [facility]    Family History  Problem Relation Age of Onset  . Breast cancer Sister 50  . Arthritis Mother   . Stroke Paternal Grandmother   . Cancer Brother 8       kidney  . Cancer Maternal Grandmother        Gallbladder    Social History Social History   Tobacco Use  . Smoking status: Never Smoker  . Smokeless tobacco: Never Used  Substance Use Topics  . Alcohol use: No    Alcohol/week: 0.0 standard drinks  . Drug use: No    Allergies  Allergen Reactions  . Altace [Ramipril] Swelling    Current Outpatient Medications  Medication Sig Dispense Refill  . Cholecalciferol (VITAMIN D-3 PO) Take 1,000 Units by mouth daily.     . Cinnamon 500 MG capsule Take 1,000 mg by mouth daily.    Marland Kitchen FISH OIL-BORAGE-FLAX-SAFFLOWER PO Take 2 tablets by mouth daily.    . fluticasone (FLONASE) 50 MCG/ACT nasal spray Place 2 sprays into both nostrils daily. (Patient taking differently: Place 2 sprays into both nostrils daily. ) 16 g 1  . ketoconazole (NIZORAL) 2 % cream Apply 1  application topically daily. (Patient taking differently: Apply 1 application topically daily as needed. ) 60 g 0  . loratadine (CLARITIN) 10 MG tablet Take 1 tablet (10 mg total) by mouth daily. (Patient taking differently: Take 10 mg by mouth daily as needed. ) 90 tablet 1  . Multiple Vitamins-Minerals (MULTIVITAMIN GUMMIES ADULT PO) Take by mouth daily.    Marland Kitchen POTASSIUM PO Take 1 tablet by mouth daily.     Marland Kitchen triamterene-hydrochlorothiazide (MAXZIDE-25) 37.5-25 MG tablet Take 1 tablet by mouth daily. 90 tablet 1   No current facility-administered medications for this visit.     Review of Systems Review of Systems  Constitutional: Negative.   Respiratory: Negative.   Cardiovascular: Negative.     Blood pressure 108/60, pulse 73, resp. rate 16, height 5\' 7"  (1.702 m), weight 198 lb (89.8 kg), SpO2 97 %.  Physical Exam Physical Exam  Constitutional: She is oriented to person, place, and time. She appears well-developed and well-nourished.  Pulmonary/Chest:  Mild 1 cm thickening at left breast biopsy site at 5 o'clock 4 CFN    Neurological: She is alert and oriented to person, place, and time.  Skin: Skin is warm and dry.  Psychiatric: Her behavior is normal.  Data Reviewed Diagnosis Breast, left, needle core biopsy, 5 o'clock - BIPHASIC STROMAL AND EPITHELIAL LESION.   The lesion was completely removed at the time of biopsy.  Assessment    Doing well status post removal of a complex fibroadenoma from the left breast    Plan    Follow up in 5 monthswith office ultrasound      HPI, Physical Exam, Assessment and Plan have been scribed under the direction and in the presence of Robert Bellow, MD. Karie Fetch, RN I have completed the exam and reviewed the above documentation for accuracy and completeness.  I agree with the above.  Haematologist has been used and any errors in dictation or transcription are unintentional.  Hervey Ard, M.D.,  F.A.C.S.  Forest Gleason Etty Isaac 08/23/2017, 3:16 PM

## 2017-10-11 ENCOUNTER — Encounter: Payer: Self-pay | Admitting: Family Medicine

## 2017-10-11 ENCOUNTER — Ambulatory Visit (INDEPENDENT_AMBULATORY_CARE_PROVIDER_SITE_OTHER): Payer: PPO | Admitting: Family Medicine

## 2017-10-11 ENCOUNTER — Ambulatory Visit (INDEPENDENT_AMBULATORY_CARE_PROVIDER_SITE_OTHER): Payer: PPO

## 2017-10-11 VITALS — BP 112/68 | HR 72 | Temp 98.1°F | Resp 14 | Ht 67.0 in | Wt 193.9 lb

## 2017-10-11 DIAGNOSIS — E669 Obesity, unspecified: Secondary | ICD-10-CM | POA: Diagnosis not present

## 2017-10-11 DIAGNOSIS — R600 Localized edema: Secondary | ICD-10-CM | POA: Diagnosis not present

## 2017-10-11 DIAGNOSIS — Z6830 Body mass index (BMI) 30.0-30.9, adult: Secondary | ICD-10-CM

## 2017-10-11 DIAGNOSIS — Z23 Encounter for immunization: Secondary | ICD-10-CM | POA: Diagnosis not present

## 2017-10-11 DIAGNOSIS — E661 Drug-induced obesity: Secondary | ICD-10-CM

## 2017-10-11 DIAGNOSIS — Z0000 Encounter for general adult medical examination without abnormal findings: Secondary | ICD-10-CM | POA: Diagnosis not present

## 2017-10-11 DIAGNOSIS — E1169 Type 2 diabetes mellitus with other specified complication: Secondary | ICD-10-CM

## 2017-10-11 DIAGNOSIS — I10 Essential (primary) hypertension: Secondary | ICD-10-CM | POA: Diagnosis not present

## 2017-10-11 DIAGNOSIS — E559 Vitamin D deficiency, unspecified: Secondary | ICD-10-CM | POA: Diagnosis not present

## 2017-10-11 MED ORDER — TRIAMTERENE-HCTZ 37.5-25 MG PO TABS: 1.0000 | ORAL_TABLET | Freq: Every day | ORAL | 1 refills | Status: DC

## 2017-10-11 NOTE — Patient Instructions (Signed)
Stacy [name] , Thank you for taking time to come for your Medicare Wellness Visit. I appreciate your ongoing commitment to your health goals. Please review the following plan we discussed and let me know if I can assist you in the future.   Screening recommendations/referrals: Colorectal Screening: Up to date Mammogram: Up to date Bone Density: Declined  Vision and Dental Exams: Recommended annual ophthalmology exams for early detection of glaucoma and other disorders of the eye Recommended annual dental exams for proper oral hygiene  Diabetic Exams: Diabetic Eye Exam: Please call to schedule your appointment Diabetic Foot Exam: To be completed today  Vaccinations: Influenza vaccine: Completed today Pneumococcal vaccine: Up to date Tdap vaccine: Please call your insurance company to determine your out of pocket expense. You may also receive this vaccine at your local pharmacy or Health Dept. Shingles vaccine: Please call your insurance company to determine your out of pocket expense for the Shingrix vaccine. You may receive this vaccine at your local pharmacy.  Advanced directives: Advance directives discussed with you today. I have provided a copy for you to complete at home and have notarized. Once this is complete please bring a copy in to our office so we can scan it into your chart.  Goals: Recommend to eliminate sweets and to decrease portion sizes by eating 3 small healthy meals and at least 2 healthy snacks per day.  Next appointment: Please schedule your Annual Wellness Visit with your Nurse Health Advisor in one year.  Preventive Care 40 Years and Older, Female Preventive care refers to lifestyle choices and visits with your health care provider that can promote health and wellness. What does preventive care include?  A yearly physical exam. This is also called an annual well check.  Dental exams once or twice a year.  Routine eye exams. Ask your health care provider how  often you should have your eyes checked.  Personal lifestyle choices, including:  Daily care of your teeth and gums.  Regular physical activity.  Eating a healthy diet.  Avoiding tobacco and drug use.  Limiting alcohol use.  Practicing safe sex.  Taking low-dose aspirin every day if recommended by your health care provider.  Taking vitamin and mineral supplements as recommended by your health care provider. What happens during an annual well check? The services and screenings done by your health care provider during your annual well check will depend on your age, overall health, lifestyle risk factors, and family history of disease. Counseling  Your health care provider may ask you questions about your:  Alcohol use.  Tobacco use.  Drug use.  Emotional well-being.  Home and relationship well-being.  Sexual activity.  Eating habits.  History of falls.  Memory and ability to understand (cognition).  Work and work Statistician.  Reproductive health. Screening  You may have the following tests or measurements:  Height, weight, and BMI.  Blood pressure.  Lipid and cholesterol levels. These may be checked every 5 years, or more frequently if you are over 8 years old.  Skin check.  Lung cancer screening. You may have this screening every year starting at age 47 if you have a 30-pack-year history of smoking and currently smoke or have quit within the past 15 years.  Fecal occult blood test (FOBT) of the stool. You may have this test every year starting at age 27.  Flexible sigmoidoscopy or colonoscopy. You may have a sigmoidoscopy every 5 years or a colonoscopy every 10 years starting at age 33.  Hepatitis C blood test.  Hepatitis B blood test.  Sexually transmitted disease (STD) testing.  Diabetes screening. This is done by checking your blood sugar (glucose) after you have not eaten for a while (fasting). You may have this done every 1-3 years.  Bone  density scan. This is done to screen for osteoporosis. You may have this done starting at age 40.  Mammogram. This may be done every 1-2 years. Talk to your health care provider about how often you should have regular mammograms. Talk with your health care provider about your test results, treatment options, and if necessary, the need for more tests. Vaccines  Your health care provider may recommend certain vaccines, such as:  Influenza vaccine. This is recommended every year.  Tetanus, diphtheria, and acellular pertussis (Tdap, Td) vaccine. You may need a Td booster every 10 years.  Zoster vaccine. You may need this after age 24.  Pneumococcal 13-valent conjugate (PCV13) vaccine. One dose is recommended after age 76.  Pneumococcal polysaccharide (PPSV23) vaccine. One dose is recommended after age 23. Talk to your health care provider about which screenings and vaccines you need and how often you need them. This information is not intended to replace advice given to you by your health care provider. Make sure you discuss any questions you have with your health care provider. Document Released: 01/28/2015 Document Revised: 09/21/2015 Document Reviewed: 11/02/2014 Elsevier Interactive Patient Education  2017 [place] Prevention in the Home Falls can cause injuries. They can happen to people of all ages. There are many things you can do to make your home safe and to help prevent falls. What can I do on the outside of my home?  Regularly fix the edges of walkways and driveways and fix any cracks.  Remove anything that might make you trip as you walk through a door, such as a raised step or threshold.  Trim any bushes or trees on the path to your home.  Use bright outdoor lighting.  Clear any walking paths of anything that might make someone trip, such as rocks or tools.  Regularly check to see if handrails are loose or broken. Make sure that both sides of any steps have  handrails.  Any raised decks and porches should have guardrails on the edges.  Have any leaves, snow, or ice cleared regularly.  Use sand or salt on walking paths during winter.  Clean up any spills in your garage right away. This includes oil or grease spills. What can I do in the bathroom?  Use night lights.  Install grab bars by the toilet and in the tub and shower. Do not use towel bars as grab bars.  Use non-skid mats or decals in the tub or shower.  If you need to sit down in the shower, use a plastic, non-slip stool.  Keep the floor dry. Clean up any water that spills on the floor as soon as it happens.  Remove soap buildup in the tub or shower regularly.  Attach bath mats securely with double-sided non-slip rug tape.  Do not have throw rugs and other things on the floor that can make you trip. What can I do in the bedroom?  Use night lights.  Make sure that you have a light by your bed that is easy to reach.  Do not use any sheets or blankets that are too big for your bed. They should not hang down onto the floor.  Have a firm chair that has side  arms. You can use this for support while you get dressed.  Do not have throw rugs and other things on the floor that can make you trip. What can I do in the kitchen?  Clean up any spills right away.  Avoid walking on wet floors.  Keep items that you use a lot in easy-to-reach places.  If you need to reach something above you, use a strong step stool that has a grab bar.  Keep electrical cords out of the way.  Do not use floor polish or wax that makes floors slippery. If you must use wax, use non-skid floor wax.  Do not have throw rugs and other things on the floor that can make you trip. What can I do with my stairs?  Do not leave any items on the stairs.  Make sure that there are handrails on both sides of the stairs and use them. Fix handrails that are broken or loose. Make sure that handrails are as long as  the stairways.  Check any carpeting to make sure that it is firmly attached to the stairs. Fix any carpet that is loose or worn.  Avoid having throw rugs at the top or bottom of the stairs. If you do have throw rugs, attach them to the floor with carpet tape.  Make sure that you have a light switch at the top of the stairs and the bottom of the stairs. If you do not have them, ask someone to add them for you. What else can I do to help prevent falls?  Wear shoes that:  Do not have high heels.  Have rubber bottoms.  Are comfortable and fit you well.  Are closed at the toe. Do not wear sandals.  If you use a stepladder:  Make sure that it is fully opened. Do not climb a closed stepladder.  Make sure that both sides of the stepladder are locked into place.  Ask someone to hold it for you, if possible.  Clearly mark and make sure that you can see:  Any grab bars or handrails.  First and last steps.  Where the edge of each step is.  Use tools that help you move around (mobility aids) if they are needed. These include:  Canes.  Walkers.  Scooters.  Crutches.  Turn on the lights when you go into a dark area. Replace any light bulbs as soon as they burn out.  Set up your furniture so you have a clear path. Avoid moving your furniture around.  If any of your floors are uneven, fix them.  If there are any pets around you, be aware of where they are.  Review your medicines with your doctor. Some medicines can make you feel dizzy. This can increase your chance of falling. Ask your doctor what other things that you can do to help prevent falls. This information is not intended to replace advice given to you by your health care provider. Make sure you discuss any questions you have with your health care provider. Document Released: 10/28/2008 Document Revised: 06/09/2015 Document Reviewed: 02/05/2014 Elsevier Interactive Patient Education  2017 [employer].

## 2017-10-11 NOTE — Progress Notes (Signed)
Subjective:   Stacy [name] is a 72 y.o. female who presents for Medicare Annual (Subsequent) preventive examination.  Review of Systems:  N/A Cardiac Risk Factors include: advanced age (>69men, >51 women);diabetes mellitus;hypertension;obesity (BMI >30kg/m2);sedentary lifestyle     Objective:     Vitals: BP 112/68 (BP Location: Left Arm, Patient Position: Sitting, Cuff Size: Normal)   Pulse 72   Temp 98.1 F (36.7 C) (Oral)   Resp 14   Ht 5\' 7"  (1.702 m)   Wt 193 lb 14.4 oz (88 kg)   SpO2 94%   BMI 30.37 kg/m   Body mass index is 30.37 kg/m.  Advanced Directives 10/11/2017 10/08/2016 04/09/2016 10/10/2015 04/06/2015 10/07/2014  Does Patient Have a Medical Advance Directive? No No No No No No  Would patient like information on creating a medical advance directive? Yes (MAU/Ambulatory/Procedural Areas - Information given) No - Patient declined - No - patient declined information No - patient declined information -    Tobacco Social History   Tobacco Use  Smoking Status Never Smoker  Smokeless Tobacco Never Used  Tobacco Comment   smoking cessation materials not required     Counseling given: No Comment: smoking cessation materials not required  Clinical Intake:  Pre-visit preparation completed: Yes  Pain : No/denies pain   BMI - recorded: 30.37 Nutritional Status: BMI > 30  Obese Nutritional Risks: None  Nutrition Risk Assessment:  Has the patient had any N/V/D within the last 2 months?  No  Does the patient have any non-healing wounds?  No  Has the patient had any unintentional weight loss or weight gain?  No   Diabetes:  Is the patient diabetic?  Yes  If diabetic, was a CBG obtained today?  No  Did the patient bring in their glucometer from home?  No  How often do you monitor your CBG's? prn.   Financial Strains and Diabetes Management:  Are you having any financial strains with the device, your supplies or your medication? No .  Does the patient  want to be seen by Chronic Care Management for management of their diabetes?  No  Would the patient like to be referred to a Nutritionist or for Diabetic Management?  No   Diabetic Exams:  Diabetic Eye Exam: Completed 05/14/16. Overdue for diabetic eye exam. Pt states she is scheduled to be seen Oct 29. Diabetic Foot Exam: Completed 10/11/16. Pt has been advised about the importance in completing this exam. Pt is scheduled for diabetic foot exam today.  How often do you need to have someone help you when you read instructions, pamphlets, or other written materials from your doctor or pharmacy?: 1 - Never  Interpreter Needed?: No  Information entered by :: Idell Pickles, LPN  Past Medical History:  Diagnosis Date  . Hypertension   . Neoplasm of uncertain behavior of breast 2007   Past Surgical History:  Procedure Laterality Date  . BREAST BIOPSY Right 2015   3 stereo biopsies. 2 benign. one was radial scar, no excision done for radial scar  . BREAST BIOPSY Left 07/16/2017   BIPHASIC STROMAL AND EPITHELIAL LESION./ Dr Bary Castilla  . BREAST EXCISIONAL BIOPSY Left 1968   x 3 per pt  . BREAST EXCISIONAL BIOPSY Right 2007   benign  . COLONOSCOPY  2011   DR,ISHAKIS  . FOOT SURGERY  2004  . HEEL SPUR SURGERY Left 12/15/2014   Dr. Earleen Newport at [facility]   Family History  Problem Relation Age of Onset  .  Breast cancer Sister 47  . Arthritis Mother   . Stroke Paternal Grandmother   . Cancer Brother 14       kidney  . Cancer Maternal Grandmother        Gallbladder   Social History   Socioeconomic History  . Marital status: Married    Spouse name: Richard  . Number of children: 1  . Years of education: Not on file  . Highest education level: 12th grade  Occupational History  . 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 Financial resource strain: Not hard at all  . Food insecurity:    Worry: Never true    Inability: Never true  . Transportation needs:     Medical: No    Non-medical: No  Tobacco Use  . Smoking status: Never Smoker  . Smokeless tobacco: Never Used  . Tobacco comment: smoking cessation materials not required  Substance and Sexual Activity  . Alcohol use: No    Alcohol/week: 0.0 standard drinks  . Drug use: No  . Sexual activity: Not Currently    Comment: Husband has prostate issues  Lifestyle  . Physical activity:    Days per week: 0 days    Minutes per session: 0 min  . Stress: Not at all  Relationships  . Social connections:    Talks on phone: Patient refused    Gets together: Patient refused    Attends religious service: Patient refused    Active member of club or organization: Patient refused    Attends meetings of clubs or organizations: Patient refused    Relationship status: Married  Other Topics Concern  . Not on file  Social History Narrative  . Not on file    Outpatient Encounter Medications as of 10/11/2017  Medication Sig  . Cholecalciferol (VITAMIN D-3 PO) Take 1,000 Units by mouth daily.   . Cinnamon 500 MG capsule Take 1,000 mg by mouth daily.  Marland Kitchen FISH OIL-BORAGE-FLAX-SAFFLOWER PO Take 2 tablets by mouth daily.  . fluticasone (FLONASE) 50 MCG/ACT nasal spray Place 2 sprays into both nostrils daily. (Patient taking differently: Place 2 sprays into both nostrils daily. )  . ketoconazole (NIZORAL) 2 % cream Apply 1 application topically daily. (Patient taking differently: Apply 1 application topically daily as needed. )  . loratadine (CLARITIN) 10 MG tablet Take 1 tablet (10 mg total) by mouth daily. (Patient taking differently: Take 10 mg by mouth daily as needed. )  . Multiple Vitamins-Minerals (MULTIVITAMIN GUMMIES ADULT PO) Take by mouth daily.  Marland Kitchen POTASSIUM PO Take 1 tablet by mouth daily.   Marland Kitchen triamterene-hydrochlorothiazide (MAXZIDE-25) 37.5-25 MG tablet Take 1 tablet by mouth daily.   No facility-administered encounter medications on file as of 10/11/2017.     Activities of Daily Living In  your present state of health, do you have any difficulty performing the following activities: 10/11/2017 04/10/2017  Hearing? N N  Comment denies hearing aids -  Vision? N N  Comment wears eyeglasses -  Difficulty concentrating or making decisions? N N  Walking or climbing stairs? N N  Dressing or bathing? N N  Doing errands, shopping? N N  Preparing Food and eating ? N -  Comment partial upper dentures -  Using the Toilet? N -  In the past six months, have you accidently leaked urine? N -  Do you have problems with loss of bowel control? N -  Managing your Medications? N -  Managing  your Finances? N -  Housekeeping or managing your Housekeeping? N -  Some recent data might be hidden    Patient Care Team: Steele Sizer, MD as PCP - General Nice, Reed Breech, OD as Consulting Physician (Optometry) Bary Castilla, Forest Gleason, MD as Consulting Physician (General Surgery)    Assessment:   This is a routine wellness examination for Stacy [name].  Exercise Activities and Dietary recommendations Current Exercise Habits: The patient does not participate in regular exercise at present, Exercise limited by: None identified  Goals    . DIET - REDUCE SUGAR INTAKE     Recommend to eliminate sweets and to decrease portion sizes by eating 3 small healthy meals and at least 2 healthy snacks per day.     . Exercise 3x per week (20-30 min per time)     Recommend to exercise 3x per week for 20-30 min per time.       Fall Risk Fall Risk  10/11/2017 04/10/2017 10/11/2016 10/08/2016 04/09/2016  Falls in the past year? No No No No No  Risk for fall due to : Impaired vision - - - -  Risk for fall due to: Comment wears eyeglasses - - - -   FALL RISK PREVENTION PERTAINING TO THE HOME:  Any stairs in or around the home WITH handrails? Yes  Home free of loose throw rugs in walkways, pet beds, electrical cords, etc? Yes  Adequate lighting in your home to reduce risk of falls? Yes   ASSISTIVE DEVICES UTILIZED TO  PREVENT FALLS:  Life alert? No  Use of a cane, walker or w/c? No  Grab bars in the bathroom? Yes  Shower chair or bench in shower? Yes  Elevated toilet seat or a handicapped toilet? Yes   DME ORDERS:  DME order needed?  No   TIMED UP AND GO:  Was the test performed? Yes .  Length of time to ambulate 10 feet: 12 sec.   GAIT:  Appearance of gait: Gait stead-fast and without the use of an assistive device. Education: Fall risk prevention has been discussed.  Intervention(s) required? No   Depression Screen PHQ 2/9 Scores 10/11/2017 10/11/2016 10/08/2016 04/09/2016  PHQ - 2 Score 0 0 0 0  PHQ- 9 Score 0 - - -     Cognitive Function     6CIT Screen 10/11/2017 10/08/2016  What Year? 0 points 0 points  What month? 0 points 0 points  What time? 0 points 0 points  Count back from 20 0 points 0 points  Months in reverse 0 points 0 points  Repeat phrase 0 points 4 points  Total Score 0 4    Immunization History  Administered Date(s) Administered  . Influenza, High Dose Seasonal PF 10/07/2014, 10/10/2015, 10/11/2016, 10/11/2017  . Influenza-Unspecified 03/20/2012, 10/01/2013  . Pneumococcal Conjugate-13 10/07/2014  . Pneumococcal Polysaccharide-23 07/27/2009, 10/10/2015  . Tdap 07/16/2006  . Zoster 10/19/2009    Qualifies for Shingles Vaccine? Yes  Zostavax completed 10/19/09. Due for Shingrix. Education has been provided regarding the importance of this vaccine. Pt has been advised to call insurance company to determine out of pocket expense. Advised may also receive vaccine at local pharmacy or Health Dept. Verbalized acceptance and understanding.  Tdap: Although this vaccine is not a covered service during a Wellness Exam, does the patient still wish to receive this vaccine today?  No .  Education has been provided regarding the importance of this vaccine. Advised may receive this vaccine at local pharmacy or Health Dept. Aware  to provide a copy of the vaccination record if  obtained from local pharmacy or Health Dept. Verbalized acceptance and understanding.  Flu Vaccine: Due for Flu vaccine. Does the patient want to receive this vaccine today?  Yes    Screening Tests Health Maintenance  Topic Date Due  . OPHTHALMOLOGY EXAM  05/14/2017  . FOOT EXAM  10/11/2017  . HEMOGLOBIN A1C  10/11/2017  . URINE MICROALBUMIN  10/11/2017  . TETANUS/TDAP  10/12/2018 (Originally 07/15/2016)  . MAMMOGRAM  06/13/2018  . COLONOSCOPY  08/30/2019  . INFLUENZA VACCINE  Completed  . DEXA SCAN  Completed  . Hepatitis C Screening  Completed  . PNA vac Low Risk Adult  Completed   Cancer Screenings:  Colorectal Screening: Completed 08/29/09. Repeat every 10 years  Mammogram: Completed 06/12/17. Repeat every year  Bone Density: Completed 04/23/12. Results reflect NORMAL. Repeat every 2 years. Declined my offer to order today. Education provided re: importance of this screening but still declined.  Lung Cancer Screening: (Low Dose CT Chest recommended if Age 33-80 years, 30 pack-year currently smoking OR have quit w/in 15years.) does not qualify.   Additional Screening:  Hepatitis C Screening: Completed 09/28/11  Dental Screening: Recommended annual dental exams for proper oral hygiene  Community Resource Referral:  CRR required this visit?  No     Plan:  I have personally reviewed and addressed the Medicare Annual Wellness questionnaire and have noted the following in the patient's chart:  A. Medical and social history B. Use of alcohol, tobacco or illicit drugs  C. Current medications and supplements D. Functional ability and status E.  Nutritional status F.  Physical activity G. Advance directives H. List of other physicians I.  Hospitalizations, surgeries, and ER visits in previous 12 months J.  [place] such as hearing and vision if needed, cognitive and depression L. Referrals and appointments  In addition, I have reviewed and discussed with patient  certain preventive protocols, quality metrics, and best practice recommendations. A written personalized care plan for preventive services as well as general preventive health recommendations were provided to patient.  See attached scanned questionnaire for additional information.   Signed,  Aleatha Borer, LPN Nurse Health Advisor

## 2017-10-11 NOTE — Progress Notes (Signed)
Name: Stacy [name]   MRN: [id number]    DOB: 1945/01/18   Date:10/11/2017       Progress Note  Subjective  Chief Complaint  Chief Complaint  Patient presents with  . Medication Refill  . Diabetes  . Hypertension  . Obesity    HPI  DMII: doing well, on diet only, can't take ACE caused angioedema. Based on calculation of ASCVD and bleeding from aspirin, the benefit out weighs the risk and advised her to resume aspirin 81 mg daily. . Denies polyphagia, polydipsia or polyuria. She is following a diabetic diet. hgbA1C is at goal , eye exam scheduled for next mon with  Dr. Matilde Sprang . Last lipid was normal with excellent LDL and HDL values, she is not interested in starting statin therapy, due for repeat urine micro   HTN: taking diuretic, tolerating it well, takes potassium occasionally for leg cramps, last potassium level was normal. . Denies chest pain, palpitation, orthopnea  or SOB, lower extremity swelling is mild. She is due for repeat labs today   Obesity: she has DM and HTN, discussed life style modification. She has lost some weight since last visit  Vitamin D deficiency: continue supplementation    Patient Active Problem List   Diagnosis Date Noted  . Leg edema 12/11/2016  . Abnormal EKG 12/10/2016  . Radial scar of breast 05/30/2015  . History of foot surgery 12/15/2014  . Hypertension, benign 10/07/2014  . Seasonal allergies 10/07/2014  . Diabetes mellitus type 2, diet-controlled ([place]) 10/07/2014  . Morbid obesity ([place]) 10/07/2014  . Vitamin D deficiency 10/07/2014  . Varicose veins 10/07/2014  . Left Achilles tendinitis 10/07/2014  . Family history of breast cancer 05/12/2012  . 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 BREAST BIOPSY Right 2015   3 stereo biopsies. 2 benign. one was radial scar, no excision done for radial scar  . BREAST BIOPSY Left 07/16/2017   BIPHASIC STROMAL AND EPITHELIAL  LESION./ Dr Bary Castilla  . BREAST EXCISIONAL BIOPSY Left 1968   x 3 per pt  . BREAST EXCISIONAL BIOPSY Right 2007   benign  . COLONOSCOPY  2011   DR,ISHAKIS  . FOOT SURGERY  2004  . HEEL SPUR SURGERY Left 12/15/2014   Dr. Earleen Newport at [facility]    Family History  Problem Relation Age of Onset  . Breast cancer Sister 73  . Arthritis Mother   . Stroke Paternal Grandmother   . Cancer Brother 62       kidney  . Cancer Maternal Grandmother        Gallbladder    Social History   Socioeconomic History  . Marital status: Married    Spouse name: Richard  . Number of children: 1  . Years of education: Not on file  . Highest education level: 12th grade  Occupational History  . 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 Financial resource strain: Not hard at all  . Food insecurity:    Worry: Never true    Inability: Never true  . Transportation needs:    Medical: No    Non-medical: No  Tobacco Use  . Smoking status: Never Smoker  . Smokeless tobacco: Never Used  . Tobacco comment: smoking cessation materials not required  Substance and Sexual Activity  . Alcohol use: No    Alcohol/week: 0.0 standard drinks  . Drug use:  No  . Sexual activity: Not Currently    Comment: Husband has prostate issues  Lifestyle  . Physical activity:    Days per week: 0 days    Minutes per session: 0 min  . Stress: Not at all  Relationships  . Social connections:    Talks on phone: Patient refused    Gets together: Patient refused    Attends religious service: Patient refused    Active member of club or organization: Patient refused    Attends meetings of clubs or organizations: Patient refused    Relationship status: Married  . Intimate partner violence:    Fear of current or ex partner: No    Emotionally abused: No    Physically abused: No    Forced sexual activity: No  Other Topics Concern  . Not on file  Social History Narrative  . Not on file      Current Outpatient Medications:  .  Cholecalciferol (VITAMIN D-3 PO), Take 1,000 Units by mouth daily. , Disp: , Rfl:  .  Cinnamon 500 MG capsule, Take 1,000 mg by mouth daily., Disp: , Rfl:  .  FISH OIL-BORAGE-FLAX-SAFFLOWER PO, Take 2 tablets by mouth daily., Disp: , Rfl:  .  fluticasone (FLONASE) 50 MCG/ACT nasal spray, Place 2 sprays into both nostrils daily. (Patient taking differently: Place 2 sprays into both nostrils daily. ), Disp: 16 g, Rfl: 1 .  ketoconazole (NIZORAL) 2 % cream, Apply 1 application topically daily. (Patient taking differently: Apply 1 application topically daily as needed. ), Disp: 60 g, Rfl: 0 .  loratadine (CLARITIN) 10 MG tablet, Take 1 tablet (10 mg total) by mouth daily. (Patient taking differently: Take 10 mg by mouth daily as needed. ), Disp: 90 tablet, Rfl: 1 .  Multiple Vitamins-Minerals (MULTIVITAMIN GUMMIES ADULT PO), Take by mouth daily., Disp: , Rfl:  .  POTASSIUM PO, Take 1 tablet by mouth daily. , Disp: , Rfl:  .  triamterene-hydrochlorothiazide (MAXZIDE-25) 37.5-25 MG tablet, Take 1 tablet by mouth daily., Disp: 90 tablet, Rfl: 1  Allergies  Allergen Reactions  . Altace [Ramipril] Swelling    I personally reviewed active problem list, medication list, allergies, family history, social history with the patient/caregiver today.   ROS  Constitutional: Negative for fever or significant weight change.  Respiratory: Negative for cough and shortness of breath.   Cardiovascular: Negative for chest pain or palpitations.  Gastrointestinal: Negative for abdominal pain, no bowel changes.  Musculoskeletal: Negative for gait problem or joint swelling.  Skin: Negative for rash.  Neurological: Negative for dizziness or headache.  No other specific complaints in a complete review of systems (except as listed in HPI above).  Objective  Vitals:   10/11/17 1007  BP: 112/68  Pulse: 72  Resp: 14  Temp: 98.1 F (36.7 C)  TempSrc: Oral  SpO2: 94%   Weight: 193 lb 14.4 oz (88 kg)  Height: 5\' 7"  (1.702 m)    Body mass index is 30.37 kg/m.  Physical Exam  Constitutional: Patient appears well-developed and well-nourished. Obese  No distress.  HEENT: head atraumatic, normocephalic, pupils equal and reactive to light, neck supple, throat within normal limits Cardiovascular: Normal rate, regular rhythm and normal heart sounds.  No murmur heard. Trace  BLE edema. Pulmonary/Chest: Effort normal and breath sounds normal. No respiratory distress. Abdominal: Soft.  There is no tenderness. Psychiatric: Patient has a normal mood and affect. behavior is normal. Judgment and thought content normal.  Diabetic Foot Exam: Diabetic Foot Exam - Simple  Simple Foot Form Diabetic Foot exam was performed with the following findings:  Yes 10/11/2017 10:41 AM  Visual Inspection See comments:  Yes Sensation Testing Intact to touch and monofilament testing bilaterally:  Yes Pulse Check Posterior Tibialis and Dorsalis pulse intact bilaterally:  Yes Comments Scar tissue and also varicose veins      PHQ2/9: Depression screen [facility] 2/9 10/11/2017 10/11/2016 10/08/2016 04/09/2016 10/10/2015  Decreased Interest 0 0 0 0 0  Down, Depressed, Hopeless 0 0 0 0 0  PHQ - 2 Score 0 0 0 0 0  Altered sleeping 0 - - - -  Tired, decreased energy 0 - - - -  Change in appetite 0 - - - -  Feeling bad or failure about yourself  0 - - - -  Trouble concentrating 0 - - - -  Moving slowly or fidgety/restless 0 - - - -  Suicidal thoughts 0 - - - -  PHQ-9 Score 0 - - - -  Difficult doing work/chores Not difficult at all - - - -     Fall Risk: Fall Risk  10/11/2017 04/10/2017 10/11/2016 10/08/2016 04/09/2016  Falls in the past year? No No No No No  Risk for fall due to : Impaired vision - - - -  Risk for fall due to: Comment wears eyeglasses - - - -     Assessment & Plan  1. Diabetes mellitus type 2 in obese (HCC)  - Hemoglobin A1c - Urine Microalbumin w/creat.  ratio  2. Hypertension, benign  - COMPLETE METABOLIC PANEL WITH GFR - CBC with Differential/Platelet - triamterene-hydrochlorothiazide (MAXZIDE-25) 37.5-25 MG tablet; Take 1 tablet by mouth daily.  Dispense: 90 tablet; Refill: 1  3. Class 1 drug-induced obesity with serious comorbidity and body mass index (BMI) of 30.0 to 30.9 in adult  Discussed with the patient the risk posed by an increased BMI. Discussed importance of portion control, calorie counting and at least 150 minutes of physical activity weekly. Avoid sweet beverages and drink more water. Eat at least 6 servings of fruit and vegetables daily   4. Vitamin D deficiency  Taking vitamin D otc  5. Leg edema  On diuretic , doing well

## 2017-10-12 LAB — CBC WITH DIFFERENTIAL/PLATELET
BASOS ABS: 32 {cells}/uL (ref 0–200)
Basophils Relative: 1 %
EOS PCT: 4.4 %
Eosinophils Absolute: 141 cells/uL (ref 15–500)
HCT: 36.1 % (ref 35.0–45.0)
HEMOGLOBIN: 12.1 g/dL (ref 11.7–15.5)
LYMPHS ABS: 1322 {cells}/uL (ref 850–3900)
MCH: 28.1 pg (ref 27.0–33.0)
MCHC: 33.5 g/dL (ref 32.0–36.0)
MCV: 83.8 fL (ref 80.0–100.0)
MPV: 10.2 fL (ref 7.5–12.5)
Monocytes Relative: 9.8 %
NEUTROS ABS: 1392 {cells}/uL — AB (ref 1500–7800)
Neutrophils Relative %: 43.5 %
Platelets: 253 10*3/uL (ref 140–400)
RBC: 4.31 10*6/uL (ref 3.80–5.10)
RDW: 13 % (ref 11.0–15.0)
Total Lymphocyte: 41.3 %
WBC: 3.2 10*3/uL — ABNORMAL LOW (ref 3.8–10.8)
WBCMIX: 314 {cells}/uL (ref 200–950)

## 2017-10-12 LAB — COMPLETE METABOLIC PANEL WITH GFR
AG Ratio: 1.3 (calc) (ref 1.0–2.5)
ALT: 14 U/L (ref 6–29)
AST: 17 U/L (ref 10–35)
Albumin: 3.9 g/dL (ref 3.6–5.1)
Alkaline phosphatase (APISO): 45 U/L (ref 33–130)
BILIRUBIN TOTAL: 0.6 mg/dL (ref 0.2–1.2)
BUN: 15 mg/dL (ref 7–25)
CALCIUM: 9.3 mg/dL (ref 8.6–10.4)
CHLORIDE: 102 mmol/L (ref 98–110)
CO2: 32 mmol/L (ref 20–32)
CREATININE: 0.83 mg/dL (ref 0.60–0.93)
GFR, EST AFRICAN AMERICAN: 83 mL/min/{1.73_m2} (ref >=60)
GFR, Est Non African American: 71 mL/min/{1.73_m2} (ref >=60)
GLUCOSE: 88 mg/dL (ref 65–99)
Globulin: 2.9 g/dL (calc) (ref 1.9–3.7)
Potassium: 3.8 mmol/L (ref 3.5–5.3)
Sodium: 139 mmol/L (ref 135–146)
TOTAL PROTEIN: 6.8 g/dL (ref 6.1–8.1)

## 2017-10-12 LAB — HEMOGLOBIN A1C
HEMOGLOBIN A1C: 6.3 %{Hb} — AB (ref <5.7)
Mean Plasma Glucose: 134 (calc)
eAG (mmol/L): 7.4 (calc)

## 2017-10-12 LAB — MICROALBUMIN / CREATININE URINE RATIO
Creatinine, Urine: 61 mg/dL (ref 20–275)
MICROALB UR: 0.2 mg/dL
Microalb Creat Ratio: 3 mcg/mg creat (ref <30)

## 2017-11-12 DIAGNOSIS — H2513 Age-related nuclear cataract, bilateral: Secondary | ICD-10-CM | POA: Diagnosis not present

## 2017-11-12 DIAGNOSIS — H25013 Cortical age-related cataract, bilateral: Secondary | ICD-10-CM | POA: Diagnosis not present

## 2017-11-12 DIAGNOSIS — Z7984 Long term (current) use of oral hypoglycemic drugs: Secondary | ICD-10-CM | POA: Diagnosis not present

## 2017-11-12 DIAGNOSIS — E119 Type 2 diabetes mellitus without complications: Secondary | ICD-10-CM | POA: Diagnosis not present

## 2017-11-12 LAB — HM DIABETES EYE EXAM

## 2017-11-13 ENCOUNTER — Encounter: Payer: Self-pay | Admitting: Family Medicine

## 2018-04-10 ENCOUNTER — Ambulatory Visit (INDEPENDENT_AMBULATORY_CARE_PROVIDER_SITE_OTHER): Payer: PPO | Admitting: Family Medicine

## 2018-04-10 ENCOUNTER — Encounter: Payer: Self-pay | Admitting: Family Medicine

## 2018-04-10 ENCOUNTER — Other Ambulatory Visit: Payer: Self-pay

## 2018-04-10 DIAGNOSIS — E1169 Type 2 diabetes mellitus with other specified complication: Secondary | ICD-10-CM | POA: Diagnosis not present

## 2018-04-10 DIAGNOSIS — I10 Essential (primary) hypertension: Secondary | ICD-10-CM

## 2018-04-10 DIAGNOSIS — E669 Obesity, unspecified: Secondary | ICD-10-CM

## 2018-04-10 MED ORDER — TRIAMTERENE-HCTZ 37.5-25 MG PO TABS: 1.0000 | ORAL_TABLET | Freq: Every day | ORAL | 0 refills | Status: DC

## 2018-04-10 NOTE — Progress Notes (Signed)
Virtual Visit via Telephone Note  I connected with Stacy [name] on 04/10/18 at  8:40 AM EDT by telephone and verified that I am speaking with the correct person using two identifiers.   I discussed the limitations, risks, security and privacy concerns of performing an evaluation and management service by telephone and the availability of in person appointments. I also discussed with the patient that there may be a patient responsible charge related to this service. The patient expressed understanding and agreed to proceed.  DMII: she is on diet only, denies polyphagia, polydipsia or polyuria She will return for labs in a few months   Obesity: she has DM and HTN, discussed life style modification. She states weight at home has been around 190 lbs. She has been cooking at home  Patient is at home I am at [facility]   History of Present Illness:  HTN: she has been taking medication as prescribed, no chest pain or palpitation, she states occasionally has some lower extremity edema. She has good exercise tolerance  DMII: she is on diet only, denies polyphagia, polydipsia or polyuria She will return for labs in a few months   Obesity: she has DM and HTN, discussed life style modification. She states weight at home has been around 190 lbs. She has been cooking at home   Observations/Objective:  Normal speech pattern, normal judgement   Assessment and Plan:  1. Hypertension, benign  - triamterene-hydrochlorothiazide (MAXZIDE-25) 37.5-25 MG tablet; Take 1 tablet by mouth daily.  Dispense: 90 tablet; Refill: 0  2. Diabetes mellitus type 2 in obese ([place])  On life style modification   3. Obesity (BMI 30.0-34.9)  Discussed with the patient the risk posed by an increased BMI. Discussed importance of portion control, calorie counting and at least 150 minutes of physical activity weekly. Avoid sweet beverages and drink more water. Eat at least 6 servings of fruit and vegetables daily    Follow Up Instructions:  I discussed the assessment and treatment plan with the patient. The patient was provided an opportunity to ask questions and all were answered. The patient agreed with the plan and demonstrated an understanding of the instructions.   The patient was advised to call back or seek an in-person evaluation if the symptoms worsen or if the condition fails to improve as anticipated.  I provided 22  minutes of non-face-to-face time during this encounter.   Vonna Kotyk, CMA

## 2018-04-11 ENCOUNTER — Other Ambulatory Visit: Payer: Self-pay | Admitting: Family Medicine

## 2018-04-11 DIAGNOSIS — J302 Other seasonal allergic rhinitis: Secondary | ICD-10-CM

## 2018-04-11 MED ORDER — LORATADINE 10 MG PO TABS: 10.0000 mg | ORAL_TABLET | Freq: Every day | ORAL | 1 refills | Status: DC

## 2018-04-11 NOTE — Telephone Encounter (Signed)
Copied from [place] [id number]. Topic: Quick Communication - Rx Refill/Question >> Apr 11, 2018 11:50 AM Burchel, Abbi R wrote: Medication: loratadine (CLARITIN) 10 MG tablet  Preferred Pharmacy:  [facility] (N), [place] - [place]  [phone] (Phone) [phone] (Fax)  Pt was advised that RX refills may take up to 3 business days. We ask that you follow-up with your pharmacy.

## 2018-05-03 IMAGING — MG MM DIGITAL DIAGNOSTIC BILAT W/ TOMO W/ CAD
8 of 17 series · 8 of 40 positions shown · non-contrast
Comparison: Previous exam(s).

CLINICAL DATA: 68-year-old female with history of 3 stereotactic
guided biopsies of the right breast in Wednesday May, 2013 demonstrating
columnar change with microcalcifications. Biopsy labeled part C,
right breast posterior also demonstrated a radial scar. The patient
has a history of additional prior benign bilateral excisional
biopsies.

EXAM:
2D DIGITAL DIAGNOSTIC BILATERAL MAMMOGRAM WITH CAD AND ADJUNCT TOMO

[L CC]
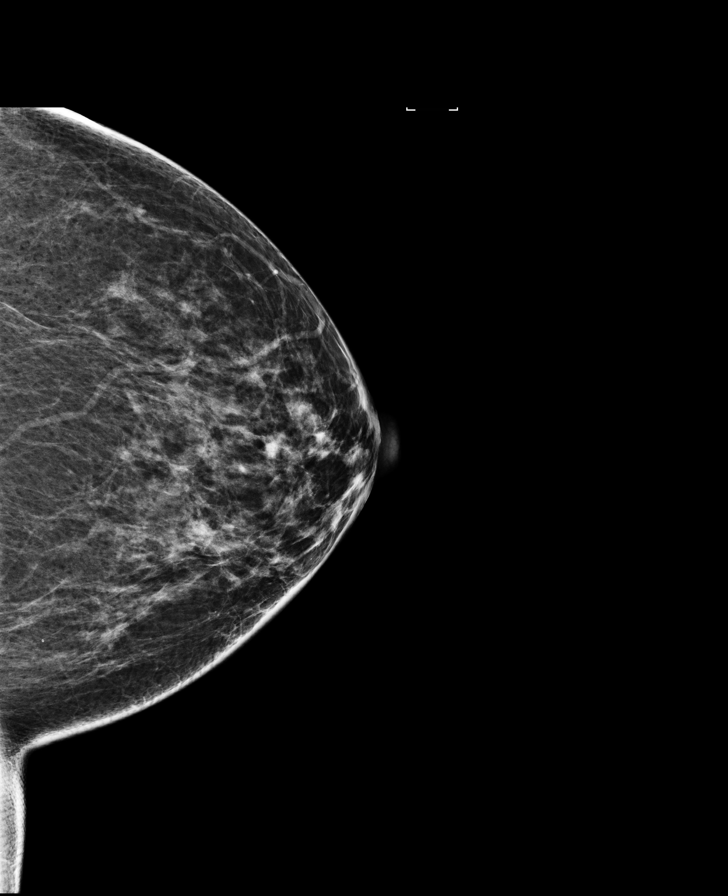

[L CC synth-2D]
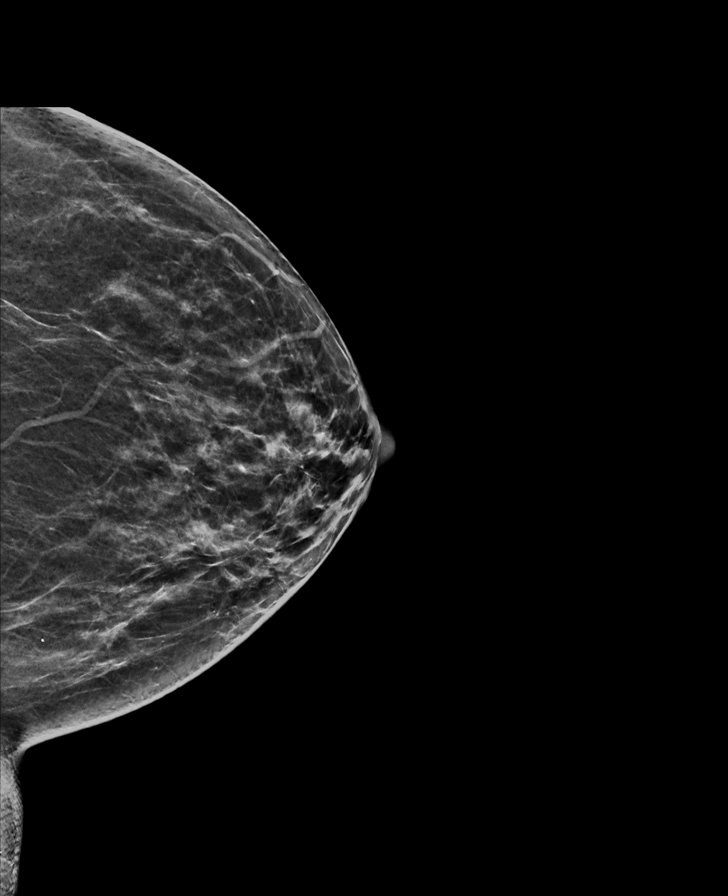

[L MLO (1 of 2)]
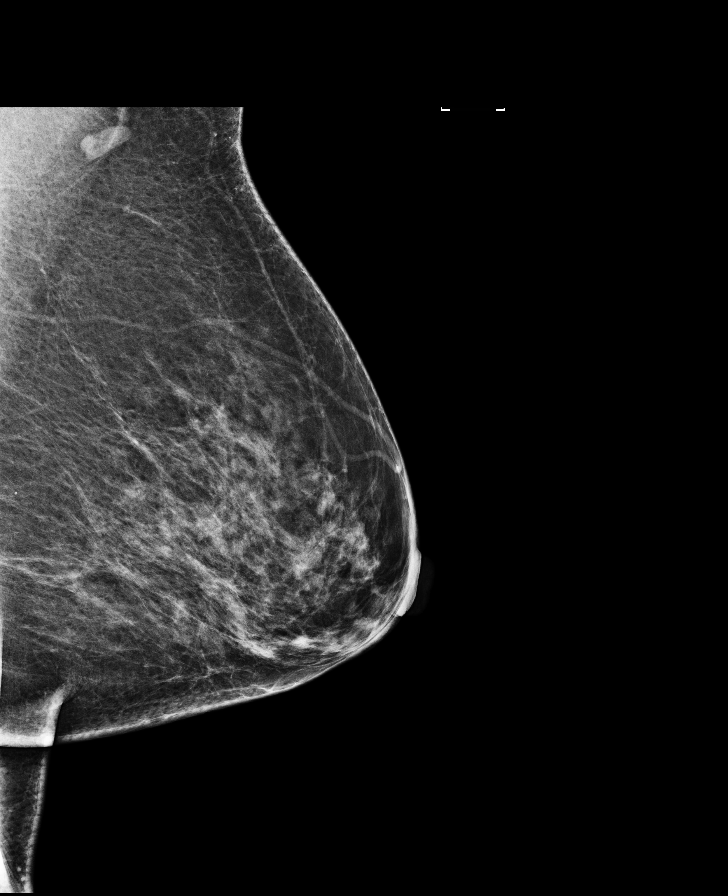

[L MLO synth-2D]
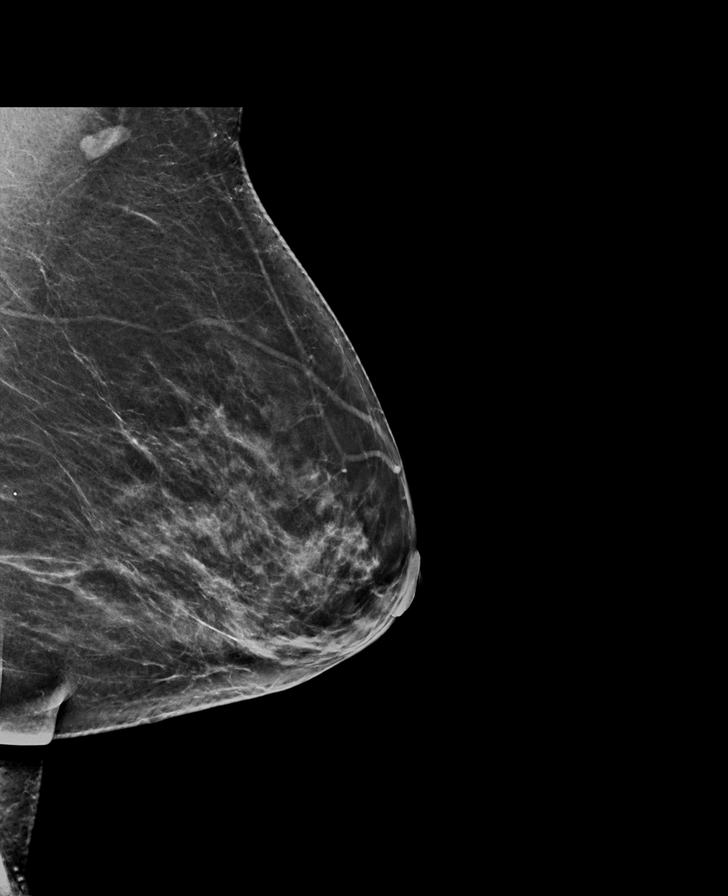

[R CC]
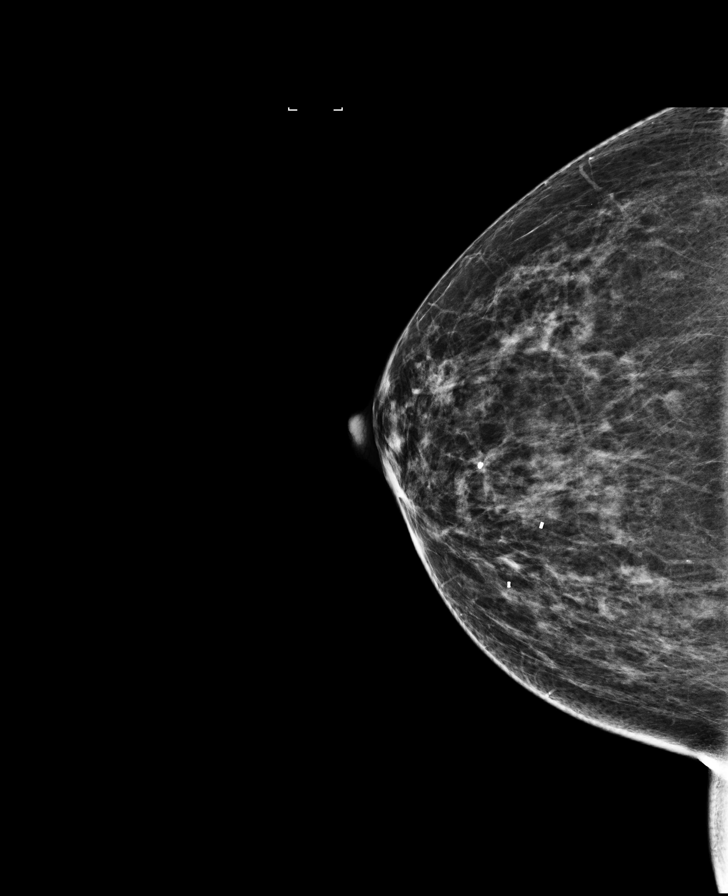

[L MLO (2 of 2)]
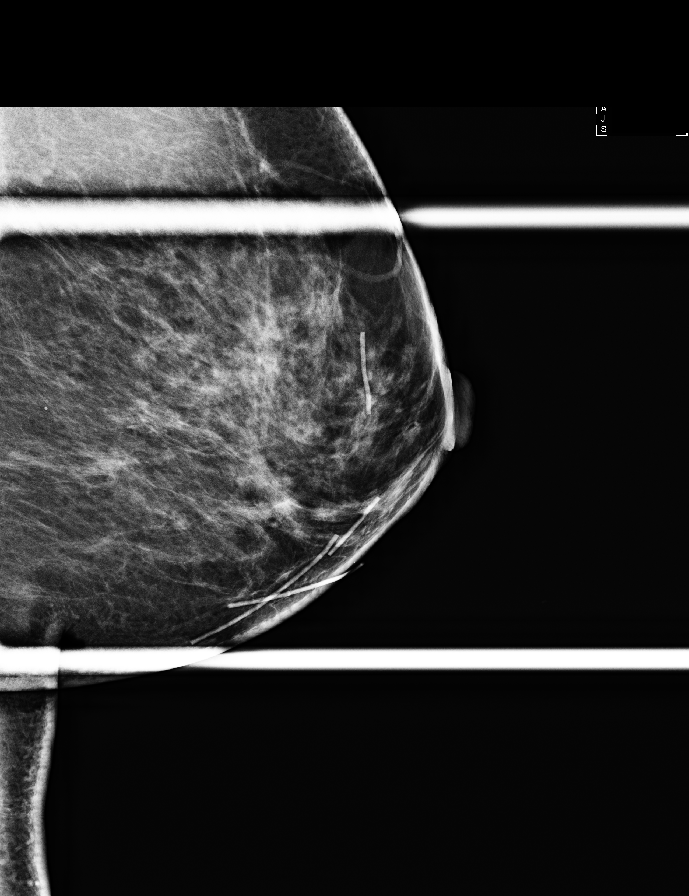

[R MLO]
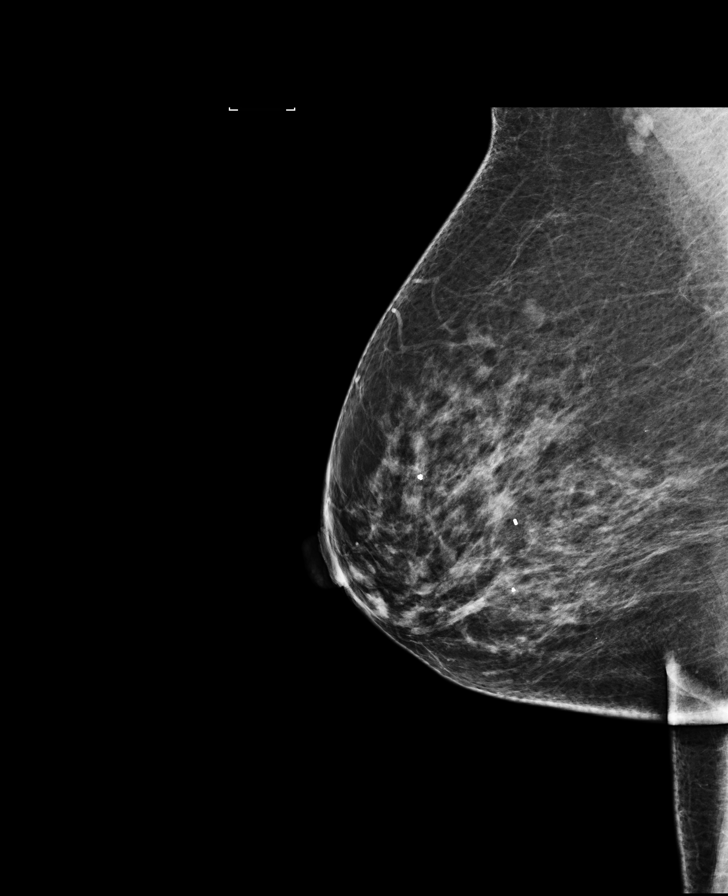

[R CC synth-2D]
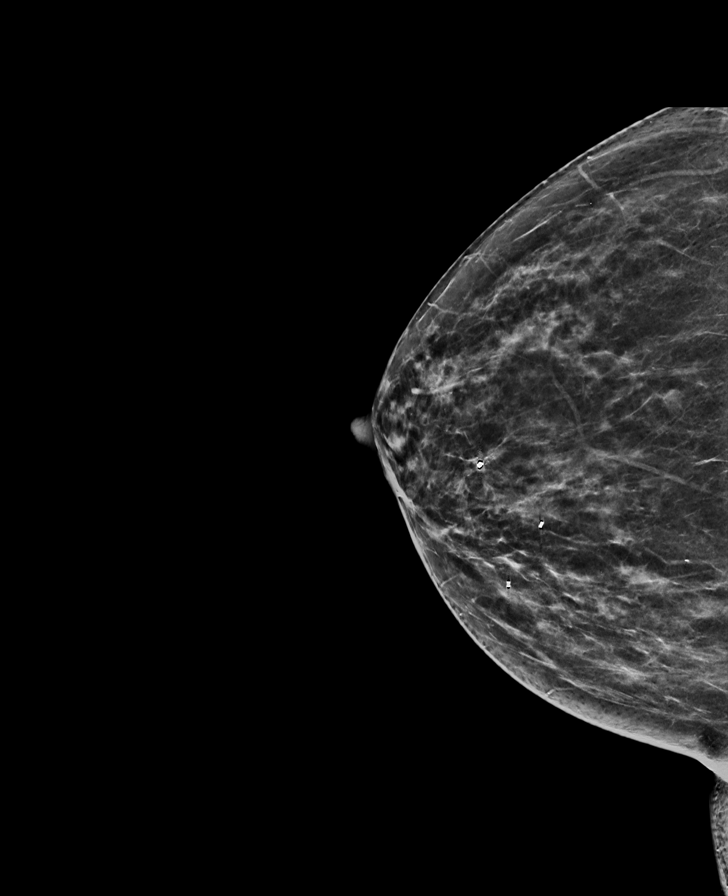

[8 of 40 positions shown; findings below may reference images not displayed]

ACR Breast Density Category c: The breast tissue is heterogeneously
dense, which may obscure small masses.
FINDINGS: No suspicious mass, calcifications, or other abnormality is
identified within either breast. Three biopsy marker clips are again
noted within the slightly inner right breast marking the sites of
prior benign biopsies.

Mammographic images were processed with CAD.
IMPRESSION: No mammographic evidence of malignancy.

RECOMMENDATION:
1. Consider surgical excision of biopsy proven radial scar of the
right breast. The patient states that she has an upcoming follow-up
appoint with Dr. Kok.
2. Bilateral diagnostic mammogram in 1 year.

I have discussed the findings and recommendations with the patient.
Results were also provided in writing at the conclusion of the
visit. If applicable, a reminder letter will be sent to the patient
regarding the next appointment.

BI-RADS CATEGORY  2: Benign.

## 2018-06-26 DIAGNOSIS — E119 Type 2 diabetes mellitus without complications: Secondary | ICD-10-CM | POA: Diagnosis not present

## 2018-06-26 DIAGNOSIS — H25013 Cortical age-related cataract, bilateral: Secondary | ICD-10-CM | POA: Diagnosis not present

## 2018-06-26 DIAGNOSIS — Z7984 Long term (current) use of oral hypoglycemic drugs: Secondary | ICD-10-CM | POA: Diagnosis not present

## 2018-06-26 DIAGNOSIS — H2513 Age-related nuclear cataract, bilateral: Secondary | ICD-10-CM | POA: Diagnosis not present

## 2018-06-26 DIAGNOSIS — H1045 Other chronic allergic conjunctivitis: Secondary | ICD-10-CM | POA: Diagnosis not present

## 2018-07-16 ENCOUNTER — Other Ambulatory Visit: Payer: Self-pay | Admitting: Family Medicine

## 2018-07-16 DIAGNOSIS — I10 Essential (primary) hypertension: Secondary | ICD-10-CM

## 2018-07-16 NOTE — Telephone Encounter (Signed)
Hypertension medication request: triam-hctz to [facility]   Last office visit pertaining to hypertension: 04/10/2018   BP Readings from Last 3 Encounters:  10/11/17 112/68  10/11/17 112/68  08/22/17 108/60    Lab Results  Component Value Date   CREATININE 0.83 10/11/2017   BUN 15 10/11/2017   NA 139 10/11/2017   K 3.8 10/11/2017   CL 102 10/11/2017   CO2 32 10/11/2017     Follow up on 07/22/2018

## 2018-07-22 ENCOUNTER — Other Ambulatory Visit: Payer: Self-pay | Admitting: *Deleted

## 2018-07-22 ENCOUNTER — Ambulatory Visit (INDEPENDENT_AMBULATORY_CARE_PROVIDER_SITE_OTHER): Payer: PPO | Admitting: Family Medicine

## 2018-07-22 ENCOUNTER — Other Ambulatory Visit: Payer: Self-pay

## 2018-07-22 ENCOUNTER — Encounter: Payer: Self-pay | Admitting: Family Medicine

## 2018-07-22 VITALS — BP 108/70 | HR 81 | Temp 97.3°F | Resp 16 | Ht 67.0 in | Wt 193.6 lb

## 2018-07-22 DIAGNOSIS — R600 Localized edema: Secondary | ICD-10-CM

## 2018-07-22 DIAGNOSIS — D72819 Decreased white blood cell count, unspecified: Secondary | ICD-10-CM | POA: Diagnosis not present

## 2018-07-22 DIAGNOSIS — D242 Benign neoplasm of left breast: Secondary | ICD-10-CM

## 2018-07-22 DIAGNOSIS — E559 Vitamin D deficiency, unspecified: Secondary | ICD-10-CM | POA: Diagnosis not present

## 2018-07-22 DIAGNOSIS — E669 Obesity, unspecified: Secondary | ICD-10-CM

## 2018-07-22 DIAGNOSIS — I10 Essential (primary) hypertension: Secondary | ICD-10-CM

## 2018-07-22 DIAGNOSIS — Z803 Family history of malignant neoplasm of breast: Secondary | ICD-10-CM

## 2018-07-22 DIAGNOSIS — E1169 Type 2 diabetes mellitus with other specified complication: Secondary | ICD-10-CM | POA: Diagnosis not present

## 2018-07-22 DIAGNOSIS — J302 Other seasonal allergic rhinitis: Secondary | ICD-10-CM

## 2018-07-22 LAB — POCT GLYCOSYLATED HEMOGLOBIN (HGB A1C): Hemoglobin A1C: 6.1 % — AB (ref 4.0–5.6)

## 2018-07-22 NOTE — Progress Notes (Signed)
Name: Stacy [name]   MRN: [id number]    DOB: 1945-10-14   Date:07/22/2018       Progress Note  Subjective  Chief Complaint  Chief Complaint  Patient presents with  . Diabetes  . Hypertension  . Obesity    HPI   DMII: doing well, on diet only, can't take ACE caused angioedema. . Denies polyphagia, polydipsia or polyuria. She is following a diabetic diet. hgbA1C is at goal , eye exam is up to date . Last lipid was normal with excellent LDL and HDL values, she is not interested in starting statin therapy, she will try adding aspirin 81 mg daily   HTN: taking diuretic, tolerating it well, takes potassium occasionally for leg cramps, last potassium level was normal.. Denies chest pain, palpitation, orthopneaor SOB, lower extremity swelling is mild. We went down on dose last visit but bp is still low, she will try taking half pill for one week and return for bp check only with CMA next week   Obesity: she has DM and HTN, discussed life style modification. weight is stable   Vitamin D deficiency: continue supplementation , recheck labs today   Patient Active Problem List   Diagnosis Date Noted  . Leg edema 12/11/2016  . Abnormal EKG 12/10/2016  . Radial scar of breast 05/30/2015  . History of foot surgery 12/15/2014  . Hypertension, benign 10/07/2014  . Seasonal allergies 10/07/2014  . Diabetes mellitus type 2, diet-controlled ([place]) 10/07/2014  . Morbid obesity ([place]) 10/07/2014  . Vitamin D deficiency 10/07/2014  . Varicose veins 10/07/2014  . Left Achilles tendinitis 10/07/2014  . Family history of breast cancer 05/12/2012  . 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 BREAST BIOPSY Right 2015   3 stereo biopsies. 2 benign. one was radial scar, no excision done for radial scar  . BREAST BIOPSY Left 07/16/2017   BIPHASIC STROMAL AND EPITHELIAL LESION./ Dr Bary Castilla  . BREAST EXCISIONAL BIOPSY Left 1968   x 3 per pt  . BREAST EXCISIONAL BIOPSY Right 2007   benign  . COLONOSCOPY  2011   DR,ISHAKIS  . FOOT SURGERY  2004  . HEEL SPUR SURGERY Left 12/15/2014   Dr[facility] Earleen Newport at [facility]    Family History  Problem Relation Age of Onset  . Breast cancer Sister 83  . Arthritis Mother   . Stroke Paternal Grandmother   . Cancer Brother 61       kidney  . Cancer Maternal Grandmother        Gallbladder    Social History   Socioeconomic History  . Marital status: Married    Spouse name: Richard  . Number of children: 1  . Years of education: Not on file  . Highest education level: 12th grade  Occupational History  . 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 Financial resource strain: Not hard at all  . Food insecurity    Worry: Never true    Inability: Never true  . Transportation needs    Medical: No    Non-medical: No  Tobacco Use  . Smoking status: Never Smoker  . Smokeless tobacco: Never Used  . Tobacco comment: smoking cessation materials not required  Substance and Sexual Activity  . Alcohol use: No    Alcohol/week: 0.0 standard drinks  . Drug use: No  . Sexual activity: Not Currently  Comment: Husband has prostate issues  Lifestyle  . Physical activity    Days per week: 0 days    Minutes per session: 0 min  . Stress: Not at all  Relationships  . Social Herbalist on phone: Patient refused    Gets together: Patient refused    Attends religious service: Patient refused    Active member of club or organization: Patient refused    Attends meetings of clubs or organizations: Patient refused    Relationship status: Married  . Intimate partner violence    Fear of current or ex partner: No    Emotionally abused: No    Physically abused: No    Forced sexual activity: No  Other Topics Concern  . Not on file  Social History Narrative  . Not on file     Current Outpatient Medications:  .  Cholecalciferol (VITAMIN D-3 PO),  Take 1,000 Units by mouth daily. , Disp: , Rfl:  .  Cinnamon 500 MG capsule, Take 1,000 mg by mouth daily., Disp: , Rfl:  .  FISH OIL-BORAGE-FLAX-SAFFLOWER PO, Take 2 tablets by mouth daily., Disp: , Rfl:  .  fluticasone (FLONASE) 50 MCG/ACT nasal spray, Place 2 sprays into both nostrils daily. (Patient taking differently: Place 2 sprays into both nostrils daily. ), Disp: 16 g, Rfl: 1 .  ketoconazole (NIZORAL) 2 % cream, Apply 1 application topically daily. (Patient taking differently: Apply 1 application topically daily as needed. ), Disp: 60 g, Rfl: 0 .  loratadine (CLARITIN) 10 MG tablet, Take 1 tablet (10 mg total) by mouth daily., Disp: 90 tablet, Rfl: 1 .  Multiple Vitamins-Minerals (MULTIVITAMIN GUMMIES ADULT PO), Take by mouth daily., Disp: , Rfl:  .  POTASSIUM PO, Take 1 tablet by mouth daily. , Disp: , Rfl:  .  triamterene-hydrochlorothiazide (MAXZIDE-25) 37.5-25 MG tablet, Take 1 tablet by mouth once daily, Disp: 90 tablet, Rfl: 0  Allergies  Allergen Reactions  . Altace [Ramipril] Swelling    I personally reviewed active problem list, medication list, allergies, family history, social history with the patient/caregiver today.   ROS  Constitutional: Negative for fever or weight change.  Respiratory: Negative for cough and shortness of breath.   Cardiovascular: Negative for chest pain or palpitations.  Gastrointestinal: Negative for abdominal pain, no bowel changes.  Musculoskeletal: Negative for gait problem or joint swelling.  Skin: Negative for rash.  Neurological: Negative for dizziness or headache.  No other specific complaints in a complete review of systems (except as listed in HPI above).  Objective  Vitals:   07/22/18 1051  BP: 108/70  Pulse: 81  Resp: 16  Temp: (!) 97.3 F (36.3 C)  TempSrc: Oral  SpO2: 97%  Weight: 193 lb 9.6 oz (87.8 kg)  Height: 5\' 7"  (1.702 m)    Body mass index is 30.32 kg/m.  Physical Exam  Constitutional: Patient appears  well-developed and well-nourished. Obese  No distress.  HEENT: head atraumatic, normocephalic, pupils equal and reactive to light,  neck supple Cardiovascular: Normal rate, regular rhythm and normal heart sounds.  No murmur heard. Trace BLE edema. Pulmonary/Chest: Effort normal and breath sounds normal. No respiratory distress. Abdominal: Soft.  There is no tenderness. Psychiatric: Patient has a normal mood and affect. behavior is normal. Judgment and thought content normal.  PHQ2/9: Depression screen [facility] 2/9 07/22/2018 04/10/2018 10/11/2017 10/11/2016 10/08/2016  Decreased Interest 0 0 0 0 0  Down, Depressed, Hopeless 0 0 0 0 0  PHQ - 2 Score 0  0 0 0 0  Altered sleeping 0 0 0 - -  Tired, decreased energy 0 0 0 - -  Change in appetite 0 0 0 - -  Feeling bad or failure about yourself  0 0 0 - -  Trouble concentrating 0 0 0 - -  Moving slowly or fidgety/restless 0 0 0 - -  Suicidal thoughts 0 0 0 - -  PHQ-9 Score 0 0 0 - -  Difficult doing work/chores - Not difficult at all Not difficult at all - -    phq 9 is negative   Fall Risk: Fall Risk  07/22/2018 04/10/2018 10/11/2017 04/10/2017 10/11/2016  Falls in the past year? 0 0 No No No  Number falls in past yr: 0 0 - - -  Injury with Fall? 0 0 - - -  Risk for fall due to : - - Impaired vision - -  Risk for fall due to: Comment - - wears eyeglasses - -     Functional Status Survey: Is the patient deaf or have difficulty hearing?: No Does the patient have difficulty seeing, even when wearing glasses/contacts?: No Does the patient have difficulty concentrating, remembering, or making decisions?: No Does the patient have difficulty walking or climbing stairs?: No Does the patient have difficulty dressing or bathing?: No Does the patient have difficulty doing errands alone such as visiting a doctor's office or shopping?: No    Assessment & Plan  1. Diabetes mellitus type 2 in obese (HCC)  - POCT HgB A1C - Lipid panel  2. Obesity (BMI  30.0-34.9)  Discussed with the patient the risk posed by an increased BMI. Discussed importance of portion control, calorie counting and at least 150 minutes of physical activity weekly. Avoid sweet beverages and drink more water. Eat at least 6 servings of fruit and vegetables daily   3. Hypertension, benign  Advised to try taking half of pill and monitor  - Lipid panel - COMPLETE METABOLIC PANEL WITH GFR  4. Leg edema  Continue compression stocking hoses   5. Seasonal allergies   6. Vitamin D deficiency  - VITAMIN D 25 Hydroxy (Vit-D Deficiency, Fractures)  7. Leukopenia, unspecified type  - CBC with Differential/Platelet

## 2018-07-23 LAB — COMPLETE METABOLIC PANEL WITH GFR
AG Ratio: 1.4 (calc) (ref 1.0–2.5)
ALT: 18 U/L (ref 6–29)
AST: 17 U/L (ref 10–35)
Albumin: 4 g/dL (ref 3.6–5.1)
Alkaline phosphatase (APISO): 48 U/L (ref 37–153)
BUN: 17 mg/dL (ref 7–25)
CO2: 32 mmol/L (ref 20–32)
Calcium: 9.6 mg/dL (ref 8.6–10.4)
Chloride: 103 mmol/L (ref 98–110)
Creat: 0.77 mg/dL (ref 0.60–0.93)
GFR, Est African American: 90 mL/min/{1.73_m2} (ref >=60)
GFR, Est Non African American: 78 mL/min/{1.73_m2} (ref >=60)
Globulin: 2.9 g/dL (calc) (ref 1.9–3.7)
Glucose, Bld: 100 mg/dL — ABNORMAL HIGH (ref 65–99)
Potassium: 3.8 mmol/L (ref 3.5–5.3)
Sodium: 140 mmol/L (ref 135–146)
Total Bilirubin: 0.5 mg/dL (ref 0.2–1.2)
Total Protein: 6.9 g/dL (ref 6.1–8.1)

## 2018-07-23 LAB — CBC WITH DIFFERENTIAL/PLATELET
Absolute Monocytes: 320 cells/uL (ref 200–950)
Basophils Absolute: 41 cells/uL (ref 0–200)
Basophils Relative: 1 %
Eosinophils Absolute: 180 cells/uL (ref 15–500)
Eosinophils Relative: 4.4 %
HCT: 36.5 % (ref 35.0–45.0)
Hemoglobin: 12.2 g/dL (ref 11.7–15.5)
Lymphs Abs: 1435 cells/uL (ref 850–3900)
MCH: 28.7 pg (ref 27.0–33.0)
MCHC: 33.4 g/dL (ref 32.0–36.0)
MCV: 85.9 fL (ref 80.0–100.0)
MPV: 10.5 fL (ref 7.5–12.5)
Monocytes Relative: 7.8 %
Neutro Abs: 2124 cells/uL (ref 1500–7800)
Neutrophils Relative %: 51.8 %
Platelets: 258 10*3/uL (ref 140–400)
RBC: 4.25 10*6/uL (ref 3.80–5.10)
RDW: 13.1 % (ref 11.0–15.0)
Total Lymphocyte: 35 %
WBC: 4.1 10*3/uL (ref 3.8–10.8)

## 2018-07-23 LAB — VITAMIN D 25 HYDROXY (VIT D DEFICIENCY, FRACTURES): Vit D, 25-Hydroxy: 40 ng/mL (ref 30–100)

## 2018-07-23 LAB — LIPID PANEL
Cholesterol: 162 mg/dL (ref <200)
HDL: 70 mg/dL (ref >=50)
LDL Cholesterol (Calc): 78 mg/dL (calc)
Non-HDL Cholesterol (Calc): 92 mg/dL (calc) (ref <130)
Total CHOL/HDL Ratio: 2.3 (calc) (ref <5.0)
Triglycerides: 62 mg/dL (ref <150)

## 2018-07-29 ENCOUNTER — Other Ambulatory Visit: Payer: Self-pay

## 2018-07-29 ENCOUNTER — Ambulatory Visit: Payer: PPO

## 2018-07-29 DIAGNOSIS — I10 Essential (primary) hypertension: Secondary | ICD-10-CM

## 2018-07-29 NOTE — Progress Notes (Signed)
Patients bp today was 122/86 and pulse 72.  She decreased her bp in hlf due to low bp at last visit.  She denies any side effects.  Pt was told bp good, but would send to Dr. Ancil Boozer and if anything needed changing we would call.

## 2018-07-30 ENCOUNTER — Ambulatory Visit
Admission: RE | Admit: 2018-07-30 | Discharge: 2018-07-30 | Disposition: A | Discharge status: Home / Self Care | Payer: PPO | Source: Ambulatory Visit | Attending: General Surgery | Admitting: General Surgery

## 2018-07-30 DIAGNOSIS — Z803 Family history of malignant neoplasm of breast: Secondary | ICD-10-CM

## 2018-07-30 DIAGNOSIS — L905 Scar conditions and fibrosis of skin: Secondary | ICD-10-CM | POA: Diagnosis not present

## 2018-07-30 DIAGNOSIS — D242 Benign neoplasm of left breast: Secondary | ICD-10-CM

## 2018-07-30 DIAGNOSIS — N6323 Unspecified lump in the left breast, lower outer quadrant: Secondary | ICD-10-CM | POA: Diagnosis not present

## 2018-08-11 ENCOUNTER — Ambulatory Visit (INDEPENDENT_AMBULATORY_CARE_PROVIDER_SITE_OTHER): Payer: PPO | Admitting: Physician Assistant

## 2018-08-11 ENCOUNTER — Encounter: Payer: Self-pay | Admitting: Physician Assistant

## 2018-08-11 ENCOUNTER — Other Ambulatory Visit: Payer: Self-pay

## 2018-08-11 VITALS — BP 157/77 | HR 90 | Temp 97.2°F | Ht 67.0 in | Wt 202.0 lb

## 2018-08-11 DIAGNOSIS — Z1231 Encounter for screening mammogram for malignant neoplasm of breast: Secondary | ICD-10-CM

## 2018-08-11 DIAGNOSIS — N6489 Other specified disorders of breast: Secondary | ICD-10-CM

## 2018-08-11 NOTE — Progress Notes (Signed)
[facility] NOTE  08/11/2018  History of Present Illness: Stacy [name] is a 73 y.o. female who has followed with Dr Bary Castilla for annual mammogram screening. She underwent yearly mammogram and [place] on 07/15 which were negative for malignancy and showed reduction in size of benign mass at 5 o'clock position in left breast. She denied any new palpable breast masses or changes, no nipple discharge, no overlying skin changes, no nipple changes. She continues to preform self breast examinations. No additional new complaints.     Past Medical History: Past Medical History:  Diagnosis Date  . Hypertension   . Neoplasm of uncertain behavior of breast 2007     Past Surgical History: Past Surgical History:  Procedure Laterality Date  . BREAST BIOPSY Right 2015   3 stereo biopsies. 2 benign. one was radial scar, no excision done for radial scar  . BREAST BIOPSY Left 07/16/2017   BIPHASIC STROMAL AND EPITHELIAL LESION./ Dr Bary Castilla  . BREAST EXCISIONAL BIOPSY Left 1968   x 3 per pt  . BREAST EXCISIONAL BIOPSY Right 2007   benign  . COLONOSCOPY  2011   DR,ISHAKIS  . FOOT SURGERY  2004  . HEEL SPUR SURGERY Left 12/15/2014   Dr. Earleen Newport at [facility] Medications: Prior to Admission medications   Medication Sig Start Date End Date Taking? Authorizing Provider  Cholecalciferol (VITAMIN D-3 PO) Take 1,000 Units by mouth daily.    Yes [provider]  Cinnamon 500 MG capsule Take 1,000 mg by mouth daily.   Yes [provider]  FISH OIL-BORAGE-FLAX-SAFFLOWER PO Take 2 tablets by mouth daily.   Yes [provider]  fluticasone (FLONASE) 50 MCG/ACT nasal spray Place 2 sprays into both nostrils daily. Patient taking differently: Place 2 sprays into both nostrils daily.  10/07/14  Yes Sowles, Drue Stager, MD  ketoconazole (NIZORAL) 2 % cream Apply 1 application topically daily. Patient taking differently: Apply 1 application  topically daily as needed.  10/10/15  Yes Sowles, Drue Stager, MD  loratadine (CLARITIN) 10 MG tablet Take 1 tablet (10 mg total) by mouth daily. 04/11/18  Yes Sowles, Drue Stager, MD  Multiple Vitamins-Minerals (MULTIVITAMIN GUMMIES ADULT PO) Take by mouth daily.   Yes [provider]  POTASSIUM PO Take 1 tablet by mouth daily.    Yes [provider]  triamterene-hydrochlorothiazide (MAXZIDE-25) 37.5-25 MG tablet Take 1 tablet by mouth once daily 07/16/18  Yes Steele Sizer, MD    Allergies: Allergies  Allergen Reactions  . Altace [Ramipril] Swelling    Review of Systems: Review of Systems  Constitutional: Negative for chills and fever.  Skin: Negative for itching and rash.       Negative for breast masses, skin changes, nipple inversion, or discharge  All other systems reviewed and are negative.   Physical Exam BP (!) 157/77   Pulse 90   Temp (!) 97.2 F (36.2 C)   Ht 5\' 7"  (1.702 m)   Wt 202 lb (91.6 kg)   SpO2 98%   BMI 31.64 kg/m  CONSTITUTIONAL: Well appearing female, NAD HEENT:  Normocephalic, atraumatic, RESPIRATORY:   Normal respiratory effort without pathologic use of accessory muscles. BREAST:     - Left: No axillary lymph nodes palpable, there is well healed horizontal incision at the 5 o'clock position with palpable induration, no further masses palpable, no nipple changes, no discharge    - Right: No axillary lymph nodes palpable, there is well healed vertical incision at the  5 o'clock position, no further masses palpable, no nipple changes, no discharge  SKIN: Warm, Dry PYSCH: Normal mood, Normal behavior  Labs/Imaging:  Mammogram + Left Breast US (07/30/2018) reviewed and radiologist report reviewed:  IMPRESSION: No mammographic evidence of malignancy in either breast.  Stable bilateral architectural distortions, presumably postsurgical from prior excisional biopsies.  Biopsy-proven radial scar in the right breast, for which  further management should be based on clinical grounds, as patient has declined excisional biopsy.  Decreased in size left breast 5 o'clock mass, confirming benign etiology.  RECOMMENDATION: Screening mammogram in one year.(Code:[id number])    Assessment and Plan: This is a 73 y.o. female who presents for annual mammogram and screening.    - Discussed results of mammogram and [place] with patient, all questions answered  - Continue self breast examinations regularly  - No further surgical intervention needed  - Annual mammogram through our office or PCP per patient choice  - RTC PRN  -- Edison Simon, PA-C [facility] 08/11/2018, 10:55 AM [phone] M-F: 7am - 4pm

## 2018-08-11 NOTE — Patient Instructions (Signed)
To follow up in one year with a bilateral screening mammogram

## 2018-08-12 ENCOUNTER — Ambulatory Visit: Payer: PPO | Admitting: General Surgery

## 2018-09-17 ENCOUNTER — Other Ambulatory Visit: Payer: Self-pay | Admitting: Family Medicine

## 2018-09-17 ENCOUNTER — Telehealth: Payer: Self-pay | Admitting: Family Medicine

## 2018-09-17 DIAGNOSIS — L609 Nail disorder, unspecified: Secondary | ICD-10-CM

## 2018-09-17 DIAGNOSIS — E119 Type 2 diabetes mellitus without complications: Secondary | ICD-10-CM

## 2018-09-17 NOTE — Telephone Encounter (Signed)
Patient is requesting a referral to [facility], due to patient wanting her toenails cut.   [facility] in [place], [place] [address] [phone]  Patient call back [phone]

## 2018-10-06 ENCOUNTER — Other Ambulatory Visit: Payer: Self-pay

## 2018-10-06 ENCOUNTER — Encounter: Payer: Self-pay | Admitting: Podiatry

## 2018-10-06 ENCOUNTER — Ambulatory Visit (INDEPENDENT_AMBULATORY_CARE_PROVIDER_SITE_OTHER): Payer: PPO | Admitting: Podiatry

## 2018-10-06 DIAGNOSIS — B351 Tinea unguium: Secondary | ICD-10-CM | POA: Diagnosis not present

## 2018-10-06 DIAGNOSIS — M79675 Pain in left toe(s): Secondary | ICD-10-CM

## 2018-10-06 DIAGNOSIS — E119 Type 2 diabetes mellitus without complications: Secondary | ICD-10-CM

## 2018-10-06 DIAGNOSIS — M79674 Pain in right toe(s): Secondary | ICD-10-CM | POA: Diagnosis not present

## 2018-10-06 NOTE — Progress Notes (Signed)
This patient presents to the office with chief complaint of long thick nails and diabetic feet.  This patient  says there  is  no pain and discomfort in their feet.  This patient says there are long thick painful nails.  These nails are painful walking and wearing shoes.  Patient has no history of infection or drainage from both feet.  Patient is unable to  self treat his own nails Patient has history of surgery by Dr.  Jacqualyn Posey.. This patient presents  to the office today for treatment of the  long nails and a foot evaluation due to history of  diabetes.  General Appearance  Alert, conversant and in no acute stress.  Vascular  Dorsalis pedis  are palpable  Bilaterally.Posterior tibial pulses are weakly palpable  B/L.  Capillary return is within normal limits  bilaterally. Temperature is within normal limits  bilaterally.  Neurologic  Senn-Weinstein monofilament wire test within normal limits  bilaterally. Muscle power within normal limits bilaterally.  Nails Thick disfigured discolored nails with subungual debris  from hallux to fifth toes bilaterally. No evidence of bacterial infection or drainage bilaterally.  Orthopedic  No limitations of motion of motion feet .  No crepitus or effusions noted.  No bony pathology or digital deformities noted. Midfoot arthritis  B/L.  Skin  normotropic skin with no porokeratosis noted bilaterally.  No signs of infections or ulcers noted.     Onychomycosis  Diabetes with no foot complications  IE  Debride nails x 10.  A diabetic foot exam was performed and there is no evidence of any vascular or neurologic pathology.   RTC 3 months.   Gardiner Barefoot DPM

## 2018-10-14 ENCOUNTER — Ambulatory Visit (INDEPENDENT_AMBULATORY_CARE_PROVIDER_SITE_OTHER): Payer: PPO

## 2018-10-14 VITALS — Ht 67.0 in | Wt 195.0 lb

## 2018-10-14 DIAGNOSIS — Z0000 Encounter for general adult medical examination without abnormal findings: Secondary | ICD-10-CM

## 2018-10-14 NOTE — Patient Instructions (Signed)
[name] , Thank you for taking time to come for your Medicare Wellness Visit. I appreciate your ongoing commitment to your health goals. Please review the following plan we discussed and let me know if I can assist you in the future.   Screening recommendations/referrals: Colonoscopy: done 8/30/1. Due 2021 Mammogram: done 07/30/18 Bone Density: done 04/23/12.  Recommended yearly ophthalmology/optometry visit for glaucoma screening and checkup Recommended yearly dental visit for hygiene and checkup  Vaccinations: Influenza vaccine: done 10/11/17 Pneumococcal vaccine: done 10/10/15 Tdap vaccine: done 2008 Shingles vaccine: Shingrix discussed. Please contact your pharmacy for coverage information.   Advanced directives: Please bring a copy of your health care power of attorney and living will to the office at your convenience once you have completed those documents.   Conditions/risks identified: Recommend increasing physical activity to at least 3 days per week  Next appointment: Please follow up in one year for your Medicare Annual Wellness visit.     Preventive Care 50 Years and Older, Female Preventive care refers to lifestyle choices and visits with your health care provider that can promote health and wellness. What does preventive care include?  A yearly physical exam. This is also called an annual well check.  Dental exams once or twice a year.  Routine eye exams. Ask your health care provider how often you should have your eyes checked.  Personal lifestyle choices, including:  Daily care of your teeth and gums.  Regular physical activity.  Eating a healthy diet.  Avoiding tobacco and drug use.  Limiting alcohol use.  Practicing safe sex.  Taking low-dose aspirin every day.  Taking vitamin and mineral supplements as recommended by your health care provider. What happens during an annual well check? The services and screenings done by your health care provider  during your annual well check will depend on your age, overall health, lifestyle risk factors, and family history of disease. Counseling  Your health care provider may ask you questions about your:  Alcohol use.  Tobacco use.  Drug use.  Emotional well-being.  Home and relationship well-being.  Sexual activity.  Eating habits.  History of falls.  Memory and ability to understand (cognition).  Work and work Statistician.  Reproductive health. Screening  You may have the following tests or measurements:  Height, weight, and BMI.  Blood pressure.  Lipid and cholesterol levels. These may be checked every 5 years, or more frequently if you are over 62 years old.  Skin check.  Lung cancer screening. You may have this screening every year starting at age 74 if you have a 30-pack-year history of smoking and currently smoke or have quit within the past 15 years.  Fecal occult blood test (FOBT) of the stool. You may have this test every year starting at age 11.  Flexible sigmoidoscopy or colonoscopy. You may have a sigmoidoscopy every 5 years or a colonoscopy every 10 years starting at age 23.  Hepatitis C blood test.  Hepatitis B blood test.  Sexually transmitted disease (STD) testing.  Diabetes screening. This is done by checking your blood sugar (glucose) after you have not eaten for a while (fasting). You may have this done every 1-3 years.  Bone density scan. This is done to screen for osteoporosis. You may have this done starting at age 34.  Mammogram. This may be done every 1-2 years. Talk to your health care provider about how often you should have regular mammograms. Talk with your health care provider about your test results,  treatment options, and if necessary, the need for more tests. Vaccines  Your health care provider may recommend certain vaccines, such as:  Influenza vaccine. This is recommended every year.  Tetanus, diphtheria, and acellular pertussis  (Tdap, Td) vaccine. You may need a Td booster every 10 years.  Zoster vaccine. You may need this after age 18.  Pneumococcal 13-valent conjugate (PCV13) vaccine. One dose is recommended after age 15.  Pneumococcal polysaccharide (PPSV23) vaccine. One dose is recommended after age 58. Talk to your health care provider about which screenings and vaccines you need and how often you need them. This information is not intended to replace advice given to you by your health care provider. Make sure you discuss any questions you have with your health care provider. Document Released: 01/28/2015 Document Revised: 09/21/2015 Document Reviewed: 11/02/2014 Elsevier Interactive Patient Education  2017 [place] Prevention in the Home Falls can cause injuries. They can happen to people of all ages. There are many things you can do to make your home safe and to help prevent falls. What can I do on the outside of my home?  Regularly fix the edges of walkways and driveways and fix any cracks.  Remove anything that might make you trip as you walk through a door, such as a raised step or threshold.  Trim any bushes or trees on the path to your home.  Use bright outdoor lighting.  Clear any walking paths of anything that might make someone trip, such as rocks or tools.  Regularly check to see if handrails are loose or broken. Make sure that both sides of any steps have handrails.  Any raised decks and porches should have guardrails on the edges.  Have any leaves, snow, or ice cleared regularly.  Use sand or salt on walking paths during winter.  Clean up any spills in your garage right away. This includes oil or grease spills. What can I do in the bathroom?  Use night lights.  Install grab bars by the toilet and in the tub and shower. Do not use towel bars as grab bars.  Use non-skid mats or decals in the tub or shower.  If you need to sit down in the shower, use a plastic, non-slip  stool.  Keep the floor dry. Clean up any water that spills on the floor as soon as it happens.  Remove soap buildup in the tub or shower regularly.  Attach bath mats securely with double-sided non-slip rug tape.  Do not have throw rugs and other things on the floor that can make you trip. What can I do in the bedroom?  Use night lights.  Make sure that you have a light by your bed that is easy to reach.  Do not use any sheets or blankets that are too big for your bed. They should not hang down onto the floor.  Have a firm chair that has side arms. You can use this for support while you get dressed.  Do not have throw rugs and other things on the floor that can make you trip. What can I do in the kitchen?  Clean up any spills right away.  Avoid walking on wet floors.  Keep items that you use a lot in easy-to-reach places.  If you need to reach something above you, use a strong step stool that has a grab bar.  Keep electrical cords out of the way.  Do not use floor polish or wax that makes floors  slippery. If you must use wax, use non-skid floor wax.  Do not have throw rugs and other things on the floor that can make you trip. What can I do with my stairs?  Do not leave any items on the stairs.  Make sure that there are handrails on both sides of the stairs and use them. Fix handrails that are broken or loose. Make sure that handrails are as long as the stairways.  Check any carpeting to make sure that it is firmly attached to the stairs. Fix any carpet that is loose or worn.  Avoid having throw rugs at the top or bottom of the stairs. If you do have throw rugs, attach them to the floor with carpet tape.  Make sure that you have a light switch at the top of the stairs and the bottom of the stairs. If you do not have them, ask someone to add them for you. What else can I do to help prevent falls?  Wear shoes that:  Do not have high heels.  Have rubber bottoms.  Are  comfortable and fit you well.  Are closed at the toe. Do not wear sandals.  If you use a stepladder:  Make sure that it is fully opened. Do not climb a closed stepladder.  Make sure that both sides of the stepladder are locked into place.  Ask someone to hold it for you, if possible.  Clearly mark and make sure that you can see:  Any grab bars or handrails.  First and last steps.  Where the edge of each step is.  Use tools that help you move around (mobility aids) if they are needed. These include:  Canes.  Walkers.  Scooters.  Crutches.  Turn on the lights when you go into a dark area. Replace any light bulbs as soon as they burn out.  Set up your furniture so you have a clear path. Avoid moving your furniture around.  If any of your floors are uneven, fix them.  If there are any pets around you, be aware of where they are.  Review your medicines with your doctor. Some medicines can make you feel dizzy. This can increase your chance of falling. Ask your doctor what other things that you can do to help prevent falls. This information is not intended to replace advice given to you by your health care provider. Make sure you discuss any questions you have with your health care provider. Document Released: 10/28/2008 Document Revised: 06/09/2015 Document Reviewed: 02/05/2014 Elsevier Interactive Patient Education  2017 [employer].

## 2018-10-14 NOTE — Progress Notes (Signed)
Subjective:   Stacy [name] is a 73 y.o. female who presents for Medicare Annual (Subsequent) preventive examination.  Virtual Visit via Telephone Note  I connected with Stacy [name] on 10/14/18 at  9:20 AM EDT by telephone and verified that I am speaking with the correct person using two identifiers.  Medicare Annual Wellness visit completed telephonically due to Covid-19 pandemic.   Location: Patient: home Provider: office   I discussed the limitations, risks, security and privacy concerns of performing an evaluation and management service by telephone and the availability of in person appointments. The patient expressed understanding and agreed to proceed.  Some vital signs may be absent or patient reported.   Clemetine Marker, LPN    Review of Systems:   Cardiac Risk Factors include: advanced age (>51men, >28 women);hypertension;obesity (BMI >30kg/m2)     Objective:     Vitals: Ht 5\' 7"  (1.702 m)   Wt 195 lb (88.5 kg)   BMI 30.54 kg/m   Body mass index is 30.54 kg/m.  Advanced Directives 10/14/2018 10/11/2017 10/08/2016 04/09/2016 10/10/2015 04/06/2015 10/07/2014  Does Patient Have a Medical Advance Directive? No No No No No No No  Would patient like information on creating a medical advance directive? No - Patient declined Yes (MAU/Ambulatory/Procedural Areas - Information given) No - Patient declined - No - patient declined information No - patient declined information -    Tobacco Social History   Tobacco Use  Smoking Status Never Smoker  Smokeless Tobacco Never Used  Tobacco Comment   smoking cessation materials not required     Counseling given: Not Answered Comment: smoking cessation materials not required   Clinical Intake:  Pre-visit preparation completed: Yes  Pain : No/denies pain     BMI - recorded: 30.54 Nutritional Status: BMI > 30  Obese Nutritional Risks: None Diabetes: Yes CBG done?: No Did pt. bring in CBG monitor from home?: No   Nutrition Risk Assessment:  Has the patient had any N/V/D within the last 2 months?  No  Does the patient have any non-healing wounds?  No  Has the patient had any unintentional weight loss or weight gain?  No   Diabetes:  Is the patient diabetic?  Yes  If diabetic, was a CBG obtained today?  No  Did the patient bring in their glucometer from home?  No  How often do you monitor your CBG's? Pt does not actively check blood sugar.   Financial Strains and Diabetes Management:  Are you having any financial strains with the device, your supplies or your medication? No .  Does the patient want to be seen by Chronic Care Management for management of their diabetes?  No  Would the patient like to be referred to a Nutritionist or for Diabetic Management?  No   Diabetic Exams:  Diabetic Eye Exam: Completed 11/12/17 negative retinopathy.   Diabetic Foot Exam: Completed 10/06/18.   How often do you need to have someone help you when you read instructions, pamphlets, or other written materials from your doctor or pharmacy?: 1 - Never  Interpreter Needed?: No  Information entered by :: Clemetine Marker LPN  Past Medical History:  Diagnosis Date  . Hypertension   . Neoplasm of uncertain behavior of breast 2007   Past Surgical History:  Procedure Laterality Date  . BREAST BIOPSY Right 2015   3 stereo biopsies. 2 benign. one was radial scar, no excision done for radial scar  . BREAST BIOPSY Left 07/16/2017   BIPHASIC  STROMAL AND EPITHELIAL LESION./ Dr Bary Castilla  . BREAST EXCISIONAL BIOPSY Left 1968   x 3 per pt  . BREAST EXCISIONAL BIOPSY Right 2007   benign  . COLONOSCOPY  2011   DR,ISHAKIS  . FOOT SURGERY  2004  . HEEL SPUR SURGERY Left 12/15/2014   [facility] Ear[facility]   Family History  Problem Relation Age of Onset  . Breast cancer Sister 54  . Arthritis Mother   . Stroke Paternal Grandmother   . Cancer Brother 51       kidney  . Cancer Maternal Grandmother         Gallbladder   Social History   Socioeconomic History  . Marital status: Married    Spouse name: Richard  . Number of children: 1  . Years of education: Not on file  . Highest education level: 12th grade  Occupational History  . 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 Financial resource strain: Not hard at all  . Food insecurity    Worry: Never true    Inability: Never true  . Transportation needs    Medical: No    Non-medical: No  Tobacco Use  . Smoking status: Never Smoker  . Smokeless tobacco: Never Used  . Tobacco comment: smoking cessation materials not required  Substance and Sexual Activity  . Alcohol use: No    Alcohol/week: 0.0 standard drinks  . Drug use: No  . Sexual activity: Not Currently    Comment: Husband has prostate issues  Lifestyle  . Physical activity    Days per week: 0 days    Minutes per session: 0 min  . Stress: Not at all  Relationships  . Social Herbalist on phone: Patient refused    Gets together: Patient refused    Attends religious service: Patient refused    Active member of club or organization: Patient refused    Attends meetings of clubs or organizations: Patient refused    Relationship status: Married  Other Topics Concern  . Not on file  Social History Narrative  . Not on file    Outpatient Encounter Medications as of 10/14/2018  Medication Sig  . Cholecalciferol (VITAMIN D-3 PO) Take 1,000 Units by mouth daily.   . Cinnamon 500 MG capsule Take 1,000 mg by mouth daily.  Marland Kitchen FISH OIL-BORAGE-FLAX-SAFFLOWER PO Take 2 tablets by mouth daily.  . fluticasone (FLONASE) 50 MCG/ACT nasal spray Place 2 sprays into both nostrils daily. (Patient taking differently: Place 2 sprays into both nostrils daily. )  . ketoconazole (NIZORAL) 2 % cream Apply 1 application topically daily. (Patient taking differently: Apply 1 application topically daily as needed. )  . loratadine (CLARITIN) 10 MG tablet Take 1 tablet (10  mg total) by mouth daily.  . Multiple Vitamins-Minerals (MULTIVITAMIN GUMMIES ADULT PO) Take by mouth daily.  Marland Kitchen POTASSIUM PO Take 1 tablet by mouth daily.   Marland Kitchen triamterene-hydrochlorothiazide (MAXZIDE-25) 37.5-25 MG tablet Take 1 tablet by mouth once daily (Patient taking differently: 0.5 tablets daily. )   No facility-administered encounter medications on file as of 10/14/2018.     Activities of Daily Living In your present state of health, do you have any difficulty performing the following activities: 10/14/2018 07/22/2018  Hearing? N N  Comment declines hearing aids -  Vision? N N  Difficulty concentrating or making decisions? N N  Walking or climbing stairs? N N  Dressing or bathing?  N N  Doing errands, shopping? N N  Preparing Food and eating ? N -  Using the Toilet? N -  In the past six months, have you accidently leaked urine? N -  Do you have problems with loss of bowel control? N -  Managing your Medications? N -  Managing your Finances? N -  Housekeeping or managing your Housekeeping? N -  Some recent data might be hidden    Patient Care Team: Steele Sizer, MD as PCP - General [place], Reed Breech, OD as Consulting Physician (Optometry) Bary Castilla, Forest Gleason, MD as Consulting Physician (General Surgery)    Assessment:   This is a routine wellness examination for Alliene.  Exercise Activities and Dietary recommendations Current Exercise Habits: The patient does not participate in regular exercise at present, Exercise limited by: None identified  Goals    . DIET - REDUCE SUGAR INTAKE     Recommend to eliminate sweets and to decrease portion sizes by eating 3 small healthy meals and at least 2 healthy snacks per day.     . Exercise 3x per week (20-30 min per time)     Recommend to exercise 3x per week for 20-30 min per time.       Fall Risk Fall Risk  10/14/2018 08/11/2018 07/22/2018 04/10/2018 10/11/2017  Falls in the past year? 0 0 0 0 No  Number falls in past yr: 0 0 0 0 -   Injury with Fall? 0 0 0 0 -  Risk for fall due to : - - - - Impaired vision  Risk for fall due to: Comment - - - - wears eyeglasses  Follow up Falls prevention discussed - - - -   FALL RISK PREVENTION PERTAINING TO THE HOME:  Any stairs in or around the home? Yes  If so, do they handrails? Yes   Home free of loose throw rugs in walkways, pet beds, electrical cords, etc? Yes  Adequate lighting in your home to reduce risk of falls? Yes   ASSISTIVE DEVICES UTILIZED TO PREVENT FALLS:  Life alert? No  Use of a cane, walker or w/c? No  Grab bars in the bathroom? Yes  Shower chair or bench in shower? Yes  Elevated toilet seat or a handicapped toilet? Yes   DME ORDERS:  DME order needed?  No   TIMED UP AND GO:  Was the test performed? No . Telephonic visit.   Education: Fall risk prevention has been discussed.  Intervention(s) required? No    Depression Screen PHQ 2/9 Scores 10/14/2018 07/22/2018 04/10/2018 10/11/2017  PHQ - 2 Score 0 0 0 0  PHQ- 9 Score - 0 0 0     Cognitive Function     6CIT Screen 10/14/2018 10/11/2017 10/08/2016  What Year? 0 points 0 points 0 points  What month? 0 points 0 points 0 points  What time? 0 points 0 points 0 points  Count back from 20 0 points 0 points 0 points  Months in reverse 0 points 0 points 0 points  Repeat phrase 2 points 0 points 4 points  Total Score 2 0 4    Immunization History  Administered Date(s) Administered  . Influenza, High Dose Seasonal PF 10/07/2014, 10/10/2015, 10/11/2016, 10/11/2017  . Influenza-Unspecified 03/20/2012, 10/01/2013  . Pneumococcal Conjugate-13 10/07/2014  . Pneumococcal Polysaccharide-23 07/27/2009, 10/10/2015  . Tdap 07/16/2006  . Zoster 10/19/2009    Qualifies for Shingles Vaccine? Yes  Zostavax completed 2011. Due for Shingrix. Education has been provided regarding the  importance of this vaccine. Pt has been advised to call insurance company to determine out of pocket expense. Advised may also  receive vaccine at local pharmacy or Health Dept. Verbalized acceptance and understanding.  Tdap: Although this vaccine is not a covered service during a Wellness Exam, does the patient still wish to receive this vaccine today?  No .  Education has been provided regarding the importance of this vaccine. Advised may receive this vaccine at local pharmacy or Health Dept. Aware to provide a copy of the vaccination record if obtained from local pharmacy or Health Dept. Verbalized acceptance and understanding.  Flu Vaccine: Due for Flu vaccine. Does the patient want to receive this vaccine today?  No . Education has been provided regarding the importance of this vaccine but still declined. Advised may receive this vaccine at local pharmacy or Health Dept. Aware to provide a copy of the vaccination record if obtained from local pharmacy or Health Dept. Verbalized acceptance and understanding.  Pneumococcal Vaccine: Up to date   Screening Tests Health Maintenance  Topic Date Due  . TETANUS/TDAP  07/15/2016  . INFLUENZA VACCINE  08/16/2018  . URINE MICROALBUMIN  10/12/2018  . OPHTHALMOLOGY EXAM  11/13/2018  . HEMOGLOBIN A1C  01/22/2019  . MAMMOGRAM  07/30/2019  . COLONOSCOPY  08/30/2019  . FOOT EXAM  10/06/2019  . DEXA SCAN  Completed  . Hepatitis C Screening  Completed  . PNA vac Low Risk Adult  Completed    Cancer Screenings:  Colorectal Screening: Completed 09/13/09. Repeat every 10 years  Mammogram: Completed 07/30/18. Repeat every year  Bone Density: Completed 04/23/12. Results reflect NORMAL. Repeat every 2 years. Pt requests to order next year.   Lung Cancer Screening: (Low Dose CT Chest recommended if Age 59-80 years, 30 pack-year currently smoking OR have quit w/in 15years.) does not qualify.    Additional Screening:  Hepatitis C Screening: does qualify; Completed 09/28/11.  Vision Screening: Recommended annual ophthalmology exams for early detection of glaucoma and other  disorders of the eye. Is the patient up to date with their annual eye exam?  Yes  Who is the provider or what is the name of the office in which the pt attends annual eye exams? Dr. Matilde Sprang  Dental Screening: Recommended annual dental exams for proper oral hygiene  Community Resource Referral:  CRR required this visit?  No      Plan:     I have personally reviewed and addressed the Medicare Annual Wellness questionnaire and have noted the following in the patient's chart:  A. Medical and social history B. Use of alcohol, tobacco or illicit drugs  C. Current medications and supplements D. Functional ability and status E.  Nutritional status F.  Physical activity G. Advance directives H. List of other physicians I.  Hospitalizations, surgeries, and ER visits in previous 12 months J.  [place] such as hearing and vision if needed, cognitive and depression L. Referrals and appointments   In addition, I have reviewed and discussed with patient certain preventive protocols, quality metrics, and best practice recommendations. A written personalized care plan for preventive services as well as general preventive health recommendations were provided to patient.   Signed,  Clemetine Marker, LPN Nurse Health Advisor   Nurse Notes: pt doing well and appreciative of visit today.

## 2018-11-13 ENCOUNTER — Telehealth: Payer: Self-pay | Admitting: Family Medicine

## 2018-11-13 NOTE — Chronic Care Management (AMB) (Signed)
Chronic Care Management   Note  11/13/2018 Name: Stacy [name] MRN: [id number] DOB: January 14, 1946  Stacy [name] is a 73 y.o. year old female who is a primary care patient of Steele Sizer, MD. I reached out to [name] by phone today in response to a referral sent by [name]'s health plan.     [name] was given information about Chronic Care Management services today including:  1. CCM service includes personalized support from designated clinical staff supervised by her physician, including individualized plan of care and coordination with other care providers 2. 24/7 contact phone numbers for assistance for urgent and routine care needs. 3. Service will only be billed when office clinical staff spend 20 minutes or more in a month to coordinate care. 4. Only one practitioner may furnish and bill the service in a calendar month. 5. The patient may stop CCM services at any time (effective at the end of the month) by phone call to the office staff. 6. The patient will be responsible for cost sharing (co-pay) of up to 20% of the service fee (after annual deductible is met).  Patient agreed to services and verbal consent obtained.   Follow up plan: Telephone appointment with CCM team member scheduled for: 12/12/2018  [place]  ??[email or web address]   ??[phone]

## 2018-12-12 ENCOUNTER — Telehealth: Payer: PPO

## 2018-12-18 ENCOUNTER — Other Ambulatory Visit: Payer: Self-pay | Admitting: Family Medicine

## 2018-12-18 DIAGNOSIS — I10 Essential (primary) hypertension: Secondary | ICD-10-CM

## 2018-12-25 DIAGNOSIS — H1045 Other chronic allergic conjunctivitis: Secondary | ICD-10-CM | POA: Diagnosis not present

## 2018-12-25 DIAGNOSIS — E119 Type 2 diabetes mellitus without complications: Secondary | ICD-10-CM | POA: Diagnosis not present

## 2018-12-25 DIAGNOSIS — H25013 Cortical age-related cataract, bilateral: Secondary | ICD-10-CM | POA: Diagnosis not present

## 2018-12-25 DIAGNOSIS — Z7984 Long term (current) use of oral hypoglycemic drugs: Secondary | ICD-10-CM | POA: Diagnosis not present

## 2018-12-25 DIAGNOSIS — H2513 Age-related nuclear cataract, bilateral: Secondary | ICD-10-CM | POA: Diagnosis not present

## 2018-12-29 LAB — HM DIABETES EYE EXAM

## 2018-12-31 ENCOUNTER — Telehealth: Payer: Self-pay

## 2019-01-05 ENCOUNTER — Encounter: Payer: Self-pay | Admitting: Podiatry

## 2019-01-05 ENCOUNTER — Other Ambulatory Visit: Payer: Self-pay

## 2019-01-05 ENCOUNTER — Ambulatory Visit (INDEPENDENT_AMBULATORY_CARE_PROVIDER_SITE_OTHER): Payer: PPO | Admitting: Podiatry

## 2019-01-05 DIAGNOSIS — E119 Type 2 diabetes mellitus without complications: Secondary | ICD-10-CM | POA: Diagnosis not present

## 2019-01-05 DIAGNOSIS — M79675 Pain in left toe(s): Secondary | ICD-10-CM

## 2019-01-05 DIAGNOSIS — B351 Tinea unguium: Secondary | ICD-10-CM | POA: Diagnosis not present

## 2019-01-05 DIAGNOSIS — M79674 Pain in right toe(s): Secondary | ICD-10-CM

## 2019-01-05 NOTE — Progress Notes (Signed)
Complaint:  Visit Type: Patient returns to my office for continued preventative foot care services. Complaint: Patient states" my nails have grown long and thick and become painful to walk and wear shoes" Patient has been diagnosed with DM with no foot complications. The patient presents for preventative foot care services.  Podiatric Exam: Vascular: dorsalis pedis and posterior tibial pulses are palpable bilateral. Capillary return is immediate. Temperature gradient is WNL. Skin turgor WNL  Sensorium: Normal Semmes Weinstein monofilament test. Normal tactile sensation bilaterally. Nail Exam: Pt has thick disfigured discolored nails with subungual debris noted bilateral entire nail hallux through fifth toenails Ulcer Exam: There is no evidence of ulcer or pre-ulcerative changes or infection. Orthopedic Exam: Muscle tone and strength are WNL. No limitations in general ROM. No crepitus or effusions noted. Foot type and digits show no abnormalities. Bony prominences are unremarkable. Skin: No Porokeratosis. No infection or ulcers  Diagnosis:  Onychomycosis, , Pain in right toe, pain in left toes  Treatment & Plan Procedures and Treatment: Consent by patient was obtained for treatment procedures.   Debridement of mycotic and hypertrophic toenails, 1 through 5 bilateral and clearing of subungual debris. No ulceration, no infection noted.  Return Visit-Office Procedure: Patient instructed to return to the office for a follow up visit 3 months for continued evaluation and treatment.    Maricia Scotti DPM 

## 2019-01-22 ENCOUNTER — Ambulatory Visit: Payer: PPO | Admitting: Family Medicine

## 2019-01-23 ENCOUNTER — Encounter: Payer: Self-pay | Admitting: Family Medicine

## 2019-01-23 ENCOUNTER — Other Ambulatory Visit: Payer: Self-pay

## 2019-01-23 ENCOUNTER — Ambulatory Visit (INDEPENDENT_AMBULATORY_CARE_PROVIDER_SITE_OTHER): Payer: PPO | Admitting: Family Medicine

## 2019-01-23 VITALS — Ht 67.0 in | Wt 198.0 lb

## 2019-01-23 DIAGNOSIS — B351 Tinea unguium: Secondary | ICD-10-CM | POA: Diagnosis not present

## 2019-01-23 DIAGNOSIS — E559 Vitamin D deficiency, unspecified: Secondary | ICD-10-CM | POA: Diagnosis not present

## 2019-01-23 DIAGNOSIS — R600 Localized edema: Secondary | ICD-10-CM | POA: Diagnosis not present

## 2019-01-23 DIAGNOSIS — I10 Essential (primary) hypertension: Secondary | ICD-10-CM

## 2019-01-23 DIAGNOSIS — E1169 Type 2 diabetes mellitus with other specified complication: Secondary | ICD-10-CM

## 2019-01-23 DIAGNOSIS — E119 Type 2 diabetes mellitus without complications: Secondary | ICD-10-CM | POA: Diagnosis not present

## 2019-01-23 MED ORDER — ROSUVASTATIN CALCIUM 5 MG PO TABS: 5.0000 mg | ORAL_TABLET | ORAL | 1 refills | Status: DC

## 2019-01-23 NOTE — Progress Notes (Signed)
Name: Stacy [name]   MRN: [id number]    DOB: 10-09-1945   Date:01/23/2019       Progress Note  Subjective  Chief Complaint  Chief Complaint  Patient presents with  . Medication Refill  . Diabetes  . Hypertension  . Obesity  . Vitamin D deficiency    I connected with  [name] on 01/23/19 at 11:40 AM EST by telephone and verified that I am speaking with the correct person using two identifiers.  I discussed the limitations, risks, security and privacy concerns of performing an evaluation and management service by telephone and the availability of in person appointments. Staff also discussed with the patient that there may be a patient responsible charge related to this service. Patient Location: at home  Provider Location: [facility]   HPI  DMII: doing well, on diet only, can't take ACE caused angioedema.. Denies polyphagia, polydipsia or polyuria. She is following a diabetic diet. Discussed starting her on statin and she agreed on taking Crestor 5 mg M, W and Fridays and recheck labs next visit . She had recent visit with her podiatrist  HTN: taking diuretic, tolerating it well. Denies chest pain, palpitation, orthopneaor SOB,lower extremity swelling is mild, leg cramping decreased since we went down on dose of diuretic . We went down on bp medication to half to one daily and bp was controlled when she returned for a nurse visit after medication adjustment   Obesity: she has DM and HTN, discussed life style modification.  Vitamin D deficiency: continue supplementation, last labs was at goal.   Patient Active Problem List   Diagnosis Date Noted  . Pain due to onychomycosis of toenails of both feet 10/06/2018  . Diabetes mellitus without complication ([place]) [id number]  . Leg edema 12/11/2016  . Abnormal EKG 12/10/2016  . Radial scar of breast 05/30/2015  . History of foot surgery 12/15/2014  . Hypertension, benign 10/07/2014  . Seasonal  allergies 10/07/2014  . Diabetes mellitus type 2, diet-controlled ([place]) 10/07/2014  . Morbid obesity ([place]) 10/07/2014  . Vitamin D deficiency 10/07/2014  . Varicose veins 10/07/2014  . Left Achilles tendinitis 10/07/2014  . Family history of breast cancer 05/12/2012  . 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 BREAST BIOPSY Right 2015   3 stereo biopsies. 2 benign. one was radial scar, no excision done for radial scar  . BREAST BIOPSY Left 07/16/2017   BIPHASIC STROMAL AND EPITHELIAL LESION./ Dr Bary Castilla  . BREAST EXCISIONAL BIOPSY Left 1968   x 3 per pt  . BREAST EXCISIONAL BIOPSY Right 2007   benign  . COLONOSCOPY  2011   DR,ISHAKIS  . FOOT SURGERY  2004  . HEEL SPUR SURGERY Left 12/15/2014   Dr. Earleen Newport [facility]    Family History  Problem Relation Age of Onset  . Breast cancer Sister 69  . Arthritis Mother   . Stroke Paternal Grandmother   . Cancer Brother 46       kidney  . Cancer Maternal Grandmother        Gallbladder    Social History   Socioeconomic History  . Marital status: Married    Spouse name: Richard  . Number of children: 1  . Years of education: Not on file  . Highest education level: 12th grade  Occupational History  . 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 Smoking status: Never Smoker  . Smokeless tobacco: Never Used  . Tobacco comment: smoking cessation materials not required  Substance and Sexual Activity  . Alcohol use: No    Alcohol/week: 0.0 standard drinks  . Drug use: No  . Sexual activity: Not Currently    Comment: Husband has prostate issues  Other Topics Concern  . Not on file  Social History Narrative  . Not on file   Social Determinants of Health   Financial Resource Strain:   . Difficulty of Paying Living Expenses: Not on file  Food Insecurity:   . Worried About Charity fundraiser in the Last Year: Not on file  . Ran Out  of Food in the Last Year: Not on file  Transportation Needs:   . Lack of Transportation (Medical): Not on file  . Lack of Transportation (Non-Medical): Not on file  Physical Activity:   . Days of Exercise per Week: Not on file  . Minutes of Exercise per Session: Not on file  Stress:   . Feeling of Stress : Not on file  Social Connections:   . Frequency of Communication with Friends and Family: Not on file  . Frequency of Social Gatherings with Friends and Family: Not on file  . Attends Religious Services: Not on file  . Active Member of Clubs or Organizations: Not on file  . Attends Archivist Meetings: Not on file  . Marital Status: Not on file  Intimate Partner Violence:   . Fear of Current or Ex-Partner: Not on file  . Emotionally Abused: Not on file  . Physically Abused: Not on file  . Sexually Abused: Not on file     Current Outpatient Medications:  .  Cholecalciferol (VITAMIN D-3 PO), Take 1,000 Units by mouth daily. , Disp: , Rfl:  .  Cinnamon 500 MG capsule, Take 1,000 mg by mouth daily., Disp: , Rfl:  .  FISH OIL-BORAGE-FLAX-SAFFLOWER PO, Take 2 tablets by mouth daily., Disp: , Rfl:  .  ketoconazole (NIZORAL) 2 % cream, Apply 1 application topically daily. (Patient taking differently: Apply 1 application topically daily as needed. ), Disp: 60 g, Rfl: 0 .  loratadine (CLARITIN) 10 MG tablet, Take 1 tablet (10 mg total) by mouth daily., Disp: 90 tablet, Rfl: 1 .  Multiple Vitamins-Minerals (MULTIVITAMIN GUMMIES ADULT PO), Take by mouth daily., Disp: , Rfl:  .  olopatadine (PATANOL) 0.1 % ophthalmic solution, 1 drop 2 (two) times daily., Disp: , Rfl:  .  POTASSIUM PO, Take 1 tablet by mouth daily. , Disp: , Rfl:  .  triamterene-hydrochlorothiazide (MAXZIDE-25) 37.5-25 MG tablet, Take 1 tablet by mouth once daily, Disp: 90 tablet, Rfl: 0 .  fluticasone (FLONASE) 50 MCG/ACT nasal spray, Place 2 sprays into both nostrils daily. (Patient not taking: Reported on  01/23/2019), Disp: 16 g, Rfl: 1  Allergies  Allergen Reactions  . Altace [Ramipril] Swelling    I personally reviewed active problem list, medication list, allergies, family history, social history with the patient/caregiver today.   ROS  Ten systems reviewed and is negative except as mentioned in HPI   Objective  Virtual encounter, vitals not obtained.  Body mass index is 31.01 kg/m.  Physical Exam  Awake, alert, oriented  PHQ2/9: Depression screen [facility] 2/9 01/23/2019 10/14/2018 07/22/2018 04/10/2018 10/11/2017  Decreased Interest 0 0 0 0 0  Down, Depressed, Hopeless 0 0 0 0 0  PHQ - 2 Score 0 0 0 0 0  Altered sleeping  0 - 0 0 0  Tired, decreased energy 0 - 0 0 0  Change in appetite 0 - 0 0 0  Feeling bad or failure about yourself  0 - 0 0 0  Trouble concentrating 0 - 0 0 0  Moving slowly or fidgety/restless 0 - 0 0 0  Suicidal thoughts 0 - 0 0 0  PHQ-9 Score 0 - 0 0 0  Difficult doing work/chores Not difficult at all - - Not difficult at all Not difficult at all   PHQ-2/9 Result is negative.    Fall Risk: Fall Risk  01/23/2019 10/14/2018 08/11/2018 07/22/2018 04/10/2018  Falls in the past year? 0 0 0 0 0  Number falls in past yr: 0 0 0 0 0  Injury with Fall? 0 0 0 0 0  Risk for fall due to : - - - - -  Risk for fall due to: Comment - - - - -  Follow up - Falls prevention discussed - - -    Assessment & Plan  1. Hypertension, benign  bp at dentist was high but she will return for cma check with [place]   2. Diabetes mellitus type 2, diet-controlled ([place])  She will come in for labs in one month , also needs Flu vaccine and urine micro , she had an eye exam and will contact them so we can get a copy   3. Vitamin D deficiency  Continue supplementation   4. Leg edema  Doing well   5. Onychomycosis of multiple toenails with type 2 diabetes mellitus ([place])  Seen by Dr. Prudence Davidson and is doing well   I discussed the assessment and treatment plan with the patient. The patient  was provided an opportunity to ask questions and all were answered. The patient agreed with the plan and demonstrated an understanding of the instructions.   The patient was advised to call back or seek an in-person evaluation if the symptoms worsen or if the condition fails to improve as anticipated.  I provided 25 minutes of non-face-to-face time during this encounter.  Loistine Chance, MD

## 2019-01-26 ENCOUNTER — Ambulatory Visit (INDEPENDENT_AMBULATORY_CARE_PROVIDER_SITE_OTHER): Payer: PPO

## 2019-01-26 ENCOUNTER — Encounter: Payer: Self-pay | Admitting: Family Medicine

## 2019-01-26 DIAGNOSIS — E669 Obesity, unspecified: Secondary | ICD-10-CM | POA: Diagnosis not present

## 2019-01-26 DIAGNOSIS — E1169 Type 2 diabetes mellitus with other specified complication: Secondary | ICD-10-CM | POA: Diagnosis not present

## 2019-01-26 DIAGNOSIS — I10 Essential (primary) hypertension: Secondary | ICD-10-CM

## 2019-01-26 NOTE — Chronic Care Management (AMB) (Signed)
Chronic Care Management   Initial Visit Note  01/26/2019 Name: Stacy [name] MRN: [id number] DOB: [birth year]  Primary Care Provider: Steele Sizer, MD Reason for referral : Chronic Care Management  Stacy [name] is a 74 y.o. year old female who is a primary care patient of Steele Sizer, MD. The CCM team was consulted for assistance with chronic disease management and care coordination.  Review of Mrs. Amirault status, including review of consultants reports, relevant labs and test results was conducted today. Collaboration with appropriate care team members was performed as part of the comprehensive evaluation and provision of chronic care management services.     SDOH (Social Determinants of Health) screening performed today.  See Care Plan for related entries.   Medications: Outpatient Encounter Medications as of 01/26/2019  Medication Sig  . Cholecalciferol (VITAMIN D-3 PO) Take 1,000 Units by mouth daily.   . Cinnamon 500 MG capsule Take 1,000 mg by mouth daily.  Marland Kitchen FISH OIL-BORAGE-FLAX-SAFFLOWER PO Take 2 tablets by mouth daily.  Marland Kitchen ketoconazole (NIZORAL) 2 % cream Apply 1 application topically daily. (Patient taking differently: Apply 1 application topically daily as needed. )  . loratadine (CLARITIN) 10 MG tablet Take 1 tablet (10 mg total) by mouth daily.  . Multiple Vitamins-Minerals (MULTIVITAMIN GUMMIES ADULT PO) Take by mouth daily.  Marland Kitchen olopatadine (PATANOL) 0.1 % ophthalmic solution 1 drop 2 (two) times daily.  Marland Kitchen POTASSIUM PO Take 1 tablet by mouth daily.   . rosuvastatin (CRESTOR) 5 MG tablet Take 1 tablet (5 mg total) by mouth 3 (three) times a week.  . triamterene-hydrochlorothiazide (MAXZIDE-25) 37.5-25 MG tablet Take 1 tablet by mouth once daily  . fluticasone (FLONASE) 50 MCG/ACT nasal spray Place 2 sprays into both nostrils daily. (Patient not taking: Reported on 01/23/2019)   No facility-administered encounter medications on file as of 01/26/2019.      Objective:  BP Readings from Last 3 Encounters:  08/11/18 (!) 157/77  07/22/18 108/70  10/11/17 112/68    Lab Results  Component Value Date   HGBA1C 6.1 (A) 07/22/2018     Lab Results  Component Value Date   CHOL 162 07/22/2018   CHOL 161 10/16/2016   CHOL 155 10/10/2015   Lab Results  Component Value Date   HDL 70 07/22/2018   HDL 77 10/16/2016   HDL 76 10/10/2015   Lab Results  Component Value Date   LDLCALC 78 07/22/2018   LDLCALC 67 10/16/2016   LDLCALC 64 10/10/2015   Lab Results  Component Value Date   TRIG 62 07/22/2018   TRIG 87 10/16/2016   TRIG 75 10/10/2015   Lab Results  Component Value Date   CHOLHDL 2.3 07/22/2018   CHOLHDL 2.1 10/16/2016   CHOLHDL 2.0 10/10/2015    Wt Readings from Last 3 Encounters:  01/23/19 198 lb (89.8 kg)  10/14/18 195 lb (88.5 kg)  08/11/18 202 lb (91.6 kg)   Functional Status Survey: Is the patient deaf or have difficulty hearing?: No Does the patient have difficulty seeing, even when wearing glasses/contacts?: Yes(Reports improved vision. Requires glassess. Last eye exam on 12/25/18.) Does the patient have difficulty concentrating, remembering, or making decisions?: No Does the patient have difficulty walking or climbing stairs?: No Does the patient have difficulty dressing or bathing?: No Does the patient have difficulty doing errands alone such as visiting a doctor's office or shopping?: No  Fall Risk  01/26/2019 01/23/2019 10/14/2018 08/11/2018 07/22/2018  Falls in the past year? 0 0 0 0 0  Number falls in past yr: - 0 0 0 0  Injury with Fall? - 0 0 0 0  Risk for fall due to : - - - - -  Risk for fall due to: Comment - - - - -  Follow up Falls prevention discussed - Falls prevention discussed - -     Depression screen [facility] 2/9 01/26/2019 01/23/2019 10/14/2018  Decreased Interest 0 0 0  Down, Depressed, Hopeless 0 0 0  PHQ - 2 Score 0 0 0  Altered sleeping - 0 -  Tired, decreased energy - 0 -  Change in  appetite - 0 -  Feeling bad or failure about yourself  - 0 -  Trouble concentrating - 0 -  Moving slowly or fidgety/restless - 0 -  Suicidal thoughts - 0 -  PHQ-9 Score - 0 -  Difficult doing work/chores - Not difficult at all -     Goals Addressed            This Visit's Progress   . Improve Hypertension and Diabetes Self Management.       Current Barriers:  . Chronic Disease Management support, education, and care coordination needs related to hypertension an diabetes.  Clinical Goals: . Over the next 90 days, patient will take all medications as prescribed. . Over the next 90 days, patient will attend all scheduled medical appointments . Over the next 90 days, patient will monitor and record BP readings. . Over the next 90 days, patient will continue to monitor blood glucose readings and maintain a log. . Over the next 90 days, patient will comply with recommended cardiac prudent and diabetic diets. .   Interventions related to HTN and DMII:  . Reviewed medications and indications for use. Patient encouraged to take all medications as prescribed. Encouraged to inform MD if unable to tolerate prescribed regimen. Encouraged to notify CCM team if medication assistance is needed. . Advised patient to monitor blood pressure daily and record readings. Provided education regarding BP parameters. Encouraged to notify MD with BP readings outside of established parameters. Reports an elevated reading of 150/90 prior to a recent dental appointment. Reports most readings have been within range. . Discussed s/sx of hypoglycemia and hyperglycemia along with recommended treatment interventions. Patient encouraged to monitor her blood glucose daily and maintain a log.  . Provided education regarding cardiac prudent and diabetic diets. Patient encouraged to adhere with recommended diet. . Reviewed recent and upcoming provider appointments. Patient encouraged to attend all scheduled provider visits  and telephonic outreach as scheduled to prevent delays in care. Encouraged to notify CCM team with concerns regarding transportation assistance if needed. Patient reports completing routine eye exam as scheduled on 12/25/18. Marland Kitchen Discussed plans for ongoing care management follow up. Provided direct contact information for care management team   Patient Self Care Activities:  . Self administers medications as prescribed . Attends scheduled provider appointments . Calls pharmacy for medication refills . Performs ADL's independently . Performs IADL's independently   Initial goal documentation          Ms. Qazi was given information about Chronic Care Management services including:  1. CCM service includes personalized support from designated clinical staff supervised by her physician, including individualized plan of care and coordination with other care providers 2. 24/7 contact phone numbers for assistance for urgent and routine care needs. 3. Service will only be billed when office clinical staff spend 20 minutes or more in a month to coordinate care. 4. Only  one practitioner may furnish and bill the service in a calendar month. 5. The patient may stop CCM services at any time (effective at the end of the month) by phone call to the office staff. 6. The patient will be responsible for cost sharing (co-pay) of up to 20% of the service fee (after annual deductible is met).  Patient agreed to services and verbal consent obtained.    PLAN The care management team will reach out to Mrs. Comunale next month. She was encouraged to contact the team with any health related concerns if needed prior to the scheduled outreach.   [facility] Care Management [phone]

## 2019-02-02 NOTE — Patient Instructions (Addendum)
Thank you for allowing the Chronic Care Management team to participate in your care.  Goals Addressed            This Visit's Progress   . Improve Hypertension and Diabetes Self Management.       Current Barriers:  . Chronic Disease Management support, education, and care coordination needs related to hypertension an diabetes.  Clinical Goals: . Over the next 90 days, patient will take all medications as prescribed. . Over the next 90 days, patient will attend all scheduled medical appointments . Over the next 90 days, patient will monitor and record BP readings. . Over the next 90 days, patient will continue to monitor blood glucose readings and maintain a log. . Over the next 90 days, patient will comply with recommended cardiac prudent and diabetic diets. .   Interventions related to HTN and DMII:  . Reviewed medications and indications for use. Patient encouraged to take all medications as prescribed. Encouraged to inform MD if unable to tolerate prescribed regimen. Encouraged to notify CCM team if medication assistance is needed. . Advised patient to monitor blood pressure daily and record readings. Provided education regarding BP parameters. Encouraged to notify MD with BP readings outside of established parameters. Reports an elevated reading of 150/90 prior to a recent dental appointment. Reports most readings have been within range. . Discussed s/sx of hypoglycemia and hyperglycemia along with recommended treatment interventions. Patient encouraged to monitor her blood glucose daily and maintain a log.  . Provided education regarding cardiac prudent and diabetic diets. Patient encouraged to adhere with recommended diet. . Reviewed recent and upcoming provider appointments. Patient encouraged to attend all scheduled provider visits and telephonic outreach as scheduled to prevent delays in care. Encouraged to notify CCM team with concerns regarding transportation assistance if needed.  Patient reports completing routine eye exam as scheduled on 12/25/18. Marland Kitchen Discussed plans for ongoing care management follow up. Provided direct contact information for care management team   Patient Self Care Activities:  . Self administers medications as prescribed . Attends scheduled provider appointments . Calls pharmacy for medication refills . Performs ADL's independently . Performs IADL's independently   Initial goal documentation          Mrs. Mirabito verbalized understanding of instructions provided during the telephonic outreach today. She declined need for a printed/mailed copy of patient instructions.   The care management team will follow up with Mrs. Hodgdon next month. She was encouraged to contact the team with any health related concerns if needed prior to the next outreach.   [facility] [phone]

## 2019-02-23 ENCOUNTER — Ambulatory Visit (INDEPENDENT_AMBULATORY_CARE_PROVIDER_SITE_OTHER): Payer: PPO

## 2019-02-23 ENCOUNTER — Other Ambulatory Visit: Payer: Self-pay | Admitting: Family Medicine

## 2019-02-23 ENCOUNTER — Other Ambulatory Visit: Payer: Self-pay

## 2019-02-23 VITALS — BP 128/76 | HR 81 | Temp 97.9°F | Resp 16

## 2019-02-23 DIAGNOSIS — E1169 Type 2 diabetes mellitus with other specified complication: Secondary | ICD-10-CM | POA: Diagnosis not present

## 2019-02-23 DIAGNOSIS — Z23 Encounter for immunization: Secondary | ICD-10-CM | POA: Diagnosis not present

## 2019-02-23 DIAGNOSIS — E669 Obesity, unspecified: Secondary | ICD-10-CM | POA: Diagnosis not present

## 2019-02-23 NOTE — Progress Notes (Signed)
Patient is here for a blood pressure check. Patient denies chest pain, palpitations, shortness of breath or visual disturbances. Today during nurse visit first check blood pressure was 140/76. After resting for 10 minutes it was 128/76 and heart rate was 81. She does take any blood pressure medications.

## 2019-02-24 LAB — COMPLETE METABOLIC PANEL WITH GFR
AG Ratio: 1.5 (calc) (ref 1.0–2.5)
ALT: 14 U/L (ref 6–29)
AST: 14 U/L (ref 10–35)
Albumin: 4.3 g/dL (ref 3.6–5.1)
Alkaline phosphatase (APISO): 51 U/L (ref 37–153)
BUN: 21 mg/dL (ref 7–25)
CO2: 30 mmol/L (ref 20–32)
Calcium: 9.8 mg/dL (ref 8.6–10.4)
Chloride: 105 mmol/L (ref 98–110)
Creat: 0.89 mg/dL (ref 0.60–0.93)
GFR, Est African American: 75 mL/min/{1.73_m2} (ref >=60)
GFR, Est Non African American: 65 mL/min/{1.73_m2} (ref >=60)
Globulin: 2.9 g/dL (calc) (ref 1.9–3.7)
Glucose, Bld: 107 mg/dL — ABNORMAL HIGH (ref 65–99)
Potassium: 4.9 mmol/L (ref 3.5–5.3)
Sodium: 141 mmol/L (ref 135–146)
Total Bilirubin: 0.4 mg/dL (ref 0.2–1.2)
Total Protein: 7.2 g/dL (ref 6.1–8.1)

## 2019-02-24 LAB — HEMOGLOBIN A1C
Hgb A1c MFr Bld: 6.2 % of total Hgb — ABNORMAL HIGH (ref <5.7)
Mean Plasma Glucose: 131 (calc)
eAG (mmol/L): 7.3 (calc)

## 2019-02-24 LAB — MICROALBUMIN / CREATININE URINE RATIO
Creatinine, Urine: 83 mg/dL (ref 20–275)
Microalb Creat Ratio: 4 mcg/mg creat (ref <30)
Microalb, Ur: 0.3 mg/dL

## 2019-03-02 ENCOUNTER — Ambulatory Visit (INDEPENDENT_AMBULATORY_CARE_PROVIDER_SITE_OTHER): Payer: PPO

## 2019-03-02 ENCOUNTER — Other Ambulatory Visit: Payer: Self-pay

## 2019-03-02 DIAGNOSIS — I10 Essential (primary) hypertension: Secondary | ICD-10-CM

## 2019-03-02 DIAGNOSIS — E119 Type 2 diabetes mellitus without complications: Secondary | ICD-10-CM

## 2019-03-02 NOTE — Chronic Care Management (AMB) (Signed)
Chronic Care Management   Follow Up Note   03/02/2019 Name: Stacy [name] MRN: [id number] DOB: 1945-08-09  Primary Care Provider: Steele Sizer, MD Reason for referral : Chronic Care Management   Stacy [name] is a 74 y.o. year old female who is a primary care patient of Steele Sizer, MD. She is currently engaged with the CCM team for assistance with chronic disease management and care coordination.   Stacy [name] reports not receiving the Covid-19 vaccination. She is interested in receiving the Wynetta Emery and [place] vaccine and prefers to wait until that option is available. She reports social distancing and following the recommended safety guidelines.   Review of her status, including review of consultants reports, relevant labs and test results was conducted today. Collaboration with appropriate care team members was performed as part of the comprehensive evaluation and provision of chronic care management services.    Outpatient Encounter Medications as of 03/02/2019  Medication Sig  . Cholecalciferol (VITAMIN D-3 PO) Take 1,000 Units by mouth daily.   . Cinnamon 500 MG capsule Take 1,000 mg by mouth daily.  Marland Kitchen FISH OIL-BORAGE-FLAX-SAFFLOWER PO Take 2 tablets by mouth daily.  . fluticasone (FLONASE) 50 MCG/ACT nasal spray Place 2 sprays into both nostrils daily. (Patient not taking: Reported on 01/23/2019)  . ketoconazole (NIZORAL) 2 % cream Apply 1 application topically daily. (Patient taking differently: Apply 1 application topically daily as needed. )  . loratadine (CLARITIN) 10 MG tablet Take 1 tablet (10 mg total) by mouth daily.  . Multiple Vitamins-Minerals (MULTIVITAMIN GUMMIES ADULT PO) Take by mouth daily.  Marland Kitchen olopatadine (PATANOL) 0.1 % ophthalmic solution 1 drop 2 (two) times daily.  Marland Kitchen POTASSIUM PO Take 1 tablet by mouth daily.   . rosuvastatin (CRESTOR) 5 MG tablet Take 1 tablet (5 mg total) by mouth 3 (three) times a week.  . triamterene-hydrochlorothiazide  (MAXZIDE-25) 37.5-25 MG tablet Take 1 tablet by mouth once daily   No facility-administered encounter medications on file as of 03/02/2019.     Objective:  BP Readings from Last 3 Encounters:  02/23/19 128/76  08/11/18 (!) 157/77  07/22/18 108/70    Lab Results  Component Value Date   HGBA1C 6.2 (H) 02/23/2019    Goals Addressed            This Visit's Progress   . Improve Hypertension and Diabetes Self Management.       Current Barriers:  . Chronic Disease Management support, education, and care coordination needs related to hypertension an diabetes.  Clinical Goals: . Over the next 90 days, patient will take all medications as prescribed. . Over the next 90 days, patient will attend all scheduled medical appointments . Over the next 90 days, patient will monitor and record BP readings. . Over the next 90 days, patient will continue to monitor blood glucose readings and maintain a log. . Over the next 90 days, patient will comply with recommended cardiac prudent and diabetic diets. .   Interventions related to HTN and DMII:  . Reviewed medications and indications for use. Patient encouraged to take all medications as prescribed. Encouraged to inform MD if unable to tolerate prescribed regimen. Encouraged to notify CCM team if medication assistance is needed. Patient denies current concerns regarding medication management or prescription costs. Discussed days that she is currently taking Crestor (prescribed for 3 days a week) Reports taking every Monday-Wednesday and Friday. . Advised patient to monitor blood pressure daily and record readings. Provided education regarding BP parameters. Encouraged to  notify MD with BP readings outside of established parameters. Patient reports not monitoring at home but reports reading during [facility] clinic visit last week was within range. Per Epic, BP reading was 128/76.  Marland Kitchen Discussed s/sx of hypoglycemia and hyperglycemia along with recommended  treatment interventions. Patient encouraged to monitor her blood glucose daily and maintain a log. Reviewed recent lab results from 02/23/19.Current HgbA1C is 6.2%. Patient reports not monitoring her blood glucose. Will follow up to determine if order for new glucometer and supplies is needed. . Provided education regarding cardiac prudent and diabetic diets. Patient encouraged to adhere with recommended diet. . Reviewed recent and upcoming provider appointments. Patient encouraged to attend all scheduled provider visits and telephonic outreach as scheduled to prevent delays in care. Encouraged to notify CCM team with concerns regarding transportation assistance if needed. Patient attended clinic visit for immunizations as scheduled on 02/23/19. Pending Podiatry appointment for routine foot care on 04/06/19. Marland Kitchen Discussed plans for ongoing care management follow up. Provided direct contact information for care management team   Patient Self Care Activities:  . Self administers medications as prescribed . Attends scheduled provider appointments . Calls pharmacy for medication refills . Performs ADL's independently . Performs IADL's independently   Please see past updates related to this goal by clicking on the "Past Updates" button in the selected goal          PLAN The care management team will reach out to Stacy [name] again next month.   [facility] [phone]

## 2019-03-02 NOTE — Patient Instructions (Signed)
Thank you for allowing the Chronic Care Management team to participate in your care.    Goals Addressed            This Visit's Progress   . Improve Hypertension and Diabetes Self Management.       Current Barriers:  . Chronic Disease Management support, education, and care coordination needs related to hypertension an diabetes.  Clinical Goals: . Over the next 90 days, patient will take all medications as prescribed. . Over the next 90 days, patient will attend all scheduled medical appointments . Over the next 90 days, patient will monitor and record BP readings. . Over the next 90 days, patient will continue to monitor blood glucose readings and maintain a log. . Over the next 90 days, patient will comply with recommended cardiac prudent and diabetic diets. .   Interventions related to HTN and DMII:  . Reviewed medications and indications for use. Patient encouraged to take all medications as prescribed. Encouraged to inform MD if unable to tolerate prescribed regimen. Encouraged to notify CCM team if medication assistance is needed. Patient denies current concerns regarding medication management or prescription costs. Discussed days that she is currently taking Crestor (prescribed for 3 days a week) Reports taking every Monday-Wednesday and Friday. . Advised patient to monitor blood pressure daily and record readings. Provided education regarding BP parameters. Encouraged to notify MD with BP readings outside of established parameters. Patient reports not monitoring at home but reports reading during [facility] clinic visit last week was within range. Per Epic, BP reading was 128/76.  Marland Kitchen Discussed s/sx of hypoglycemia and hyperglycemia along with recommended treatment interventions. Patient encouraged to monitor her blood glucose daily and maintain a log. Reviewed recent lab results from 02/23/19.Current HgbA1C is 6.2%. Patient reports not monitoring her blood glucose. Will follow up to determine  if order for new glucometer and supplies is needed. . Provided education regarding cardiac prudent and diabetic diets. Patient encouraged to adhere with recommended diet. . Reviewed recent and upcoming provider appointments. Patient encouraged to attend all scheduled provider visits and telephonic outreach as scheduled to prevent delays in care. Encouraged to notify CCM team with concerns regarding transportation assistance if needed. Patient attended clinic visit for immunizations as scheduled on 02/23/19. Pending Podiatry appointment for routine foot care on 04/06/19. Marland Kitchen Discussed plans for ongoing care management follow up. Provided direct contact information for care management team   Patient Self Care Activities:  . Self administers medications as prescribed . Attends scheduled provider appointments . Calls pharmacy for medication refills . Performs ADL's independently . Performs IADL's independently   Please see past updates related to this goal by clicking on the "Past Updates" button in the selected goal          Stacy [name] verbalized understanding of instructions provided during the telephonic outreach today. She declined need for a mailed/printed copy of the instructions.    The care management team will follow up with Mrs. Strahan next month.   [facility] [phone]

## 2019-04-06 ENCOUNTER — Encounter: Payer: Self-pay | Admitting: Podiatry

## 2019-04-06 ENCOUNTER — Ambulatory Visit (INDEPENDENT_AMBULATORY_CARE_PROVIDER_SITE_OTHER): Payer: PPO | Admitting: Podiatry

## 2019-04-06 ENCOUNTER — Other Ambulatory Visit: Payer: Self-pay

## 2019-04-06 VITALS — Temp 98.0°F

## 2019-04-06 DIAGNOSIS — M79675 Pain in left toe(s): Secondary | ICD-10-CM | POA: Diagnosis not present

## 2019-04-06 DIAGNOSIS — E119 Type 2 diabetes mellitus without complications: Secondary | ICD-10-CM

## 2019-04-06 DIAGNOSIS — B351 Tinea unguium: Secondary | ICD-10-CM

## 2019-04-06 DIAGNOSIS — M79674 Pain in right toe(s): Secondary | ICD-10-CM

## 2019-04-06 NOTE — Progress Notes (Signed)

## 2019-04-10 ENCOUNTER — Ambulatory Visit: Payer: Self-pay

## 2019-04-12 NOTE — Chronic Care Management (AMB) (Signed)
  Chronic Care Management   Note   Name: Stacy [name] MRN: [id number] DOB: Sep 03, 1945   Brief outreach with Stacy [name]. Reports doing well but being busy at the time of the call. She is agreeable to follow-up call within the next two weeks.   Follow up plan: The care management team will follow up on 04/20/19. Stacy [name] was encouraged to call if needed prior to the scheduled outreach.    [facility] [phone]

## 2019-04-20 ENCOUNTER — Ambulatory Visit (INDEPENDENT_AMBULATORY_CARE_PROVIDER_SITE_OTHER): Payer: PPO

## 2019-04-20 DIAGNOSIS — I10 Essential (primary) hypertension: Secondary | ICD-10-CM | POA: Diagnosis not present

## 2019-04-20 DIAGNOSIS — E1169 Type 2 diabetes mellitus with other specified complication: Secondary | ICD-10-CM

## 2019-04-20 DIAGNOSIS — E669 Obesity, unspecified: Secondary | ICD-10-CM | POA: Diagnosis not present

## 2019-04-20 NOTE — Chronic Care Management (AMB) (Signed)
Chronic Care Management   Follow Up Note   04/20/2019 Name: Mccartney Keel MRN: [id number] DOB: November 28, 1945  Primary Care Provider: Steele Sizer, MD Reason for referral : Chronic Care Management   Geniah Williston is a 74 y.o. year old female who is a primary care patient of Steele Sizer, MD. She is currently engaged with the Chronic care management team. A routine telephonic outreach was conducted today.  Mrs. Hosaka reports receiving the Covid-19 vaccination within the last two weeks.  Review of her status, including review of consultants reports, relevant labs and test results was conducted today. Collaboration with appropriate care team members was performed as part of the comprehensive evaluation and provision of chronic care management services.      Outpatient Encounter Medications as of 04/20/2019  Medication Sig  . Cholecalciferol (VITAMIN D-3 PO) Take 1,000 Units by mouth daily.   . Cinnamon 500 MG capsule Take 1,000 mg by mouth daily.  Marland Kitchen FISH OIL-BORAGE-FLAX-SAFFLOWER PO Take 2 tablets by mouth daily.  . fluticasone (FLONASE) 50 MCG/ACT nasal spray Place 2 sprays into both nostrils daily.  Marland Kitchen ketoconazole (NIZORAL) 2 % cream Apply 1 application topically daily. (Patient taking differently: Apply 1 application topically daily as needed. )  . loratadine (CLARITIN) 10 MG tablet Take 1 tablet (10 mg total) by mouth daily.  . Multiple Vitamins-Minerals (MULTIVITAMIN GUMMIES ADULT PO) Take by mouth daily.  Marland Kitchen olopatadine (PATANOL) 0.1 % ophthalmic solution 1 drop 2 (two) times daily.  Marland Kitchen POTASSIUM PO Take 1 tablet by mouth daily.   . rosuvastatin (CRESTOR) 5 MG tablet Take 1 tablet (5 mg total) by mouth 3 (three) times a week.  . triamterene-hydrochlorothiazide (MAXZIDE-25) 37.5-25 MG tablet Take 1 tablet by mouth once daily   No facility-administered encounter medications on file as of 04/20/2019.     Objective:  BP Readings from Last 3 Encounters:  02/23/19 128/76    08/11/18 (!) 157/77  07/22/18 108/70   Goals Addressed            This Visit's Progress   . Improve Hypertension and Diabetes Self Management.       Current Barriers:  . Chronic Disease Management support, education, and care coordination needs related to hypertension an diabetes.  Clinical Goals: . Over the next 90 days, patient will take all medications as prescribed. . Over the next 90 days, patient will attend all scheduled medical appointments . Over the next 90 days, patient will monitor and record BP readings. . Over the next 90 days, patient will continue to monitor blood glucose readings and maintain a log. . Over the next 90 days, patient will comply with recommended cardiac prudent and diabetic diets. .   Interventions related to HTN and DMII:  . Reviewed medications and indications for use. Patient encouraged to take all medications as prescribed. Encouraged to inform MD if unable to tolerate prescribed regimen. Encouraged to notify CCM team if medication assistance is needed.  . Advised patient to monitor blood pressure daily and record readings. Provided education regarding BP parameters. Encouraged to notify MD with BP readings outside of established parameters. Reports not monitoring at home. . Discussed s/sx of hypoglycemia and hyperglycemia along with recommended treatment interventions. Patient encouraged to monitor her blood glucose daily and maintain a log. Prefers not to obtain glucometer for home glucose monitoring. Very motivated to improve glucose control with diet and exercise.  Marland Kitchen Provided education regarding cardiac prudent and diabetic diets. Patient encouraged to adhere with recommended diet. Reports  improved compliance with recommended diet.  . Reviewed recent and upcoming provider appointments. Patient encouraged to attend all scheduled provider visits and telephonic outreach as scheduled to prevent delays in care. Encouraged to notify CCM team with concerns  regarding transportation assistance if needed.  . Discussed plans for ongoing care management follow up. Provided direct contact information for care management team   Patient Self Care Activities:  . Self administers medications as prescribed . Attends scheduled provider appointments . Calls pharmacy for medication refills . Performs ADL's independently . Performs IADL's independently   Please see past updates related to this goal by clicking on the "Past Updates" button in the selected goal          PLAN The care management team will follow up with Mrs. Meyering next month. She was encouraged to call with any health related concerns if needed prior to the next scheduled outreach.   [facility] [phone]

## 2019-04-20 NOTE — Patient Instructions (Signed)
Thank you for allowing the Chronic Care Management team to participate in your care.  Goals Addressed            This Visit's Progress   . Improve Hypertension and Diabetes Self Management.       Current Barriers:  . Chronic Disease Management support, education, and care coordination needs related to hypertension an diabetes.  Clinical Goals: . Over the next 90 days, patient will take all medications as prescribed. . Over the next 90 days, patient will attend all scheduled medical appointments . Over the next 90 days, patient will monitor and record BP readings. . Over the next 90 days, patient will continue to monitor blood glucose readings and maintain a log. . Over the next 90 days, patient will comply with recommended cardiac prudent and diabetic diets. .   Interventions related to HTN and DMII:  . Reviewed medications and indications for use. Patient encouraged to take all medications as prescribed. Encouraged to inform MD if unable to tolerate prescribed regimen. Encouraged to notify CCM team if medication assistance is needed.  . Advised patient to monitor blood pressure daily and record readings. Provided education regarding BP parameters. Encouraged to notify MD with BP readings outside of established parameters. Reports not monitoring at home. . Discussed s/sx of hypoglycemia and hyperglycemia along with recommended treatment interventions. Patient encouraged to monitor her blood glucose daily and maintain a log. Prefers not to obtain glucometer for home glucose monitoring. Very motivated to improve glucose control with diet and exercise.  Marland Kitchen Provided education regarding cardiac prudent and diabetic diets. Patient encouraged to adhere with recommended diet. Reports improved compliance with recommended diet.  . Reviewed recent and upcoming provider appointments. Patient encouraged to attend all scheduled provider visits and telephonic outreach as scheduled to prevent delays in care.  Encouraged to notify CCM team with concerns regarding transportation assistance if needed.  . Discussed plans for ongoing care management follow up. Provided direct contact information for care management team   Patient Self Care Activities:  . Self administers medications as prescribed . Attends scheduled provider appointments . Calls pharmacy for medication refills . Performs ADL's independently . Performs IADL's independently   Please see past updates related to this goal by clicking on the "Past Updates" button in the selected goal         Stacy [name] verbalized understanding of the instructions provided during the telephonic outreach today. She declined need for a mailed/printed copy of the instructions.   The care management team will follow up with [name] next month. She was encouraged to call with any health related concerns if needed prior to the next scheduled outreach.   [facility] [phone]

## 2019-05-11 ENCOUNTER — Other Ambulatory Visit: Payer: Self-pay | Admitting: Family Medicine

## 2019-05-11 DIAGNOSIS — I10 Essential (primary) hypertension: Secondary | ICD-10-CM

## 2019-05-18 ENCOUNTER — Telehealth: Payer: Self-pay

## 2019-05-20 ENCOUNTER — Telehealth: Payer: Self-pay

## 2019-05-27 ENCOUNTER — Ambulatory Visit: Payer: Self-pay

## 2019-05-27 ENCOUNTER — Other Ambulatory Visit: Payer: Self-pay

## 2019-05-27 ENCOUNTER — Ambulatory Visit: Payer: PPO | Admitting: Pharmacist

## 2019-05-27 DIAGNOSIS — E1169 Type 2 diabetes mellitus with other specified complication: Secondary | ICD-10-CM

## 2019-05-27 DIAGNOSIS — E785 Hyperlipidemia, unspecified: Secondary | ICD-10-CM

## 2019-05-27 DIAGNOSIS — I10 Essential (primary) hypertension: Secondary | ICD-10-CM

## 2019-05-27 DIAGNOSIS — E119 Type 2 diabetes mellitus without complications: Secondary | ICD-10-CM

## 2019-05-27 NOTE — Patient Instructions (Addendum)
Visit Information  Goals Addressed            This Visit's Progress   . Diabetes - goal A1c < 7%       CARE PLAN ENTRY (see longitudinal plan of care for additional care plan information)  Current Barriers:  . Diabetes: type 2; complicated by chronic medical conditions including hyperlipidemia Lab Results  Component Value Date   HGBA1C 6.2 (H) 02/23/2019 .   Lab Results  Component Value Date   CREATININE 0.89 02/23/2019   CREATININE 0.77 07/22/2018   CREATININE 0.83 10/11/2017 .   Marland Kitchen No results found for: EGFR > 60 ml/min . Current antihyperglycemic regimen: none . Denies hypoglycemic symptoms, including dizziness, lightheadedness, shaking, sweating . Denies hyperglycemic symptoms, including polyuria, polydipsia, polyphagia, nocturia, blurred vision, neuropathy . Current meal patterns: balanced diet, avoids starchy foods . Current exercise: NA . Current blood glucose readings: doesn't check  Pharmacist Clinical Goal(s):  Marland Kitchen Over the next 90 days, patient will work with PharmD and primary care provider to address lifestyle control type 2 diabetes to ensure no medication needed  Interventions: . Comprehensive medication review performed, medication list updated in electronic medical record . Inter-disciplinary care team collaboration (see longitudinal plan of care) . Counseled on goal and reason for no ACE/ARB (angioedema)  Patient Self Care Activities:  . Patient will contact provider with any episodes of hypoglycemia . Patient will report any questions or concerns to provider or clinical pharmacist  Initial goal documentation     . Medication Management - optimize medication list        CARE PLAN ENTRY (see longitudinal plan of care for additional care plan information)  Current Barriers:  . Complex patient with multiple comorbidities including diabetes, hyperlipidemia . Self-manages medications by container Does not use a pill box or other adherence  strategies   Pharmacist Clinical Goal(s):  Marland Kitchen Over the next 90 days, patient will work with PharmD and provider towards optimized medication management . Keep medication list updated and trim OTC products to minimum  Interventions: . Comprehensive medication review performed; medication list updated in electronic medical record . Inter-disciplinary care team collaboration (see longitudinal plan of care)  Patient Self Care Activities:  . Patient will take medications as prescribed . Patient will focus on improved adherence by reducing over-the-counter medication use  Initial goal documentation        Ms. Ellinger was given information about Chronic Care Management services today including:  1. CCM service includes personalized support from designated clinical staff supervised by her physician, including individualized plan of care and coordination with other care providers 2. 24/7 contact phone numbers for assistance for urgent and routine care needs. 3. Standard insurance, coinsurance, copays and deductibles apply for chronic care management only during months in which we provide at least 20 minutes of these services. Most insurances cover these services at 100%, however patients may be responsible for any copay, coinsurance and/or deductible if applicable. This service may help you avoid the need for more expensive face-to-face services. 4. Only one practitioner may furnish and bill the service in a calendar month. 5. The patient may stop CCM services at any time (effective at the end of the month) by phone call to the office staff.  Patient agreed to services and verbal consent obtained.   Print copy of patient instructions provided.  Telephone follow up appointment with pharmacy team member scheduled for: 1 month  Milus Height, PharmD, [place], [facility] [phone]  Dyslipidemia Dyslipidemia is an imbalance of waxy, fat-like substances  (lipids) in the blood. The body needs lipids in small amounts. Dyslipidemia often involves a high level of cholesterol or triglycerides, which are types of lipids. Common forms of dyslipidemia include:  High levels of LDL cholesterol. LDL is the type of cholesterol that causes fatty deposits (plaques) to build up in the blood vessels that carry blood away from your heart (arteries).  Low levels of HDL cholesterol. HDL cholesterol is the type of cholesterol that protects against heart disease. High levels of HDL remove the LDL buildup from arteries.  High levels of triglycerides. Triglycerides are a fatty substance in the blood that is linked to a buildup of plaques in the arteries. What are the causes? Primary dyslipidemia is caused by changes (mutations) in genes that are passed down through families (inherited). These mutations cause several types of dyslipidemia. Secondary dyslipidemia is caused by lifestyle choices and diseases that lead to dyslipidemia, such as:  Eating a diet that is high in animal fat.  Not getting enough exercise.  Having diabetes, kidney disease, liver disease, or thyroid disease.  Drinking large amounts of alcohol.  Using certain medicines. What increases the risk? You are more likely to develop this condition if you are an older man or if you are a woman who has gone through menopause. Other risk factors include:  Having a family history of dyslipidemia.  Taking certain medicines, including birth control pills, steroids, some diuretics, and beta-blockers.  Smoking cigarettes.  Eating a high-fat diet.  Having certain medical conditions such as diabetes, polycystic ovary syndrome (PCOS), kidney disease, liver disease, or hypothyroidism.  Not exercising regularly.  Being overweight or obese with too much belly fat. What are the signs or symptoms? In most cases, dyslipidemia does not usually cause any symptoms. In severe cases, very high lipid levels can  cause:  Fatty bumps under the skin (xanthomas).  White or gray ring around the black center (pupil) of the eye. Very high triglyceride levels can cause inflammation of the pancreas (pancreatitis). How is this diagnosed? Your health care provider may diagnose dyslipidemia based on a routine blood test (fasting blood test). Because most people do not have symptoms of the condition, this blood testing (lipid profile) is done on adults age 81 and older and is repeated every 5 years. This test checks:  Total cholesterol. This measures the total amount of cholesterol in your blood, including LDL cholesterol, HDL cholesterol, and triglycerides. A healthy number is below 200.  LDL cholesterol. The target number for LDL cholesterol is different for each person, depending on individual risk factors. Ask your health care provider what your LDL cholesterol should be.  HDL cholesterol. An HDL level of 60 or higher is best because it helps to protect against heart disease. A number below 65 for men or below 78 for women increases the risk for heart disease.  Triglycerides. A healthy triglyceride number is below 150. If your lipid profile is abnormal, your health care provider may do other blood tests. How is this treated? Treatment depends on the type of dyslipidemia that you have and your other risk factors for heart disease and stroke. Your health care provider will have a target range for your lipid levels based on this information. For many people, this condition may be treated by lifestyle changes, such as diet and exercise. Your health care provider may recommend that you:  Get regular exercise.  Make changes to your diet.  Quit smoking if  you smoke. If diet changes and exercise do not help you reach your goals, your health care provider may also prescribe medicine to lower lipids. The most commonly prescribed type of medicine lowers your LDL cholesterol (statin drug). If you have a high  triglyceride level, your provider may prescribe another type of drug (fibrate) or an omega-3 fish oil supplement, or both. Follow these instructions at home:  Eating and drinking  Follow instructions from your health care provider or dietitian about eating or drinking restrictions.  Eat a healthy diet as told by your health care provider. This can help you reach and maintain a healthy weight, lower your LDL cholesterol, and raise your HDL cholesterol. This may include: ? Limiting your calories, if you are overweight. ? Eating more fruits, vegetables, whole grains, fish, and lean meats. ? Limiting saturated fat, trans fat, and cholesterol.  If you drink alcohol: ? Limit how much you use. ? Be aware of how much alcohol is in your drink. In the [place] one drink equals one 12 oz bottle of beer (355 mL), one 5 oz glass of wine (148 mL), or one 1 oz glass of hard liquor (44 mL).  Do not drink alcohol if: ? Your health care provider tells you not to drink. ? You are pregnant, may be pregnant, or are planning to become pregnant. Activity  Get regular exercise. Start an exercise and strength training program as told by your health care provider. Ask your health care provider what activities are safe for you. Your health care provider may recommend: ? 30 minutes of aerobic activity 4-6 days a week. Brisk walking is an example of aerobic activity. ? Strength training 2 days a week. General instructions  Do not use any products that contain nicotine or tobacco, such as cigarettes, e-cigarettes, and chewing tobacco. If you need help quitting, ask your health care provider.  Take over-the-counter and prescription medicines only as told by your health care provider. This includes supplements.  Keep all follow-up visits as told by your health care provider. Contact a health care provider if:  You are: ? Having trouble sticking to your exercise or diet plan. ? Struggling to quit smoking or control  your use of alcohol. Summary  Dyslipidemia often involves a high level of cholesterol or triglycerides, which are types of lipids.  Treatment depends on the type of dyslipidemia that you have and your other risk factors for heart disease and stroke.  For many people, treatment starts with lifestyle changes, such as diet and exercise.  Your health care provider may prescribe medicine to lower lipids. This information is not intended to replace advice given to you by your health care provider. Make sure you discuss any questions you have with your health care provider. Document Revised: 08/26/2017 Document Reviewed: 08/02/2017 Elsevier Patient Education  [employer].

## 2019-05-27 NOTE — Chronic Care Management (AMB) (Signed)
Chronic Care Management Pharmacy  Name: Stacy [name]  MRN: [id number] DOB: 01/19/1945  Chief Complaint/ HPI  Stacy [name],  74 y.o. , female presents for their Initial CCM visit with the clinical pharmacist via telephone due to COVID-19 Pandemic.  PCP : Steele Sizer, MD  Their chronic conditions include: DM, HTN  Office Visits: 1/8 Wt 198 BMI 31.01, Crestor MWF, diabetic diet  Consult Visit: NA  Medications: Outpatient Encounter Medications as of 05/27/2019  Medication Sig  . Cholecalciferol (VITAMIN D-3 PO) Take 1,000 Units by mouth daily.   . Cinnamon 500 MG capsule Take 1,000 mg by mouth daily.  Marland Kitchen FISH OIL-BORAGE-FLAX-SAFFLOWER PO Take 2 tablets by mouth daily.  Marland Kitchen ketoconazole (NIZORAL) 2 % cream Apply 1 application topically daily. (Patient taking differently: Apply 1 application topically daily as needed. )  . loratadine (CLARITIN) 10 MG tablet Take 1 tablet (10 mg total) by mouth daily.  Marland Kitchen olopatadine (PATANOL) 0.1 % ophthalmic solution 1 drop 2 (two) times daily.  Marland Kitchen POTASSIUM PO Take 1 tablet by mouth daily.   Marland Kitchen triamterene-hydrochlorothiazide (MAXZIDE-25) 37.5-25 MG tablet Take 1 tablet by mouth once daily  . fluticasone (FLONASE) 50 MCG/ACT nasal spray Place 2 sprays into both nostrils daily. (Patient not taking: Reported on 05/27/2019)  . Multiple Vitamins-Minerals (MULTIVITAMIN GUMMIES ADULT PO) Take by mouth daily.  . rosuvastatin (CRESTOR) 5 MG tablet Take 1 tablet (5 mg total) by mouth 3 (three) times a week. (Patient not taking: Reported on 05/27/2019)   No facility-administered encounter medications on file as of 05/27/2019.     Stress: Stress Concern Present  . Feeling of Stress : Rather much    Current Diagnosis/Assessment:  Goals Addressed            This Visit's Progress   . Diabetes - goal A1c < 7%       CARE PLAN ENTRY (see longitudinal plan of care for additional care plan information)  Current Barriers:  . Diabetes: type 2;  complicated by chronic medical conditions including hyperlipidemia Lab Results  Component Value Date   HGBA1C 6.2 (H) 02/23/2019 .   Lab Results  Component Value Date   CREATININE 0.89 02/23/2019   CREATININE 0.77 07/22/2018   CREATININE 0.83 10/11/2017 .   Marland Kitchen No results found for: EGFR > 60 ml/min . Current antihyperglycemic regimen: none . Denies hypoglycemic symptoms, including dizziness, lightheadedness, shaking, sweating . Denies hyperglycemic symptoms, including polyuria, polydipsia, polyphagia, nocturia, blurred vision, neuropathy . Current meal patterns: balanced diet, avoids starchy foods . Current exercise: NA . Current blood glucose readings: doesn't check  Pharmacist Clinical Goal(s):  Marland Kitchen Over the next 90 days, patient will work with PharmD and primary care provider to address lifestyle control type 2 diabetes to ensure no medication needed  Interventions: . Comprehensive medication review performed, medication list updated in electronic medical record . Inter-disciplinary care team collaboration (see longitudinal plan of care) . Counseled on goal and reason for no ACE/ARB (angioedema)  Patient Self Care Activities:  . Patient will contact provider with any episodes of hypoglycemia . Patient will report any questions or concerns to provider or clinical pharmacist  Initial goal documentation     . Medication Management - optimize medication list        CARE PLAN ENTRY (see longitudinal plan of care for additional care plan information)  Current Barriers:  . Complex patient with multiple comorbidities including diabetes, hyperlipidemia . Self-manages medications by container Does not use a pill box or other  adherence strategies   Pharmacist Clinical Goal(s):  Marland Kitchen Over the next 90 days, patient will work with PharmD and provider towards optimized medication management . Keep medication list updated and trim OTC products to minimum  Interventions: . Comprehensive  medication review performed; medication list updated in electronic medical record . Inter-disciplinary care team collaboration (see longitudinal plan of care)  Patient Self Care Activities:  . Patient will take medications as prescribed . Patient will focus on improved adherence by reducing over-the-counter medication use  Initial goal documentation       Diabetes   Recent Relevant Labs: Lab Results  Component Value Date/Time   HGBA1C 6.2 (H) 02/23/2019 09:47 AM   HGBA1C 6.1 (A) 07/22/2018 11:10 AM   HGBA1C 6.3 (H) 10/11/2017 10:58 AM   MICROALBUR 0.3 02/23/2019 09:47 AM   MICROALBUR 0.2 10/11/2017 10:58 AM   MICROALBUR Neg 10/11/2016 08:28 AM   MICROALBUR negative 10/10/2015 08:39 AM     Checking BG: Rarely  Recent FBG Readings: < 120 Patient has failed these meds in past: NA Patient is currently controlled on the following medications: None  Last diabetic Foot exam:  Lab Results  Component Value Date/Time   HMDIABEYEEXA No Retinopathy 12/29/2018 12:00 AM    Last diabetic Eye exam: No results found for: HMDIABFOOTEX   We discussed:   Eats balanced diet, watches starch  Says never on ACEI, but has ramipril allergy. Lips swelling. Counseled on goal  Plan  Continue current medications  Hyperlipidemia   Lipid Panel     Component Value Date/Time   CHOL 162 07/22/2018 1113   CHOL 174 10/07/2014 1013   TRIG 62 07/22/2018 1113   HDL 70 07/22/2018 1113   HDL 83 10/07/2014 1013   CHOLHDL 2.3 07/22/2018 1113   VLDL 15 10/10/2015 0943   LDLCALC 78 07/22/2018 1113   LABVLDL 15 10/07/2014 1013     The 10-year ASCVD risk score Mikey Bussing DC Jr., et al., 2013) is: 25.5%   Values used to calculate the score:     Age: 52 years     Sex: Female     Is Non-Hispanic African American: Yes     Diabetic: Yes     Tobacco smoker: No     Systolic Blood Pressure: 366 mmHg     Is BP treated: Yes     HDL Cholesterol: 70 mg/dL     Total Cholesterol: 162 mg/dL   Patient has  failed these meds in past: None Patient is currently controlled on the following medications: Crestor  We discussed:  Crestor tiwk Having trouble timing it Haven't measured lipids since starting Crestor No problems with statins before, counseled this is for DM Not taking now  Plan  Continue current medications   Medication Management   Pt uses [facility] for all medications Uses pill box? No - 2 prescriptions Pt endorses 100% compliance  We discussed:  Potential benefits of [facility] including home delivery and pill pack, however the patient is only taking 2 prescription medications and doesn't want her OTC packaged.  Said only on one medication at the start of the call Takes Claritin PRN not daily Takes 72m ASA not in list Vit D3 5000u daily Cinnamon 10023mone daily Takes 1 fish oil daily Flonase PRN Nizoral rarely PRN Patanol last used 2 weeks ago KCl 9945maily  Needs Crestor refill  Plan  Continue current medication management strategy  Follow up: 3 month phone visit  TedMilus HeightharmD, BCGP, [facility]TT[facility]  [facility] [phone]

## 2019-05-28 ENCOUNTER — Other Ambulatory Visit: Payer: Self-pay | Admitting: Family Medicine

## 2019-05-28 MED ORDER — ROSUVASTATIN CALCIUM 5 MG PO TABS: 5.0000 mg | ORAL_TABLET | ORAL | 1 refills | Status: DC

## 2019-05-29 NOTE — Patient Instructions (Signed)
Thank you for allowing the Chronic Care Management team to participate in your care.  Goals Addressed            This Visit's Progress   . Improve Hypertension and Diabetes Self Management.       Current Barriers:  . Chronic Disease Management support, education, and care coordination needs related to hypertension an diabetes.  Clinical Goals: . Over the next 90 days, patient will take all medications as prescribed. . Over the next 90 days, patient will attend all scheduled medical appointments . Over the next 90 days, patient will monitor and record BP readings. . Over the next 90 days, patient will continue to monitor blood glucose readings and maintain a log. . Over the next 90 days, patient will comply with recommended cardiac prudent and diabetic diets. .   Interventions related to HTN and DMII:  . Discussed adherence with medications. Reports taking all medications as prescribed. Denies new concerns regarding prescription costs or ability to self-manage medications. . Discussed compliance with providers recommendations for managing blood pressure and blood glucose. She does not monitor her blood glucose or consistently monitor her BP at home but reports compliance with recommended cardiac prudent/diabetic diet, medications and activity.  . Reviewed schedule.  Encouraged to continue attending medical appointments as scheduled to prevent delays in care. . Discussed plans for ongoing care management follow up. Pending outreach with the CCM Pharmacist later today. Agreeable to outreach with the  CCM Nurse Case Manager within the next two months.   Patient Self Care Activities:  . Self administers medications as prescribed . Attends scheduled provider appointments . Calls pharmacy for medication refills . Performs ADL's independently . Performs IADL's independently   Please see past updates related to this goal by clicking on the "Past Updates" button in the selected goal          Stacy [name] verbalized understanding of the instructions provided during the brief telephonic outreach today. Declined need for a mailed/printed copy of the instructions.   The care management team will follow up with Stacy [name] within the next two months.   [facility] Care Management [phone]

## 2019-05-29 NOTE — Chronic Care Management (AMB) (Signed)
Chronic Care Management   Follow Up Note    Name: Stacy [name] MRN: [id number] DOB: 1945/10/20  Primary Care Provider: Steele Sizer, MD Reason for referral : Chronic Care Management   Stacy [name] is a 74 y.o. year old female who is a primary care patient of Steele Sizer, MD. She is currently engaged with the chronic care management team. A brief telephonic outreach was conducted today.  Review of Stacy [name] status, including review of consultants reports, relevant labs and test results was conducted today. Collaboration with appropriate care team members was performed as part of the comprehensive evaluation and provision of chronic care management services.    SDOH (Social Determinants of Health) assessments performed: No     Outpatient Encounter Medications as of 05/27/2019  Medication Sig  . Cholecalciferol (VITAMIN D-3 PO) Take 1,000 Units by mouth daily.   . Cinnamon 500 MG capsule Take 1,000 mg by mouth daily.  Marland Kitchen FISH OIL-BORAGE-FLAX-SAFFLOWER PO Take 2 tablets by mouth daily.  . fluticasone (FLONASE) 50 MCG/ACT nasal spray Place 2 sprays into both nostrils daily. (Patient not taking: Reported on 05/27/2019)  . ketoconazole (NIZORAL) 2 % cream Apply 1 application topically daily. (Patient taking differently: Apply 1 application topically daily as needed. )  . loratadine (CLARITIN) 10 MG tablet Take 1 tablet (10 mg total) by mouth daily.  . Multiple Vitamins-Minerals (MULTIVITAMIN GUMMIES ADULT PO) Take by mouth daily.  Marland Kitchen olopatadine (PATANOL) 0.1 % ophthalmic solution 1 drop 2 (two) times daily.  Marland Kitchen POTASSIUM PO Take 1 tablet by mouth daily.   Marland Kitchen triamterene-hydrochlorothiazide (MAXZIDE-25) 37.5-25 MG tablet Take 1 tablet by mouth once daily  . [DISCONTINUED] rosuvastatin (CRESTOR) 5 MG tablet Take 1 tablet (5 mg total) by mouth 3 (three) times a week. (Patient not taking: Reported on 05/27/2019)   No facility-administered encounter medications on file as of  05/27/2019.      Goals Addressed            This Visit's Progress   . Improve Hypertension and Diabetes Self Management.       Current Barriers:  . Chronic Disease Management support, education, and care coordination needs related to hypertension an diabetes.  Clinical Goals: . Over the next 90 days, patient will take all medications as prescribed. . Over the next 90 days, patient will attend all scheduled medical appointments . Over the next 90 days, patient will monitor and record BP readings. . Over the next 90 days, patient will continue to monitor blood glucose readings and maintain a log. . Over the next 90 days, patient will comply with recommended cardiac prudent and diabetic diets. .   Interventions related to HTN and DMII:  . Discussed adherence with medications. Reports taking all medications as prescribed. Denies new concerns regarding prescription costs or ability to self-manage medications. . Discussed compliance with providers recommendations for managing blood pressure and blood glucose. She does not monitor her blood glucose or consistently monitor her BP at home but reports compliance with recommended cardiac prudent/diabetic diet, medications and activity.  . Reviewed schedule.  Encouraged to continue attending medical appointments as scheduled to prevent delays in care. . Discussed plans for ongoing care management follow up. Pending outreach with the CCM Pharmacist later today. Agreeable to outreach with the  CCM Nurse Case Manager within the next two months.   Patient Self Care Activities:  . Self administers medications as prescribed . Attends scheduled provider appointments . Calls pharmacy for medication refills . Performs ADL's independently .  Performs IADL's independently   Please see past updates related to this goal by clicking on the "Past Updates" button in the selected goal          PLAN The care management team will follow-up with Stacy [name]  within the next two months.   [facility] Care [facility] [phone]

## 2019-06-01 ENCOUNTER — Telehealth: Payer: Self-pay | Admitting: Family Medicine

## 2019-06-01 ENCOUNTER — Other Ambulatory Visit: Payer: Self-pay | Admitting: Family Medicine

## 2019-06-01 MED ORDER — ROSUVASTATIN CALCIUM 5 MG PO TABS: 5.0000 mg | ORAL_TABLET | ORAL | 0 refills | Status: DC

## 2019-06-01 NOTE — Telephone Encounter (Signed)
Pt wants to know if Dr Ancil Boozer wants her to continue to take the rosuvastatin (CRESTOR) 5 MG tablet  Pt states she never heard back when she had lab work last time, and Dr was going to let her know should she continue this med?  Pt doesn't like what she is reading about this med, and if she doesn't need, she prefers to stop.  AND if she does need to continue this med, she prefers a 90 day Rx.   Pleas call back to advise.

## 2019-06-02 NOTE — Telephone Encounter (Signed)
If her cholesterol is better when she comes in for recheck she does not want to continue to take it.  She just picked up a prescription for Crestor for 12 tablets. She is upset about having to pay again for 48 tablets. She said this medication can effect her blood sugar. Should she be taking this medication if it is going to effect something else.

## 2019-06-18 ENCOUNTER — Other Ambulatory Visit: Payer: Self-pay

## 2019-06-18 DIAGNOSIS — Z1231 Encounter for screening mammogram for malignant neoplasm of breast: Secondary | ICD-10-CM

## 2019-07-01 ENCOUNTER — Other Ambulatory Visit: Payer: Self-pay

## 2019-07-01 DIAGNOSIS — I10 Essential (primary) hypertension: Secondary | ICD-10-CM

## 2019-07-01 MED ORDER — TRIAMTERENE-HCTZ 37.5-25 MG PO TABS: 1.0000 | ORAL_TABLET | Freq: Every day | ORAL | 1 refills | Status: DC

## 2019-07-02 ENCOUNTER — Other Ambulatory Visit: Payer: Self-pay | Admitting: Family Medicine

## 2019-07-02 DIAGNOSIS — I10 Essential (primary) hypertension: Secondary | ICD-10-CM

## 2019-07-09 ENCOUNTER — Encounter: Payer: Self-pay | Admitting: Podiatry

## 2019-07-09 ENCOUNTER — Ambulatory Visit (INDEPENDENT_AMBULATORY_CARE_PROVIDER_SITE_OTHER): Payer: Medicare PPO | Admitting: Podiatry

## 2019-07-09 ENCOUNTER — Other Ambulatory Visit: Payer: Self-pay

## 2019-07-09 DIAGNOSIS — M79674 Pain in right toe(s): Secondary | ICD-10-CM

## 2019-07-09 DIAGNOSIS — E119 Type 2 diabetes mellitus without complications: Secondary | ICD-10-CM | POA: Diagnosis not present

## 2019-07-09 DIAGNOSIS — B351 Tinea unguium: Secondary | ICD-10-CM | POA: Diagnosis not present

## 2019-07-09 DIAGNOSIS — M79675 Pain in left toe(s): Secondary | ICD-10-CM | POA: Diagnosis not present

## 2019-07-09 NOTE — Progress Notes (Signed)

## 2019-07-22 ENCOUNTER — Other Ambulatory Visit: Payer: Self-pay

## 2019-07-22 ENCOUNTER — Encounter: Payer: Self-pay | Admitting: Family Medicine

## 2019-07-22 ENCOUNTER — Other Ambulatory Visit: Payer: Self-pay | Admitting: Family Medicine

## 2019-07-22 ENCOUNTER — Ambulatory Visit (INDEPENDENT_AMBULATORY_CARE_PROVIDER_SITE_OTHER): Payer: Medicare PPO | Admitting: Family Medicine

## 2019-07-22 VITALS — BP 132/70 | HR 92 | Temp 97.8°F | Resp 16 | Ht 67.0 in | Wt 201.0 lb

## 2019-07-22 DIAGNOSIS — E1169 Type 2 diabetes mellitus with other specified complication: Secondary | ICD-10-CM | POA: Diagnosis not present

## 2019-07-22 DIAGNOSIS — D72819 Decreased white blood cell count, unspecified: Secondary | ICD-10-CM

## 2019-07-22 DIAGNOSIS — J302 Other seasonal allergic rhinitis: Secondary | ICD-10-CM

## 2019-07-22 DIAGNOSIS — E669 Obesity, unspecified: Secondary | ICD-10-CM | POA: Diagnosis not present

## 2019-07-22 DIAGNOSIS — E785 Hyperlipidemia, unspecified: Secondary | ICD-10-CM

## 2019-07-22 DIAGNOSIS — I10 Essential (primary) hypertension: Secondary | ICD-10-CM

## 2019-07-22 DIAGNOSIS — E1159 Type 2 diabetes mellitus with other circulatory complications: Secondary | ICD-10-CM

## 2019-07-22 DIAGNOSIS — E559 Vitamin D deficiency, unspecified: Secondary | ICD-10-CM | POA: Diagnosis not present

## 2019-07-22 DIAGNOSIS — I152 Hypertension secondary to endocrine disorders: Secondary | ICD-10-CM

## 2019-07-22 LAB — CBC WITH DIFFERENTIAL/PLATELET
Absolute Monocytes: 451 cells/uL (ref 200–950)
Basophils Absolute: 30 cells/uL (ref 0–200)
Basophils Relative: 0.8 %
Eosinophils Absolute: 211 cells/uL (ref 15–500)
Eosinophils Relative: 5.7 %
HCT: 40.1 % (ref 35.0–45.0)
Hemoglobin: 12.9 g/dL (ref 11.7–15.5)
Lymphs Abs: 1473 cells/uL (ref 850–3900)
MCH: 28.4 pg (ref 27.0–33.0)
MCHC: 32.2 g/dL (ref 32.0–36.0)
MCV: 88.3 fL (ref 80.0–100.0)
MPV: 10.5 fL (ref 7.5–12.5)
Monocytes Relative: 12.2 %
Neutro Abs: 1536 cells/uL (ref 1500–7800)
Neutrophils Relative %: 41.5 %
Platelets: 258 10*3/uL (ref 140–400)
RBC: 4.54 10*6/uL (ref 3.80–5.10)
RDW: 12.9 % (ref 11.0–15.0)
Total Lymphocyte: 39.8 %
WBC: 3.7 10*3/uL — ABNORMAL LOW (ref 3.8–10.8)

## 2019-07-22 LAB — COMPLETE METABOLIC PANEL WITH GFR
AG Ratio: 1.4 (calc) (ref 1.0–2.5)
ALT: 19 U/L (ref 6–29)
AST: 17 U/L (ref 10–35)
Albumin: 4.3 g/dL (ref 3.6–5.1)
Alkaline phosphatase (APISO): 53 U/L (ref 37–153)
BUN: 13 mg/dL (ref 7–25)
CO2: 34 mmol/L — ABNORMAL HIGH (ref 20–32)
Calcium: 10.3 mg/dL (ref 8.6–10.4)
Chloride: 103 mmol/L (ref 98–110)
Creat: 0.9 mg/dL (ref 0.60–0.93)
GFR, Est African American: 74 mL/min/{1.73_m2} (ref >=60)
GFR, Est Non African American: 64 mL/min/{1.73_m2} (ref >=60)
Globulin: 3.1 g/dL (calc) (ref 1.9–3.7)
Glucose, Bld: 101 mg/dL — ABNORMAL HIGH (ref 65–99)
Potassium: 5.7 mmol/L — ABNORMAL HIGH (ref 3.5–5.3)
Sodium: 141 mmol/L (ref 135–146)
Total Bilirubin: 0.4 mg/dL (ref 0.2–1.2)
Total Protein: 7.4 g/dL (ref 6.1–8.1)

## 2019-07-22 LAB — LIPID PANEL
Cholesterol: 137 mg/dL (ref <200)
HDL: 75 mg/dL (ref >=50)
LDL Cholesterol (Calc): 48 mg/dL (calc)
Non-HDL Cholesterol (Calc): 62 mg/dL (calc) (ref <130)
Total CHOL/HDL Ratio: 1.8 (calc) (ref <5.0)
Triglycerides: 59 mg/dL (ref <150)

## 2019-07-22 LAB — POCT GLYCOSYLATED HEMOGLOBIN (HGB A1C): Hemoglobin A1C: 6.1 % — AB (ref 4.0–5.6)

## 2019-07-22 MED ORDER — LORATADINE 10 MG PO TABS: 10.0000 mg | ORAL_TABLET | Freq: Every day | ORAL | 1 refills | Status: DC

## 2019-07-22 MED ORDER — FLUTICASONE PROPIONATE 50 MCG/ACT NA SUSP: 2.0000 | Freq: Every day | NASAL | 1 refills | Status: DC

## 2019-07-22 MED ORDER — SPIRONOLACTONE-HCTZ 25-25 MG PO TABS: 1.0000 | ORAL_TABLET | Freq: Every day | ORAL | 0 refills | Status: DC

## 2019-07-22 NOTE — Progress Notes (Signed)
Name: Stacy [name]   MRN: [id number]    DOB: May 26, 1945   Date:07/22/2019       Progress Note  Subjective  Chief Complaint  Chief Complaint  Patient presents with  . Follow-up  . Diabetes  . Hypertension    HPI  DMII: doing well, on diet only, can't take ACE caused angioedema.. Denies polyphagia, polydipsia or polyuria. She is following a diabetic diet. She has been compliant with Crestor 5 mg M, W and Fridays and we will recheck level today. A1C today was 6.1%, eye exam it up to date . She has onychomycosis and gets nail trimmed by podiatrist   HTN: taking diuretic, tolerating it well. Denies chest pain, dizziness, palpitation, orthopneaor SOB,lower extremity swelling that has improved with compression stocking hoses. She was on Triamterene hctz 50/50 and was having a lot of leg cramps we decreased dose to 37.5/25 and she alternates half pill or one pill, she states it cost more through [employer], we will try changing to Spironolactone hctz and monitor.   Obesity: she has DM and HTN, discussed life style modification.Weight 07/2018 was 193 lbs today it is  Up to 201 lbs. She states not as active since pandemic, stopped going to the gym   Vitamin D deficiency: continue supplementation, last labs was at goal, continue supplementation   AR: she needs refill of flonase, she states symptoms controlled, not currently having nasal congestion or rhinorrhea   Patient Active Problem List   Diagnosis Date Noted  . Onychomycosis of multiple toenails with type 2 diabetes mellitus ([place]) 01/23/2019  . Pain due to onychomycosis of toenails of both feet 10/06/2018  . Diabetes mellitus without complication ([place]) 79/02/4095  . Leg edema 12/11/2016  . Abnormal EKG 12/10/2016  . Radial scar of breast 05/30/2015  . History of foot surgery 12/15/2014  . Hypertension, benign 10/07/2014  . Seasonal allergies 10/07/2014  . Diabetes mellitus type 2, diet-controlled ([place]) 10/07/2014  . Morbid  obesity ([place]) 10/07/2014  . Vitamin D deficiency 10/07/2014  . Varicose veins 10/07/2014  . Left Achilles tendinitis 10/07/2014  . Family history of breast cancer 05/12/2012  . 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 BREAST BIOPSY Right 2015   3 stereo biopsies. 2 benign. one was radial scar, no excision done for radial scar  . BREAST BIOPSY Left 07/16/2017   BIPHASIC STROMAL AND EPITHELIAL LESION./ Dr Bary Castilla  . BREAST EXCISIONAL BIOPSY Left 1968   x 3 per pt  . BREAST EXCISIONAL BIOPSY Right 2007   benign  . COLONOSCOPY  2011   DR,ISHAKIS  . FOOT SURGERY  2004  . HEEL SPUR SURGERY Left 12/15/2014   Dr. Earleen Newport at [facility]    Family History  Problem Relation Age of Onset  . Breast cancer Sister 11  . Arthritis Mother   . Stroke Paternal Grandmother   . Cancer Brother 33       kidney  . Cancer Maternal Grandmother        Gallbladder    Social History   Tobacco Use  . Smoking status: Never Smoker  . Smokeless tobacco: Never Used  . Tobacco comment: smoking cessation materials not required  Substance Use Topics  . Alcohol use: No    Alcohol/week: 0.0 standard drinks     Current Outpatient Medications:  .  Cholecalciferol (VITAMIN D-3 PO), Take 1,000 Units by mouth daily. , Disp: , Rfl:  .  Cinnamon 500 MG capsule, Take 1,000 mg by mouth daily., Disp: , Rfl:  .  FISH OIL-BORAGE-FLAX-SAFFLOWER PO, Take 2 tablets by mouth daily., Disp: , Rfl:  .  fluticasone (FLONASE) 50 MCG/ACT nasal spray, Place 2 sprays into both nostrils daily., Disp: 16 g, Rfl: 1 .  ketoconazole (NIZORAL) 2 % cream, Apply 1 application topically daily. (Patient taking differently: Apply 1 application topically daily as needed. ), Disp: 60 g, Rfl: 0 .  loratadine (CLARITIN) 10 MG tablet, Take 1 tablet (10 mg total) by mouth daily., Disp: 90 tablet, Rfl: 1 .  Multiple Vitamins-Minerals (MULTIVITAMIN GUMMIES ADULT PO), Take  by mouth daily., Disp: , Rfl:  .  olopatadine (PATANOL) 0.1 % ophthalmic solution, 1 drop 2 (two) times daily., Disp: , Rfl:  .  POTASSIUM PO, Take 1 tablet by mouth daily. , Disp: , Rfl:  .  rosuvastatin (CRESTOR) 5 MG tablet, Take 1 tablet (5 mg total) by mouth 3 (three) times a week., Disp: 48 tablet, Rfl: 0 .  triamterene-hydrochlorothiazide (MAXZIDE-25) 37.5-25 MG tablet, Take 1 tablet by mouth daily., Disp: 90 tablet, Rfl: 1  Allergies  Allergen Reactions  . Altace [Ramipril] Swelling    I personally reviewed active problem list, medication list, allergies, family history, social history, health maintenance with the patient/caregiver today.   ROS  Constitutional: Negative for fever or weight change.  Respiratory: Negative for cough and shortness of breath.   Cardiovascular: Negative for chest pain or palpitations.  Gastrointestinal: Negative for abdominal pain, no bowel changes.  Musculoskeletal: Negative for gait problem or joint swelling.  Skin: Negative for rash.  Neurological: Negative for dizziness or headache.  No other specific complaints in a complete review of systems (except as listed in HPI above).   Objective   Vitals:   07/22/19 1012  BP: 132/70  Pulse: 92  Resp: 16  Temp: 97.8 F (36.6 C)  TempSrc: Temporal  SpO2: 100%  Weight: 201 lb (91.2 kg)  Height: 5\' 7"  (1.702 m)    Body mass index is 31.48 kg/m.  Physical Exam  Constitutional: Patient appears well-developed and well-nourished. Obese No distress.  HEENT: head atraumatic, normocephalic, pupils equal and reactive to light, e neck supple Cardiovascular: Normal rate, regular rhythm and normal heart sounds.  No murmur heard. 1 plus  BLE edema wearing compression stocking hoses . Pulmonary/Chest: Effort normal and breath sounds normal. No respiratory distress. Abdominal: Soft.  There is no tenderness. Psychiatric: Patient has a normal mood and affect. behavior is normal. Judgment and thought  content normal.   PHQ2/9: Depression screen [facility] 2/9 07/22/2019 04/20/2019 03/02/2019 01/26/2019 01/23/2019  Decreased Interest 0 0 0 0 0  Down, Depressed, Hopeless 0 0 0 0 0  PHQ - 2 Score 0 0 0 0 0  Altered sleeping 0 - - - 0  Tired, decreased energy 0 - - - 0  Change in appetite 0 - - - 0  Feeling bad or failure about yourself  0 - - - 0  Trouble concentrating 0 - - - 0  Moving slowly or fidgety/restless 0 - - - 0  Suicidal thoughts 0 - - - 0  PHQ-9 Score 0 - - - 0  Difficult doing work/chores - - - - Not difficult at all    phq 9 is negative  Fall Risk: Fall Risk  07/22/2019 04/20/2019 01/26/2019 01/23/2019 10/14/2018  Falls in the past year? 0 0 0 0 0  Number falls in past yr: 0 - - 0 0  Injury with Fall? 0 - - 0 0  Risk for fall due to : - - - - -  Risk for fall due to: Comment - - - - -  Follow up - - Falls prevention discussed - Falls prevention discussed     Assessment & Plan  1. Diabetes mellitus type 2 in obese (HCC)  - POCT HgB A1C  2. Hypertension, benign  - COMPLETE METABOLIC PANEL WITH GFR - spironolactone-hydrochlorothiazide (ALDACTAZIDE) 25-25 MG tablet; Take 1 tablet by mouth daily.  Dispense: 90 tablet; Refill: 0  3. Leucopenia   - CBC with Differential/Platelet  4. Vitamin D deficiency  Continue vitamin D supplementation   5. Dyslipidemia associated with type 2 diabetes mellitus (HCC)  - Lipid panel  6. Hypertension associated with type 2 diabetes mellitus (HCC)  - spironolactone-hydrochlorothiazide (ALDACTAZIDE) 25-25 MG tablet; Take 1 tablet by mouth daily.  Dispense: 90 tablet; Refill: 0  7. Seasonal allergies  - fluticasone (FLONASE) 50 MCG/ACT nasal spray; Place 2 sprays into both nostrils daily.  Dispense: 48 g; Refill: 1

## 2019-07-23 ENCOUNTER — Ambulatory Visit: Payer: PPO | Admitting: Family Medicine

## 2019-07-26 ENCOUNTER — Other Ambulatory Visit: Payer: Self-pay | Admitting: Family Medicine

## 2019-07-26 DIAGNOSIS — E875 Hyperkalemia: Secondary | ICD-10-CM

## 2019-08-03 ENCOUNTER — Other Ambulatory Visit: Payer: Self-pay | Admitting: Family Medicine

## 2019-08-03 ENCOUNTER — Telehealth: Payer: Self-pay | Admitting: Family Medicine

## 2019-08-03 ENCOUNTER — Other Ambulatory Visit: Payer: Self-pay | Admitting: Physician Assistant

## 2019-08-03 ENCOUNTER — Ambulatory Visit
Admission: RE | Admit: 2019-08-03 | Discharge: 2019-08-03 | Disposition: A | Discharge status: Home / Self Care | Payer: Medicare PPO | Source: Ambulatory Visit | Attending: Physician Assistant | Admitting: Physician Assistant

## 2019-08-03 DIAGNOSIS — R928 Other abnormal and inconclusive findings on diagnostic imaging of breast: Secondary | ICD-10-CM

## 2019-08-03 DIAGNOSIS — Z1231 Encounter for screening mammogram for malignant neoplasm of breast: Secondary | ICD-10-CM | POA: Insufficient documentation

## 2019-08-03 DIAGNOSIS — N6489 Other specified disorders of breast: Secondary | ICD-10-CM

## 2019-08-03 LAB — POTASSIUM: Potassium: 4.9 mmol/L (ref 3.5–5.3)

## 2019-08-03 MED ORDER — SPIRONOLACTONE 25 MG PO TABS: 25.0000 mg | ORAL_TABLET | Freq: Every day | ORAL | 0 refills | Status: DC

## 2019-08-03 MED ORDER — HYDROCHLOROTHIAZIDE 25 MG PO TABS: 25.0000 mg | ORAL_TABLET | Freq: Every day | ORAL | 0 refills | Status: DC

## 2019-08-03 NOTE — Telephone Encounter (Unsigned)
Copied from [place] [id number]. Topic: General - Other >> Aug 03, 2019  2:14 PM Leward Quan A wrote: Reason for CRM: Patient called to inform Dr Ancil Boozer that the Rx given for spironolactone-hydrochlorothiazide (ALDACTAZIDE) 25-25 MG tablet is too expensive so she states that she will continue on the prewvious medication given since it is not at all expensive. Any questions please call patient

## 2019-08-03 NOTE — Telephone Encounter (Signed)
Pharmacy called in to make provider aware that pt decided to keep taking her old medication triamterene/hctz instead of switching to spironolactone-hydrochlorothiazide (ALDACTAZIDE) 25-25 MG tablet

## 2019-08-04 NOTE — Telephone Encounter (Signed)
Pt was confused thought that the new medication would be free/ cheaper. She will like to go back to Triamterene hctz 50/50 . She said that you told her that if did not work she could go ahead and go back to the her old medication above

## 2019-08-05 ENCOUNTER — Telehealth: Payer: Self-pay

## 2019-08-05 NOTE — Telephone Encounter (Signed)
Pt. Given results, verbalizes understanding. 

## 2019-08-05 NOTE — Telephone Encounter (Unsigned)
Copied from [place] [id number]. Topic: General - Call Back - No Documentation >> Aug 04, 2019  2:38 PM Jaynie Collins D wrote: Reason for CRM: Patient would like to have nurse or PCP to call her back in regards to a new medication that was prescribed. Patients states that the new fluid medication costs $32 and the old medication only costs $1.84. Patient would like to keep old medication due to cost but wants to make sure that is ok with doctor.

## 2019-08-06 NOTE — Telephone Encounter (Signed)
Called patient. Patient prefers to stay on the triamterene and hctz because it is cheaper and she can take only one pill. She does not want to take two separate medications. She feels like her potassium was hight because she was taking triamterene and a potassium pill and she was getting to much. Please advise.

## 2019-08-07 ENCOUNTER — Ambulatory Visit: Payer: Self-pay

## 2019-08-07 ENCOUNTER — Telehealth: Payer: Self-pay

## 2019-08-07 NOTE — Telephone Encounter (Signed)
Called patient and left message about her medication. PCP would like her to take the new medications that she prescribed.

## 2019-08-07 NOTE — Chronic Care Management (AMB) (Signed)
  Chronic Care Management   Outreach Note  08/07/2019 Name: Stacy [name] MRN: [id number] DOB: 1945-03-13  Primary Care Provider: Steele Sizer, MD Reason for referral : Chronic Care Management   An unsuccessful telephone outreach was attempted today. [name] is currently engaged with the chronic care management team. A routine outreach was conducted today.  A HIPAA compliant voice message was left today requesting a return call.   Follow Up Plan: The care management team will reach out to [name] within the next two weeks.   [facility] [phone]

## 2019-08-11 ENCOUNTER — Other Ambulatory Visit: Payer: Self-pay

## 2019-08-11 ENCOUNTER — Encounter: Payer: Self-pay | Admitting: General Surgery

## 2019-08-11 ENCOUNTER — Ambulatory Visit (INDEPENDENT_AMBULATORY_CARE_PROVIDER_SITE_OTHER): Payer: Self-pay | Admitting: General Surgery

## 2019-08-11 NOTE — Patient Instructions (Signed)
Please see your follow up appointment listed below.  °

## 2019-08-12 NOTE — Progress Notes (Signed)
error 

## 2019-08-14 ENCOUNTER — Ambulatory Visit
Admission: RE | Admit: 2019-08-14 | Discharge: 2019-08-14 | Disposition: A | Discharge status: Home / Self Care | Payer: Medicare PPO | Source: Ambulatory Visit | Attending: Physician Assistant | Admitting: Physician Assistant

## 2019-08-14 ENCOUNTER — Other Ambulatory Visit: Payer: Self-pay

## 2019-08-14 DIAGNOSIS — N6489 Other specified disorders of breast: Secondary | ICD-10-CM | POA: Diagnosis present

## 2019-08-14 DIAGNOSIS — R928 Other abnormal and inconclusive findings on diagnostic imaging of breast: Secondary | ICD-10-CM | POA: Diagnosis present

## 2019-08-17 ENCOUNTER — Telehealth: Payer: Self-pay

## 2019-08-20 ENCOUNTER — Ambulatory Visit: Payer: Self-pay | Admitting: General Surgery

## 2019-08-25 ENCOUNTER — Other Ambulatory Visit: Payer: Self-pay

## 2019-08-25 ENCOUNTER — Ambulatory Visit (INDEPENDENT_AMBULATORY_CARE_PROVIDER_SITE_OTHER): Payer: Medicare PPO | Admitting: General Surgery

## 2019-08-25 ENCOUNTER — Encounter: Payer: Self-pay | Admitting: General Surgery

## 2019-08-25 VITALS — BP 132/51 | HR 80 | Temp 98.6°F | Ht 67.0 in | Wt 200.0 lb

## 2019-08-25 DIAGNOSIS — Z1231 Encounter for screening mammogram for malignant neoplasm of breast: Secondary | ICD-10-CM

## 2019-08-25 NOTE — Patient Instructions (Addendum)
Continue self breast exams. Call office for any new breast issues or concerns. Follow up if needed.  Follow up in one year with your PCP for annual mammogram due in July.

## 2019-08-25 NOTE — Progress Notes (Signed)
Patient ID: Stacy [name], female   DOB: June 17, 1945, 74 y.o.   MRN: [id number]  Chief Complaint  Patient presents with  . Follow-up    HPI Stacy [name] is a 74 y.o. female.  She is a former patient of Dr. Dwyane Luo and was followed by him for many years after having undergone a number of breast biopsies for what proved to be benign disease.  She had a repeat screening mammogram in the middle of July and was recalled for asymmetry in the right breast.  She had her follow-up images on July 30 and is here today to discuss those results.  She denies any new lumps or masses in either breast.  No skin changes or nipple discharge.  No breast pain.   Past Medical History:  Diagnosis Date  . Hypertension   . Neoplasm of uncertain behavior of breast 2007    Past Surgical History:  Procedure Laterality Date  . BREAST BIOPSY Right 2015   3 stereo biopsies. 2 benign. one was radial scar, no excision done for radial scar  . BREAST BIOPSY Left 07/16/2017   BIPHASIC STROMAL AND EPITHELIAL LESION./ Dr Bary Castilla  . BREAST EXCISIONAL BIOPSY Left 1968   x 3 per pt  . BREAST EXCISIONAL BIOPSY Right 2007   benign  . COLONOSCOPY  2011   DR,ISHAKIS  . FOOT SURGERY  2004  . HEEL SPUR SURGERY Left 12/15/2014   Dr. Earleen Newport at [facility]    Family History  Problem Relation Age of Onset  . Breast cancer Sister 33  . Arthritis Mother   . Stroke Paternal Grandmother   . Cancer Brother 78       kidney  . Cancer Maternal Grandmother        Gallbladder    Social History Social History   Tobacco Use  . Smoking status: Never Smoker  . Smokeless tobacco: Never Used  . Tobacco comment: smoking cessation materials not required  Vaping Use  . Vaping Use: Never used  Substance Use Topics  . Alcohol use: No    Alcohol/week: 0.0 standard drinks  . Drug use: No    Allergies  Allergen Reactions  . Altace [Ramipril] Swelling    Current Outpatient Medications  Medication Sig Dispense  Refill  . Cholecalciferol (VITAMIN D-3 PO) Take 1,000 Units by mouth daily.     . Cinnamon 500 MG capsule Take 1,000 mg by mouth daily.    Marland Kitchen FISH OIL-BORAGE-FLAX-SAFFLOWER PO Take 2 tablets by mouth daily.    . fluticasone (FLONASE) 50 MCG/ACT nasal spray Place 2 sprays into both nostrils daily. 48 g 1  . ketoconazole (NIZORAL) 2 % cream Apply 1 application topically daily. (Patient taking differently: Apply 1 application topically daily as needed. ) 60 g 0  . loratadine (CLARITIN) 10 MG tablet Take 1 tablet (10 mg total) by mouth daily. 90 tablet 1  . Multiple Vitamins-Minerals (MULTIVITAMIN GUMMIES ADULT PO) Take by mouth daily.    Marland Kitchen olopatadine (PATANOL) 0.1 % ophthalmic solution 1 drop 2 (two) times daily.    . rosuvastatin (CRESTOR) 5 MG tablet Take 1 tablet (5 mg total) by mouth 3 (three) times a week. 48 tablet 0  . triamterene-hydrochlorothiazide (MAXZIDE-25) 37.5-25 MG tablet Take 1 tablet by mouth daily.     No current facility-administered medications for this visit.    Review of Systems Review of Systems  All other systems reviewed and are negative.   Blood pressure (!) 132/51, pulse 80, temperature 98.6 F (  37 C), height 5\' 7"  (1.702 m), weight 200 lb (90.7 kg), SpO2 96 %.  Physical Exam Physical Exam Vitals reviewed. Exam conducted with a chaperone present.  Constitutional:      General: She is not in acute distress.    Appearance: Normal appearance. She is obese.  HENT:     Head: Normocephalic and atraumatic.     Nose:     Comments: Covered with a mask    Mouth/Throat:     Comments: Covered with a mask Eyes:     General: No scleral icterus.       Right eye: No discharge.        Left eye: No discharge.     Comments: Wearing glasses, mild periorbital edema.  Neck:     Comments: No palpable thyromegaly or dominant thyroid masses appreciated.  There is no palpable cervical or supraclavicular lymphadenopathy.  The trachea is midline.  The thyroid moves freely with  deglutition. Cardiovascular:     Rate and Rhythm: Normal rate and regular rhythm.  Pulmonary:     Effort: Pulmonary effort is normal.     Breath sounds: Normal breath sounds.  Chest:     Breasts:        Right: No mass, nipple discharge or skin change.        Left: No mass, nipple discharge or skin change.     Comments: Scars on both breasts, consistent with prior excisional biopsies. Abdominal:     Palpations: Abdomen is soft.  Musculoskeletal:     Right lower leg: Edema present.     Left lower leg: Edema present.  Lymphadenopathy:     Upper Body:     Right upper body: No supraclavicular, axillary or pectoral adenopathy.     Left upper body: No supraclavicular, axillary or pectoral adenopathy.  Skin:    General: Skin is warm and dry.  Neurological:     General: No focal deficit present.     Mental Status: She is alert and oriented to person, place, and time.  Psychiatric:        Mood and Affect: Mood normal.        Behavior: Behavior normal.     Data Reviewed I reviewed the imaging performed last month and concur with the radiologist's impressions copied here:  CLINICAL DATA:  Screening.  EXAM: DIGITAL SCREENING BILATERAL MAMMOGRAM WITH TOMO AND CAD  COMPARISON:  Previous exam(s).  ACR Breast Density Category b: There are scattered areas of fibroglandular density.  FINDINGS: In the right breast, a possible asymmetry warrants further evaluation. In the left breast, no findings suspicious for malignancy. Images were processed with CAD.  IMPRESSION: Further evaluation is suggested for possible asymmetry in the right breast.  RECOMMENDATION: Diagnostic mammogram and possibly ultrasound of the right breast. (Code:[id number]-74M)  The patient will be contacted regarding the findings, and additional imaging will be scheduled.  BI-RADS CATEGORY  0: Incomplete. Need additional imaging evaluation and/or prior mammograms for comparison.  CLINICAL DATA:   74 year old patient recalled from recent screening mammogram for evaluation of a possible asymmetry in the right breast.  EXAM: DIGITAL DIAGNOSTIC UNILATERAL RIGHT MAMMOGRAM WITH TOMO AND CAD  COMPARISON:  Previous exam(s).  ACR Breast Density Category b: There are scattered areas of fibroglandular density.  FINDINGS: Spot compression views of the right breast show dispersion of fibroglandular tissue with no persistent asymmetry. Questioned area on screening mammogram is consistent with fibroglandular breast tissue. There are biopsy clips from previous image guided biopsies. No suspicious  findings.  Mammographic images were processed with CAD.  IMPRESSION: No evidence of malignancy in the right breast.  RECOMMENDATION: Screening mammogram in one year.(Code:[id number]-01Y)  Assessment This is a 74 year old former patient of Dr. Dwyane Luo who had had several biopsies in the past for what proved to be benign breast disease.  Her recent screening mammogram resulted in a call back to further evaluate some asymmetry on the right, however the subsequent imaging was negative for malignancy.  There were no concerning findings on physical exam.  Plan At this time, I do not think there is any utility in having her continue to follow-up in the surgery office.  [name] states that sometime ago, when she was being followed by Dr. Jamal Collin and Dr. Bary Castilla, she expressed interest in simply having her primary care provider monitor her annual mammography, but that for some reason, the care was never transferred back.  I assured her that I would send a copy of my note to Dr. Ancil Boozer and that it was perfectly fine for her to undergo annual mammography under the supervision of her primary care provider.  We will see her back on an as needed basis.    Fredirick Maudlin 08/25/2019, 11:23 AM

## 2019-08-26 ENCOUNTER — Ambulatory Visit: Payer: Self-pay

## 2019-08-26 DIAGNOSIS — E669 Obesity, unspecified: Secondary | ICD-10-CM

## 2019-08-26 DIAGNOSIS — I10 Essential (primary) hypertension: Secondary | ICD-10-CM

## 2019-08-26 NOTE — Chronic Care Management (AMB) (Signed)
  Chronic Care Management   Follow Up Note   08/26/2019 Name: Charnee Turnipseed MRN: [id number] DOB: March 23, 1945  Primary Care Provider: Steele Sizer, MD Reason for referral : Chronic Care Management   Kanita Delage is a 74 y.o. year old female who is a primary care patient of Steele Sizer, MD. She has met her Nurse Care Management goals.  Review of Mrs. Filler' status, including review of consultants reports, relevant labs and test results was conducted today. Collaboration with appropriate care team members was performed as part of the comprehensive evaluation and provision of chronic care management services.    SDOH (Social Determinants of Health) assessments performed: No    Outpatient Encounter Medications as of 08/26/2019  Medication Sig  . Cholecalciferol (VITAMIN D-3 PO) Take 1,000 Units by mouth daily.   . Cinnamon 500 MG capsule Take 1,000 mg by mouth daily.  Marland Kitchen FISH OIL-BORAGE-FLAX-SAFFLOWER PO Take 2 tablets by mouth daily.  . fluticasone (FLONASE) 50 MCG/ACT nasal spray Place 2 sprays into both nostrils daily.  Marland Kitchen ketoconazole (NIZORAL) 2 % cream Apply 1 application topically daily. (Patient taking differently: Apply 1 application topically daily as needed. )  . loratadine (CLARITIN) 10 MG tablet Take 1 tablet (10 mg total) by mouth daily.  . Multiple Vitamins-Minerals (MULTIVITAMIN GUMMIES ADULT PO) Take by mouth daily.  Marland Kitchen olopatadine (PATANOL) 0.1 % ophthalmic solution 1 drop 2 (two) times daily.  . rosuvastatin (CRESTOR) 5 MG tablet Take 1 tablet (5 mg total) by mouth 3 (three) times a week.  . triamterene-hydrochlorothiazide (MAXZIDE-25) 37.5-25 MG tablet Take 1 tablet by mouth daily.   No facility-administered encounter medications on file as of 08/26/2019.     Goals Addressed            This Visit's Progress   . COMPLETED: Improve Hypertension and Diabetes Self Management.       Current Barriers:  . Chronic Disease Management support, education, and care  coordination needs related to hypertension an diabetes.  Clinical Goals: . Over the next 90 days, patient will take all medications as prescribed. . Over the next 90 days, patient will attend all scheduled medical appointments . Over the next 90 days, patient will monitor and record BP readings. . Over the next 90 days, patient will continue to monitor blood glucose readings and maintain a log. . Over the next 90 days, patient will comply with recommended cardiac prudent and diabetic diets. .   Interventions related to HTN and DMII:  . Discussed plans for care management follow up. Mrs. Linden reports doing very well. She has met her Nurse Care Management goals. Reports compliance with medications and treatment recommendations. She was encouraged to continue provider follow-up visits as scheduled to ensure lab values are within the recommended range. Denies urgent concerns or changes in care management needs. Agreed to call if additional assistance is needed    Patient Self Care Activities:  . Self administers medications as prescribed . Attends scheduled provider appointments . Calls pharmacy for medication refills . Performs ADL's independently . Performs IADL's independently   Please see past updates related to this goal by clicking on the "Past Updates" button in the selected goal         PLAN Mrs. Quant has met her Nurse Care Management goals. She agreed to call if her care management needs change. Our team will gladly assist.    [name] [phone]

## 2019-08-27 ENCOUNTER — Ambulatory Visit: Payer: Self-pay | Admitting: Pharmacist

## 2019-08-27 ENCOUNTER — Other Ambulatory Visit: Payer: Self-pay

## 2019-08-27 DIAGNOSIS — I10 Essential (primary) hypertension: Secondary | ICD-10-CM

## 2019-08-27 DIAGNOSIS — E119 Type 2 diabetes mellitus without complications: Secondary | ICD-10-CM

## 2019-08-27 NOTE — Chronic Care Management (AMB) (Signed)
Chronic Care Management Pharmacy  Name: Stacy [name]  MRN: [id number] DOB: August 10, 1945  Chief Complaint/ HPI  Stacy [name],  74 y.o. , female presents for their Follow-Up CCM visit with the clinical pharmacist via telephone due to COVID-19 Pandemic.  PCP : Steele Sizer, MD  Their chronic conditions include: DM, HLD, HTN  Office Visits:NA  Consult Visit:NA  Medications: Outpatient Encounter Medications as of 08/27/2019  Medication Sig  . Cholecalciferol (VITAMIN D-3 PO) Take 1,000 Units by mouth daily.   . Cinnamon 500 MG capsule Take 1,000 mg by mouth daily.  Marland Kitchen FISH OIL-BORAGE-FLAX-SAFFLOWER PO Take 2 tablets by mouth daily.  . fluticasone (FLONASE) 50 MCG/ACT nasal spray Place 2 sprays into both nostrils daily.  Marland Kitchen ketoconazole (NIZORAL) 2 % cream Apply 1 application topically daily. (Patient taking differently: Apply 1 application topically daily as needed. )  . loratadine (CLARITIN) 10 MG tablet Take 1 tablet (10 mg total) by mouth daily.  . Multiple Vitamins-Minerals (MULTIVITAMIN GUMMIES ADULT PO) Take by mouth daily.  Marland Kitchen olopatadine (PATANOL) 0.1 % ophthalmic solution 1 drop 2 (two) times daily.  . rosuvastatin (CRESTOR) 5 MG tablet Take 1 tablet (5 mg total) by mouth 3 (three) times a week.  . triamterene-hydrochlorothiazide (MAXZIDE-25) 37.5-25 MG tablet Take 1 tablet by mouth daily.   No facility-administered encounter medications on file as of 08/27/2019.     Financial Resource Strain: Low Risk   . Difficulty of Paying Living Expenses: Not very hard     Current Diagnosis/Assessment:  Goals Addressed            This Visit's Progress   . Diabetes - goal A1c < 7%       CARE PLAN ENTRY (see longitudinal plan of care for additional care plan information)  Current Barriers:  . Diabetes: type 2; complicated by chronic medical conditions including hyperlipidemia Lab Results  Component Value Date   HGBA1C 6.2 (H) 02/23/2019 .   Lab Results   Component Value Date   CREATININE 0.89 02/23/2019   CREATININE 0.77 07/22/2018   CREATININE 0.83 10/11/2017 .   Marland Kitchen No results found for: EGFR > 60 ml/min . Current antihyperglycemic regimen: none . Denies hypoglycemic symptoms, including dizziness, lightheadedness, shaking, sweating . Denies hyperglycemic symptoms, including polyuria, polydipsia, polyphagia, nocturia, blurred vision, neuropathy . Current meal patterns: balanced diet, avoids starchy foods . Current exercise: NA . Current blood glucose readings: doesn't check  Pharmacist Clinical Goal(s):  Marland Kitchen Over the next 90 days, patient will work with PharmD and primary care provider to address lifestyle control type 2 diabetes to ensure no medication needed  Interventions: . Comprehensive medication review performed, medication list updated in electronic medical record . Inter-disciplinary care team collaboration (see longitudinal plan of care) . Counseled on goal and reason for no ACE inhibitor or Angiotensin Receptor Blocker (angioedema) . Counseled on cinnamon not reducing A1c  Patient Self Care Activities:  . Patient will contact provider with any episodes of hypoglycemia . Patient will report any questions or concerns to provider or clinical pharmacist  Follow up goal documentation      Hyperlipidemia   LDL goal < 100  Lipid Panel     Component Value Date/Time   CHOL 137 07/22/2019 1110   CHOL 174 10/07/2014 1013   TRIG 59 07/22/2019 1110   HDL 75 07/22/2019 1110   HDL 83 10/07/2014 1013   LDLCALC 48 07/22/2019 1110    Hepatic Function Latest Ref Rng & Units 07/22/2019 02/23/2019 07/22/2018  Total Protein 6.1 - 8.1 g/dL 7.4 7.2 6.9  Albumin 3.6 - 5.1 g/dL - - -  AST 10 - 35 U/L 17 14 17   ALT 6 - 29 U/L 19 14 18   Alk Phosphatase 33 - 130 U/L - - -  Total Bilirubin 0.2 - 1.2 mg/dL 0.4 0.4 0.5     The 10-year ASCVD risk score Mikey Bussing DC Jr., et al., 2013) is: 23.6%   Values used to calculate the score:     Age: 66  years     Sex: Female     Is Non-Hispanic African American: Yes     Diabetic: Yes     Tobacco smoker: No     Systolic Blood Pressure: 081 mmHg     Is BP treated: Yes     HDL Cholesterol: 75 mg/dL     Total Cholesterol: 137 mg/dL   Patient has failed these meds in past: NA Patient is currently controlled on the following medications:  . Crestor 65m tiwk  We discussed:   At goal Denies myalgias  Plan  Continue current medications   Diabetes   Recent Relevant Labs: Lab Results  Component Value Date/Time   HGBA1C 6.1 (A) 07/22/2019 10:20 AM   HGBA1C 6.2 (H) 02/23/2019 09:47 AM   HGBA1C 6.1 (A) 07/22/2018 11:10 AM   HGBA1C 6.3 (H) 10/11/2017 10:58 AM   MICROALBUR 0.3 02/23/2019 09:47 AM   MICROALBUR 0.2 10/11/2017 10:58 AM   MICROALBUR Neg 10/11/2016 08:28 AM   MICROALBUR negative 10/10/2015 08:39 AM     Checking BG: Rarely  Recent FBG Readings: NA Patient is currently controlled on the following medications: Cinnamon 1g daily   Last diabetic Foot exam:  Lab Results  Component Value Date/Time   HMDIABEYEEXA No Retinopathy 12/29/2018 12:00 AM    Last diabetic Eye exam: No results found for: HMDIABFOOTEX   We discussed: At goal CStorydoesn't reduce A1c, just short term sugars  Plan  Continue control with diet and exercise   Hypertension   BP goal is:  <140/90  Office blood pressures are  BP Readings from Last 3 Encounters:  08/25/19 (!) 132/51  08/11/19 (!) 164/76  07/22/19 132/70   Patient checks BP at home infrequently Patient home BP readings are ranging: NA  Patient has failed these meds in the past: KCl Patient is currently controlled on the following medications:  .Marland KitchenMaxzide 37.5/25mg daily  We discussed: At goal Denies hypotension and orthostatics Had elevated K+, so stopped spironolactone, KCl Counseled triamterene as potassium sparing  Plan  Continue current medications   Medication Management   [place] Uses pill box? Yes Pt endorses 100% compliance  We discussed:  D/c potassium due to triamterene MagOX for cramping  Plan  Continue current medication management strategy  Follow up: 3 month phone visit  TMilus Height PharmD, BHock[phone]

## 2019-08-31 NOTE — Patient Instructions (Addendum)
Visit Information  Goals Addressed            This Visit's Progress   . Diabetes - goal A1c < 7%       CARE PLAN ENTRY (see longitudinal plan of care for additional care plan information)  Current Barriers:  . Diabetes: type 2; complicated by chronic medical conditions including hyperlipidemia Lab Results  Component Value Date   HGBA1C 6.2 (H) 02/23/2019 .   Lab Results  Component Value Date   CREATININE 0.89 02/23/2019   CREATININE 0.77 07/22/2018   CREATININE 0.83 10/11/2017 .   Marland Kitchen No results found for: EGFR > 60 ml/min . Current antihyperglycemic regimen: none . Denies hypoglycemic symptoms, including dizziness, lightheadedness, shaking, sweating . Denies hyperglycemic symptoms, including polyuria, polydipsia, polyphagia, nocturia, blurred vision, neuropathy . Current meal patterns: balanced diet, avoids starchy foods . Current exercise: NA . Current blood glucose readings: doesn't check  Pharmacist Clinical Goal(s):  Marland Kitchen Over the next 90 days, patient will work with PharmD and primary care provider to address lifestyle control type 2 diabetes to ensure no medication needed  Interventions: . Comprehensive medication review performed, medication list updated in electronic medical record . Inter-disciplinary care team collaboration (see longitudinal plan of care) . Counseled on goal and reason for no ACE inhibitor or Angiotensin Receptor Blocker (angioedema) . Counseled on cinnamon not reducing A1c  Patient Self Care Activities:  . Patient will contact provider with any episodes of hypoglycemia . Patient will report any questions or concerns to provider or clinical pharmacist  Follow up goal documentation       Print copy of patient instructions provided.   Telephone follow up appointment with pharmacy team member scheduled for: 3 months  Felton Clinton, PharmD, [place], CTTS Clinical Pharmacist [facility] [phone]  Diabetes Basics  Diabetes  (diabetes mellitus) is a long-term (chronic) disease. It occurs when the body does not properly use sugar (glucose) that is released from food after you eat. Diabetes may be caused by one or both of these problems:  Your pancreas does not make enough of a hormone called insulin.  Your body does not react in a normal way to insulin that it makes. Insulin lets sugars (glucose) go into cells in your body. This gives you energy. If you have diabetes, sugars cannot get into cells. This causes high blood sugar (hyperglycemia). Follow these instructions at home: How is diabetes treated? You may need to take insulin or other diabetes medicines daily to keep your blood sugar in balance. Take your diabetes medicines every day as told by your doctor. List your diabetes medicines here: Diabetes medicines  Name of medicine: ______________________________ ? Amount (dose): _______________ Time (a.m./p.m.): _______________ Notes: ___________________________________  Name of medicine: ______________________________ ? Amount (dose): _______________ Time (a.m./p.m.): _______________ Notes: ___________________________________  Name of medicine: ______________________________ ? Amount (dose): _______________ Time (a.m./p.m.): _______________ Notes: ___________________________________ If you use insulin, you will learn how to give yourself insulin by injection. You may need to adjust the amount based on the food that you eat. List the types of insulin you use here: Insulin  Insulin type: ______________________________ ? Amount (dose): _______________ Time (a.m./p.m.): _______________ Notes: ___________________________________  Insulin type: ______________________________ ? Amount (dose): _______________ Time (a.m./p.m.): _______________ Notes: ___________________________________  Insulin type: ______________________________ ? Amount (dose): _______________ Time (a.m./p.m.): _______________ Notes:  ___________________________________  Insulin type: ______________________________ ? Amount (dose): _______________ Time (a.m./p.m.): _______________ Notes: ___________________________________  Insulin type: ______________________________ ? Amount (dose): _______________ Time (a.m./p.m.): _______________ Notes: ___________________________________ How do I manage my  blood sugar?  Check your blood sugar levels using a blood glucose monitor as directed by your doctor. Your doctor will set treatment goals for you. Generally, you should have these blood sugar levels:  Before meals (preprandial): 80-130 mg/dL (4.4-7.2 mmol/L).  After meals (postprandial): below 180 mg/dL (10 mmol/L).  A1c level: less than 7%. Write down the times that you will check your blood sugar levels: Blood sugar checks  Time: _______________ Notes: ___________________________________  Time: _______________ Notes: ___________________________________  Time: _______________ Notes: ___________________________________  Time: _______________ Notes: ___________________________________  Time: _______________ Notes: ___________________________________  Time: _______________ Notes: ___________________________________  What do I need to know about low blood sugar? Low blood sugar is called hypoglycemia. This is when blood sugar is at or below 70 mg/dL (3.9 mmol/L). Symptoms may include:  Feeling: ? Hungry. ? Worried or nervous (anxious). ? Sweaty and clammy. ? Confused. ? Dizzy. ? Sleepy. ? Sick to your stomach (nauseous).  Having: ? A fast heartbeat. ? A headache. ? A change in your vision. ? Tingling or no feeling (numbness) around the mouth, lips, or tongue. ? Jerky movements that you cannot control (seizure).  Having trouble with: ? Moving (coordination). ? Sleeping. ? Passing out (fainting). ? Getting upset easily (irritability). Treating low blood sugar To treat low blood sugar, eat or drink  something sugary right away. If you can think clearly and swallow safely, follow the 15:15 rule:  Take 15 grams of a fast-acting carb (carbohydrate). Talk with your doctor about how much you should take.  Some fast-acting carbs are: ? Sugar tablets (glucose pills). Take 3-4 glucose pills. ? 6-8 pieces of hard candy. ? 4-6 oz (120-150 mL) of fruit juice. ? 4-6 oz (120-150 mL) of regular (not diet) soda. ? 1 Tbsp (15 mL) honey or sugar.  Check your blood sugar 15 minutes after you take the carb.  If your blood sugar is still at or below 70 mg/dL (3.9 mmol/L), take 15 grams of a carb again.  If your blood sugar does not go above 70 mg/dL (3.9 mmol/L) after 3 tries, get help right away.  After your blood sugar goes back to normal, eat a meal or a snack within 1 hour. Treating very low blood sugar If your blood sugar is at or below 54 mg/dL (3 mmol/L), you have very low blood sugar (severe hypoglycemia). This is an emergency. Do not wait to see if the symptoms will go away. Get medical help right away. Call your local emergency services (911 in the [place] Do not drive yourself to the hospital. Questions to ask your health care provider  Do I need to meet with a diabetes educator?  What equipment will I need to care for myself at home?  What diabetes medicines do I need? When should I take them?  How often do I need to check my blood sugar?  What number can I call if I have questions?  When is my next doctor's visit?  Where can I find a support group for people with diabetes? Where to find more information  American Diabetes Association: www.diabetes.org  American Association of Diabetes Educators: www.diabeteseducator.org/patient-resources Contact a doctor if:  Your blood sugar is at or above 240 mg/dL (13.3 mmol/L) for 2 days in a row.  You have been sick or have had a fever for 2 days or more, and you are not getting better.  You have any of these problems for more than 6  hours: ? You cannot eat or drink. ?  You feel sick to your stomach (nauseous). ? You throw up (vomit). ? You have watery poop (diarrhea). Get help right away if:  Your blood sugar is lower than 54 mg/dL (3 mmol/L).  You get confused.  You have trouble: ? Thinking clearly. ? Breathing. Summary  Diabetes (diabetes mellitus) is a long-term (chronic) disease. It occurs when the body does not properly use sugar (glucose) that is released from food after digestion.  Take insulin and diabetes medicines as told.  Check your blood sugar every day, as often as told.  Keep all follow-up visits as told by your doctor. This is important. This information is not intended to replace advice given to you by your health care provider. Make sure you discuss any questions you have with your health care provider. Document Revised: 09/24/2018 Document Reviewed: 04/05/2017 Elsevier Patient Education  [place].

## 2019-08-31 NOTE — Patient Instructions (Signed)
Thank you for allowing the Chronic Care Management team to participate in your care.   Goals Addressed            This Visit's Progress   . COMPLETED: Improve Hypertension and Diabetes Self Management.       Current Barriers:  . Chronic Disease Management support, education, and care coordination needs related to hypertension an diabetes.  Clinical Goals: . Over the next 90 days, patient will take all medications as prescribed. . Over the next 90 days, patient will attend all scheduled medical appointments . Over the next 90 days, patient will monitor and record BP readings. . Over the next 90 days, patient will continue to monitor blood glucose readings and maintain a log. . Over the next 90 days, patient will comply with recommended cardiac prudent and diabetic diets. .   Interventions related to HTN and DMII:  . Discussed plans for care management follow up. Stacy [name] reports doing very well. She has met her Nurse Care Management goals. Reports compliance with medications and treatment recommendations. She was encouraged to continue provider follow-up visits as scheduled to ensure lab values are within the recommended range. Denies urgent concerns or changes in care management needs. Agreed to call if additional assistance is needed    Patient Self Care Activities:  . Self administers medications as prescribed . Attends scheduled provider appointments . Calls pharmacy for medication refills . Performs ADL's independently . Performs IADL's independently   Please see past updates related to this goal by clicking on the "Past Updates" button in the selected goal         [name] verbalized understanding of the information discussed during the brief telephonic outreach today. Declined need for a mailed/printed copy of the information.  [name] has met her Nurse Care Management goals. She agreed to call if her care management needs change. Our team will gladly  assist.    [name] [phone]

## 2019-10-12 ENCOUNTER — Ambulatory Visit (INDEPENDENT_AMBULATORY_CARE_PROVIDER_SITE_OTHER): Payer: Medicare PPO | Admitting: Podiatry

## 2019-10-12 ENCOUNTER — Encounter: Payer: Self-pay | Admitting: Podiatry

## 2019-10-12 ENCOUNTER — Other Ambulatory Visit: Payer: Self-pay

## 2019-10-12 DIAGNOSIS — B351 Tinea unguium: Secondary | ICD-10-CM | POA: Diagnosis not present

## 2019-10-12 DIAGNOSIS — E119 Type 2 diabetes mellitus without complications: Secondary | ICD-10-CM

## 2019-10-12 DIAGNOSIS — M79674 Pain in right toe(s): Secondary | ICD-10-CM

## 2019-10-12 DIAGNOSIS — M79675 Pain in left toe(s): Secondary | ICD-10-CM

## 2019-10-12 NOTE — Progress Notes (Signed)
This patient returns to my office for at risk foot care.  This patient requires this care by a professional since this patient will be at risk due to having diabetes.  This patient is unable to cut nails herself since the patient cannot reach her nails.These nails are painful walking and wearing shoes.  This patient presents for at risk foot care today.  General Appearance  Alert, conversant and in no acute stress.  Vascular  Dorsalis pedis and posterior tibial  pulses are palpable  bilaterally.  Capillary return is within normal limits  bilaterally. Temperature is within normal limits  bilaterally.  Neurologic  Senn-Weinstein monofilament wire test within normal limits  bilaterally. Muscle power within normal limits bilaterally.  Nails Thick disfigured discolored nails with subungual debris  from hallux to fifth toes bilaterally. No evidence of bacterial infection or drainage bilaterally. Split hallux nail proximally with healed skin lesion medially at medial groove and split nail junction.  Orthopedic  No limitations of motion  feet .  No crepitus or effusions noted.  No bony pathology or digital deformities noted.  Skin  normotropic skin with no porokeratosis noted bilaterally.  No signs of infections or ulcers noted.     Onychomycosis  Pain in right toes  Pain in left toes  Consent was obtained for treatment procedures.   Mechanical debridement of nails 1-5  bilaterally performed with a nail nipper.  Filed with dremel without incident. No infection or ulcer.     Return office visit    3 months                  Told patient to return for periodic foot care and evaluation due to potential at risk complications.   Gardiner Barefoot DPM

## 2019-10-15 ENCOUNTER — Ambulatory Visit (INDEPENDENT_AMBULATORY_CARE_PROVIDER_SITE_OTHER): Payer: Medicare PPO

## 2019-10-15 DIAGNOSIS — Z0000 Encounter for general adult medical examination without abnormal findings: Secondary | ICD-10-CM | POA: Diagnosis not present

## 2019-10-15 DIAGNOSIS — Z1211 Encounter for screening for malignant neoplasm of colon: Secondary | ICD-10-CM | POA: Diagnosis not present

## 2019-10-15 DIAGNOSIS — Z780 Asymptomatic menopausal state: Secondary | ICD-10-CM | POA: Diagnosis not present

## 2019-10-15 NOTE — Progress Notes (Signed)
Subjective:   Stacy [name] is a 74 y.o. female who presents for Medicare Annual (Subsequent) preventive examination.  Virtual Visit via Telephone Note  I connected with  [name] on 10/15/19 at  9:20 AM EDT by telephone and verified that I am speaking with the correct person using two identifiers.  Medicare Annual Wellness visit completed telephonically due to Covid-19 pandemic.   Location: Patient: home Provider: [place]   I discussed the limitations, risks, security and privacy concerns of performing an evaluation and management service by telephone and the availability of in person appointments. The patient expressed understanding and agreed to proceed.  Unable to perform video visit due to video visit attempted and failed and/or patient does not have video capability.   Some vital signs may be absent or patient reported.   Clemetine Marker, LPN     Review of Systems     Cardiac Risk Factors include: advanced age (>23mn, >>6women);diabetes mellitus;hypertension;obesity (BMI >30kg/m2);dyslipidemia     Objective:    There were no vitals filed for this visit. There is no height or weight on file to calculate BMI.  Advanced Directives 10/15/2019 10/14/2018 10/11/2017 10/08/2016 04/09/2016 10/10/2015 04/06/2015  Does Patient Have a Medical Advance Directive? No No No No No No No  Would patient like information on creating a medical advance directive? Yes (MAU/Ambulatory/Procedural Areas - Information given) No - Patient declined Yes (MAU/Ambulatory/Procedural Areas - Information given) No - Patient declined - No - patient declined information No - patient declined information    Current Medications (verified) Outpatient Encounter Medications as of 10/15/2019  Medication Sig  . Cholecalciferol (VITAMIN D-3 PO) Take 1,000 Units by mouth daily.   . Cinnamon 500 MG capsule Take 1,000 mg by mouth daily.  .Marland KitchenFISH OIL-BORAGE-FLAX-SAFFLOWER PO Take 2 tablets by mouth daily.  .  fluticasone (FLONASE) 50 MCG/ACT nasal spray Place 2 sprays into both nostrils daily.  .Marland Kitchenketoconazole (NIZORAL) 2 % cream Apply 1 application topically daily. (Patient taking differently: Apply 1 application topically daily as needed. )  . loratadine (CLARITIN) 10 MG tablet Take 1 tablet (10 mg total) by mouth daily.  . Multiple Vitamins-Minerals (MULTIVITAMIN GUMMIES ADULT PO) Take by mouth daily.  .Marland Kitchenolopatadine (PATANOL) 0.1 % ophthalmic solution 1 drop 2 (two) times daily.  . rosuvastatin (CRESTOR) 5 MG tablet Take 1 tablet (5 mg total) by mouth 3 (three) times a week.  . triamterene-hydrochlorothiazide (MAXZIDE-25) 37.5-25 MG tablet Take 1 tablet by mouth daily.   No facility-administered encounter medications on file as of 10/15/2019.    Allergies (verified) Altace [ramipril]   History: Past Medical History:  Diagnosis Date  . Allergy   . Hyperlipidemia   . Hypertension   . Neoplasm of uncertain behavior of breast 2007   Past Surgical History:  Procedure Laterality Date  . BREAST BIOPSY Right 2015   3 stereo biopsies. 2 benign. one was radial scar, no excision done for radial scar  . BREAST BIOPSY Left 07/16/2017   BIPHASIC STROMAL AND EPITHELIAL LESION./ Dr BBary Castilla . BREAST EXCISIONAL BIOPSY Left 1968   x 3 per pt  . BREAST EXCISIONAL BIOPSY Right 2007   benign  . COLONOSCOPY  2011   DR,ISHAKIS  . FOOT SURGERY  2004  . HEEL SPUR SURGERY Left 12/15/2014   [facility]  Family History  Problem Relation Age of Onset  . Breast cancer Sister 555 . Arthritis Mother   . Stroke Paternal Grandmother   .  Cancer Brother 37       kidney  . Cancer Maternal Grandmother        Gallbladder   Social History   Socioeconomic History  . Marital status: Married    Spouse name: Richard  . Number of children: 1  . Years of education: Not on file  . Highest education level: 12th grade  Occupational History  . 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    Tobacco Use  . Smoking status: Never Smoker  . Smokeless tobacco: Never Used  . Tobacco comment: smoking cessation materials not required  Vaping Use  . Vaping Use: Never used  Substance and Sexual Activity  . Alcohol use: No    Alcohol/week: 0.0 standard drinks  . Drug use: No  . Sexual activity: Not Currently    Comment: Husband has prostate issues  Other Topics Concern  . Not on file  Social History Narrative  . Not on file   Social Determinants of Health   Financial Resource Strain: Low Risk   . Difficulty of Paying Living Expenses: Not very hard  Food Insecurity: No Food Insecurity  . Worried About Charity fundraiser in the Last Year: Never true  . Ran Out of Food in the Last Year: Never true  Transportation Needs: No Transportation Needs  . Lack of Transportation (Medical): No  . Lack of Transportation (Non-Medical): No  Physical Activity: Inactive  . Days of Exercise per Week: 0 days  . Minutes of Exercise per Session: 0 min  Stress: No Stress Concern Present  . Feeling of Stress : Not at all  Social Connections: Socially Integrated  . Frequency of Communication with Friends and Family: More than three times a week  . Frequency of Social Gatherings with Friends and Family: More than three times a week  . Attends Religious Services: More than 4 times per year  . Active Member of Clubs or Organizations: Yes  . Attends Archivist Meetings: More than 4 times per year  . Marital Status: Married    Tobacco Counseling Counseling given: Not Answered Comment: smoking cessation materials not required   Clinical Intake:  Pre-visit preparation completed: Yes  Pain : No/denies pain     Nutritional Risks: None Diabetes: Yes CBG done?: No Did pt. bring in CBG monitor from home?: No  How often do you need to have someone help you when you read instructions, pamphlets, or other written materials from your doctor or pharmacy?: 1 - Never  Nutrition Risk  Assessment:  Has the patient had any N/V/D within the last 2 months?  No  Does the patient have any non-healing wounds?  No  Has the patient had any unintentional weight loss or weight gain?  No   Diabetes:  Is the patient diabetic?  Yes  If diabetic, was a CBG obtained today?  No  Did the patient bring in their glucometer from home?  No  How often do you monitor your CBG's? Pt does not actively check blood sugar.   Financial Strains and Diabetes Management:  Are you having any financial strains with the device, your supplies or your medication? No .  Does the patient want to be seen by Chronic Care Management for management of their diabetes?  No  Would the patient like to be referred to a Nutritionist or for Diabetic Management?  No   Diabetic Exams:  Diabetic Eye Exam: Completed 12/29/18 negative retinopathy.   Diabetic Foot  Exam: Completed 10/12/19.    Interpreter Needed?: No  Information entered by :: Clemetine Marker LPN   Activities of Daily Living In your present state of health, do you have any difficulty performing the following activities: 10/15/2019 07/22/2019  Hearing? N N  Comment declines hearing aids -  Vision? N N  Comment - -  Difficulty concentrating or making decisions? N N  Walking or climbing stairs? N N  Dressing or bathing? N N  Doing errands, shopping? N N  Preparing Food and eating ? N -  Using the Toilet? N -  In the past six months, have you accidently leaked urine? N -  Do you have problems with loss of bowel control? N -  Managing your Medications? N -  Managing your Finances? N -  Housekeeping or managing your Housekeeping? N -  Some recent data might be hidden    Patient Care Team: Steele Sizer, MD as PCP - [facility] [place], Reed Breech, OD as Consulting Physician (Optometry) Bary Castilla, Forest Gleason, MD as Consulting Physician (General Surgery) Neldon Labella, RN as Case Manager Verdia Kuba, [facility] as Pharmacist (Pharmacist)  Indicate any recent  Medical Services you may have received from other than [facility] providers in the past year (date may be approximate).     Assessment:   This is a routine wellness examination for Evangelina.  Hearing/Vision screen  Hearing Screening   125Hz  250Hz  500Hz  1000Hz  2000Hz  3000Hz  4000Hz  6000Hz  8000Hz   Right ear:           Left ear:           Comments: V  Vision Screening Comments: Annual vision screenings with Dr. Matilde Sprang  Dietary issues and exercise activities discussed: Current Exercise Habits: The patient does not participate in regular exercise at present, Exercise limited by: None identified  Goals    . Diabetes - goal A1c < 7%     CARE PLAN ENTRY (see longitudinal plan of care for additional care plan information)  Current Barriers:  . Diabetes: type 2; complicated by chronic medical conditions including hyperlipidemia Lab Results  Component Value Date   HGBA1C 6.2 (H) 02/23/2019 .   Lab Results  Component Value Date   CREATININE 0.89 02/23/2019   CREATININE 0.77 07/22/2018   CREATININE 0.83 10/11/2017 .   Marland Kitchen No results found for: EGFR > 60 ml/min . Current antihyperglycemic regimen: none . Denies hypoglycemic symptoms, including dizziness, lightheadedness, shaking, sweating . Denies hyperglycemic symptoms, including polyuria, polydipsia, polyphagia, nocturia, blurred vision, neuropathy . Current meal patterns: balanced diet, avoids starchy foods . Current exercise: NA . Current blood glucose readings: doesn't check  Pharmacist Clinical Goal(s):  Marland Kitchen Over the next 90 days, patient will work with PharmD and primary care provider to address lifestyle control type 2 diabetes to ensure no medication needed  Interventions: . Comprehensive medication review performed, medication list updated in electronic medical record . Inter-disciplinary care team collaboration (see longitudinal plan of care) . Counseled on goal and reason for no ACE inhibitor or Angiotensin Receptor Blocker  (angioedema) . Counseled on cinnamon not reducing A1c  Patient Self Care Activities:  . Patient will contact provider with any episodes of hypoglycemia . Patient will report any questions or concerns to provider or clinical pharmacist  Follow up goal documentation    . DIET - REDUCE SUGAR INTAKE     Recommend to eliminate sweets and to decrease portion sizes by eating 3 small healthy meals and at least 2 healthy snacks per day.     Marland Kitchen  Exercise 3x per week (20-30 min per time)     Recommend to exercise 3x per week for 20-30 min per time.    . Medication Management - optimize medication list      CARE PLAN ENTRY (see longitudinal plan of care for additional care plan information)  Current Barriers:  . Complex patient with multiple comorbidities including diabetes, hyperlipidemia . Self-manages medications by container Does not use a pill box or other adherence strategies   Pharmacist Clinical Goal(s):  Marland Kitchen Over the next 90 days, patient will work with PharmD and provider towards optimized medication management . Keep medication list updated and trim OTC products to minimum  Interventions: . Comprehensive medication review performed; medication list updated in electronic medical record . Inter-disciplinary care team collaboration (see longitudinal plan of care)  Patient Self Care Activities:  . Patient will take medications as prescribed . Patient will focus on improved adherence by reducing over-the-counter medication use  Initial goal documentation       Depression Screen PHQ 2/9 Scores 10/15/2019 07/22/2019 04/20/2019 03/02/2019 01/26/2019 01/23/2019 10/14/2018  PHQ - 2 Score 0 0 0 0 0 0 0  PHQ- 9 Score - 0 - - - 0 -    Fall Risk Fall Risk  10/15/2019 08/11/2019 07/22/2019 04/20/2019 01/26/2019  Falls in the past year? 0 0 0 0 0  Number falls in past yr: 0 - 0 - -  Injury with Fall? 0 - 0 - -  Risk for fall due to : No Fall Risks - - - -  Risk for fall due to: Comment - - - - -   Follow up Falls prevention discussed - - - Falls prevention discussed    Any stairs in or around the home? Yes  If so, are there any without handrails? No  Home free of loose throw rugs in walkways, pet beds, electrical cords, etc? Yes  Adequate lighting in your home to reduce risk of falls? Yes   ASSISTIVE DEVICES UTILIZED TO PREVENT FALLS:  Life alert? No  Use of a cane, walker or w/c? No  Grab bars in the bathroom? Yes  Shower chair or bench in shower? Yes  Elevated toilet seat or a handicapped toilet? Yes   TIMED UP AND GO:  Was the test performed? No . Telephonic visit  Cognitive Function:     6CIT Screen 10/15/2019 10/14/2018 10/11/2017 10/08/2016  What Year? 0 points 0 points 0 points 0 points  What month? 0 points 0 points 0 points 0 points  What time? 0 points 0 points 0 points 0 points  Count back from 20 0 points 0 points 0 points 0 points  Months in reverse 0 points 0 points 0 points 0 points  Repeat phrase 0 points 2 points 0 points 4 points  Total Score 0 2 0 4    Immunizations Immunization History  Administered Date(s) Administered  . Fluad Quad(high Dose 65+) 02/23/2019  . Influenza, High Dose Seasonal PF 10/07/2014, 10/10/2015, 10/11/2016, 10/11/2017  . Influenza-Unspecified 03/20/2012, 10/01/2013  . Janssen (J&J) SARS-COV-2 Vaccination 03/31/2019  . Pneumococcal Conjugate-13 10/07/2014  . Pneumococcal Polysaccharide-23 07/27/2009, 10/10/2015  . Tdap 07/16/2006  . Zoster 10/19/2009    TDAP status: Due, Education has been provided regarding the importance of this vaccine. Advised may receive this vaccine at local pharmacy or Health Dept. Aware to provide a copy of the vaccination record if obtained from local pharmacy or Health Dept. Verbalized acceptance and understanding.   Flu Vaccine status: Declined, Education  has been provided regarding the importance of this vaccine but patient still declined. Advised may receive this vaccine at local pharmacy or  Health Dept. Aware to provide a copy of the vaccination record if obtained from local pharmacy or Health Dept. Verbalized acceptance and understanding.   Pneumococcal vaccine status: Up to date   Covid-19 vaccine status: Completed vaccines  Qualifies for Shingles Vaccine? Yes   Zostavax completed Yes   Shingrix Completed?: No.    Education has been provided regarding the importance of this vaccine. Patient has been advised to call insurance company to determine out of pocket expense if they have not yet received this vaccine. Advised may also receive vaccine at local pharmacy or Health Dept. Verbalized acceptance and understanding.  Screening Tests Health Maintenance  Topic Date Due  . INFLUENZA VACCINE  08/16/2019  . COLONOSCOPY  08/30/2019  . TETANUS/TDAP  01/23/2020 (Originally 07/15/2016)  . OPHTHALMOLOGY EXAM  12/29/2019  . HEMOGLOBIN A1C  01/22/2020  . URINE MICROALBUMIN  02/23/2020  . MAMMOGRAM  08/02/2020  . FOOT EXAM  10/11/2020  . DEXA SCAN  Completed  . COVID-19 Vaccine  Completed  . Hepatitis C Screening  Completed  . PNA vac Low Risk Adult  Completed    Health Maintenance  Health Maintenance Due  Topic Date Due  . INFLUENZA VACCINE  08/16/2019  . COLONOSCOPY  08/30/2019    Colorectal cancer screening: Completed 09/13/09. Repeat every 10 years   Mammogram status: Completed 08/03/19. Repeat every year   Bone Density status: Completed 04/23/12. Results reflect: Bone density results: NORMAL. Repeat every 2 years. Ordered today.   Lung Cancer Screening: (Low Dose CT Chest recommended if Age 57-80 years, 30 pack-year currently smoking OR have quit w/in 15years.) does not qualify.   Additional Screening:  Hepatitis C Screening: does qualify; Completed 09/28/11  Vision Screening: Recommended annual ophthalmology exams for early detection of glaucoma and other disorders of the eye. Is the patient up to date with their annual eye exam?  Yes  Who is the provider or what is  the name of the office in which the patient attends annual eye exams? Dr. Matilde Sprang  Dental Screening: Recommended annual dental exams for proper oral hygiene  Community Resource Referral / Chronic Care Management: CRR required this visit?  No   CCM required this visit?  No      Plan:     I have personally reviewed and noted the following in the patient's chart:   . Medical and social history . Use of alcohol, tobacco or illicit drugs  . Current medications and supplements . Functional ability and status . Nutritional status . Physical activity . Advanced directives . List of other physicians . Hospitalizations, surgeries, and ER visits in previous 12 months . Vitals . Screenings to include cognitive, depression, and falls . Referrals and appointments  In addition, I have reviewed and discussed with patient certain preventive protocols, quality metrics, and best practice recommendations. A written personalized care plan for preventive services as well as general preventive health recommendations were provided to patient.     Clemetine Marker, LPN   [id number]   Nurse Notes: none

## 2019-10-15 NOTE — Patient Instructions (Signed)
Stacy [name] , Thank you for taking time to come for your Medicare Wellness Visit. I appreciate your ongoing commitment to your health goals. Please review the following plan we discussed and let me know if I can assist you in the future.   Screening recommendations/referrals: Colonoscopy: done 09/13/09. Referral sent to gastroenterology today for repeat screening colonoscopy. They will contact you to scheduled.  Mammogram: done 08/03/19 Bone Density: done 04/23/12. Please call [phone] to schedule your bone density screening.  Recommended yearly ophthalmology/optometry visit for glaucoma screening and checkup Recommended yearly dental visit for hygiene and checkup  Vaccinations: Influenza vaccine: due Pneumococcal vaccine: done 10/10/15 Tdap vaccine: due Shingles vaccine: Shingrix discussed. Please contact your pharmacy for coverage information.  Covid-19: done 03/31/19  Advanced directives: Advance directive discussed with you today. I have provided a copy for you to complete at home and have notarized. Once this is complete please bring a copy in to our office so we can scan it into your chart.  Conditions/risks identified: Recommend increasing physical activity to at least 3 days per week  Next appointment: Follow up in one year for your annual wellness visit    Preventive Care 65 Years and Older, Female Preventive care refers to lifestyle choices and visits with your health care provider that can promote health and wellness. What does preventive care include?  A yearly physical exam. This is also called an annual well check.  Dental exams once or twice a year.  Routine eye exams. Ask your health care provider how often you should have your eyes checked.  Personal lifestyle choices, including:  Daily care of your teeth and gums.  Regular physical activity.  Eating a healthy diet.  Avoiding tobacco and drug use.  Limiting alcohol use.  Practicing safe sex.  Taking  low-dose aspirin every day.  Taking vitamin and mineral supplements as recommended by your health care provider. What happens during an annual well check? The services and screenings done by your health care provider during your annual well check will depend on your age, overall health, lifestyle risk factors, and family history of disease. Counseling  Your health care provider may ask you questions about your:  Alcohol use.  Tobacco use.  Drug use.  Emotional well-being.  Home and relationship well-being.  Sexual activity.  Eating habits.  History of falls.  Memory and ability to understand (cognition).  Work and work Statistician.  Reproductive health. Screening  You may have the following tests or measurements:  Height, weight, and BMI.  Blood pressure.  Lipid and cholesterol levels. These may be checked every 5 years, or more frequently if you are over 69 years old.  Skin check.  Lung cancer screening. You may have this screening every year starting at age 73 if you have a 30-pack-year history of smoking and currently smoke or have quit within the past 15 years.  Fecal occult blood test (FOBT) of the stool. You may have this test every year starting at age 73.  Flexible sigmoidoscopy or colonoscopy. You may have a sigmoidoscopy every 5 years or a colonoscopy every 10 years starting at age 69.  Hepatitis C blood test.  Hepatitis B blood test.  Sexually transmitted disease (STD) testing.  Diabetes screening. This is done by checking your blood sugar (glucose) after you have not eaten for a while (fasting). You may have this done every 1-3 years.  Bone density scan. This is done to screen for osteoporosis. You may have this done starting at age  73.  Mammogram. This may be done every 1-2 years. Talk to your health care provider about how often you should have regular mammograms. Talk with your health care provider about your test results, treatment options, and  if necessary, the need for more tests. Vaccines  Your health care provider may recommend certain vaccines, such as:  Influenza vaccine. This is recommended every year.  Tetanus, diphtheria, and acellular pertussis (Tdap, Td) vaccine. You may need a Td booster every 10 years.  Zoster vaccine. You may need this after age 73.  Pneumococcal 13-valent conjugate (PCV13) vaccine. One dose is recommended after age 73.  Pneumococcal polysaccharide (PPSV23) vaccine. One dose is recommended after age 73. Talk to your health care provider about which screenings and vaccines you need and how often you need them. This information is not intended to replace advice given to you by your health care provider. Make sure you discuss any questions you have with your health care provider. Document Released: 01/28/2015 Document Revised: 09/21/2015 Document Reviewed: 11/02/2014 Elsevier Interactive Patient Education  2017 [place] Prevention in the Home Falls can cause injuries. They can happen to people of all ages. There are many things you can do to make your home safe and to help prevent falls. What can I do on the outside of my home?  Regularly fix the edges of walkways and driveways and fix any cracks.  Remove anything that might make you trip as you walk through a door, such as a raised step or threshold.  Trim any bushes or trees on the path to your home.  Use bright outdoor lighting.  Clear any walking paths of anything that might make someone trip, such as rocks or tools.  Regularly check to see if handrails are loose or broken. Make sure that both sides of any steps have handrails.  Any raised decks and porches should have guardrails on the edges.  Have any leaves, snow, or ice cleared regularly.  Use sand or salt on walking paths during winter.  Clean up any spills in your garage right away. This includes oil or grease spills. What can I do in the bathroom?  Use night  lights.  Install grab bars by the toilet and in the tub and shower. Do not use towel bars as grab bars.  Use non-skid mats or decals in the tub or shower.  If you need to sit down in the shower, use a plastic, non-slip stool.  Keep the floor dry. Clean up any water that spills on the floor as soon as it happens.  Remove soap buildup in the tub or shower regularly.  Attach bath mats securely with double-sided non-slip rug tape.  Do not have throw rugs and other things on the floor that can make you trip. What can I do in the bedroom?  Use night lights.  Make sure that you have a light by your bed that is easy to reach.  Do not use any sheets or blankets that are too big for your bed. They should not hang down onto the floor.  Have a firm chair that has side arms. You can use this for support while you get dressed.  Do not have throw rugs and other things on the floor that can make you trip. What can I do in the kitchen?  Clean up any spills right away.  Avoid walking on wet floors.  Keep items that you use a lot in easy-to-reach places.  If you need  to reach something above you, use a strong step stool that has a grab bar.  Keep electrical cords out of the way.  Do not use floor polish or wax that makes floors slippery. If you must use wax, use non-skid floor wax.  Do not have throw rugs and other things on the floor that can make you trip. What can I do with my stairs?  Do not leave any items on the stairs.  Make sure that there are handrails on both sides of the stairs and use them. Fix handrails that are broken or loose. Make sure that handrails are as long as the stairways.  Check any carpeting to make sure that it is firmly attached to the stairs. Fix any carpet that is loose or worn.  Avoid having throw rugs at the top or bottom of the stairs. If you do have throw rugs, attach them to the floor with carpet tape.  Make sure that you have a light switch at the  top of the stairs and the bottom of the stairs. If you do not have them, ask someone to add them for you. What else can I do to help prevent falls?  Wear shoes that:  Do not have high heels.  Have rubber bottoms.  Are comfortable and fit you well.  Are closed at the toe. Do not wear sandals.  If you use a stepladder:  Make sure that it is fully opened. Do not climb a closed stepladder.  Make sure that both sides of the stepladder are locked into place.  Ask someone to hold it for you, if possible.  Clearly mark and make sure that you can see:  Any grab bars or handrails.  First and last steps.  Where the edge of each step is.  Use tools that help you move around (mobility aids) if they are needed. These include:  Canes.  Walkers.  Scooters.  Crutches.  Turn on the lights when you go into a dark area. Replace any light bulbs as soon as they burn out.  Set up your furniture so you have a clear path. Avoid moving your furniture around.  If any of your floors are uneven, fix them.  If there are any pets around you, be aware of where they are.  Review your medicines with your doctor. Some medicines can make you feel dizzy. This can increase your chance of falling. Ask your doctor what other things that you can do to help prevent falls. This information is not intended to replace advice given to you by your health care provider. Make sure you discuss any questions you have with your health care provider. Document Released: 10/28/2008 Document Revised: 06/09/2015 Document Reviewed: 02/05/2014 Elsevier Interactive Patient Education  2017 [employer].

## 2019-10-23 ENCOUNTER — Encounter: Payer: Self-pay | Admitting: *Deleted

## 2019-11-10 ENCOUNTER — Other Ambulatory Visit: Payer: Self-pay | Admitting: Family Medicine

## 2019-12-16 ENCOUNTER — Telehealth: Payer: Self-pay | Admitting: *Deleted

## 2019-12-16 NOTE — Chronic Care Management (AMB) (Signed)
  Care Management   Note  12/16/2019 Name: Maize Brittingham MRN: [id number] DOB: 1945/05/03  Tristian Bouska is a 74 y.o. year old female who is a primary care patient of Steele Sizer, MD and is actively engaged with the care management team. I reached out to [name] by phone today to assist with scheduling a follow up visit with the Pharmacist.  Follow up plan: Unsuccessful telephone outreach attempt made. A HIPAA compliant phone message was left for the patient providing contact information and requesting a return call. The care management team will reach out to the patient again over the next 7 days. If patient returns call to provider office, please advise to call [place] at [phone].  [place] [facility]

## 2019-12-18 ENCOUNTER — Other Ambulatory Visit: Payer: Self-pay | Admitting: Family Medicine

## 2019-12-18 NOTE — Telephone Encounter (Signed)
Requested Prescriptions  Pending Prescriptions Disp Refills   rosuvastatin (CRESTOR) 5 MG tablet [Pharmacy Med Name: Rosuvastatin Calcium 5 MG Oral Tablet] 36 tablet 0    Sig: TAKE 1 TABLET BY MOUTH THREE TIMES A WEEK     Cardiovascular:  Antilipid - Statins Passed - 12/18/2019 11:43 AM      Passed - Total Cholesterol in normal range and within 360 days    Cholesterol, Total  Date Value Ref Range Status  10/07/2014 174 100 - 199 mg/dL Final   Cholesterol  Date Value Ref Range Status  07/22/2019 137 <200 mg/dL Final         Passed - LDL in normal range and within 360 days    LDL Cholesterol (Calc)  Date Value Ref Range Status  07/22/2019 48 mg/dL (calc) Final    Comment:    Reference range: <100 . Desirable range <100 mg/dL for primary prevention;   <70 mg/dL for patients with CHD or diabetic patients  with > or = 2 CHD risk factors. Marland Kitchen LDL-C is now calculated using the Martin-Hopkins  calculation, which is a validated novel method providing  better accuracy than the Friedewald equation in the  estimation of LDL-C.  Cresenciano Genre et al. Annamaria Helling. 1505;697(94): 2061-2068  (http://education.[employer].com/faq/FAQ164)          Passed - HDL in normal range and within 360 days    HDL  Date Value Ref Range Status  07/22/2019 75 > OR = 50 mg/dL Final  10/07/2014 83 >39 mg/dL Final    Comment:    According to ATP-III Guidelines, HDL-C >59 mg/dL is considered a negative risk factor for CHD.          Passed - Triglycerides in normal range and within 360 days    Triglycerides  Date Value Ref Range Status  07/22/2019 59 <150 mg/dL Final         Passed - Patient is not pregnant      Passed - Valid encounter within last 12 months    Recent Outpatient Visits          4 months ago Diabetes mellitus type 2 in obese [facility])   [facility] Steele Sizer, MD   10 months ago Hypertension, benign   [facility] [place], Drue Stager, MD   1 year  ago Diabetes mellitus type 2 in obese [facility])   [facility] Steele Sizer, MD   1 year ago Hypertension, benign   [facility] [place], Drue Stager, MD   2 years ago Diabetes mellitus type 2 in obese [facility])   [facility] Steele Sizer, MD      Future Appointments            In 1 month Ancil Boozer, Drue Stager, MD [facility], [place]   In 10 months  [facility], [facility]

## 2019-12-21 NOTE — Chronic Care Management (AMB) (Signed)
  Care Management   Note  12/21/2019 Name: Adelyn Roscher MRN: [id number] DOB: [birth year]  Tyjanae Bartek is a 74 y.o. year old female who is a primary care patient of Steele Sizer, MD and is actively engaged with the care management team. I reached out to [name] by phone today to assist with scheduling a follow up visit with the Pharmacist  Follow up plan: Telephone appointment with care management team member scheduled for:01/13/2020  [place] Management

## 2019-12-21 NOTE — Chronic Care Management (AMB) (Signed)
  Care Management   Note  12/21/2019 Name: Louvinia Cumbo MRN: [id number] DOB: [birth year]  Stacy [name] is a 74 y.o. year old female who is a primary care patient of Steele Sizer, MD and is actively engaged with the care management team. I reached out to [name] by phone today to assist with scheduling a follow up visit with the Pharmacist  Follow up plan: Unsuccessful telephone outreach attempt made. A HIPAA compliant phone message was left for the patient providing contact information and requesting a return call. The care management team will reach out to the patient again over the next 7 days. If patient returns call to provider office, please advise to call [place] at [phone].  [place] Management

## 2020-01-11 ENCOUNTER — Ambulatory Visit: Payer: TRICARE For Life (TFL) | Admitting: Podiatry

## 2020-01-13 ENCOUNTER — Other Ambulatory Visit: Payer: Self-pay | Admitting: Family Medicine

## 2020-01-13 ENCOUNTER — Ambulatory Visit: Payer: Medicare PPO

## 2020-01-13 DIAGNOSIS — E1169 Type 2 diabetes mellitus with other specified complication: Secondary | ICD-10-CM

## 2020-01-13 DIAGNOSIS — E119 Type 2 diabetes mellitus without complications: Secondary | ICD-10-CM

## 2020-01-13 DIAGNOSIS — I10 Essential (primary) hypertension: Secondary | ICD-10-CM

## 2020-01-13 NOTE — Chronic Care Management (AMB) (Signed)
Chronic Care Management Pharmacy  Name: Stacy [name]  MRN: [id number] DOB: [birth year]  Chief Complaint/ HPI  Stacy [name],  74 y.o. , female presents for their Follow-Up CCM visit with the clinical pharmacist via telephone.  PCP : Steele Sizer, MD  Their chronic conditions include: DM, HLD, HTN   Allergic rhinitis   Office Visits: 07/22/19: Patient presented to Dr. Ancil Boozer for follow-up.   Consult Visit: 10/12/19: Patient presented to Dr. Prudence Davidson (Podiatry)  08/25/19: Patient presented to [facility] for breast cancer screening.  Medications: Outpatient Encounter Medications as of 01/13/2020  Medication Sig  . Cholecalciferol (VITAMIN D-3 PO) Take 1,000 Units by mouth daily.   . Cinnamon 500 MG capsule Take 1,000 mg by mouth daily.  Marland Kitchen FISH OIL-BORAGE-FLAX-SAFFLOWER PO Take 2 tablets by mouth daily.  . fluticasone (FLONASE) 50 MCG/ACT nasal spray Place 2 sprays into both nostrils daily.  Marland Kitchen ketoconazole (NIZORAL) 2 % cream Apply 1 application topically daily. (Patient taking differently: Apply 1 application topically daily as needed.)  . loratadine (CLARITIN) 10 MG tablet Take 1 tablet (10 mg total) by mouth daily.  . Multiple Vitamins-Minerals (MULTIVITAMIN GUMMIES ADULT PO) Take by mouth daily.  Marland Kitchen olopatadine (PATANOL) 0.1 % ophthalmic solution 1 drop 2 (two) times daily.  Marland Kitchen triamterene-hydrochlorothiazide (MAXZIDE-25) 37.5-25 MG tablet Take 1 tablet by mouth daily.  . [DISCONTINUED] rosuvastatin (CRESTOR) 5 MG tablet TAKE 1 TABLET BY MOUTH THREE TIMES A WEEK   No facility-administered encounter medications on file as of 01/13/2020.   Current Diagnosis/Assessment: SDOH Interventions   Flowsheet Row Most Recent Value  SDOH Interventions   Financial Strain Interventions Intervention Not Indicated  Transportation Interventions Intervention Not Indicated     Goals Addressed            This Visit's Progress   . Chronic Care Management       CARE PLAN  ENTRY (see longitudinal plan of care for additional care plan information)  Current Barriers:  . Chronic Disease Management support, education, and care coordination needs related to Hypertension, Hyperlipidemia, Diabetes, and Allergic Rhinitis   Hypertension BP Readings from Last 3 Encounters:  08/25/19 (!) 132/51  08/11/19 (!) 164/76  07/22/19 132/70   . Pharmacist Clinical Goal(s): o Over the next 90 days, patient will work with PharmD and providers to maintain BP goal <130/80 . Current regimen:  o Triamterene-HCTZ 37.5-25 mg daily  . Interventions: o Discussed low salt diet and exercising as tolerated extensively o Will initiate blood pressure monitoring plan  . Patient self care activities - Over the next 90 days, patient will: o Check Blood Pressure weekly, document, and provide at future appointments o Ensure daily salt intake < 2300 mg/day  Hyperlipidemia Lab Results  Component Value Date/Time   LDLCALC 48 07/22/2019 11:10 AM   . Pharmacist Clinical Goal(s): o Over the next 90 days, patient will work with PharmD and providers to maintain LDL goal < 70 . Current regimen:  o Rosuvastatin 5 mg three times weekly  . Interventions: o Discussed low cholesterol diet and exercising as tolerated extensively o Will initiate cholesterol monitoring plan   Diabetes Lab Results  Component Value Date/Time   HGBA1C 6.1 (A) 07/22/2019 10:20 AM   HGBA1C 6.2 (H) 02/23/2019 09:47 AM   HGBA1C 6.1 (A) 07/22/2018 11:10 AM   HGBA1C 6.3 (H) 10/11/2017 10:58 AM   . Pharmacist Clinical Goal(s): o Over the next 90 days, patient will work with PharmD and providers to maintain A1c goal <6.5% . Current  regimen:  o None . Interventions: o Discussed carbohydrate counting and exercising as tolerated extensively o Will initiate blood sugar monitoring plan  . Patient self care activities - Over the next 90 days, patient will: o Contact provider with any episodes of hypoglycemia  Medication  management . Pharmacist Clinical Goal(s): o Over the next 90 days, patient will work with PharmD and providers to maintain optimal medication adherence . Current pharmacy: [facility] . Interventions o Comprehensive medication review performed. o Continue current medication management strategy . Patient self care activities - Over the next 90 days, patient will: o Take medications as prescribed o Report any questions or concerns to PharmD and/or provider(s)    . COMPLETED: Medication Management - optimize medication list        CARE PLAN ENTRY (see longitudinal plan of care for additional care plan information)  Current Barriers:  . Complex patient with multiple comorbidities including diabetes, hyperlipidemia . Self-manages medications by container Does not use a pill box or other adherence strategies   Pharmacist Clinical Goal(s):  Marland Kitchen Over the next 90 days, patient will work with PharmD and provider towards optimized medication management . Keep medication list updated and trim OTC products to minimum  Interventions: . Comprehensive medication review performed; medication list updated in electronic medical record . Inter-disciplinary care team collaboration (see longitudinal plan of care)  Patient Self Care Activities:  . Patient will take medications as prescribed . Patient will focus on improved adherence by reducing over-the-counter medication use  Initial goal documentation       Hyperlipidemia   LDL goal < 100  Lipid Panel     Component Value Date/Time   CHOL 137 07/22/2019 1110   CHOL 174 10/07/2014 1013   TRIG 59 07/22/2019 1110   HDL 75 07/22/2019 1110   HDL 83 10/07/2014 1013   LDLCALC 48 07/22/2019 1110    Hepatic Function Latest Ref Rng & Units 07/22/2019 02/23/2019 07/22/2018  Total Protein 6.1 - 8.1 g/dL 7.4 7.2 6.9  Albumin 3.6 - 5.1 g/dL - - -  AST 10 - 35 U/L 17 14 17   ALT 6 - 29 U/L 19 14 18   Alk Phosphatase 33 - 130 U/L - - -  Total Bilirubin 0.2 -  1.2 mg/dL 0.4 0.4 0.5     The 10-year ASCVD risk score Mikey Bussing DC Jr., et al., 2013) is: 25.1%   Values used to calculate the score:     Age: 89 years     Sex: Female     Is Non-Hispanic African American: Yes     Diabetic: Yes     Tobacco smoker: No     Systolic Blood Pressure: 709 mmHg     Is BP treated: Yes     HDL Cholesterol: 75 mg/dL     Total Cholesterol: 137 mg/dL   Patient has failed these meds in past: NA Patient is currently controlled on the following medications:  . Rosuvastatin 31m three times weekly (Mon,Wed, Fri)   We discussed: Discussed importance of continued adherence to rosuvastatin to minimize risk of CV disease   Plan  Continue current medications   Diabetes   Recent Relevant Labs: Lab Results  Component Value Date/Time   HGBA1C 6.1 (A) 07/22/2019 10:20 AM   HGBA1C 6.2 (H) 02/23/2019 09:47 AM   HGBA1C 6.1 (A) 07/22/2018 11:10 AM   HGBA1C 6.3 (H) 10/11/2017 10:58 AM   MICROALBUR 0.3 02/23/2019 09:47 AM   MICROALBUR 0.2 10/11/2017 10:58 AM   MICROALBUR Neg 10/11/2016  08:28 AM   MICROALBUR negative 10/10/2015 08:39 AM     Checking BG: Rarely  Recent FBG Readings: NA Patient is currently controlled on the following medications:   Cinnamon 1000 mg capsules daily   Last diabetic Foot exam:  Lab Results  Component Value Date/Time   HMDIABEYEEXA No Retinopathy 12/29/2018 12:00 AM    Last diabetic Eye exam: No results found for: HMDIABFOOTEX   We discussed:  Plan  Continue control with diet and exercise   Hypertension   BP goal is:  <140/90  Office blood pressures are  BP Readings from Last 3 Encounters:  08/25/19 (!) 132/51  08/11/19 (!) 164/76  07/22/19 132/70   Lab Results  Component Value Date   CREATININE 0.90 07/22/2019   BUN 13 07/22/2019   GFRNONAA 64 07/22/2019   GFRAA 74 07/22/2019   NA 141 07/22/2019   K 4.9 08/03/2019   CALCIUM 10.3 07/22/2019   CO2 34 (H) 07/22/2019     Patient checks BP at home  infrequently Patient home BP readings are ranging: NA  Patient has failed these meds in the past: KCl Patient is currently controlled on the following medications:  . Triamterene-HCTZ 37.5/25mg daily  We discussed: Blood pressure stable, denies dizziness, instability or headaches.  Plan  Continue current medications   Allergic Rhinitis   Patient has failed these meds in past: NA Patient is currently controlled on the following medications:  . Flonase 50 mcg/act 2 spray daily  . Loratadine 10 mg daily   We discussed:  NA  Plan  Continue current medications  Misc / OTC    . Vitamin D3 1000 units daily  . Fish Oil 2 tablets daily  . Ketoconazole 2% cream  . Multivitamin daily  . Olopatadine 0.1% solution 1 drop twice daily   Plan  Continue current medications  Medication Management   Pt uses [facility] for all medications Uses pill box? Yes Pt endorses 100% compliance   Plan  Continue current medication management strategy  Follow up: 6 month phone visit  [facility] [phone]

## 2020-01-14 ENCOUNTER — Encounter: Payer: Self-pay | Admitting: Podiatry

## 2020-01-14 ENCOUNTER — Ambulatory Visit (INDEPENDENT_AMBULATORY_CARE_PROVIDER_SITE_OTHER): Payer: Medicare PPO | Admitting: Podiatry

## 2020-01-14 ENCOUNTER — Other Ambulatory Visit: Payer: Self-pay

## 2020-01-14 ENCOUNTER — Other Ambulatory Visit: Payer: Self-pay | Admitting: Family Medicine

## 2020-01-14 DIAGNOSIS — B351 Tinea unguium: Secondary | ICD-10-CM | POA: Diagnosis not present

## 2020-01-14 DIAGNOSIS — E119 Type 2 diabetes mellitus without complications: Secondary | ICD-10-CM

## 2020-01-14 DIAGNOSIS — E1169 Type 2 diabetes mellitus with other specified complication: Secondary | ICD-10-CM | POA: Insufficient documentation

## 2020-01-14 DIAGNOSIS — M79675 Pain in left toe(s): Secondary | ICD-10-CM | POA: Diagnosis not present

## 2020-01-14 DIAGNOSIS — M79674 Pain in right toe(s): Secondary | ICD-10-CM | POA: Diagnosis not present

## 2020-01-14 MED ORDER — ROSUVASTATIN CALCIUM 5 MG PO TABS: ORAL_TABLET | ORAL | 0 refills | Status: DC

## 2020-01-14 NOTE — Patient Instructions (Signed)
Visit Information It was great speaking with you today!  Please let me know if you have any questions about our visit. Goals Addressed            This Visit's Progress    Chronic Care Management       CARE PLAN ENTRY (see longitudinal plan of care for additional care plan information)  Current Barriers:   Chronic Disease Management support, education, and care coordination needs related to Hypertension, Hyperlipidemia, Diabetes, and Allergic Rhinitis   Hypertension BP Readings from Last 3 Encounters:  08/25/19 (!) 132/51  08/11/19 (!) 164/76  07/22/19 132/70    Pharmacist Clinical Goal(s): o Over the next 90 days, patient will work with PharmD and providers to maintain BP goal <130/80  Current regimen:  o Triamterene-HCTZ 37.5-25 mg daily   Interventions: o Discussed low salt diet and exercising as tolerated extensively o Will initiate blood pressure monitoring plan   Patient self care activities - Over the next 90 days, patient will: o Check Blood Pressure weekly, document, and provide at future appointments o Ensure daily salt intake < 2300 mg/day  Hyperlipidemia Lab Results  Component Value Date/Time   LDLCALC 48 07/22/2019 11:10 AM    Pharmacist Clinical Goal(s): o Over the next 90 days, patient will work with PharmD and providers to maintain LDL goal < 70  Current regimen:  o Rosuvastatin 5 mg three times weekly   Interventions: o Discussed low cholesterol diet and exercising as tolerated extensively o Will initiate cholesterol monitoring plan   Diabetes Lab Results  Component Value Date/Time   HGBA1C 6.1 (A) 07/22/2019 10:20 AM   HGBA1C 6.2 (H) 02/23/2019 09:47 AM   HGBA1C 6.1 (A) 07/22/2018 11:10 AM   HGBA1C 6.3 (H) 10/11/2017 10:58 AM    Pharmacist Clinical Goal(s): o Over the next 90 days, patient will work with PharmD and providers to maintain A1c goal <6.5%  Current regimen:  o None  Interventions: o Discussed carbohydrate counting and  exercising as tolerated extensively o Will initiate blood sugar monitoring plan   Patient self care activities - Over the next 90 days, patient will: o Contact provider with any episodes of hypoglycemia  Medication management  Pharmacist Clinical Goal(s): o Over the next 90 days, patient will work with PharmD and providers to maintain optimal medication adherence  Current pharmacy: [facility]  Interventions o Comprehensive medication review performed. o Continue current medication management strategy  Patient self care activities - Over the next 90 days, patient will: o Take medications as prescribed o Report any questions or concerns to PharmD and/or provider(s)     COMPLETED: Medication Management - optimize medication list        CARE PLAN ENTRY (see longitudinal plan of care for additional care plan information)  Current Barriers:   Complex patient with multiple comorbidities including diabetes, hyperlipidemia  Self-manages medications by container Does not use a pill box or other adherence strategies   Pharmacist Clinical Goal(s):   Over the next 90 days, patient will work with PharmD and provider towards optimized medication management  Keep medication list updated and trim OTC products to minimum  Interventions:  Comprehensive medication review performed; medication list updated in electronic medical record  Inter-disciplinary care team collaboration (see longitudinal plan of care)  Patient Self Care Activities:   Patient will take medications as prescribed  Patient will focus on improved adherence by reducing over-the-counter medication use  Initial goal documentation        The patient verbalized understanding  of instructions, educational materials, and care plan provided today and declined offer to receive copy of patient instructions, educational materials, and care plan.   Telephone follow up appointment with pharmacy team member scheduled for:  07/13/20 at 9:00 AM  [facility] [phone]

## 2020-01-14 NOTE — Progress Notes (Signed)

## 2020-01-18 NOTE — Progress Notes (Signed)
Thank you!   Garey Ham Clinical Pharmacist [facility] [phone]

## 2020-01-22 ENCOUNTER — Telehealth: Payer: Self-pay

## 2020-01-22 ENCOUNTER — Encounter: Payer: Self-pay | Admitting: Family Medicine

## 2020-01-22 ENCOUNTER — Ambulatory Visit (INDEPENDENT_AMBULATORY_CARE_PROVIDER_SITE_OTHER): Payer: Medicare PPO | Admitting: Family Medicine

## 2020-01-22 ENCOUNTER — Other Ambulatory Visit: Payer: Self-pay

## 2020-01-22 VITALS — BP 128/72 | HR 86 | Temp 98.1°F | Resp 16 | Ht 67.0 in | Wt 203.9 lb

## 2020-01-22 DIAGNOSIS — B351 Tinea unguium: Secondary | ICD-10-CM

## 2020-01-22 DIAGNOSIS — E119 Type 2 diabetes mellitus without complications: Secondary | ICD-10-CM | POA: Diagnosis not present

## 2020-01-22 DIAGNOSIS — E785 Hyperlipidemia, unspecified: Secondary | ICD-10-CM | POA: Diagnosis not present

## 2020-01-22 DIAGNOSIS — Z1211 Encounter for screening for malignant neoplasm of colon: Secondary | ICD-10-CM | POA: Diagnosis not present

## 2020-01-22 DIAGNOSIS — Z23 Encounter for immunization: Secondary | ICD-10-CM | POA: Diagnosis not present

## 2020-01-22 DIAGNOSIS — D72819 Decreased white blood cell count, unspecified: Secondary | ICD-10-CM

## 2020-01-22 DIAGNOSIS — E1159 Type 2 diabetes mellitus with other circulatory complications: Secondary | ICD-10-CM

## 2020-01-22 DIAGNOSIS — I152 Hypertension secondary to endocrine disorders: Secondary | ICD-10-CM | POA: Diagnosis not present

## 2020-01-22 DIAGNOSIS — I10 Essential (primary) hypertension: Secondary | ICD-10-CM

## 2020-01-22 DIAGNOSIS — E1169 Type 2 diabetes mellitus with other specified complication: Secondary | ICD-10-CM | POA: Diagnosis not present

## 2020-01-22 LAB — POCT GLYCOSYLATED HEMOGLOBIN (HGB A1C): Hemoglobin A1C: 6 % — AB (ref 4.0–5.6)

## 2020-01-22 MED ORDER — ROSUVASTATIN CALCIUM 5 MG PO TABS: ORAL_TABLET | ORAL | 0 refills | Status: DC

## 2020-01-22 NOTE — Telephone Encounter (Signed)
FYI: Pt stated that her surgical doctor told her that she no longer have to go through them to get her mammogram setup. Stated her pcp is now able to make the appt for her

## 2020-01-22 NOTE — Progress Notes (Signed)
Name: Stacy [name]   MRN: [id number]    DOB: [birth year]   Date:01/22/2020       Progress Note  Subjective  Chief Complaint  Chief Complaint  Patient presents with  . Diabetes  . Hypertension  . Hyperlipidemia    HPI  DMII: doing well, on diet only, can't take ACE caused angioedema. Denies polyphagia, polydipsia or polyuria. She is following a diabetic diet. She has been compliant with Crestor 5 mg M, W and Fridays , last LDL was at goal. A1C today was 6.0%, eye exam it up to date . She has onychomycosis and gets nail trimmed by podiatrist - had a recent visit 12/2019   HTN: taking diuretic, tolerating it well. Denies chest pain, dizziness, palpitation, orthopneaor SOB,lower extremity swelling that has improved with compression stocking hoses. She was on Triamterene hctz 37/25 daily.   Obesity: she has DM and HTN, discussed life style modification.Weight 07/2018 was 193 lbs today it is  Up to 203.9lbs. Discussed regular physical activity   Vitamin D deficiency: continue supplementation, reviewed last labs   AR: she only taking medication prn, currently no rhinorrhea, nasal congestion or rhinorrhea.   Patient Active Problem List   Diagnosis Date Noted  . Hyperlipidemia associated with type 2 diabetes mellitus ([place]) 01/14/2020  . Onychomycosis of multiple toenails with type 2 diabetes mellitus ([place]) 01/23/2019  . Pain due to onychomycosis of toenails of both feet 10/06/2018  . Diabetes mellitus without complication ([place]) 61/44/3154  . Leg edema 12/11/2016  . Abnormal EKG 12/10/2016  . Radial scar of breast 05/30/2015  . History of foot surgery 12/15/2014  . Hypertension, benign 10/07/2014  . Seasonal allergies 10/07/2014  . Diabetes mellitus type 2, diet-controlled ([place]) 10/07/2014  . Vitamin D deficiency 10/07/2014  . Varicose veins 10/07/2014  . Left Achilles tendinitis 10/07/2014  . Family history of breast cancer 05/12/2012  . 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 BREAST BIOPSY Right 2015   3 stereo biopsies. 2 benign. one was radial scar, no excision done for radial scar  . BREAST BIOPSY Left 07/16/2017   BIPHASIC STROMAL AND EPITHELIAL LESION./ Dr Bary Castilla  . BREAST EXCISIONAL BIOPSY Left 1968   x 3 per pt  . BREAST EXCISIONAL BIOPSY Right 2007   benign  . COLONOSCOPY  2011   DR,ISHAKIS  . FOOT SURGERY  2004  . HEEL SPUR SURGERY Left 12/15/2014   Dr. Earleen Newport at [facility]    Family History  Problem Relation Age of Onset  . Breast cancer Sister 20  . Arthritis Mother   . Stroke Paternal Grandmother   . Cancer Brother 50       kidney  . Cancer Maternal Grandmother        Gallbladder    Social History   Tobacco Use  . Smoking status: Never Smoker  . Smokeless tobacco: Never Used  . Tobacco comment: smoking cessation materials not required  Substance Use Topics  . Alcohol use: No    Alcohol/week: 0.0 standard drinks     Current Outpatient Medications:  .  Cholecalciferol (VITAMIN D-3 PO), Take 1,000 Units by mouth daily. , Disp: , Rfl:  .  Cinnamon 500 MG capsule, Take 1,000 mg by mouth daily., Disp: , Rfl:  .  FISH OIL-BORAGE-FLAX-SAFFLOWER PO, Take 2 tablets by mouth daily., Disp: , Rfl:  .  fluticasone (FLONASE) 50 MCG/ACT nasal spray, Place 2 sprays into both  nostrils daily., Disp: 48 g, Rfl: 1 .  ketoconazole (NIZORAL) 2 % cream, Apply 1 application topically daily. (Patient taking differently: Apply 1 application topically daily as needed.), Disp: 60 g, Rfl: 0 .  loratadine (CLARITIN) 10 MG tablet, Take 1 tablet (10 mg total) by mouth daily., Disp: 90 tablet, Rfl: 1 .  Multiple Vitamins-Minerals (MULTIVITAMIN GUMMIES ADULT PO), Take by mouth daily., Disp: , Rfl:  .  olopatadine (PATANOL) 0.1 % ophthalmic solution, 1 drop 2 (two) times daily., Disp: , Rfl:  .  rosuvastatin (CRESTOR) 5 MG tablet, TAKE 1 TABLET BY MOUTH THREE TIMES A WEEK, Disp:  36 tablet, Rfl: 0 .  triamterene-hydrochlorothiazide (MAXZIDE-25) 37.5-25 MG tablet, Take 1 tablet by mouth daily., Disp: , Rfl:   Allergies  Allergen Reactions  . Altace [Ramipril] Swelling    I personally reviewed active problem list, medication list, allergies, family history, social history, health maintenance with the patient/caregiver today.   ROS  Constitutional: Negative for fever or weight change.  Respiratory: Negative for cough and shortness of breath.   Cardiovascular: Negative for chest pain or palpitations.  Gastrointestinal: Negative for abdominal pain, no bowel changes.  Musculoskeletal: Negative for gait problem or joint swelling.  Skin: Negative for rash.  Neurological: Negative for dizziness or headache.  No other specific complaints in a complete review of systems (except as listed in HPI above).  Objective  Vitals:   01/22/20 1048  BP: 128/72  Pulse: 86  Resp: 16  Temp: 98.1 F (36.7 C)  TempSrc: Oral  SpO2: 95%  Weight: 203 lb 14.4 oz (92.5 kg)  Height: 5\' 7"  (1.702 m)    Body mass index is 31.94 kg/m.  Physical Exam  Constitutional: Patient appears well-developed and well-nourished. Obese  No distress.  HEENT: head atraumatic, normocephalic, pupils equal and reactive to light,  neck supple Cardiovascular: Normal rate, regular rhythm and normal heart sounds.  No murmur heard. 2 plus  BLE edema. Pulmonary/Chest: Effort normal and breath sounds normal. No respiratory distress. Abdominal: Soft.  There is no tenderness. Psychiatric: Patient has a normal mood and affect. behavior is normal. Judgment and thought content normal.  Recent Results (from the past 2160 hour(s))  POCT HgB A1C     Status: Abnormal   Collection Time: 01/22/20 11:04 AM  Result Value Ref Range   Hemoglobin A1C 6.0 (A) 4.0 - 5.6 %   HbA1c POC (<> result, manual entry)     HbA1c, POC (prediabetic range)     HbA1c, POC (controlled diabetic range)         PHQ2/9: Depression screen [facility] 2/9 01/22/2020 10/15/2019 07/22/2019 04/20/2019 03/02/2019  Decreased Interest 0 0 0 0 0  Down, Depressed, Hopeless 0 0 0 0 0  PHQ - 2 Score 0 0 0 0 0  Altered sleeping - - 0 - -  Tired, decreased energy - - 0 - -  Change in appetite - - 0 - -  Feeling bad or failure about yourself  - - 0 - -  Trouble concentrating - - 0 - -  Moving slowly or fidgety/restless - - 0 - -  Suicidal thoughts - - 0 - -  PHQ-9 Score - - 0 - -  Difficult doing work/chores - - - - -    phq 9 is negative   Fall Risk: Fall Risk  01/22/2020 10/15/2019 08/11/2019 07/22/2019 04/20/2019  Falls in the past year? 0 0 0 0 0  Number falls in past yr: 0 0 - 0 -  Injury with Fall? 0 0 - 0 -  Risk for fall due to : - No Fall Risks - - -  Risk for fall due to: Comment - - - - -  Follow up Falls evaluation completed Falls prevention discussed - - -      Functional Status Survey: Is the patient deaf or have difficulty hearing?: No Does the patient have difficulty seeing, even when wearing glasses/contacts?: No Does the patient have difficulty concentrating, remembering, or making decisions?: No Does the patient have difficulty walking or climbing stairs?: No Does the patient have difficulty dressing or bathing?: No Does the patient have difficulty doing errands alone such as visiting a doctor's office or shopping?: No    Assessment & Plan  1. Diabetes mellitus type 2, diet-controlled (HCC)  - POCT HgB A1C  2. Need for influenza vaccination  - Flu Vaccine QUAD High Dose(Fluad)  3. Colon cancer screening  - Fecal Globin By Immunochemistry  4. Hypertension, benign   5. Hyperlipidemia associated with type 2 diabetes mellitus (HCC)  - rosuvastatin (CRESTOR) 5 MG tablet; TAKE 1 TABLET BY MOUTH THREE TIMES A WEEK  Dispense: 36 tablet; Refill: 0  6. Leukopenia, unspecified type   7. Hypertension associated with type 2 diabetes mellitus ([place])   8. Onychomycosis of multiple  toenails with type 2 diabetes mellitus ([place])

## 2020-01-25 DIAGNOSIS — Z7984 Long term (current) use of oral hypoglycemic drugs: Secondary | ICD-10-CM | POA: Diagnosis not present

## 2020-01-25 DIAGNOSIS — H353131 Nonexudative age-related macular degeneration, bilateral, early dry stage: Secondary | ICD-10-CM | POA: Diagnosis not present

## 2020-01-25 DIAGNOSIS — E119 Type 2 diabetes mellitus without complications: Secondary | ICD-10-CM | POA: Diagnosis not present

## 2020-01-25 DIAGNOSIS — Z1211 Encounter for screening for malignant neoplasm of colon: Secondary | ICD-10-CM | POA: Diagnosis not present

## 2020-01-25 DIAGNOSIS — H04123 Dry eye syndrome of bilateral lacrimal glands: Secondary | ICD-10-CM | POA: Diagnosis not present

## 2020-01-25 DIAGNOSIS — H25013 Cortical age-related cataract, bilateral: Secondary | ICD-10-CM | POA: Diagnosis not present

## 2020-01-25 DIAGNOSIS — H2513 Age-related nuclear cataract, bilateral: Secondary | ICD-10-CM | POA: Diagnosis not present

## 2020-01-25 LAB — HM DIABETES EYE EXAM

## 2020-01-26 ENCOUNTER — Encounter: Payer: Self-pay | Admitting: Family Medicine

## 2020-01-28 LAB — FECAL GLOBIN BY IMMUNOCHEMISTRY
FECAL GLOBIN RESULT:: NOT DETECTED
MICRO NUMBER:: 11409784
SPECIMEN QUALITY:: ADEQUATE

## 2020-02-26 ENCOUNTER — Telehealth: Payer: Self-pay

## 2020-02-26 NOTE — Progress Notes (Signed)
Chronic Care Management Pharmacy Assistant   Name: Stacy [name]  MRN: [id number] DOB: [birth year]  Reason for Encounter:Hypertension  Disease State  Call.  Patient Questions:  1.  Have you seen any other providers since your last visit? Yes, 01/14/2020 Podiatry Stacy [name], 01/22/2020 PCP Stacy [name]  2.  Any changes in your medicines or health? No    PCP : Stacy Sizer, MD  Allergies:   Allergies  Allergen Reactions  . Altace [Ramipril] Swelling    Medications: Outpatient Encounter Medications as of 02/26/2020  Medication Sig  . Cholecalciferol (VITAMIN D-3 PO) Take 1,000 Units by mouth daily.   . Cinnamon 500 MG capsule Take 1,000 mg by mouth daily.  Marland Kitchen FISH OIL-BORAGE-FLAX-SAFFLOWER PO Take 2 tablets by mouth daily.  . fluticasone (FLONASE) 50 MCG/ACT nasal spray Place 2 sprays into both nostrils daily.  Marland Kitchen ketoconazole (NIZORAL) 2 % cream Apply 1 application topically daily. (Patient taking differently: Apply 1 application topically daily as needed.)  . loratadine (CLARITIN) 10 MG tablet Take 1 tablet (10 mg total) by mouth daily.  . Multiple Vitamins-Minerals (MULTIVITAMIN GUMMIES ADULT PO) Take by mouth daily.  Marland Kitchen olopatadine (PATANOL) 0.1 % ophthalmic solution 1 drop 2 (two) times daily.  . rosuvastatin (CRESTOR) 5 MG tablet TAKE 1 TABLET BY MOUTH THREE TIMES A WEEK  . triamterene-hydrochlorothiazide (MAXZIDE-25) 37.5-25 MG tablet Take 1 tablet by mouth daily.   No facility-administered encounter medications on file as of 02/26/2020.    Current Diagnosis: Patient Active Problem List   Diagnosis Date Noted  . Hyperlipidemia associated with type 2 diabetes mellitus ([place]) 01/14/2020  . Onychomycosis of multiple toenails with type 2 diabetes mellitus ([place]) 01/23/2019  . Pain due to onychomycosis of toenails of both feet 10/06/2018  . Diabetes mellitus without complication ([place]) 19/50/9326  . Leg edema 12/11/2016  . Abnormal EKG 12/10/2016  . Radial scar of  breast 05/30/2015  . History of foot surgery 12/15/2014  . Hypertension, benign 10/07/2014  . Seasonal allergies 10/07/2014  . Diabetes mellitus type 2, diet-controlled ([name]) 10/07/2014  . Vitamin D deficiency 10/07/2014  . Varicose veins 10/07/2014  . Left Achilles tendinitis 10/07/2014  . Family history of breast cancer 05/12/2012  . 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    Goals Addressed   None    Reviewed chart prior to disease state call. Spoke with patient regarding BP  Recent Office Vitals: BP Readings from Last 3 Encounters:  01/22/20 128/72  08/25/19 (!) 132/51  08/11/19 (!) 164/76   Pulse Readings from Last 3 Encounters:  01/22/20 86  08/25/19 80  08/11/19 80    Wt Readings from Last 3 Encounters:  01/22/20 203 lb 14.4 oz (92.5 kg)  08/25/19 200 lb (90.7 kg)  08/11/19 198 lb (89.8 kg)     Kidney Function Lab Results  Component Value Date/Time   CREATININE 0.90 07/22/2019 11:10 AM   CREATININE 0.89 02/23/2019 09:47 AM   GFRNONAA 64 07/22/2019 11:10 AM   GFRAA 74 07/22/2019 11:10 AM    BMP Latest Ref Rng & Units 08/03/2019 07/22/2019 02/23/2019  Glucose 65 - 99 mg/dL - 101(H) 107(H)  BUN 7 - 25 mg/dL - 13 21  Creatinine 0.60 - 0.93 mg/dL - 0.90 0.89  BUN/Creat Ratio 6 - 22 (calc) - NOT APPLICABLE NOT APPLICABLE  Sodium 712 - 146 mmol/L - 141 141  Potassium 3.5 - 5.3 mmol/L 4.9 5.7(H) 4.9  Chloride 98 - 110 mmol/L - 103 105  CO2 20 -  32 mmol/L - 34(H) 30  Calcium 8.6 - 10.4 mg/dL - 10.3 9.8    . Current antihypertensive regimen:  ? Triamterene-HCTZ 37.5-25 mg daily  . How often are you checking your Blood Pressure? Patient states she does not check her blood pressure at home. . Current home BP readings: None ID . What recent interventions/DTPs have been made by any provider to improve Blood Pressure control since last CPP Visit: None ID . Any recent hospitalizations or ED visits since last visit with CPP? No . What diet changes have been  made to improve Blood Pressure Control?  o Patient states she does a low sodium diet. . What exercise is being done to improve your Blood Pressure Control?  o Patient reports she does not exercise like she should.  Adherence Review: Is the patient currently on ACE/ARB medication? No Does the patient have >5 day gap between last estimated fill dates? Yes  Performed cost analysis for patient, estimated yearly medication cost of  Around $81.00.   Stacy [name]  Follow-Up:  Pharmacist Review   Stacy [name] Pharmacist Assistant [phone] (40 Minutes Spent)

## 2020-03-21 DIAGNOSIS — M1712 Unilateral primary osteoarthritis, left knee: Secondary | ICD-10-CM | POA: Diagnosis not present

## 2020-03-21 DIAGNOSIS — M25562 Pain in left knee: Secondary | ICD-10-CM | POA: Diagnosis not present

## 2020-03-31 DIAGNOSIS — H2513 Age-related nuclear cataract, bilateral: Secondary | ICD-10-CM | POA: Diagnosis not present

## 2020-03-31 DIAGNOSIS — H25013 Cortical age-related cataract, bilateral: Secondary | ICD-10-CM | POA: Diagnosis not present

## 2020-03-31 DIAGNOSIS — H04123 Dry eye syndrome of bilateral lacrimal glands: Secondary | ICD-10-CM | POA: Diagnosis not present

## 2020-03-31 DIAGNOSIS — E119 Type 2 diabetes mellitus without complications: Secondary | ICD-10-CM | POA: Diagnosis not present

## 2020-03-31 DIAGNOSIS — S0502XA Injury of conjunctiva and corneal abrasion without foreign body, left eye, initial encounter: Secondary | ICD-10-CM | POA: Diagnosis not present

## 2020-03-31 DIAGNOSIS — H353131 Nonexudative age-related macular degeneration, bilateral, early dry stage: Secondary | ICD-10-CM | POA: Diagnosis not present

## 2020-03-31 DIAGNOSIS — Z7984 Long term (current) use of oral hypoglycemic drugs: Secondary | ICD-10-CM | POA: Diagnosis not present

## 2020-04-05 ENCOUNTER — Telehealth: Payer: Self-pay

## 2020-04-05 NOTE — Progress Notes (Signed)
    Chronic Care Management Pharmacy Assistant   Name: Stacy [name]  MRN: [id number] DOB: April 07, 1945  Reason for Encounter: Medication Review/General Adherence Call.   Recent office visits:  None ID  Recent consult visits:  None ID  Hospital visits:  None in previous 6 months  Medications: Outpatient Encounter Medications as of 04/05/2020  Medication Sig  . Cholecalciferol (VITAMIN D-3 PO) Take 1,000 Units by mouth daily.   . Cinnamon 500 MG capsule Take 1,000 mg by mouth daily.  Marland Kitchen FISH OIL-BORAGE-FLAX-SAFFLOWER PO Take 2 tablets by mouth daily.  . fluticasone (FLONASE) 50 MCG/ACT nasal spray Place 2 sprays into both nostrils daily.  Marland Kitchen ketoconazole (NIZORAL) 2 % cream Apply 1 application topically daily. (Patient taking differently: Apply 1 application topically daily as needed.)  . loratadine (CLARITIN) 10 MG tablet Take 1 tablet (10 mg total) by mouth daily.  . Multiple Vitamins-Minerals (MULTIVITAMIN GUMMIES ADULT PO) Take by mouth daily.  Marland Kitchen olopatadine (PATANOL) 0.1 % ophthalmic solution 1 drop 2 (two) times daily.  . rosuvastatin (CRESTOR) 5 MG tablet TAKE 1 TABLET BY MOUTH THREE TIMES A WEEK  . triamterene-hydrochlorothiazide (MAXZIDE-25) 37.5-25 MG tablet Take 1 tablet by mouth daily.   No facility-administered encounter medications on file as of 04/05/2020.    Star Rating Drugs:rosuvastatin  Called patient and discussed medication adherence  with patient, no issues at this time with current medication.   Patient denies ED visit since her last CPP follow up.  Patient denies any side effects with her medication. Patient denies any problems with her current pharmacy  Patient reports she is doing great with no issues at this time.  Performed cost analysis for patient, estimated yearly medication cost of  Around $81.00.  [place] Pharmacist Assistant [phone]

## 2020-04-07 ENCOUNTER — Encounter: Payer: Self-pay | Admitting: Podiatry

## 2020-04-07 ENCOUNTER — Other Ambulatory Visit: Payer: Self-pay

## 2020-04-07 ENCOUNTER — Ambulatory Visit (INDEPENDENT_AMBULATORY_CARE_PROVIDER_SITE_OTHER): Payer: Medicare PPO | Admitting: Podiatry

## 2020-04-07 DIAGNOSIS — M79675 Pain in left toe(s): Secondary | ICD-10-CM

## 2020-04-07 DIAGNOSIS — B351 Tinea unguium: Secondary | ICD-10-CM

## 2020-04-07 DIAGNOSIS — E119 Type 2 diabetes mellitus without complications: Secondary | ICD-10-CM | POA: Diagnosis not present

## 2020-04-07 DIAGNOSIS — M79674 Pain in right toe(s): Secondary | ICD-10-CM

## 2020-04-07 NOTE — Progress Notes (Signed)
This patient returns to my office for at risk foot care.  This patient requires this care by a professional since this patient will be at risk due to having diabetes.  This patient is unable to cut nails herself since the patient cannot reach her nails.These nails are painful walking and wearing shoes.  This patient presents for at risk foot care today.  General Appearance  Alert, conversant and in no acute stress.  Vascular  Dorsalis pedis and posterior tibial  pulses are palpable  bilaterally.  Capillary return is within normal limits  bilaterally. Temperature is within normal limits  bilaterally.  Neurologic  Senn-Weinstein monofilament wire test within normal limits  bilaterally. Muscle power within normal limits bilaterally.  Nails Thick disfigured discolored nails with subungual debris  from hallux to fifth toes bilaterally. No evidence of bacterial infection or drainage bilaterally.  Orthopedic  No limitations of motion  feet .  No crepitus or effusions noted.  No bony pathology or digital deformities noted.  Skin  normotropic skin with no porokeratosis noted bilaterally.  No signs of infections or ulcers noted.     Onychomycosis  Pain in right toes  Pain in left toes  Consent was obtained for treatment procedures.   Mechanical debridement of nails 1-5  bilaterally performed with a nail nipper.  Filed with dremel without incident. No infection or ulcer.     Return office visit    10 weeks                 Told patient to return for periodic foot care and evaluation due to potential at risk complications.   Gyasi Hazzard DPM  

## 2020-04-25 DIAGNOSIS — M1712 Unilateral primary osteoarthritis, left knee: Secondary | ICD-10-CM | POA: Diagnosis not present

## 2020-04-26 NOTE — Progress Notes (Signed)
Name: Stacy [name]   MRN: [id number]    DOB: 06-17-1945   Date:04/27/2020       Progress Note  Subjective  Chief Complaint  Annual Exam  HPI  Patient presents for annual CPE.  Diet: cooks mostly at home, eat out very seldom.  Exercise: not currently having worsening of knee pain, seen by Ortho and may need surgery , and has not been very active lately   [place] Visit from 01/22/2020 in [facility]  AUDIT-C Score 0     Depression: Phq 9 is  negative Depression screen [facility] 2/9 04/27/2020 01/22/2020 10/15/2019 07/22/2019 04/20/2019  Decreased Interest 0 0 0 0 0  Down, Depressed, Hopeless 0 0 0 0 0  PHQ - 2 Score 0 0 0 0 0  Altered sleeping - - - 0 -  Tired, decreased energy - - - 0 -  Change in appetite - - - 0 -  Feeling bad or failure about yourself  - - - 0 -  Trouble concentrating - - - 0 -  Moving slowly or fidgety/restless - - - 0 -  Suicidal thoughts - - - 0 -  PHQ-9 Score - - - 0 -  Difficult doing work/chores - - - - -   Hypertension: BP Readings from Last 3 Encounters:  04/27/20 132/84  01/22/20 128/72  08/25/19 (!) 132/51   Obesity: Wt Readings from Last 3 Encounters:  04/27/20 195 lb (88.5 kg)  01/22/20 203 lb 14.4 oz (92.5 kg)  08/25/19 200 lb (90.7 kg)   BMI Readings from Last 3 Encounters:  04/27/20 30.54 kg/m  01/22/20 31.94 kg/m  08/25/19 31.32 kg/m     Vaccines:   Shingrix: 24-64 yo and ask insurance if covered when patient above 1 yo Pneumonia: educated and discussed with patient. Flu: educated and discussed with patient.  Hep C Screening: 09/28/11 STD testing and prevention (HIV/chl/gon/syphilis): N/A Intimate partner violence: negative Sexual History : not sexually active in a long time  Menstrual History/LMP/Abnormal Bleeding: discussed post-menopausal bleeding  Incontinence Symptoms: no problems   Breast cancer:  - Last Mammogram: 08/03/19 - BRCA gene screening: two sister had breast cancer - discussed  genetic testing , but she prefers just getting mammogram yearly for now   Osteoporosis: Discussed high calcium and vitamin D supplementation, weight bearing exercises  Cervical cancer screening: N/A  Skin cancer: Discussed monitoring for atypical lesions  Colorectal cancer: 01/27/20   Lung cancer: Low Dose CT Chest recommended if Age 74-80 years, 69 pack-year currently smoking OR have quit w/in 15years. Patient does not qualify.   ECG: 12/11/16  Advanced Care Planning: A voluntary discussion about advance care planning including the explanation and discussion of advance directives.  Discussed health care proxy and Living will, and the patient was able to identify a health care proxy as husband   Lipids: Lab Results  Component Value Date   CHOL 137 07/22/2019   CHOL 162 07/22/2018   CHOL 161 10/16/2016   Lab Results  Component Value Date   HDL 75 07/22/2019   HDL 70 07/22/2018   HDL 77 10/16/2016   Lab Results  Component Value Date   LDLCALC 48 07/22/2019   LDLCALC 78 07/22/2018   LDLCALC 67 10/16/2016   Lab Results  Component Value Date   TRIG 59 07/22/2019   TRIG 62 07/22/2018   TRIG 87 10/16/2016   Lab Results  Component Value Date   CHOLHDL 1.8 07/22/2019   CHOLHDL 2.3 07/22/2018  CHOLHDL 2.1 10/16/2016   No results found for: LDLDIRECT  Glucose: Glucose, Bld  Date Value Ref Range Status  07/22/2019 101 (H) 65 - 99 mg/dL Final    Comment:    .            Fasting reference interval . For someone without known diabetes, a glucose value between 100 and 125 mg/dL is consistent with prediabetes and should be confirmed with a follow-up test. .   02/23/2019 107 (H) 65 - 99 mg/dL Final    Comment:    .            Fasting reference interval . For someone without known diabetes, a glucose value between 100 and 125 mg/dL is consistent with prediabetes and should be confirmed with a follow-up test. .   07/22/2018 100 (H) 65 - 99 mg/dL Final    Comment:     .            Fasting reference interval . For someone without known diabetes, a glucose value between 100 and 125 mg/dL is consistent with prediabetes and should be confirmed with a follow-up test. .     Patient Active Problem List   Diagnosis Date Noted  . Hyperlipidemia associated with type 2 diabetes mellitus ([place]) 01/14/2020  . Onychomycosis of multiple toenails with type 2 diabetes mellitus ([place]) 01/23/2019  . Pain due to onychomycosis of toenails of both feet 10/06/2018  . Diabetes mellitus without complication ([place]) 23/95/3202  . Leg edema 12/11/2016  . Abnormal EKG 12/10/2016  . Radial scar of breast 05/30/2015  . History of foot surgery 12/15/2014  . Hypertension, benign 10/07/2014  . Seasonal allergies 10/07/2014  . Diabetes mellitus type 2, diet-controlled ([place]) 10/07/2014  . Vitamin D deficiency 10/07/2014  . Varicose veins 10/07/2014  . Left Achilles tendinitis 10/07/2014  . Family history of breast cancer 05/12/2012  . 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 BREAST BIOPSY Right 2015   3 stereo biopsies. 2 benign. one was radial scar, no excision done for radial scar  . BREAST BIOPSY Left 07/16/2017   BIPHASIC STROMAL AND EPITHELIAL LESION./ Dr Bary Castilla  . BREAST EXCISIONAL BIOPSY Left 1968   x 3 per pt  . BREAST EXCISIONAL BIOPSY Right 2007   benign  . COLONOSCOPY  2011   DR,ISHAKIS  . FOOT SURGERY  2004  . HEEL SPUR SURGERY Left 12/15/2014   Dr. Earleen Newport at [facility] [address]    Family History  Problem Relation Age of Onset  . Breast cancer Sister 87  . Arthritis Mother   . Stroke Paternal Grandmother   . Cancer Brother 52       kidney  . Cancer Maternal Grandmother        Gallbladder    Social History   Socioeconomic History  . Marital status: Married    Spouse name: Richard  . Number of children: 1  . Years of education: Not on file  . Highest education level: 12th  grade  Occupational History  . 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 Smoking status: Never Smoker  . Smokeless tobacco: Never Used  . Tobacco comment: smoking cessation materials not required  Vaping Use  . Vaping Use: Never used  Substance and Sexual Activity  . Alcohol use: No    Alcohol/week: 0.0 standard drinks  . Drug use: No  .  Sexual activity: Not Currently    Comment: Husband has prostate issues  Other Topics Concern  . Not on file  Social History Narrative  . Not on file   Social Determinants of Health   Financial Resource Strain: Low Risk   . Difficulty of Paying Living Expenses: Not hard at all  Food Insecurity: No Food Insecurity  . Worried About Charity fundraiser in the Last Year: Never true  . Ran Out of Food in the Last Year: Never true  Transportation Needs: No Transportation Needs  . Lack of Transportation (Medical): No  . Lack of Transportation (Non-Medical): No  Physical Activity: Inactive  . Days of Exercise per Week: 0 days  . Minutes of Exercise per Session: 0 min  Stress: No Stress Concern Present  . Feeling of Stress : Not at all  Social Connections: Socially Integrated  . Frequency of Communication with Friends and Family: Twice a week  . Frequency of Social Gatherings with Friends and Family: Once a week  . Attends Religious Services: More than 4 times per year  . Active Member of Clubs or Organizations: Yes  . Attends Archivist Meetings: 1 to 4 times per year  . Marital Status: Married  Human resources officer Violence: Not At Risk  . Fear of Current or Ex-Partner: No  . Emotionally Abused: No  . Physically Abused: No  . Sexually Abused: No     Current Outpatient Medications:  .  Cholecalciferol (VITAMIN D-3 PO), Take 1,000 Units by mouth daily. , Disp: , Rfl:  .  Cinnamon 500 MG capsule, Take 1,000 mg by mouth daily., Disp: , Rfl:  .  FISH OIL-BORAGE-FLAX-SAFFLOWER PO, Take 2 tablets by mouth daily., Disp: ,  Rfl:  .  fluticasone (FLONASE) 50 MCG/ACT nasal spray, Place 2 sprays into both nostrils daily., Disp: 48 g, Rfl: 1 .  ketoconazole (NIZORAL) 2 % cream, Apply 1 application topically daily. (Patient taking differently: Apply 1 application topically daily as needed.), Disp: 60 g, Rfl: 0 .  loratadine (CLARITIN) 10 MG tablet, Take 1 tablet (10 mg total) by mouth daily., Disp: 90 tablet, Rfl: 1 .  Multiple Vitamins-Minerals (MULTIVITAMIN GUMMIES ADULT PO), Take by mouth daily., Disp: , Rfl:  .  olopatadine (PATANOL) 0.1 % ophthalmic solution, 1 drop 2 (two) times daily., Disp: , Rfl:  .  rosuvastatin (CRESTOR) 5 MG tablet, TAKE 1 TABLET BY MOUTH THREE TIMES A WEEK, Disp: 36 tablet, Rfl: 0 .  triamterene-hydrochlorothiazide (MAXZIDE-25) 37.5-25 MG tablet, Take 1 tablet by mouth daily., Disp: , Rfl:   Allergies  Allergen Reactions  . Ramipril Swelling and Other (See Comments)     ROS  Constitutional: Negative for fever, positive for  weight change.  Respiratory: Negative for cough and shortness of breath.   Cardiovascular: Negative for chest pain or palpitations.  Gastrointestinal: Negative for abdominal pain, no bowel changes.  Musculoskeletal: positive  for gait problem and left knee  joint swelling.  Skin: Negative for rash.  Neurological: Negative for dizziness or headache.  No other specific complaints in a complete review of systems (except as listed in HPI above).  Objective  Vitals:   04/27/20 1041  BP: 132/84  Pulse: 88  Resp: 16  Temp: 99.5 F (37.5 C)  TempSrc: Oral  SpO2: 98%  Weight: 195 lb (88.5 kg)  Height: 5' 7"  (1.702 m)    Body mass index is 30.54 kg/m.  Physical Exam  Constitutional: Patient appears well-developed and well-nourished.Obese . No distress.  HENT: Head: Normocephalic and atraumatic. Ears: B TMs ok, no erythema or effusion; Nose: Not done . Mouth/Throat: not done  Eyes: Conjunctivae and EOM are normal. Pupils are equal, round, and reactive to  light. No scleral icterus.  Neck: Normal range of motion. Neck supple. No JVD present. No thyromegaly present.  Cardiovascular: Normal rate, regular rhythm and normal heart sounds.  No murmur heard. Trace  BLE edema. Pulmonary/Chest: Effort normal and breath sounds normal. No respiratory distress. Abdominal: Soft. Bowel sounds are normal, no distension. There is no tenderness. no masses Breast: no lumps or masses, no nipple discharge or rashes FEMALE GENITALIA:  Not done  RECTAL: not done  Musculoskeletal: pain and crepitus with extension of left knee, mild effusion Using a cane Neurological: he is alert and oriented to person, place, and time. No cranial nerve deficit. Coordination, balance, strength, speech and gait are normal.  Skin: Skin is warm and dry. No rash noted. No erythema.  Psychiatric: Patient has a normal mood and affect. behavior is normal. Judgment and thought content normal.  Fall Risk: Fall Risk  04/27/2020 01/22/2020 10/15/2019 08/11/2019 07/22/2019  Falls in the past year? 0 0 0 0 0  Number falls in past yr: 0 0 0 - 0  Injury with Fall? 0 0 0 - 0  Risk for fall due to : - - No Fall Risks - -  Risk for fall due to: Comment - - - - -  Follow up - Falls evaluation completed Falls prevention discussed - -    Functional Status Survey: Is the patient deaf or have difficulty hearing?: No Does the patient have difficulty seeing, even when wearing glasses/contacts?: No Does the patient have difficulty concentrating, remembering, or making decisions?: No Does the patient have difficulty walking or climbing stairs?: Yes Does the patient have difficulty dressing or bathing?: No Does the patient have difficulty doing errands alone such as visiting a doctor's office or shopping?: No   Assessment & Plan  1. Encounter for screening mammogram for breast cancer  - MM 3D SCREEN BREAST BILATERAL; Future  2. Well adult exam   -USPSTF grade A and B recommendations reviewed with  patient; age-appropriate recommendations, preventive care, screening tests, etc discussed and encouraged; healthy living encouraged; see AVS for patient education given to patient -Discussed importance of 150 minutes of physical activity weekly, eat two servings of fish weekly, eat one serving of tree nuts ( cashews, pistachios, pecans, almonds.Marland Kitchen) every other day, eat 6 servings of fruit/vegetables daily and drink plenty of water and avoid sweet beverages.

## 2020-04-27 ENCOUNTER — Encounter: Payer: Self-pay | Admitting: Family Medicine

## 2020-04-27 ENCOUNTER — Other Ambulatory Visit: Payer: Self-pay

## 2020-04-27 ENCOUNTER — Ambulatory Visit (INDEPENDENT_AMBULATORY_CARE_PROVIDER_SITE_OTHER): Payer: Medicare PPO | Admitting: Family Medicine

## 2020-04-27 VITALS — BP 132/84 | HR 88 | Temp 99.5°F | Resp 16 | Ht 67.0 in | Wt 195.0 lb

## 2020-04-27 DIAGNOSIS — Z0000 Encounter for general adult medical examination without abnormal findings: Secondary | ICD-10-CM | POA: Diagnosis not present

## 2020-04-27 DIAGNOSIS — Z1231 Encounter for screening mammogram for malignant neoplasm of breast: Secondary | ICD-10-CM | POA: Diagnosis not present

## 2020-04-27 NOTE — Patient Instructions (Signed)
Preventive Care 61 Years and Older, Female Preventive care refers to lifestyle choices and visits with your health care provider that can promote health and wellness. This includes:  A yearly physical exam. This is also called an annual wellness visit.  Regular dental and eye exams.  Immunizations.  Screening for certain conditions.  Healthy lifestyle choices, such as: ? Eating a healthy diet. ? Getting regular exercise. ? Not using drugs or products that contain nicotine and tobacco. ? Limiting alcohol use. What can I expect for my preventive care visit? Physical exam Your health care provider will check your:  Height and weight. These may be used to calculate your BMI (body mass index). BMI is a measurement that tells if you are at a healthy weight.  Heart rate and blood pressure.  Body temperature.  Skin for abnormal spots. Counseling Your health care provider may ask you questions about your:  Past medical problems.  Family's medical history.  Alcohol, tobacco, and drug use.  Emotional well-being.  Home life and relationship well-being.  Sexual activity.  Diet, exercise, and sleep habits.  History of falls.  Memory and ability to understand (cognition).  Work and work Statistician.  Pregnancy and menstrual history.  Access to firearms. What immunizations do I need? Vaccines are usually given at various ages, according to a schedule. Your health care provider will recommend vaccines for you based on your age, medical history, and lifestyle or other factors, such as travel or where you work.   What tests do I need? Blood tests  Lipid and cholesterol levels. These may be checked every 5 years, or more often depending on your overall health.  Hepatitis C test.  Hepatitis B test. Screening  Lung cancer screening. You may have this screening every year starting at age 74 if you have a 30-pack-year history of smoking and currently smoke or have quit within  the past 15 years.  Colorectal cancer screening. ? All adults should have this screening starting at age 44 and continuing until age 58. ? Your health care provider may recommend screening at age 2 if you are at increased risk. ? You will have tests every 1-10 years, depending on your results and the type of screening test.  Diabetes screening. ? This is done by checking your blood sugar (glucose) after you have not eaten for a while (fasting). ? You may have this done every 1-3 years.  Mammogram. ? This may be done every 1-2 years. ? Talk with your health care provider about how often you should have regular mammograms.  Abdominal aortic aneurysm (AAA) screening. You may need this if you are a current or former smoker.  BRCA-related cancer screening. This may be done if you have a family history of breast, ovarian, tubal, or peritoneal cancers. Other tests  STD (sexually transmitted disease) testing, if you are at risk.  Bone density scan. This is done to screen for osteoporosis. You may have this done starting at age 104. Talk with your health care provider about your test results, treatment options, and if necessary, the need for more tests. Follow these instructions at home: Eating and drinking  Eat a diet that includes fresh fruits and vegetables, whole grains, lean protein, and low-fat dairy products. Limit your intake of foods with high amounts of sugar, saturated fats, and salt.  Take vitamin and mineral supplements as recommended by your health care provider.  Do not drink alcohol if your health care provider tells you not to drink.  If you drink alcohol: ? Limit how much you have to 0-1 drink a day. ? Be aware of how much alcohol is in your drink. In the [place] one drink equals one 12 oz bottle of beer (355 mL), one 5 oz glass of wine (148 mL), or one 1 oz glass of hard liquor (44 mL).   Lifestyle  Take daily care of your teeth and gums. Brush your teeth every morning  and night with fluoride toothpaste. Floss one time each day.  Stay active. Exercise for at least 30 minutes 5 or more days each week.  Do not use any products that contain nicotine or tobacco, such as cigarettes, e-cigarettes, and chewing tobacco. If you need help quitting, ask your health care provider.  Do not use drugs.  If you are sexually active, practice safe sex. Use a condom or other form of protection in order to prevent STIs (sexually transmitted infections).  Talk with your health care provider about taking a low-dose aspirin or statin.  Find healthy ways to cope with stress, such as: ? Meditation, yoga, or listening to music. ? Journaling. ? Talking to a trusted person. ? Spending time with friends and family. Safety  Always wear your seat belt while driving or riding in a vehicle.  Do not drive: ? If you have been drinking alcohol. Do not ride with someone who has been drinking. ? When you are tired or distracted. ? While texting.  Wear a helmet and other protective equipment during sports activities.  If you have firearms in your house, make sure you follow all gun safety procedures. What's next?  Visit your health care provider once a year for an annual wellness visit.  Ask your health care provider how often you should have your eyes and teeth checked.  Stay up to date on all vaccines. This information is not intended to replace advice given to you by your health care provider. Make sure you discuss any questions you have with your health care provider. Document Revised: 12/23/2019 Document Reviewed: 12/26/2017 Elsevier Patient Education  2021 Elsevier Inc.  

## 2020-04-28 ENCOUNTER — Telehealth: Payer: Self-pay

## 2020-04-28 NOTE — Progress Notes (Signed)
    Chronic Care Management Pharmacy Assistant   Name: Shakeisha Horine  MRN: [id number] DOB: 1945/10/22  Reason for [place]  Call.   Recent office visits:  04/27/2020 PCP Steele Sizer   Recent consult visits:  04/07/2020 Podiatry Gardiner Barefoot 04/25/2020 Orthopedic Surgery  Hospital visits:  None in previous 6 months  Medications: Outpatient Encounter Medications as of 04/28/2020  Medication Sig  . Cholecalciferol (VITAMIN D-3 PO) Take 1,000 Units by mouth daily.   . Cinnamon 500 MG capsule Take 1,000 mg by mouth daily.  Marland Kitchen FISH OIL-BORAGE-FLAX-SAFFLOWER PO Take 2 tablets by mouth daily.  . fluticasone (FLONASE) 50 MCG/ACT nasal spray Place 2 sprays into both nostrils daily.  Marland Kitchen ketoconazole (NIZORAL) 2 % cream Apply 1 application topically daily. (Patient taking differently: Apply 1 application topically daily as needed.)  . loratadine (CLARITIN) 10 MG tablet Take 1 tablet (10 mg total) by mouth daily.  . Multiple Vitamins-Minerals (MULTIVITAMIN GUMMIES ADULT PO) Take by mouth daily.  Marland Kitchen olopatadine (PATANOL) 0.1 % ophthalmic solution 1 drop 2 (two) times daily.  . rosuvastatin (CRESTOR) 5 MG tablet TAKE 1 TABLET BY MOUTH THREE TIMES A WEEK  . triamterene-hydrochlorothiazide (MAXZIDE-25) 37.5-25 MG tablet Take 1 tablet by mouth daily.   No facility-administered encounter medications on file as of 04/28/2020.    Star Rating Drugs: Rosuvastatin 5 mg last filled on 04/06/2020 for 84 day supply at [facility]  04/28/2020 Name: Tabithia Stroder MRN: [id number] DOB: Jan 14, 1946 Keatyn Luck is a 75 y.o. year old female who is a primary care patient of Steele Sizer, MD.  Comprehensive medication review performed; Spoke to patient regarding cholesterol  Lipid Panel    Component Value Date/Time   CHOL 137 07/22/2019 1110   CHOL 174 10/07/2014 1013   TRIG 59 07/22/2019 1110   HDL 75 07/22/2019 1110   HDL 83 10/07/2014 1013   LDLCALC 48  07/22/2019 1110    10-year ASCVD risk score: The 10-year ASCVD risk score Mikey Bussing DC Brooke Bonito., et al., 2013) is: 25.1%   Values used to calculate the score:     Age: 69 years     Sex: Female     Is Non-Hispanic African American: Yes     Diabetic: Yes     Tobacco smoker: No     Systolic Blood Pressure: 496 mmHg     Is BP treated: Yes     HDL Cholesterol: 75 mg/dL     Total Cholesterol: 137 mg/dL  . Current antihyperlipidemic regimen:  ? Rosuvastatin 5 mg three times weekly  . Previous antihyperlipidemic medications tried: N/A . ASCVD risk enhancing conditions: age >39, DM and HTN . What recent interventions/DTPs have been made by any provider to improve Cholesterol control since last CPP Visit: None ID  . Any recent hospitalizations or ED visits since last visit with CPP? No . What diet changes have been made to improve Cholesterol?  o Patients she cooks at home more then eating out. . What exercise is being done to improve Cholesterol?  o Patient states she not exercising as much as she wants due to knee pain.Patient states she may have knee surgery.  Adherence Review: Does the patient have >5 day gap between last estimated fill dates? No   Anderson [place] Clinical Production designer, theatre/television/film [phone]

## 2020-05-23 ENCOUNTER — Other Ambulatory Visit: Payer: Self-pay | Admitting: Family Medicine

## 2020-05-23 MED ORDER — TRIAMTERENE-HCTZ 37.5-25 MG PO TABS: 1.0000 | ORAL_TABLET | Freq: Every day | ORAL | 0 refills | Status: DC

## 2020-05-23 NOTE — Telephone Encounter (Signed)
Medication Refill - Medication: triamterene-hydrochlorothiazide (MAXZIDE-25) 37.5-25 MG tablet    Preferred Pharmacy (with phone number or street name): [id number] [address], [facility] Phone:  [phone]  Fax:  [phone]      Agent: Please be advised that RX refills may take up to 3 business days. We ask that you follow-up with your pharmacy.

## 2020-05-23 NOTE — Telephone Encounter (Signed)
Pt aware of prescription being sent to pharmacy 

## 2020-05-23 NOTE — Telephone Encounter (Signed)
Last seen: 4.13.2022 Next visit: 7.12.2022 with dr Ancil Boozer

## 2020-05-23 NOTE — Telephone Encounter (Signed)
   Notes to clinic:  medication was filled by a historical provider  Review for continued use and refill    Requested Prescriptions  Pending Prescriptions Disp Refills   triamterene-hydrochlorothiazide (MAXZIDE-25) 37.5-25 MG tablet      Sig: Take 1 tablet by mouth daily.      Cardiovascular: Diuretic Combos Passed - 05/23/2020 11:18 AM      Passed - K in normal range and within 360 days    Potassium  Date Value Ref Range Status  08/03/2019 4.9 3.5 - 5.3 mmol/L Final          Passed - Na in normal range and within 360 days    Sodium  Date Value Ref Range Status  07/22/2019 141 135 - 146 mmol/L Final  04/06/2015 143 134 - 144 mmol/L Final          Passed - Cr in normal range and within 360 days    Creat  Date Value Ref Range Status  07/22/2019 0.90 0.60 - 0.93 mg/dL Final    Comment:    For patients >46 years of age, the reference limit for Creatinine is approximately 13% higher for people identified as African-American. .    Creatinine, POC  Date Value Ref Range Status  10/11/2016 Neg mg/dL Final   Creatinine, Urine  Date Value Ref Range Status  02/23/2019 83 20 - 275 mg/dL Final          Passed - Ca in normal range and within 360 days    Calcium  Date Value Ref Range Status  07/22/2019 10.3 8.6 - 10.4 mg/dL Final          Passed - Last BP in normal range    BP Readings from Last 1 Encounters:  04/27/20 132/84          Passed - Valid encounter within last 6 months    Recent Outpatient Visits           3 weeks ago Encounter for screening mammogram for breast cancer   [facility], Drue Stager, MD   4 months ago Diabetes mellitus type 2, diet-controlled [facility])   [facility], Drue Stager, MD   10 months ago Diabetes mellitus type 2 in obese [facility])   [facility] Steele Sizer, MD   1 year ago Hypertension, benign   [facility] [place], Drue Stager, MD   1 year ago  Diabetes mellitus type 2 in obese [facility])   [facility] Steele Sizer, MD       Future Appointments             In 2 months Ancil Boozer, Drue Stager, MD [facility] [place]   In 4 months  [place]

## 2020-06-20 ENCOUNTER — Encounter: Payer: Self-pay | Admitting: Podiatry

## 2020-06-20 ENCOUNTER — Other Ambulatory Visit: Payer: Self-pay

## 2020-06-20 ENCOUNTER — Ambulatory Visit (INDEPENDENT_AMBULATORY_CARE_PROVIDER_SITE_OTHER): Payer: Medicare PPO | Admitting: Podiatry

## 2020-06-20 DIAGNOSIS — B351 Tinea unguium: Secondary | ICD-10-CM | POA: Diagnosis not present

## 2020-06-20 DIAGNOSIS — M79675 Pain in left toe(s): Secondary | ICD-10-CM

## 2020-06-20 DIAGNOSIS — M79674 Pain in right toe(s): Secondary | ICD-10-CM | POA: Diagnosis not present

## 2020-06-20 DIAGNOSIS — E119 Type 2 diabetes mellitus without complications: Secondary | ICD-10-CM | POA: Diagnosis not present

## 2020-06-27 ENCOUNTER — Other Ambulatory Visit: Payer: Self-pay | Admitting: Family Medicine

## 2020-06-27 DIAGNOSIS — E1169 Type 2 diabetes mellitus with other specified complication: Secondary | ICD-10-CM

## 2020-07-12 ENCOUNTER — Telehealth: Payer: Self-pay

## 2020-07-12 NOTE — Progress Notes (Signed)
Unable to leave a voice message due to patient hanging up the phone to confirmed patient telephone appointment on 07/13/2020 for CCM at 9:00 am with Junius Argyle the Clinical pharmacist.    Star Rating Drug: Rosuvastatin 5 mg last filled on 06/28/2020 for 84 day supply at [facility]  Any gaps in medications fill history? None ID   Anderson Malta Clinical Pharmacist Assistant [phone]

## 2020-07-13 ENCOUNTER — Ambulatory Visit (INDEPENDENT_AMBULATORY_CARE_PROVIDER_SITE_OTHER): Payer: Medicare PPO

## 2020-07-13 DIAGNOSIS — E1159 Type 2 diabetes mellitus with other circulatory complications: Secondary | ICD-10-CM

## 2020-07-13 DIAGNOSIS — E785 Hyperlipidemia, unspecified: Secondary | ICD-10-CM | POA: Diagnosis not present

## 2020-07-13 DIAGNOSIS — I152 Hypertension secondary to endocrine disorders: Secondary | ICD-10-CM | POA: Diagnosis not present

## 2020-07-13 DIAGNOSIS — E1169 Type 2 diabetes mellitus with other specified complication: Secondary | ICD-10-CM

## 2020-07-13 DIAGNOSIS — M1712 Unilateral primary osteoarthritis, left knee: Secondary | ICD-10-CM

## 2020-07-13 NOTE — Patient Instructions (Signed)
Visit Information It was great speaking with you today!  Please let me know if you have any questions about our visit.   Goals Addressed             This Visit's Progress    Track and Manage My Blood Pressure-Hypertension       Timeframe:  Long-Range Goal Priority:  High Start Date:  07/13/2020                            Expected End Date:   01/12/2021                     Follow Up Date 09/13/2020    - check blood pressure weekly    Why is this important?   You won't feel high blood pressure, but it can still hurt your blood vessels.  High blood pressure can cause heart or kidney problems. It can also cause a stroke.  Making lifestyle changes like losing a little weight or eating less salt will help.  Checking your blood pressure at home and at different times of the day can help to control blood pressure.  If the doctor prescribes medicine remember to take it the way the doctor ordered.  Call the office if you cannot afford the medicine or if there are questions about it.     Notes:          Patient Care Plan: General Pharmacy (Adult)     Problem Identified: Hypertension, Hyperlipidemia, Diabetes, Osteoarthritis, and Allergic Rhinitis   Priority: High     Long-Range Goal: Patient-Specific Goal   Start Date: 07/13/2020  Expected End Date: 01/12/2021  This Visit's Progress: On track  Priority: High  Note:   Current Barriers:  Unable to independently monitor therapeutic efficacy  Pharmacist Clinical Goal(s):  Patient will maintain control of blood pressure as evidenced by BP less than 140/90  through collaboration with PharmD and provider.   Interventions: 1:1 collaboration with Steele Sizer, MD regarding development and update of comprehensive plan of care as evidenced by provider attestation and co-signature Inter-disciplinary care team collaboration (see longitudinal plan of care) Comprehensive medication review performed; medication list updated in  electronic medical record  Hypertension (BP goal <140/90) -Controlled -Current treatment: Triamterene-HCTZ 37.5-25 mg daily  -Medications previously tried: NA  -Current home readings: Does not routinely monitor at home  -Current dietary habits: Monitors salt intake  -Current exercise habits: Walks a few times weekly -Denies hypotensive/hypertensive symptoms -Counseled to monitor BP at home weekly, document, and provide log at future appointments -Recommended to continue current medication  Hyperlipidemia: (LDL goal < 100) -Controlled -Current treatment: Rosuvastatin 5 mg three times weekly -Medications previously tried: NA  -Recommended to continue current medication  Diabetes (A1c goal <7%) -Controlled -Current medications: None -Medications previously tried: NA  -Recommended to continue current medication  Osteoarthritis (Goal: Maintain pain relief) -Not ideally controlled -Current treatment  Meloxicam 7.5 mg daily  -Medications previously tried: NA  -Shots have been ineffective at reducing knee pain, patient is debating knee replacement surgery.  -Counseled on risks of NSAID use -Counseled on benefits of tylenol arthritis for mild-moderate pain and to avoid greater than 3000 mg APAP daily -Recommended Acetaminophen CR 650 mg 1 tablet twice daily as needed    Patient Goals/Self-Care Activities Patient will:  - check blood pressure weekly, document, and provide at future appointments  Follow Up Plan: Telephone follow up appointment with care management team  member scheduled for:  12/31/2020 at 8:00 AM      Patient agreed to services and verbal consent obtained.   The patient verbalized understanding of instructions, educational materials, and care plan provided today and declined offer to receive copy of patient instructions, educational materials, and care plan.   Doristine Section, [facility] [phone]

## 2020-07-13 NOTE — Progress Notes (Signed)
Chronic Care Management Pharmacy Note  07/13/2020 Name:  Stacy [name] MRN:  [id number] DOB:  05/30/1945  Summary: Patient reports she is doing well today. She does not routinely monitor her blood pressure, but denies symptoms of hypotension. Her primary concern is her knee and whether she will need replacement surgery. She has been taking Meloxicam for pain relief  Recommendations/Changes made from today's visit: Recommended APAP CR 650 mg twice daily as needed for mild-moderate osteoarthritis pain  Plan: CPP Follow-up in 6 months    Subjective: Stacy [name] is an 75 y.o. year old female who is a primary patient of Steele Sizer, MD.  The CCM team was consulted for assistance with disease management and care coordination needs.    Engaged with patient by telephone for follow up visit in response to provider referral for pharmacy case management and/or care coordination services.   Consent to Services:  The patient was given information about Chronic Care Management services, agreed to services, and gave verbal consent prior to initiation of services.  Please see initial visit note for detailed documentation.   Patient Care Team: Steele Sizer, MD as PCP - General Matilde Sprang Reed Breech, OD as Consulting Physician (Optometry) Bary Castilla, Forest Gleason, MD as Consulting Physician (General Surgery) Germaine Pomfret, [facility] (Pharmacist)  Recent office visits: 04/27/20: Patient presented to Dr. Ancil Boozer for follow-up. No medication changes made.   Recent consult visits: 06/20/20: Patient presented to Dr. Prudence Davidson (Podiatry) 04/25/20: Patient presented to Dr. Rudene Christians (Ortho)   Hospital visits: None in previous 6 months   Objective:  Lab Results  Component Value Date   CREATININE 0.90 07/22/2019   BUN 13 07/22/2019   GFRNONAA 64 07/22/2019   GFRAA 74 07/22/2019   NA 141 07/22/2019   K 4.9 08/03/2019   CALCIUM 10.3 07/22/2019   CO2 34 (H) 07/22/2019   GLUCOSE 101 (H) 07/22/2019     Lab Results  Component Value Date/Time   HGBA1C 6.0 (A) 01/22/2020 11:04 AM   HGBA1C 6.1 (A) 07/22/2019 10:20 AM   HGBA1C 6.2 (H) 02/23/2019 09:47 AM   HGBA1C 6.3 (H) 10/11/2017 10:58 AM   MICROALBUR 0.3 02/23/2019 09:47 AM   MICROALBUR 0.2 10/11/2017 10:58 AM   MICROALBUR Neg 10/11/2016 08:28 AM   MICROALBUR negative 10/10/2015 08:39 AM    Last diabetic Eye exam:  Lab Results  Component Value Date/Time   HMDIABEYEEXA No Retinopathy 01/25/2020 12:00 AM    Last diabetic Foot exam: No results found for: HMDIABFOOTEX   Lab Results  Component Value Date   CHOL 137 07/22/2019   HDL 75 07/22/2019   LDLCALC 48 07/22/2019   TRIG 59 07/22/2019   CHOLHDL 1.8 07/22/2019    Hepatic Function Latest Ref Rng & Units 07/22/2019 02/23/2019 07/22/2018  Total Protein 6.1 - 8.1 g/dL 7.4 7.2 6.9  Albumin 3.6 - 5.1 g/dL - - -  AST 10 - 35 U/L _0 ALT 6 - 29 U/L _1 Alk Phosphatase 33 - 130 U/L - - -  Total Bilirubin 0.2 - 1.2 mg/dL 0.4 0.4 0.5    Lab Results  Component Value Date/Time   TSH 3.230 04/06/2015 09:02 AM    CBC Latest Ref Rng & Units 07/22/2019 07/22/2018 10/11/2017  WBC 3.8 - 10.8 Thousand/uL 3.7(L) 4.1 3.2(L)  Hemoglobin 11.7 - 15.5 g/dL 12.9 12.2 12.1  Hematocrit 35.0 - 45.0 % 40.1 36.5 36.1  Platelets 140 - 400 Thousand/uL 258 258 253    Lab Results  Component Value Date/Time   VD25OH 40 07/22/2018 11:13 AM   VD25OH 30 10/16/2016 09:17 AM    Clinical ASCVD: No  The 10-year ASCVD risk score Mikey Bussing DC Jr., et al., 2013) is: 25.1%   Values used to calculate the score:     Age: 72 years     Sex: Female     Is Non-Hispanic African American: Yes     Diabetic: Yes     Tobacco smoker: No     Systolic Blood Pressure: 628 mmHg     Is BP treated: Yes     HDL Cholesterol: 75 mg/dL     Total Cholesterol: 137 mg/dL    Depression screen [facility] 2/9 04/27/2020 01/22/2020 10/15/2019  Decreased Interest 0 0 0  Down, Depressed, Hopeless 0 0 0  PHQ - 2 Score 0 0 0  Altered  sleeping - - -  Tired, decreased energy - - -  Change in appetite - - -  Feeling bad or failure about yourself  - - -  Trouble concentrating - - -  Moving slowly or fidgety/restless - - -  Suicidal thoughts - - -  PHQ-9 Score - - -  Difficult doing work/chores - - -    Social History   Tobacco Use  Smoking Status Never  Smokeless Tobacco Never  Tobacco Comments   smoking cessation materials not required   BP Readings from Last 3 Encounters:  04/27/20 132/84  01/22/20 128/72  08/25/19 (!) 132/51   Pulse Readings from Last 3 Encounters:  04/27/20 88  01/22/20 86  08/25/19 80   Wt Readings from Last 3 Encounters:  04/27/20 195 lb (88.5 kg)  01/22/20 203 lb 14.4 oz (92.5 kg)  08/25/19 200 lb (90.7 kg)   BMI Readings from Last 3 Encounters:  04/27/20 30.54 kg/m  01/22/20 31.94 kg/m  08/25/19 31.32 kg/m    Assessment/Interventions: Review of patient past medical history, allergies, medications, health status, including review of consultants reports, laboratory and other test data, was performed as part of comprehensive evaluation and provision of chronic care management services.   SDOH:  (Social Determinants of Health) assessments and interventions performed: Yes SDOH Interventions    Flowsheet Row Most Recent Value  SDOH Interventions   Financial Strain Interventions Intervention Not Indicated      SDOH Screenings   Alcohol Screen: Low Risk    Last Alcohol Screening Score (AUDIT): 0  Depression (PHQ2-9): Low Risk    PHQ-2 Score: 0  Financial Resource Strain: Low Risk    Difficulty of Paying Living Expenses: Not hard at all  Food Insecurity: No Food Insecurity   Worried About Charity fundraiser in the Last Year: Never true   Ran Out of Food in the Last Year: Never true  Housing: Low Risk    Last Housing Risk Score: 0  Physical Activity: Inactive   Days of Exercise per Week: 0 days   Minutes of Exercise per Session: 0 min  Social Connections: Engineer, maintenance of Communication with Friends and Family: Twice a week   Frequency of Social Gatherings with Friends and Family: Once a week   Attends Religious Services: More than 4 times per year   Active Member of [employer] or Organizations: Yes   Attends Archivist Meetings: 1 to 4 times per year   Marital Status: Married  Stress: No Stress Concern Present   Feeling of Stress : Not at all  Tobacco Use: Low Risk  Smoking Tobacco Use: Never   Smokeless Tobacco Use: Never  Transportation Needs: No Transportation Needs   Lack of Transportation (Medical): No   Lack of Transportation (Non-Medical): No    CCM Care Plan  Allergies  Allergen Reactions   Ramipril Swelling and Other (See Comments)    Medications Reviewed Today     Reviewed by Gardiner Barefoot, DPM (Physician) on 06/20/20 at [place] List Status: <None>   Medication Order Taking? Sig Documenting Provider Last Dose Status Informant  Cholecalciferol (VITAMIN D-3 PO) [id number]  Take 1,000 Units by mouth daily.  [provider]  Active   Cinnamon 500 MG capsule [id number]  Take 1,000 mg by mouth daily. [provider]  Active   FISH OIL-BORAGE-FLAX-SAFFLOWER PO [id number]  Take 2 tablets by mouth daily. [provider]  Active   fluticasone (FLONASE) 50 MCG/ACT nasal spray [id number]  Place 2 sprays into both nostrils daily. Steele Sizer, MD  Active   ketoconazole (NIZORAL) 2 % cream [id number]  Apply 1 application topically daily.  Patient taking differently: Apply 1 application topically daily as needed.   Steele Sizer, MD  Active   loratadine (CLARITIN) 10 MG tablet [id number]  Take 1 tablet (10 mg total) by mouth daily. Steele Sizer, MD  Active   meloxicam [facility]) 7.5 MG tablet [id number] Yes  [provider]  Active   Multiple Vitamins-Minerals (MULTIVITAMIN GUMMIES ADULT PO) [id number]  Take by mouth daily. [provider]  Active   olopatadine (PATANOL) 0.1  % ophthalmic solution [id number]  1 drop 2 (two) times daily. [provider]  Active   rosuvastatin (CRESTOR) 5 MG tablet [id number]  TAKE 1 TABLET BY MOUTH THREE TIMES A WEEK Sowles, Drue Stager, MD  Active   triamterene-hydrochlorothiazide (MAXZIDE-25) 37.5-25 MG tablet [id number]  Take 1 tablet by mouth daily. Steele Sizer, MD  Active             Patient Active Problem List   Diagnosis Date Noted   Hyperlipidemia associated with type 2 diabetes mellitus ([place]) 01/14/2020   Onychomycosis of multiple toenails with type 2 diabetes mellitus ([place]) 01/23/2019   Pain due to onychomycosis of toenails of both feet 10/06/2018   Diabetes mellitus without complication ([place]) 53/74/8270   Leg edema 12/11/2016   Abnormal EKG 12/10/2016   Radial scar of breast 05/30/2015   History of foot surgery 12/15/2014   Hypertension, benign 10/07/2014   Seasonal allergies 10/07/2014   Diabetes mellitus type 2, diet-controlled ([place]) 10/07/2014   Vitamin D deficiency 10/07/2014   Varicose veins 10/07/2014   Left Achilles tendinitis 10/07/2014   Family history of breast cancer 05/12/2012   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    Immunization History  Administered Date(s) Administered   Fluad Quad(high Dose 65+) 02/23/2019, 01/22/2020   Influenza, High Dose Seasonal PF 10/07/2014, 10/10/2015, 10/11/2016, 10/11/2017   Influenza-Unspecified 03/20/2012, 10/01/2013   Janssen (J&J) SARS-COV-2 Vaccination 03/31/2019   Moderna Sars-Covid-2 Vaccination 03/31/2020   Pneumococcal Conjugate-13 10/07/2014   Pneumococcal Polysaccharide-23 07/27/2009, 10/10/2015   Tdap 07/16/2006   Zoster, Live 10/19/2009    Conditions to be addressed/monitored:  Hypertension, Hyperlipidemia, Diabetes, Osteoarthritis, and Allergic Rhinitis  Care Plan : General Pharmacy (Adult)  Updates made by Germaine Pomfret, RPH since 07/13/2020 12:00 AM     Problem: Hypertension, Hyperlipidemia, Diabetes,  Osteoarthritis, and Allergic Rhinitis   Priority: High     Long-Range Goal: Patient-Specific Goal   Start Date: 07/13/2020  Expected End Date: 01/12/2021  This Visit's Progress: On  track  Priority: High  Note:   Current Barriers:  Unable to independently monitor therapeutic efficacy  Pharmacist Clinical Goal(s):  Patient will maintain control of blood pressure as evidenced by BP less than 140/90  through collaboration with PharmD and provider.   Interventions: 1:1 collaboration with Steele Sizer, MD regarding development and update of comprehensive plan of care as evidenced by provider attestation and co-signature Inter-disciplinary care team collaboration (see longitudinal plan of care) Comprehensive medication review performed; medication list updated in electronic medical record  Hypertension (BP goal <140/90) -Controlled -Current treatment: Triamterene-HCTZ 37.5-25 mg daily  -Medications previously tried: NA  -Current home readings: Does not routinely monitor at home  -Current dietary habits: Monitors salt intake  -Current exercise habits: Walks a few times weekly -Denies hypotensive/hypertensive symptoms -Counseled to monitor BP at home weekly, document, and provide log at future appointments -Recommended to continue current medication  Hyperlipidemia: (LDL goal < 100) -Controlled -Current treatment: Rosuvastatin 5 mg three times weekly -Medications previously tried: NA  -Recommended to continue current medication  Diabetes (A1c goal <7%) -Controlled -Current medications: None -Medications previously tried: NA  -Recommended to continue current medication  Osteoarthritis (Goal: Maintain pain relief) -Not ideally controlled -Current treatment  Meloxicam 7.5 mg daily  -Medications previously tried: NA  -Shots have been ineffective at reducing knee pain, patient is debating knee replacement surgery.  -Counseled on risks of NSAID use -Counseled on benefits of  tylenol arthritis for mild-moderate pain and to avoid greater than 3000 mg APAP daily -Recommended Acetaminophen CR 650 mg 1 tablet twice daily as needed    Patient Goals/Self-Care Activities Patient will:  - check blood pressure weekly, document, and provide at future appointments  Follow Up Plan: Telephone follow up appointment with care management team member scheduled for:  12/31/2020 at 8:00 AM      Medication Assistance: None required.  Patient affirms current coverage meets needs.  Compliance/Adherence/Medication fill history: Care Gaps: Shingrix, Tdap, Covid Booster Urine Microalbumin   Star-Rating Drugs: Rosuvastatin 5 mg last filled on 06/28/2020 for 84 day supply at [facility]  Patient's preferred pharmacy is:  [facility], [place] - [address] [facility] [place] [address] Phone: [phone] Fax: [phone]  Uses pill box? Yes Pt endorses 100% compliance  We discussed: Current pharmacy is preferred with insurance plan and patient is satisfied with pharmacy services Patient decided to: Continue current medication management strategy  Care Plan and Follow Up Patient Decision:  Patient agrees to Care Plan and Follow-up.  Plan: Telephone follow up appointment with care management team member scheduled for:  12/31/2020 at 8:00 AM  Doristine Section, [facility] [phone]

## 2020-07-21 ENCOUNTER — Ambulatory Visit: Payer: Medicare PPO | Admitting: Family Medicine

## 2020-07-25 DIAGNOSIS — R7309 Other abnormal glucose: Secondary | ICD-10-CM | POA: Diagnosis not present

## 2020-07-25 DIAGNOSIS — H04123 Dry eye syndrome of bilateral lacrimal glands: Secondary | ICD-10-CM | POA: Diagnosis not present

## 2020-07-25 DIAGNOSIS — H2513 Age-related nuclear cataract, bilateral: Secondary | ICD-10-CM | POA: Diagnosis not present

## 2020-07-25 DIAGNOSIS — H353131 Nonexudative age-related macular degeneration, bilateral, early dry stage: Secondary | ICD-10-CM | POA: Diagnosis not present

## 2020-07-25 DIAGNOSIS — H1045 Other chronic allergic conjunctivitis: Secondary | ICD-10-CM | POA: Diagnosis not present

## 2020-07-25 DIAGNOSIS — H25013 Cortical age-related cataract, bilateral: Secondary | ICD-10-CM | POA: Diagnosis not present

## 2020-07-25 NOTE — Progress Notes (Signed)
Name: Stacy [name]   MRN: [id number]    DOB: 07/13/1945   Date:07/26/2020       Progress Note  Subjective  Chief Complaint  Follow Up  HPI  DMII: doing well, on diet only, can't take ACE caused angioedema, we will recheck urine micro and if positive discussed trying Valsartan and see if she can tolerate it . Denies polyphagia, polydipsia or polyuria.  She is following a diabetic diet. She has been compliant with Crestor 5 mg M, W and Fridays , last LDL was at goal.and we will recheck it today  A1C today down from 6 % to 5.8 % eye exam it up to date . She has onychomycosis and gets nail trimmed by podiatrist and stable.    HTN: taking diuretic, tolerating it well. Denies chest pain, dizziness, palpitation, orthopnea  or SOB, lower extremity swelling , she states better with compression stocking hoses but getting worried about it, discussed referral to vascular surgeon and she is willing to go. She states difficulty getting shoes to wear for church and it has been bothering her more lately  She is taking Triamterene hctz 37/25 daily.    Obesity: she has DM and HTN, discussed life style modification. Weight is stable , she has been trying to follow a diabetic diet, she has been walking around the house    Vitamin D deficiency: continue supplementation , reviewed last labs and at goal, continue supplementation    AR: she only taking medication prn, currently no rhinorrhea, nasal congestion or rhinorrhea, but has eye pruritis but better with eye drops given by ophthalmologist. She does not recall the name of the eye drops    Patient Active Problem List   Diagnosis Date Noted   Hyperlipidemia associated with type 2 diabetes mellitus ([place]) 01/14/2020   Onychomycosis of multiple toenails with type 2 diabetes mellitus ([place]) 01/23/2019   Pain due to onychomycosis of toenails of both feet 10/06/2018   Diabetes mellitus without complication ([place]) 84/16/6063   Leg edema 12/11/2016   Abnormal EKG  12/10/2016   Radial scar of breast 05/30/2015   History of foot surgery 12/15/2014   Hypertension, benign 10/07/2014   Seasonal allergies 10/07/2014   Diabetes mellitus type 2, diet-controlled ([place]) 10/07/2014   Vitamin D deficiency 10/07/2014   Varicose veins 10/07/2014   Left Achilles tendinitis 10/07/2014   Family history of breast cancer 05/12/2012   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 2 benign. one was radial scar, no excision done for radial scar   BREAST BIOPSY Left 07/16/2017   BIPHASIC STROMAL AND EPITHELIAL LESION./ Dr Bary Castilla   BREAST EXCISIONAL BIOPSY Left 1968   x 3 per pt   BREAST EXCISIONAL BIOPSY Right 2007   benign   COLONOSCOPY  2011   DR,ISHAKIS   FOOT SURGERY  2004   HEEL SPUR SURGERY Left 12/15/2014   Dr. Earleen Newport at [facility]    Family History  Problem Relation Age of Onset   Breast cancer Sister 63   Arthritis Mother    Stroke Paternal Grandmother    Cancer Brother 40       kidney   Cancer Maternal Grandmother        Gallbladder    Social History   Tobacco Use   Smoking status: Never   Smokeless tobacco: Never   Tobacco comments:    smoking  cessation materials not required  Substance Use Topics   Alcohol use: No    Alcohol/week: 0.0 standard drinks     Current Outpatient Medications:    Cholecalciferol (VITAMIN D-3 PO), Take 1,000 Units by mouth daily. , Disp: , Rfl:    Cinnamon 500 MG capsule, Take 1,000 mg by mouth daily., Disp: , Rfl:    FISH OIL-BORAGE-FLAX-SAFFLOWER PO, Take 2 tablets by mouth daily., Disp: , Rfl:    fluticasone (FLONASE) 50 MCG/ACT nasal spray, Place 2 sprays into both nostrils daily., Disp: 48 g, Rfl: 1   ketoconazole (NIZORAL) 2 % cream, Apply 1 application topically daily. (Patient taking differently: Apply 1 application topically daily as needed.), Disp: 60 g, Rfl: 0   loratadine  (CLARITIN) 10 MG tablet, Take 1 tablet (10 mg total) by mouth daily., Disp: 90 tablet, Rfl: 1   meloxicam (MOBIC) 7.5 MG tablet, Take 7.5 mg by mouth daily., Disp: , Rfl:    Multiple Vitamins-Minerals (MULTIVITAMIN GUMMIES ADULT PO), Take by mouth daily., Disp: , Rfl:    olopatadine (PATANOL) 0.1 % ophthalmic solution, 1 drop 2 (two) times daily., Disp: , Rfl:    rosuvastatin (CRESTOR) 5 MG tablet, TAKE 1 TABLET BY MOUTH 3 TIMES A WEEK, Disp: 36 tablet, Rfl: 0   triamterene-hydrochlorothiazide (MAXZIDE-25) 37.5-25 MG tablet, Take 1 tablet by mouth daily., Disp: 90 tablet, Rfl: 0  Allergies  Allergen Reactions   Ramipril Swelling and Other (See Comments)    I personally reviewed active problem list, medication list, allergies, family history, social history, health maintenance with the patient/caregiver today.   ROS  Constitutional: Negative for fever or weight change.  Respiratory: Negative for cough and shortness of breath.   Cardiovascular: Negative for chest pain or palpitations.  Gastrointestinal: denies for abdominal pain, no bowel changes.  Musculoskeletal: Negative for gait problem and right knee joint swelling.  Skin: Negative for rash.  Neurological: Negative for dizziness or headache.  No other specific complaints in a complete review of systems (except as listed in HPI above).   Objective  Vitals:   07/26/20 1124  BP: 130/86  Pulse: 86  Resp: 16  Temp: 98.2 F (36.8 C)  TempSrc: Oral  SpO2: 97%  Weight: 198 lb (89.8 kg)  Height: 5\' 7"  (1.702 m)    Body mass index is 31.01 kg/m.  Physical Exam  Constitutional: Patient appears well-developed and well-nourished. Obese  No distress.  HEENT: head atraumatic, normocephalic, pupils equal and reactive to light, neck supple Cardiovascular: Normal rate, regular rhythm and normal heart sounds.  No murmur heard.  Trace BLE edema, stasis dermatitis and also some varicose veins on right lower leg . Pulmonary/Chest:  Effort normal and breath sounds normal. No respiratory distress. Abdominal: Soft.  There is no tenderness. Muscular skeletal: left knee decrease in extension, mild effusion, not redness  Psychiatric: Patient has a normal mood and affect. behavior is normal. Judgment and thought content normal.   Recent Results (from the past 2160 hour(s))  POCT HgB A1C     Status: Abnormal   Collection Time: 07/26/20 11:28 AM  Result Value Ref Range   Hemoglobin A1C 5.8 (A) 4.0 - 5.6 %   HbA1c POC (<> result, manual entry)     HbA1c, POC (prediabetic range)     HbA1c, POC (controlled diabetic range)       PHQ2/9: Depression screen [facility] 2/9 07/26/2020 04/27/2020 01/22/2020 10/15/2019 07/22/2019  Decreased Interest 0 0 0 0 0  Down, Depressed, Hopeless 0 0 0 0 0  PHQ - 2 Score 0 0 0 0 0  Altered sleeping - - - - 0  Tired, decreased energy - - - - 0  Change in appetite - - - - 0  Feeling bad or failure about yourself  - - - - 0  Trouble concentrating - - - - 0  Moving slowly or fidgety/restless - - - - 0  Suicidal thoughts - - - - 0  PHQ-9 Score - - - - 0  Difficult doing work/chores - - - - -  Some recent data might be hidden    phq 9 is negative   Fall Risk: Fall Risk  07/26/2020 04/27/2020 01/22/2020 10/15/2019 08/11/2019  Falls in the past year? 0 0 0 0 0  Number falls in past yr: 0 0 0 0 -  Injury with Fall? 0 0 0 0 -  Risk for fall due to : - - - No Fall Risks -  Risk for fall due to: Comment - - - - -  Follow up - - Falls evaluation completed Falls prevention discussed -     Functional Status Survey: Is the patient deaf or have difficulty hearing?: No Does the patient have difficulty seeing, even when wearing glasses/contacts?: No Does the patient have difficulty concentrating, remembering, or making decisions?: No Does the patient have difficulty walking or climbing stairs?: No Does the patient have difficulty dressing or bathing?: No Does the patient have difficulty doing errands alone  such as visiting a doctor's office or shopping?: No    Assessment & Plan  1. Hypertension associated with type 2 diabetes mellitus (HCC)  - POCT HgB A1C - Microalbumin / creatinine urine ratio - COMPLETE METABOLIC PANEL WITH GFR - CBC with Differential/Platelet - triamterene-hydrochlorothiazide (MAXZIDE-25) 37.5-25 MG tablet; Take 1 tablet by mouth daily.  Dispense: 90 tablet; Refill: 0  2. Stasis dermatitis of both legs   3. Hyperlipidemia associated with type 2 diabetes mellitus ([place])   4. Primary osteoarthritis of left knee   5. Vitamin D deficiency   6. Dyslipidemia associated with type 2 diabetes mellitus ([place])  - Lipid panel  7. Bilateral lower extremity edema  - Ambulatory referral to Vascular Surgery

## 2020-07-26 ENCOUNTER — Ambulatory Visit (INDEPENDENT_AMBULATORY_CARE_PROVIDER_SITE_OTHER): Payer: Medicare PPO | Admitting: Family Medicine

## 2020-07-26 ENCOUNTER — Other Ambulatory Visit: Payer: Self-pay

## 2020-07-26 ENCOUNTER — Encounter: Payer: Self-pay | Admitting: Family Medicine

## 2020-07-26 VITALS — BP 130/86 | HR 86 | Temp 98.2°F | Resp 16 | Ht 67.0 in | Wt 198.0 lb

## 2020-07-26 DIAGNOSIS — M1712 Unilateral primary osteoarthritis, left knee: Secondary | ICD-10-CM | POA: Diagnosis not present

## 2020-07-26 DIAGNOSIS — E559 Vitamin D deficiency, unspecified: Secondary | ICD-10-CM

## 2020-07-26 DIAGNOSIS — E1169 Type 2 diabetes mellitus with other specified complication: Secondary | ICD-10-CM | POA: Diagnosis not present

## 2020-07-26 DIAGNOSIS — E785 Hyperlipidemia, unspecified: Secondary | ICD-10-CM

## 2020-07-26 DIAGNOSIS — I872 Venous insufficiency (chronic) (peripheral): Secondary | ICD-10-CM

## 2020-07-26 DIAGNOSIS — I152 Hypertension secondary to endocrine disorders: Secondary | ICD-10-CM

## 2020-07-26 DIAGNOSIS — R600 Localized edema: Secondary | ICD-10-CM

## 2020-07-26 DIAGNOSIS — E1159 Type 2 diabetes mellitus with other circulatory complications: Secondary | ICD-10-CM | POA: Diagnosis not present

## 2020-07-26 LAB — POCT GLYCOSYLATED HEMOGLOBIN (HGB A1C): Hemoglobin A1C: 5.8 % — AB (ref 4.0–5.6)

## 2020-07-26 MED ORDER — TRIAMTERENE-HCTZ 37.5-25 MG PO TABS: 1.0000 | ORAL_TABLET | Freq: Every day | ORAL | 0 refills | Status: DC

## 2020-07-27 LAB — COMPLETE METABOLIC PANEL WITH GFR
AG Ratio: 1.3 (calc) (ref 1.0–2.5)
ALT: 15 U/L (ref 6–29)
AST: 16 U/L (ref 10–35)
Albumin: 4.1 g/dL (ref 3.6–5.1)
Alkaline phosphatase (APISO): 47 U/L (ref 37–153)
BUN: 15 mg/dL (ref 7–25)
CO2: 30 mmol/L (ref 20–32)
Calcium: 9.5 mg/dL (ref 8.6–10.4)
Chloride: 103 mmol/L (ref 98–110)
Creat: 0.81 mg/dL (ref 0.60–1.00)
Globulin: 3.1 g/dL (calc) (ref 1.9–3.7)
Glucose, Bld: 93 mg/dL (ref 65–99)
Potassium: 4.2 mmol/L (ref 3.5–5.3)
Sodium: 140 mmol/L (ref 135–146)
Total Bilirubin: 0.6 mg/dL (ref 0.2–1.2)
Total Protein: 7.2 g/dL (ref 6.1–8.1)
eGFR: 77 mL/min/{1.73_m2} (ref >=60)

## 2020-07-27 LAB — LIPID PANEL
Cholesterol: 147 mg/dL (ref <200)
HDL: 76 mg/dL (ref >=50)
LDL Cholesterol (Calc): 57 mg/dL (calc)
Non-HDL Cholesterol (Calc): 71 mg/dL (calc) (ref <130)
Total CHOL/HDL Ratio: 1.9 (calc) (ref <5.0)
Triglycerides: 65 mg/dL (ref <150)

## 2020-07-27 LAB — CBC WITH DIFFERENTIAL/PLATELET
Absolute Monocytes: 299 cells/uL (ref 200–950)
Basophils Absolute: 31 cells/uL (ref 0–200)
Basophils Relative: 0.9 %
Eosinophils Absolute: 190 cells/uL (ref 15–500)
Eosinophils Relative: 5.6 %
HCT: 37.6 % (ref 35.0–45.0)
Hemoglobin: 12.3 g/dL (ref 11.7–15.5)
Lymphs Abs: 1482 cells/uL (ref 850–3900)
MCH: 28.4 pg (ref 27.0–33.0)
MCHC: 32.7 g/dL (ref 32.0–36.0)
MCV: 86.8 fL (ref 80.0–100.0)
MPV: 10.3 fL (ref 7.5–12.5)
Monocytes Relative: 8.8 %
Neutro Abs: 1397 cells/uL — ABNORMAL LOW (ref 1500–7800)
Neutrophils Relative %: 41.1 %
Platelets: 247 10*3/uL (ref 140–400)
RBC: 4.33 10*6/uL (ref 3.80–5.10)
RDW: 13.1 % (ref 11.0–15.0)
Total Lymphocyte: 43.6 %
WBC: 3.4 10*3/uL — ABNORMAL LOW (ref 3.8–10.8)

## 2020-07-27 LAB — MICROALBUMIN / CREATININE URINE RATIO
Creatinine, Urine: 74 mg/dL (ref 20–275)
Microalb Creat Ratio: 3 mcg/mg creat (ref <30)
Microalb, Ur: 0.2 mg/dL

## 2020-08-03 ENCOUNTER — Ambulatory Visit
Admission: RE | Admit: 2020-08-03 | Discharge: 2020-08-03 | Disposition: A | Discharge status: Home / Self Care | Payer: Medicare PPO | Source: Ambulatory Visit | Attending: Family Medicine | Admitting: Family Medicine

## 2020-08-03 ENCOUNTER — Other Ambulatory Visit: Payer: Self-pay

## 2020-08-03 DIAGNOSIS — Z1231 Encounter for screening mammogram for malignant neoplasm of breast: Secondary | ICD-10-CM

## 2020-08-03 DIAGNOSIS — Z780 Asymptomatic menopausal state: Secondary | ICD-10-CM | POA: Insufficient documentation

## 2020-08-04 ENCOUNTER — Other Ambulatory Visit: Payer: Self-pay | Admitting: Family Medicine

## 2020-08-04 DIAGNOSIS — N6489 Other specified disorders of breast: Secondary | ICD-10-CM

## 2020-08-04 DIAGNOSIS — R928 Other abnormal and inconclusive findings on diagnostic imaging of breast: Secondary | ICD-10-CM

## 2020-08-11 ENCOUNTER — Other Ambulatory Visit: Payer: Self-pay | Admitting: Family Medicine

## 2020-08-11 ENCOUNTER — Ambulatory Visit
Admission: RE | Admit: 2020-08-11 | Discharge: 2020-08-11 | Disposition: A | Discharge status: Home / Self Care | Payer: Medicare PPO | Source: Ambulatory Visit | Attending: Family Medicine | Admitting: Family Medicine

## 2020-08-11 ENCOUNTER — Other Ambulatory Visit: Payer: Self-pay

## 2020-08-11 DIAGNOSIS — N6489 Other specified disorders of breast: Secondary | ICD-10-CM | POA: Diagnosis not present

## 2020-08-11 DIAGNOSIS — R928 Other abnormal and inconclusive findings on diagnostic imaging of breast: Secondary | ICD-10-CM | POA: Diagnosis not present

## 2020-08-11 DIAGNOSIS — R922 Inconclusive mammogram: Secondary | ICD-10-CM | POA: Diagnosis not present

## 2020-08-11 DIAGNOSIS — N6320 Unspecified lump in the left breast, unspecified quadrant: Secondary | ICD-10-CM

## 2020-08-12 ENCOUNTER — Other Ambulatory Visit: Payer: Self-pay | Admitting: Family Medicine

## 2020-08-12 DIAGNOSIS — R928 Other abnormal and inconclusive findings on diagnostic imaging of breast: Secondary | ICD-10-CM

## 2020-08-15 ENCOUNTER — Other Ambulatory Visit: Payer: Self-pay

## 2020-08-15 ENCOUNTER — Encounter (INDEPENDENT_AMBULATORY_CARE_PROVIDER_SITE_OTHER): Payer: Self-pay | Admitting: Nurse Practitioner

## 2020-08-15 ENCOUNTER — Telehealth: Payer: Self-pay | Admitting: Family Medicine

## 2020-08-15 ENCOUNTER — Ambulatory Visit (INDEPENDENT_AMBULATORY_CARE_PROVIDER_SITE_OTHER): Payer: Medicare PPO | Admitting: Nurse Practitioner

## 2020-08-15 VITALS — BP 173/79 | HR 84 | Ht 67.0 in | Wt 199.0 lb

## 2020-08-15 DIAGNOSIS — E119 Type 2 diabetes mellitus without complications: Secondary | ICD-10-CM

## 2020-08-15 DIAGNOSIS — Z9889 Other specified postprocedural states: Secondary | ICD-10-CM | POA: Diagnosis not present

## 2020-08-15 DIAGNOSIS — R600 Localized edema: Secondary | ICD-10-CM

## 2020-08-15 DIAGNOSIS — I10 Essential (primary) hypertension: Secondary | ICD-10-CM | POA: Diagnosis not present

## 2020-08-15 DIAGNOSIS — M7989 Other specified soft tissue disorders: Secondary | ICD-10-CM

## 2020-08-15 NOTE — Progress Notes (Signed)
Subjective:    Patient ID: Stacy [name], female    DOB: [birth year], 75 y.o.   MRN: [id number] Chief Complaint  Patient presents with   New Patient (Initial Visit)    Np sowles ble edema    Stacy [name] is a 75 year old female that is seen for evaluation of leg swelling, she is referred by her primary care physician Dr. Ancil Boozer.  The patient first noticed the swelling remotely but is now concerned because of a significant increase in the overall edema. The swelling is associated with pain and discoloration. The patient notes that in the morning the legs are significantly improved but they steadily worsened throughout the course of the day. Elevation makes the legs better, dependency makes them much worse.   There is no history of ulcerations associated with the swelling.   The patient denies any recent changes in their medications.  The patient has not been however not consistently.  The patient has no had any past angiography, interventions or vascular surgery.  The patient denies a history of DVT or PE. There is no prior history of phlebitis. There is no history of primary lymphedema.  There is no history of radiation treatment to the groin or pelvis No history of malignancies. No history of trauma or groin or pelvic surgery. No history of foreign travel or parasitic infections area      Review of Systems  Cardiovascular:  Positive for leg swelling.  Skin:  Negative for wound.  All other systems reviewed and are negative.     Objective:   Physical Exam Vitals reviewed.  HENT:     Head: Normocephalic.  Cardiovascular:     Rate and Rhythm: Normal rate.     Pulses: Normal pulses.  Pulmonary:     Effort: Pulmonary effort is normal.  Musculoskeletal:     Right lower leg: Edema present.     Left lower leg: Edema present.  Neurological:     Mental Status: She is alert and oriented to person, place, and time.  Psychiatric:        Mood and Affect: Mood normal.         Behavior: Behavior normal.        Thought Content: Thought content normal.        Judgment: Judgment normal.    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  Tobacco Use   Smoking status: Never   Smokeless tobacco: Never   Tobacco comments:    smoking cessation materials not required  Vaping Use   Vaping Use: Never used  Substance and Sexual Activity   Alcohol use: No    Alcohol/week: 0.0 standard drinks   Drug use: No   Sexual activity: Not Currently    Comment: Husband has prostate issues  Other Topics Concern   Not on file  Social History Narrative   Not on file  Social Determinants of Health   Financial Resource Strain: Low Risk    Difficulty of Paying Living Expenses: Not hard at all  Food Insecurity: No Food Insecurity   Worried About Charity fundraiser in the Last Year: Never true   [place] in the Last Year: Never true  Transportation Needs: No Transportation Needs   Lack of Transportation (Medical): No   Lack of Transportation (Non-Medical): No  Physical Activity: Inactive   Days of Exercise per Week: 0 days   Minutes of Exercise per Session: 0 min  Stress: No Stress Concern Present   Feeling of Stress : Not at all  Social Connections: Socially Integrated   Frequency of Communication with Friends and Family: Twice a week   Frequency of Social Gatherings with Friends and Family: Once a week   Attends Religious Services: More than 4 times per year   Active Member of [employer] or Organizations: Yes   Attends Theatre manager Meetings: 1 to 4 times per year   Marital Status: Married  Human resources officer Violence: Not At Risk   Fear of Current or Ex-Partner: No   Emotionally Abused: No   Physically Abused: No   Sexually Abused: No    Past Surgical History:  Procedure Laterality Date   BREAST BIOPSY Right 2015   3 stereo biopsies. 2 benign. one was radial scar, no excision done for radial scar   BREAST BIOPSY Left 07/16/2017   BIPHASIC STROMAL AND EPITHELIAL LESION./ Dr Bary Castilla   BREAST EXCISIONAL BIOPSY Left 1968   x 3 per pt   BREAST EXCISIONAL BIOPSY Right 2007   benign   COLONOSCOPY  2011   DR,ISHAKIS   FOOT SURGERY  2004   HEEL SPUR SURGERY Left 12/15/2014   Dr[facility] Earleen Newport at [facility]    Family History  Problem Relation Age of Onset   Breast cancer Sister 47   Arthritis Mother    Stroke Paternal Grandmother    Cancer Brother 96       kidney   Cancer Maternal Grandmother        Gallbladder    Allergies  Allergen Reactions   Ramipril Swelling and Other (See Comments)    CBC Latest Ref Rng & Units 07/26/2020 07/22/2019 07/22/2018  WBC 3.8 - 10.8 Thousand/uL 3.4(L) 3.7(L) 4.1  Hemoglobin 11.7 - 15.5 g/dL 12.3 12.9 12.2  Hematocrit 35.0 - 45.0 % 37.6 40.1 36.5  Platelets 140 - 400 Thousand/uL 247 258 258      CMP     Component Value Date/Time   NA 140 07/26/2020 1202   NA 143 04/06/2015 0902   K 4.2 07/26/2020 1202   CL 103 07/26/2020 1202   CO2 30 07/26/2020 1202   GLUCOSE 93 07/26/2020 1202   BUN 15 07/26/2020 1202   BUN 20 04/06/2015 0902   CREATININE 0.81 07/26/2020 1202   CALCIUM 9.5 07/26/2020 1202   PROT 7.2 07/26/2020 1202   PROT 7.2 04/06/2015 0902   ALBUMIN 4.2 10/10/2015 0943   ALBUMIN 4.0 04/06/2015 0902   AST 16 07/26/2020 1202   ALT 15 07/26/2020 1202   ALKPHOS 51 10/10/2015 0943   BILITOT 0.6 07/26/2020 1202   BILITOT 0.3 04/06/2015 0902   GFRNONAA 64 07/22/2019 1110   GFRAA 74 07/22/2019 1110     No results found.     Assessment &  Plan:   1. Leg swelling I have had a long discussion with the patient regarding swelling and why it  causes symptoms.  Patient will begin wearing graduated compression stockings class 1 (20-30 mmHg) on a daily basis a prescription was given. The patient will  beginning wearing the stockings first thing in the morning and removing them in the evening. The patient is instructed specifically not to sleep in the stockings.   In addition, behavioral modification will be initiated.  This will include frequent elevation, use of over the counter pain medications and exercise such as walking.  I have reviewed systemic causes for chronic edema such as liver, kidney and cardiac etiologies.  The patient denies problems with these organ systems.    Consideration for a lymph pump will also be made based upon the effectiveness of conservative therapy.  This would help to improve the edema control and prevent sequela such as ulcers and infections   Patient should undergo duplex ultrasound of the venous system to ensure that DVT or reflux is not present.  The patient will follow-up with me after the ultrasound.    2. Diabetes mellitus type 2, diet-controlled ([place]) Continue hypoglycemic medications as already ordered, these medications have been reviewed and there are no changes at this time.  Hgb A1C to be monitored as already arranged by primary service   3. History of foot surgery This could also be a contributing cause to the patient's lower extremity edema due to possible lymphatic damage  4. Hypertension, benign Continue antihypertensive medications as already ordered, these medications have been reviewed and there are no changes at this time.    Current Outpatient Medications on File Prior to Visit  Medication Sig Dispense Refill   Cholecalciferol (VITAMIN D-3 PO) Take 1,000 Units by mouth daily.      Cinnamon 500 MG capsule Take 1,000 mg by mouth daily.     FISH OIL-BORAGE-FLAX-SAFFLOWER PO Take  2 tablets by mouth daily.     fluticasone (FLONASE) 50 MCG/ACT nasal spray Place 2 sprays into both nostrils daily. 48 g 1   ketoconazole (NIZORAL) 2 % cream Apply 1 application topically daily. (Patient taking differently: Apply 1 application topically daily as needed.) 60 g 0   loratadine (CLARITIN) 10 MG tablet Take 1 tablet (10 mg total) by mouth daily. 90 tablet 1   meloxicam (MOBIC) 7.5 MG tablet Take 7.5 mg by mouth daily.     Multiple Vitamins-Minerals (MULTIVITAMIN GUMMIES ADULT PO) Take by mouth daily.     olopatadine (PATANOL) 0.1 % ophthalmic solution 1 drop 2 (two) times daily.     rosuvastatin (CRESTOR) 5 MG tablet TAKE 1 TABLET BY MOUTH 3 TIMES A WEEK 36 tablet 0   triamterene-hydrochlorothiazide (MAXZIDE-25) 37.5-25 MG tablet Take 1 tablet by mouth daily. 90 tablet 0   No current facility-administered medications on file prior to visit.    There are no Patient Instructions on file for this visit. No follow-ups on file.   Kris Hartmann, NP

## 2020-08-18 ENCOUNTER — Telehealth: Payer: Self-pay

## 2020-08-18 NOTE — Progress Notes (Signed)
Chronic Care Management Pharmacy Assistant   Name: Stacy [name]  MRN: [id number] DOB: 09-06-1945  Reason for Encounter:  Hypertension Disease State Call   Recent office visits:  07/26/2020 Steele Sizer, MD (PCP Office Visit)- for follow-up- no medication changes noted, referral for Vascular Surgery ordered; labs ordered; patient instructed to return in 4 months  Recent consult visits:  08/15/2020 [name], NP (Vein and Vascular Surgery) for NP Initial Visit Leg Swelling- No medications changed. Rx given for compression stockings; no follow-up's noted  Hospital visits:  None in previous 6 months  Medications: Outpatient Encounter Medications as of 08/18/2020  Medication Sig   Cholecalciferol (VITAMIN D-3 PO) Take 1,000 Units by mouth daily.    Cinnamon 500 MG capsule Take 1,000 mg by mouth daily.   FISH OIL-BORAGE-FLAX-SAFFLOWER PO Take 2 tablets by mouth daily.   fluticasone (FLONASE) 50 MCG/ACT nasal spray Place 2 sprays into both nostrils daily.   ketoconazole (NIZORAL) 2 % cream Apply 1 application topically daily. (Patient taking differently: Apply 1 application topically daily as needed.)   loratadine (CLARITIN) 10 MG tablet Take 1 tablet (10 mg total) by mouth daily.   meloxicam (MOBIC) 7.5 MG tablet Take 7.5 mg by mouth daily.   Multiple Vitamins-Minerals (MULTIVITAMIN GUMMIES ADULT PO) Take by mouth daily.   olopatadine (PATANOL) 0.1 % ophthalmic solution 1 drop 2 (two) times daily.   rosuvastatin (CRESTOR) 5 MG tablet TAKE 1 TABLET BY MOUTH 3 TIMES A WEEK   triamterene-hydrochlorothiazide (MAXZIDE-25) 37.5-25 MG tablet Take 1 tablet by mouth daily.   No facility-administered encounter medications on file as of 08/18/2020.   Care Gaps: Zoster Vaccines- Shingrix TETANUS/TDAP COVID-19 Vaccine Booster 3 INFLUENZA VACCINE  Star Rating Drugs: Rosuvastatin 5 mg last filled on 06/28/2020 for a 84-Day supply with [facility]  Recent Office Vitals: BP  Readings from Last 3 Encounters:  08/15/20 (!) 173/79  07/26/20 130/86  04/27/20 132/84   Pulse Readings from Last 3 Encounters:  08/15/20 84  07/26/20 86  04/27/20 88    Wt Readings from Last 3 Encounters:  08/15/20 199 lb (90.3 kg)  07/26/20 198 lb (89.8 kg)  04/27/20 195 lb (88.5 kg)     Kidney Function Lab Results  Component Value Date/Time   CREATININE 0.81 07/26/2020 12:02 PM   CREATININE 0.90 07/22/2019 11:10 AM   GFRNONAA 64 07/22/2019 11:10 AM   GFRAA 74 07/22/2019 11:10 AM    BMP Latest Ref Rng & Units 07/26/2020 08/03/2019 07/22/2019  Glucose 65 - 99 mg/dL 93 - 101(H)  BUN 7 - 25 mg/dL 15 - 13  Creatinine 0.60 - 1.00 mg/dL 0.81 - 0.90  BUN/Creat Ratio 6 - 22 (calc) NOT APPLICABLE - NOT APPLICABLE  Sodium A999333 - 146 mmol/L 140 - 141  Potassium 3.5 - 5.3 mmol/L 4.2 4.9 5.7(H)  Chloride 98 - 110 mmol/L 103 - 103  CO2 20 - 32 mmol/L 30 - 34(H)  Calcium 8.6 - 10.4 mg/dL 9.5 - 10.3   Reviewed chart prior to disease state call. Spoke with patient regarding BP  Current antihypertensive regimen:  Triamterene-HCTZ 37.5- 25 mg 1 tablet daily patient states that she does take the medication she may skip a day here and there, but denies any symptoms related to elevated blood pressure, and she states that she was informed that the pain she is experiencing could be the cause of the high numbers she has at times when going to the providers office.  How often are you checking  your Blood Pressure?  Patient states she doesn't check it at home  Current home BP readings: Patient had a Vein and Vascular provider on 08/01 and it was 173/79   What recent interventions/DTPs have been made by any provider to improve Blood Pressure control since last CPP Visit: None ID  Any recent hospitalizations or ED visits since last visit with CPP? No  What diet changes have been made to improve Blood Pressure Control?  Patient states she try to use a less sodium season blend. She states she try to  not use salt but she does not have a particular diet she is on.   What exercise is being done to improve your Blood Pressure Control?  Patient is experiencing knee pain at this time so it limits her in her mobility. She states she does like working in her garden, but has been unable to. The patient denies any falls and states she does have a cane to use when needed. She has been advised by Ortho that she will need a knee replacement. She is taking OTC Tylenol Arthritis and Meloxicam to help combat the pain at this time. She hasn't followed up with Ortho yet to schedule surgery for her knee as she stated she is having some dental work done and wanted to complete that process first. The patient has also been experiencing some leg swelling and recently saw a Vein and Vascular specialist who advised her to elevate as much as possible and to wear compression stockings during the day.  Adherence Review: Is the patient currently on ACE/ARB medication? No Does the patient have >5 day gap between last estimated fill dates? No   Patient has scheduled follow-up appointment with Junius Argyle, CPP on 12/28/2020 @ 0800 Patient has scheduled AWV on 10/18/2020 @ 0900  [name], CPA/CMA Clinical Pharmacist Assistant Phone: [phone]

## 2020-08-23 ENCOUNTER — Other Ambulatory Visit: Payer: Self-pay | Admitting: Family Medicine

## 2020-08-23 ENCOUNTER — Ambulatory Visit
Admission: RE | Admit: 2020-08-23 | Discharge: 2020-08-23 | Disposition: A | Discharge status: Home / Self Care | Payer: Medicare PPO | Source: Ambulatory Visit | Attending: Family Medicine | Admitting: Family Medicine

## 2020-08-23 ENCOUNTER — Other Ambulatory Visit: Payer: Self-pay

## 2020-08-23 DIAGNOSIS — N6323 Unspecified lump in the left breast, lower outer quadrant: Secondary | ICD-10-CM | POA: Diagnosis not present

## 2020-08-23 DIAGNOSIS — N6321 Unspecified lump in the left breast, upper outer quadrant: Secondary | ICD-10-CM | POA: Insufficient documentation

## 2020-08-23 DIAGNOSIS — N6320 Unspecified lump in the left breast, unspecified quadrant: Secondary | ICD-10-CM

## 2020-08-23 DIAGNOSIS — R928 Other abnormal and inconclusive findings on diagnostic imaging of breast: Secondary | ICD-10-CM

## 2020-08-23 HISTORY — PX: BREAST BIOPSY: SHX20

## 2020-08-24 LAB — SURGICAL PATHOLOGY

## 2020-08-25 ENCOUNTER — Encounter: Payer: Self-pay | Admitting: *Deleted

## 2020-08-25 NOTE — Progress Notes (Signed)
Received message from Electa Sniff, RN that patient had been informed of her benign biopsy and need for surgical consult and is ready for navigation to call her.  Patient states she has already been scheduled to see Dr. Collene Schlichter for her surgical consult.  She was encouraged to call with any further needs.

## 2020-09-01 ENCOUNTER — Other Ambulatory Visit: Payer: Self-pay | Admitting: General Surgery

## 2020-09-01 DIAGNOSIS — N6323 Unspecified lump in the left breast, lower outer quadrant: Secondary | ICD-10-CM | POA: Diagnosis not present

## 2020-09-01 DIAGNOSIS — D4862 Neoplasm of uncertain behavior of left breast: Secondary | ICD-10-CM | POA: Diagnosis not present

## 2020-09-01 NOTE — Progress Notes (Signed)
Subjective:     Patient ID: Stacy [name] is a 75 y.o. female.   HPI   The following portions of the patient's history were reviewed and updated as appropriate.   This an established patient is here today for: office visit. Patient is here today to follow up from a recent mammogram and breast biopsy done at [facility] on 08-23-20.  She states this was a routine mammogram, no breast injury or trauma. She states she did have some bruising from the biopsy.   She is here with her husband, Delfino Lovett. She would had her daughter on the phone, Floydene Flock for discussion.        Chief Complaint  Patient presents with   Follow-up      BP (!) 144/68   Pulse 87   Temp 36.3 C (97.4 F)   Ht 170.2 cm ('5\' 7"'$ )   Wt 89.8 kg (198 lb)   LMP  (LMP Unknown)   SpO2 94%   BMI 31.01 kg/m        Past Medical History:  Diagnosis Date   Allergic rhinitis     Diabetes mellitus without complication (CMS-HCC)     Family history of breast cancer     Hyperlipidemia     Hypertension     Left Achilles tendinitis     Onychomycosis of multiple toenails with type 2 diabetes mellitus (CMS-HCC)     Varicose veins of both lower extremities     Vitamin D deficiency             Past Surgical History:  Procedure Laterality Date   BREAST EXCISIONAL BIOPSY Right 2015    3 stereo biopsies-2 benign, 1 radialscar-no excision done for radial scar   BREAST EXCISIONAL BIOPSY Left 08/23/2020   BREAST EXCISIONAL BIOPSY Right 2007   BREAST EXCISIONAL BIOPSY Left 1968   bunionectomy       COLONOSCOPY   2011   foot surgery   2004   heel spur Left 12/15/2014   INCISIONAL BIOPSY BREAST Left 07/16/2017                OB History     Gravida  1   Para  1   Term      Preterm      AB      Living         SAB      IAB      Ectopic      Molar      Multiple      Live Births           Obstetric Comments  Age at first period 59 Age of first pregnancy 65               Social History           Socioeconomic History   Marital status: Widowed  Tobacco Use   Smoking status: Never Smoker   Smokeless tobacco: Never Used  Vaping Use   Vaping Use: Never used  Substance and Sexual Activity   Alcohol use: Never   Drug use: Defer   Sexual activity: Defer            Allergies  Allergen Reactions   Altace [Ramipril] Angioedema      Current Medications        Current Outpatient Medications  Medication Sig Dispense Refill   aspirin 81 MG EC tablet Take 81 mg by mouth once daily  cholecalciferol (VITAMIN D3) 1000 unit tablet Take 1,000 Units by mouth once daily       cinnamon bark 500 mg capsule Take 2 capsules by mouth once daily       fish,flaxseed oil-e.prim-bcurr 400-400-200 mg Cap Take 1 capsule by mouth once daily       fluticasone propionate (FLONASE) 50 mcg/actuation nasal spray Place 2 sprays into both nostrils once daily as needed       ketoconazole (NIZORAL) 2 % cream Apply topically once daily       loratadine (CLARITIN) 10 mg tablet Take 10 mg by mouth once daily       meloxicam (MOBIC) 7.5 MG tablet Take 1 tablet (7.5 mg total) by mouth once daily 30 tablet 5   olopatadine (PATANOL) 0.1 % ophthalmic solution Place 1 drop into both eyes 2 (two) times daily       rosuvastatin (CRESTOR) 5 MG tablet Take 1 tablet by mouth 3 (three) times daily       tobramycin (TOBREX) 0.3 % ophthalmic solution Place 2 drops into both eyes every 6 (six) hours       triamterene-hydrochlorothiazide (MAXZIDE-25) 37.5-25 mg tablet Take 1 tablet by mouth once daily       EUA COVID-19 vaccine, mRNA, PF, (MODERNA COVID-19 VACCINE) 100 mcg/0.5 mL IM injection  (Patient not taking: Reported on 09/01/2020)        No current facility-administered medications for this visit.             Family History  Problem Relation Age of Onset   Arthritis Mother     Breast cancer Sister 38   Kidney cancer Brother 47   Cancer Maternal Grandmother          gallbladder   Stroke Paternal Grandmother              Review of Systems  Constitutional: Negative for chills and fever.  Respiratory: Negative for cough.          Objective:   Physical Exam Exam conducted with a chaperone present.  Constitutional:      Appearance: Normal appearance.  Cardiovascular:     Rate and Rhythm: Normal rate and regular rhythm.     Pulses: Normal pulses.     Heart sounds: Normal heart sounds.  Pulmonary:     Effort: Pulmonary effort is normal.     Breath sounds: Normal breath sounds.  Musculoskeletal:     Cervical back: Neck supple.  Skin:    General: Skin is warm and dry.  Neurological:     Mental Status: She is alert and oriented to person, place, and time.  Psychiatric:        Mood and Affect: Mood normal.        Behavior: Behavior normal.      Labs and Radiology:  Mammogram review:   Mammograms from August 03, 2017 through August 2022 were independently reviewed.   May 25, 2013 right breast biopsy results:   Diagnosis:  Part A: RIGHT BREAST BIOPSY ANTERIOR:  - COLUMNAR AND APOCRINE CHANGE WITH MICROCALCIFICATIONS.  - USUAL DUCTAL HYPERPLASIA.  - NEGATIVE FOR ATYPIA AND MALIGNANCY.  .  Part B: RIGHT BREAST BIOPSY INFERIOR:  - COLUMNAR CHANGE WITH MICROCALCIFICATIONS.  - APOCRINE METAPLASIA.  - NEGATIVE FOR ATYPIA AND MALIGNANCY.  .  Part C: RIGHT BREAST BIOPSY POSTERIOR:  - FEATURES CONSISTENT WITH RADIAL SCAR.  - COLUMNAR CHANGE AND USUAL DUCTAL HYPERPLASIA WITH  MICROCALCIFICATIONS.  - APOCRINE METAPLASIA.  - NEGATIVE FOR  ATYPIA AND MALIGNANCY.   July 16, 2017 left breast biopsy, 5:00    Breast, left, needle core biopsy, 5 o'clock - BIPHASIC STROMAL AND EPITHELIAL LESION. - SEE MICROSCOPIC DESCRIPTION The differential includes a low grade phyllodes tumor and fibroadenoma.   This was a 1 cm lesion that was completely removed during vacuum biopsy.   August 23, 2020: DIAGNOSIS:  A. BREAST, LEFT, 5 O'CLOCK 2 CM FROM NIPPLE; ULTRASOUND-GUIDED CORE  BIOPSY:  -  FIBROEPITHELIAL LESION WITH INTRACANALICULAR GROWTH PATTERN AND  VARIABLE STROMAL HYPERCELLULARITY, SEE COMMENT.   Comment:  The histologic features raise concern for a low-grade phyllodes tumor.  No high-grade features are seen in this sample, but final classification  should be based on the completely excised lesion.    Ultrasound:   Ultrasound examination of the left breast was undertaken to determine if preoperative wire localization would be required.  There is a well-circumscribed mass in the superficial tissue plane measuring 0.7 x 0.9 x 1.15 cm.  A biopsy clip is clearly visible within the lesion.  This is at the 5 o'clock position of the left breast about 5 cm from the nipple.   On her postbiopsy mammograms both the ribbon clip placed at the time of the 2019 vacuum biopsy and the coil clip from her August 2022 biopsy are evident.  These are within 1 cm of each other.   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    Plan:     Indications for formal surgical excision were reviewed.   The area of radial scar identified in 2015 is unchanged over the last 7 years and does not warrant surgical excision.   The patient's daughter was involved in the post exam conversation by phone. Patient to be scheduled for surgery on a convenient date. It is okay for the patient to continue on an 81 mg aspirin once daily.    This note is partially prepared by Ledell Noss, CMA acting as a scribe in the presence of Dr. Hervey Ard, MD.    The documentation recorded by the scribe accurately reflects the service I personally performed and the decisions made by me.    Robert Bellow, MD FACS

## 2020-09-02 ENCOUNTER — Other Ambulatory Visit (INDEPENDENT_AMBULATORY_CARE_PROVIDER_SITE_OTHER): Payer: Self-pay | Admitting: Nurse Practitioner

## 2020-09-02 DIAGNOSIS — M7989 Other specified soft tissue disorders: Secondary | ICD-10-CM

## 2020-09-08 ENCOUNTER — Other Ambulatory Visit: Payer: Self-pay

## 2020-09-08 ENCOUNTER — Ambulatory Visit (INDEPENDENT_AMBULATORY_CARE_PROVIDER_SITE_OTHER): Payer: Medicare PPO | Admitting: Nurse Practitioner

## 2020-09-08 ENCOUNTER — Ambulatory Visit (INDEPENDENT_AMBULATORY_CARE_PROVIDER_SITE_OTHER): Payer: Medicare PPO

## 2020-09-08 VITALS — BP 166/80 | HR 71 | Ht 67.0 in | Wt 197.0 lb

## 2020-09-08 DIAGNOSIS — E785 Hyperlipidemia, unspecified: Secondary | ICD-10-CM | POA: Diagnosis not present

## 2020-09-08 DIAGNOSIS — R600 Localized edema: Secondary | ICD-10-CM | POA: Diagnosis not present

## 2020-09-08 DIAGNOSIS — E119 Type 2 diabetes mellitus without complications: Secondary | ICD-10-CM

## 2020-09-08 DIAGNOSIS — E1169 Type 2 diabetes mellitus with other specified complication: Secondary | ICD-10-CM

## 2020-09-08 DIAGNOSIS — M7989 Other specified soft tissue disorders: Secondary | ICD-10-CM

## 2020-09-13 ENCOUNTER — Other Ambulatory Visit: Payer: Medicare PPO

## 2020-09-15 ENCOUNTER — Ambulatory Visit (INDEPENDENT_AMBULATORY_CARE_PROVIDER_SITE_OTHER): Payer: Medicare PPO | Admitting: Podiatry

## 2020-09-15 ENCOUNTER — Encounter: Payer: Self-pay | Admitting: Podiatry

## 2020-09-15 ENCOUNTER — Other Ambulatory Visit: Payer: Self-pay

## 2020-09-15 DIAGNOSIS — M79674 Pain in right toe(s): Secondary | ICD-10-CM

## 2020-09-15 DIAGNOSIS — M79675 Pain in left toe(s): Secondary | ICD-10-CM | POA: Diagnosis not present

## 2020-09-15 DIAGNOSIS — E119 Type 2 diabetes mellitus without complications: Secondary | ICD-10-CM

## 2020-09-15 DIAGNOSIS — B351 Tinea unguium: Secondary | ICD-10-CM | POA: Diagnosis not present

## 2020-09-15 NOTE — Progress Notes (Signed)
This patient returns to my office for at risk foot care.  This patient requires this care by a professional since this patient will be at risk due to having diabetes.  This patient is unable to cut nails herself since the patient cannot reach her nails.These nails are painful walking and wearing shoes.  This patient presents for at risk foot care today.  General Appearance  Alert, conversant and in no acute stress.  Vascular  Dorsalis pedis and posterior tibial  pulses are palpable  bilaterally.  Capillary return is within normal limits  bilaterally. Temperature is within normal limits  bilaterally.  Neurologic  Senn-Weinstein monofilament wire test within normal limits  bilaterally. Muscle power within normal limits bilaterally.  Nails Thick disfigured discolored nails with subungual debris  from hallux to fifth toes bilaterally. No evidence of bacterial infection or drainage bilaterally.  Orthopedic  No limitations of motion  feet .  No crepitus or effusions noted.  No bony pathology or digital deformities noted.  Skin  normotropic skin with no porokeratosis noted bilaterally.  No signs of infections or ulcers noted.     Onychomycosis  Pain in right toes  Pain in left toes  Consent was obtained for treatment procedures.   Mechanical debridement of nails 1-5  bilaterally performed with a nail nipper.  Filed with dremel without incident. No infection or ulcer.     Return office visit    10 weeks                 Told patient to return for periodic foot care and evaluation due to potential at risk complications.   Jayel Scaduto DPM  

## 2020-09-19 ENCOUNTER — Encounter (INDEPENDENT_AMBULATORY_CARE_PROVIDER_SITE_OTHER): Payer: Self-pay | Admitting: Nurse Practitioner

## 2020-09-19 NOTE — Progress Notes (Signed)
Subjective:    Patient ID: Stacy [name], female    DOB: 1945-04-12, 75 y.o.   MRN: [id number] Chief Complaint  Patient presents with   Follow-up    Pt conv [place]    The patient returns to the office for followup evaluation regarding leg swelling.  The swelling has persisted and the pain associated with swelling continues. There have not been any interval development of a ulcerations or wounds.  Since the previous visit the patient has been wearing graduated compression stockings and has noted  improvement in the lymphedema. The patient has been using compression routinely morning until night.  The patient also states elevation during the day and exercise is being done too.    Today noninvasive studies show no evidence of DVT or superficial venous thrombosis bilaterally.  There is no evidence of deep venous insufficiency or superficial venous reflux in the right lower extremity.  The left lower extremity has deep venous insufficiency as well as reflux in the great saphenous vein at the saphenofemoral junction extending to the knee.   Review of Systems  Cardiovascular:  Positive for leg swelling.  All other systems reviewed and are negative.     Objective:   Physical Exam Vitals reviewed.  HENT:     Head: Normocephalic.  Cardiovascular:     Rate and Rhythm: Normal rate.     Pulses: Normal pulses.  Pulmonary:     Effort: Pulmonary effort is normal.  Musculoskeletal:     Right lower leg: 1+ Edema present.     Left lower leg: 1+ Edema present.  Neurological:     Mental Status: She is alert and oriented to person, place, and time.  Psychiatric:        Mood and Affect: Mood normal.        Behavior: Behavior normal.        Thought Content: Thought content normal.        Judgment: Judgment normal.    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  Tobacco Use   Smoking status: Never   Smokeless tobacco: Never   Tobacco comments:    smoking cessation materials not required  Vaping Use   Vaping Use: Never used  Substance and Sexual Activity   Alcohol use: No    Alcohol/week: 0.0 standard drinks   Drug use: No   Sexual activity: Not Currently    Comment: Husband has prostate issues  Other Topics Concern   Not on file  Social History Narrative   Not on file   Social Determinants of Health   Financial Resource Strain: Low Risk    Difficulty of Paying Living Expenses: Not hard at all  Food Insecurity: No Food Insecurity   Worried About Charity fundraiser in the Last Year: Never true   Ran Out of Food in the Last Year: Never true  Transportation  Needs: No Transportation Needs   Lack of Transportation (Medical): No   Lack of Transportation (Non-Medical): No  Physical Activity: Inactive   Days of Exercise per Week: 0 days   Minutes of Exercise per Session: 0 min  Stress: No Stress Concern Present   Feeling of Stress : Not at all  Social Connections: Socially Integrated   Frequency of Communication with Friends and Family: Twice a week   Frequency of Social Gatherings with Friends and Family: Once a week   Attends Religious Services: More than 4 times per year   Active Member of [employer] or Organizations: Yes   Attends Archivist Meetings: 1 to 4 times per year   Marital Status: Married  Human resources officer Violence: Not At Risk   Fear of Current or Ex-Partner: No   Emotionally Abused: No   Physically Abused: No   Sexually Abused: No    Past Surgical History:  Procedure Laterality Date   BREAST BIOPSY  Right 2015   3 stereo biopsies. 2 benign. one was radial scar, no excision done for radial scar   BREAST BIOPSY Left 07/16/2017   BIPHASIC STROMAL AND EPITHELIAL LESION./ Dr Bary Castilla   BREAST BIOPSY Left 08/23/2020   [place] bx of mass 5:00 2cmfn, coil marker, path pending   BREAST EXCISIONAL BIOPSY Left 1968   x 3 per pt   BREAST EXCISIONAL BIOPSY Right 2007   benign   COLONOSCOPY  2011   DR,ISHAKIS   FOOT SURGERY  2004   HEEL SPUR SURGERY Left 12/15/2014   Dr. Earleen [facility] at [facility]    Family History  Problem Relation Age of Onset   Breast cancer Sister 45   Arthritis Mother    Stroke Paternal Grandmother    Cancer Brother 21       kidney   Cancer Maternal Grandmother        Gallbladder    Allergies  Allergen Reactions   Ramipril Swelling and Other (See Comments)    CBC Latest Ref Rng & Units 07/26/2020 07/22/2019 07/22/2018  WBC 3.8 - 10.8 Thousand/uL 3.4(L) 3.7(L) 4.1  Hemoglobin 11.7 - 15.5 g/dL 12.3 12.9 12.2  Hematocrit 35.0 - 45.0 % 37.6 40.1 36.5  Platelets 140 - 400 Thousand/uL 247 258 258      CMP     Component Value Date/Time   NA 140 07/26/2020 1202   NA 143 04/06/2015 0902   K 4.2 07/26/2020 1202   CL 103 07/26/2020 1202   CO2 30 07/26/2020 1202   GLUCOSE 93 07/26/2020 1202   BUN 15 07/26/2020 1202   BUN 20 04/06/2015 0902   CREATININE 0.81 07/26/2020 1202   CALCIUM 9.5 07/26/2020 1202   PROT 7.2 07/26/2020 1202   PROT 7.2 04/06/2015 0902   ALBUMIN 4.2 10/10/2015 0943   ALBUMIN 4.0 04/06/2015 0902   AST 16 07/26/2020 1202   ALT 15 07/26/2020 1202   ALKPHOS 51 10/10/2015 0943   BILITOT 0.6 07/26/2020 1202   BILITOT 0.3 04/06/2015 0902   GFRNONAA 64 07/22/2019 1110   GFRAA 74 07/22/2019 1110     No results found.     Assessment & Plan:   1. Leg edema No surgery or intervention at this point in time.    I have reviewed my discussion with the patient regarding venous insufficiency and secondary lymph edema and why it  causes symptoms. I  have discussed with the patient the chronic skin changes that accompany these problems and the long term sequela such  as ulceration and infection.  Patient will continue wearing graduated compression stockings class 1 (20-30 mmHg) on a daily basis a prescription was given to the patient to keep this updated. The patient will  put the stockings on first thing in the morning and removing them in the evening. The patient is instructed specifically not to sleep in the stockings.  In addition, behavioral modification including elevation during the day will be continued.  Diet and salt restriction was also discussed.  The patient does have reflux in the left lower extremity great saphenous vein patient will continue conservative therapy as above.  We will assess progress with conservative therapy and follow-up.  2. Hyperlipidemia associated with type 2 diabetes mellitus ([place]) Continue statin as ordered and reviewed, no changes at this time   3. Diabetes mellitus without complication ([place]) Continue hypoglycemic medications as already ordered, these medications have been reviewed and there are no changes at this time.  Hgb A1C to be monitored as already arranged by primary service    Current Outpatient Medications on File Prior to Visit  Medication Sig Dispense Refill   aspirin 81 MG EC tablet Take by mouth.     Cholecalciferol (VITAMIN D-3 PO) Take 1,000 Units by mouth daily.      Cinnamon 500 MG capsule Take 1,000 mg by mouth daily.     FISH OIL-BORAGE-FLAX-SAFFLOWER PO Take 2 tablets by mouth daily.     fluticasone (FLONASE) 50 MCG/ACT nasal spray Place 2 sprays into both nostrils daily. 48 g 1   fluticasone (FLONASE) 50 MCG/ACT nasal spray Place into the nose.     ketoconazole (NIZORAL) 2 % cream Apply 1 application topically daily. (Patient taking differently: Apply 1 application topically daily as needed.) 60 g 0   loratadine (CLARITIN) 10 MG tablet Take 1 tablet (10 mg total) by mouth daily. 90  tablet 1   meloxicam (MOBIC) 7.5 MG tablet Take 7.5 mg by mouth daily.     Multiple Vitamins-Minerals (MULTIVITAMIN GUMMIES ADULT PO) Take by mouth daily.     olopatadine (PATANOL) 0.1 % ophthalmic solution 1 drop 2 (two) times daily.     rosuvastatin (CRESTOR) 5 MG tablet TAKE 1 TABLET BY MOUTH 3 TIMES A WEEK 36 tablet 0   triamterene-hydrochlorothiazide (MAXZIDE-25) 37.5-25 MG tablet Take 1 tablet by mouth daily. 90 tablet 0   No current facility-administered medications on file prior to visit.    There are no Patient Instructions on file for this visit. No follow-ups on file.   Kris Hartmann, NP

## 2020-09-21 ENCOUNTER — Other Ambulatory Visit: Payer: Self-pay | Admitting: Family Medicine

## 2020-09-21 DIAGNOSIS — E1169 Type 2 diabetes mellitus with other specified complication: Secondary | ICD-10-CM

## 2020-09-21 DIAGNOSIS — E785 Hyperlipidemia, unspecified: Secondary | ICD-10-CM

## 2020-09-26 ENCOUNTER — Ambulatory Visit: Payer: TRICARE For Life (TFL) | Admitting: Podiatry

## 2020-09-27 ENCOUNTER — Encounter
Admission: RE | Admit: 2020-09-27 | Discharge: 2020-09-27 | Disposition: A | Discharge status: Home / Self Care | Payer: Medicare PPO | Source: Ambulatory Visit | Attending: General Surgery | Admitting: General Surgery

## 2020-09-27 ENCOUNTER — Other Ambulatory Visit: Payer: Self-pay

## 2020-09-27 HISTORY — DX: Unspecified osteoarthritis, unspecified site: M19.90

## 2020-09-27 NOTE — Patient Instructions (Addendum)
Your procedure is scheduled on: Friday 9/23 Report to Day Surgery. To find out your arrival time please call [phone] between 1PM - 3PM on Thurs 9/22  Remember: Instructions that are not followed completely may result in serious medical risk,  up to and including death, or upon the discretion of your surgeon and anesthesiologist your  surgery may need to be rescheduled.     _X__ 1. Do not eat food after midnight the night before your procedure.                 No chewing gum or hard candies. You may drink clear liquids up to 2 hours                 before you are scheduled to arrive for your surgery- DO not drink clear                 liquids within 2 hours of the start of your surgery.                 Clear Liquids include:  water, apple juice without pulp, clear Gatorade, G2 or                  Gatorade Zero (avoid Red/Purple/Blue), Black Coffee or Tea (Do not add                 anything to coffee or tea).  ___ 3.  No Alcohol for 24 hours before or after surgery.   ___ 4.  Do Not Smoke or use e-cigarettes For 24 Hours Prior to Your Surgery.                 Do not use any chewable tobacco products for at least 6 hours prior to                 Surgery.  ___  5.  Do not use any recreational drugs (marijuana, cocaine, heroin, ecstasy, MDMA or other) For at least one week prior to your surgery.    Combination of these drugs with anesthesia may have life threatening results.  ____  6.  Bring all medications with you on the day of surgery if instructed.   __x__  7.  Notify your doctor if there is any change in your medical condition      (cold, fever, infections).     Do not wear jewelry, make-up, hairpins, clips or nail polish. Do not wear lotions, powders, or perfumes.  Do not shave 48 hours prior to surgery.  Do not bring valuables to the hospital.    [facility] is not responsible for any belongings or valuables.  Contacts, dentures or bridgework may  not be worn into surgery. Leave your suitcase in the car. After surgery it may be brought to your room. For patients admitted to the hospital, discharge time is determined by your treatment team.   Patients discharged the day of surgery will not be allowed to drive home.   Make arrangements for someone to be with you for the first 24 hours of your Same Day Discharge.    Please read over the following fact sheets that you were given:      __x__ Take these medicines the morning of surgery with A SIP OF WATER:    1. Flonase and allergy medication if needed  2.   3.   4.  5.  6.  ____ Fleet Enema (as directed)   __x__ Use CHG  Soap (or wipes) as directed  ____ Use Benzoyl Peroxide Gel as instructed  ____ Use inhalers on the day of surgery  ____ Stop metformin 2 days prior to surgery    ____ Take 1/2 of usual insulin dose the night before surgery. No insulin the morning          of surgery.   ___x_ Stop aspirin on 9/16  __x__ Stop Anti-inflammatories meloxicam (MOBIC) 7.5 MG tablet , ibuprofen or aleve on 9/16   ___x_ Stop supplements until after surgery.  Cinnamon 500 MG capsule, FISH OIL-BORAGE-FLAX-SAFFLOWER PO  ____ Bring C-Pap to the hospital.    If you have any questions regarding your pre-procedure instructions,  Please call Pre-admit Testing at [phone]

## 2020-09-29 ENCOUNTER — Other Ambulatory Visit: Payer: Self-pay

## 2020-09-29 ENCOUNTER — Other Ambulatory Visit
Admission: RE | Admit: 2020-09-29 | Discharge: 2020-09-29 | Disposition: A | Discharge status: Home / Self Care | Payer: Medicare PPO | Source: Ambulatory Visit | Attending: General Surgery | Admitting: General Surgery

## 2020-09-29 DIAGNOSIS — N63 Unspecified lump in unspecified breast: Secondary | ICD-10-CM | POA: Insufficient documentation

## 2020-09-29 DIAGNOSIS — Z01810 Encounter for preprocedural cardiovascular examination: Secondary | ICD-10-CM | POA: Insufficient documentation

## 2020-09-29 DIAGNOSIS — R9431 Abnormal electrocardiogram [ECG] [EKG]: Secondary | ICD-10-CM | POA: Diagnosis not present

## 2020-09-29 DIAGNOSIS — Z01818 Encounter for other preprocedural examination: Secondary | ICD-10-CM | POA: Diagnosis not present

## 2020-09-29 NOTE — Progress Notes (Signed)
  Perioperative Services Pre-Admission/Anesthesia Testing    Date: 09/29/20  Name: Arena Makki MRN:   [id number]  Re: ECG changes  Planned Surgical Procedure(s):    Case: [id number] Date/Time: 10/07/20 0715   Procedure: EXCISION OF BREAST LESION (Left)   Anesthesia type: General   Pre-op diagnosis: left breast mass/phyllodes tumor   Location: [facility] OR ROOM 02 / [place] ORS FOR ANESTHESIA GROUP   Surgeons: Robert Bellow, MD   Clinical Notes:  Patient scheduled for the above procedure on 10/07/2020 with Dr. Hervey Ard, MD.  In preparation for surgery, patient presented to the PAT clinic on 09/29/2020 for labs and ECG.  In review of her ECG, patient was noted to have acute changes compared to her last tracing performed on 12/11/2016.  ECG revealed inferior TWI's in leads II, III, and aVF, anterior TWI's in leads V3-V5, and RBBB.  Of note, patient has seen cardiology in the past.  She underwent noninvasive cardiovascular testing in 2018. Patient has had no further issues or concerns since being seen by Dr. Rockey Situ back in 2018, thus no follow up visits have occurred.  Myocardial perfusion imaging study performed on 12/19/2016 demonstrated a normal left ventricular systolic function; LVEF hyperdynamic at 86%.  Baseline ECG revealed TWI's in leads II, III, V3-V6.  TWI is also noted during stress in leads II, aVF, and V3-V6.  Wall motion was normal. Overall, there was no evidence of significant stress induced myocardial ischemia, arrhythmia, or scar. Study determined to be low risk.   TTE on 12/21/2016 revealed an LVEF of 60-65%. There were no RWMAs observed. Mild MV regurgitation noted. Both of these studies were normal and felt to be low risk.  Patient was placed on a RTC as needed schedule by cardiology.  Right heart function normal. PASP within normal range.   ECG reviewed today with patient's cardiologist Rockey Situ, MD).  MD advised that "if patient not having angina/anginal equivalent  symptoms, then it would be acceptable to proceed with surgery given the fact that the planned procedural course is low risk".  MD also noted that repeat ECG could be performed in the future to ensure that noted ECG changes are not secondary to lead placement. If ECG changes found to be persistent, then at a later date after patient recovers from surgery, she could be seen in the cardiology clinic to determine whether or not additional cardiac work-up would be indicated. I will forward a copy of this note to her PCP Ancil Boozer, MD) for follow up.   Honor Loh, MSN, APRN, FNP-C, CEN [facility]  Peri-operative Services Nurse Practitioner Phone: [phone] 09/29/20 4:26 PM  NOTE: This note has been prepared using Dragon dictation software. Despite my best ability to proofread, there is always the potential that unintentional transcriptional errors may still occur from this process.

## 2020-10-04 ENCOUNTER — Other Ambulatory Visit: Payer: Self-pay | Admitting: General Surgery

## 2020-10-04 DIAGNOSIS — R928 Other abnormal and inconclusive findings on diagnostic imaging of breast: Secondary | ICD-10-CM

## 2020-10-07 ENCOUNTER — Encounter: Payer: Self-pay | Admitting: General Surgery

## 2020-10-07 ENCOUNTER — Ambulatory Visit
Admission: RE | Admit: 2020-10-07 | Discharge: 2020-10-07 | Disposition: A | Discharge status: Home / Self Care | Payer: Medicare PPO | Source: Ambulatory Visit | Attending: General Surgery | Admitting: General Surgery

## 2020-10-07 ENCOUNTER — Ambulatory Visit: Payer: Medicare PPO | Admitting: Anesthesiology

## 2020-10-07 ENCOUNTER — Ambulatory Visit: Payer: Medicare PPO | Admitting: Urgent Care

## 2020-10-07 ENCOUNTER — Ambulatory Visit
Admission: RE | Admit: 2020-10-07 | Discharge: 2020-10-07 | Disposition: A | Discharge status: Home / Self Care | Payer: Medicare PPO | Attending: General Surgery | Admitting: General Surgery

## 2020-10-07 ENCOUNTER — Encounter
Admission: RE | Disposition: A | Discharge status: Home / Self Care | Payer: Self-pay | Source: Home / Self Care | Attending: General Surgery

## 2020-10-07 ENCOUNTER — Other Ambulatory Visit: Payer: Self-pay

## 2020-10-07 DIAGNOSIS — R928 Other abnormal and inconclusive findings on diagnostic imaging of breast: Secondary | ICD-10-CM

## 2020-10-07 DIAGNOSIS — Z7982 Long term (current) use of aspirin: Secondary | ICD-10-CM | POA: Insufficient documentation

## 2020-10-07 DIAGNOSIS — D242 Benign neoplasm of left breast: Secondary | ICD-10-CM | POA: Diagnosis not present

## 2020-10-07 DIAGNOSIS — N63 Unspecified lump in unspecified breast: Secondary | ICD-10-CM | POA: Diagnosis not present

## 2020-10-07 DIAGNOSIS — D4862 Neoplasm of uncertain behavior of left breast: Secondary | ICD-10-CM | POA: Diagnosis not present

## 2020-10-07 DIAGNOSIS — Z888 Allergy status to other drugs, medicaments and biological substances status: Secondary | ICD-10-CM | POA: Insufficient documentation

## 2020-10-07 DIAGNOSIS — Z79899 Other long term (current) drug therapy: Secondary | ICD-10-CM | POA: Insufficient documentation

## 2020-10-07 HISTORY — PX: EXCISION OF BREAST LESION: SHX6676

## 2020-10-07 SURGERY — EXCISION OF BREAST LESION
Anesthesia: General | Site: Breast | Laterality: Left

## 2020-10-07 MED ORDER — STERILE WATER FOR IRRIGATION IR SOLN
Status: DC | PRN
  Administered 2020-10-07: 1000 mL

## 2020-10-07 MED ORDER — GLYCOPYRROLATE 0.2 MG/ML IJ SOLN
INTRAMUSCULAR | Status: DC | PRN
  Administered 2020-10-07: .2 mg via INTRAVENOUS

## 2020-10-07 MED ORDER — ONDANSETRON HCL 4 MG/2ML IJ SOLN
INTRAMUSCULAR | Status: DC | PRN
  Administered 2020-10-07: 4 mg via INTRAVENOUS

## 2020-10-07 MED ORDER — ACETAMINOPHEN 10 MG/ML IV SOLN
INTRAVENOUS | Status: DC | PRN
  Administered 2020-10-07: 1000 mg via INTRAVENOUS

## 2020-10-07 MED ORDER — PROPOFOL 10 MG/ML IV BOLUS
INTRAVENOUS | Status: DC | PRN
  Administered 2020-10-07: 120 mg via INTRAVENOUS

## 2020-10-07 MED ORDER — OXYCODONE HCL 5 MG PO TABS: 5.0000 mg | ORAL_TABLET | Freq: Once | ORAL | Status: DC | PRN

## 2020-10-07 MED ORDER — CHLORHEXIDINE GLUCONATE CLOTH 2 % EX PADS: 6.0000 | MEDICATED_PAD | Freq: Once | CUTANEOUS | Status: DC

## 2020-10-07 MED ORDER — SEVOFLURANE IN SOLN
RESPIRATORY_TRACT | Status: AC
  Filled 2020-10-07: qty 250

## 2020-10-07 MED ORDER — FENTANYL CITRATE (PF) 100 MCG/2ML IJ SOLN
INTRAMUSCULAR | Status: DC | PRN
  Administered 2020-10-07: 50 ug via INTRAVENOUS

## 2020-10-07 MED ORDER — ACETAMINOPHEN 10 MG/ML IV SOLN: 1000.0000 mg | Freq: Once | INTRAVENOUS | Status: DC | PRN

## 2020-10-07 MED ORDER — BUPIVACAINE-EPINEPHRINE (PF) 0.5% -1:200000 IJ SOLN
INTRAMUSCULAR | Status: AC
  Filled 2020-10-07: qty 30

## 2020-10-07 MED ORDER — HYDROCODONE-ACETAMINOPHEN 5-325 MG PO TABS: 1.0000 | ORAL_TABLET | ORAL | 0 refills | Status: DC | PRN

## 2020-10-07 MED ORDER — FENTANYL CITRATE (PF) 100 MCG/2ML IJ SOLN
INTRAMUSCULAR | Status: AC
  Filled 2020-10-07: qty 2

## 2020-10-07 MED ORDER — ACETAMINOPHEN 10 MG/ML IV SOLN
INTRAVENOUS | Status: AC
  Filled 2020-10-07: qty 100

## 2020-10-07 MED ORDER — LIDOCAINE HCL (CARDIAC) PF 100 MG/5ML IV SOSY
PREFILLED_SYRINGE | INTRAVENOUS | Status: DC | PRN
  Administered 2020-10-07: 100 mg via INTRAVENOUS

## 2020-10-07 MED ORDER — EPHEDRINE SULFATE 50 MG/ML IJ SOLN
INTRAMUSCULAR | Status: DC | PRN
  Administered 2020-10-07: 5 mg via INTRAVENOUS

## 2020-10-07 MED ORDER — DEXAMETHASONE SODIUM PHOSPHATE 10 MG/ML IJ SOLN
INTRAMUSCULAR | Status: DC | PRN
  Administered 2020-10-07: 10 mg via INTRAVENOUS

## 2020-10-07 MED ORDER — BUPIVACAINE-EPINEPHRINE (PF) 0.5% -1:200000 IJ SOLN
INTRAMUSCULAR | Status: DC | PRN
  Administered 2020-10-07: 30 mL

## 2020-10-07 MED ORDER — OXYCODONE HCL 5 MG/5ML PO SOLN: 5.0000 mg | Freq: Once | ORAL | Status: DC | PRN

## 2020-10-07 MED ORDER — LACTATED RINGERS IV SOLN: INTRAVENOUS | Status: DC

## 2020-10-07 MED ORDER — FAMOTIDINE 20 MG PO TABS
ORAL_TABLET | ORAL | Status: AC
  Administered 2020-10-07: 20 mg via ORAL
  Filled 2020-10-07: qty 1

## 2020-10-07 MED ORDER — PHENYLEPHRINE HCL (PRESSORS) 10 MG/ML IV SOLN
INTRAVENOUS | Status: DC | PRN
  Administered 2020-10-07 (×6): 100 ug via INTRAVENOUS

## 2020-10-07 MED ORDER — CHLORHEXIDINE GLUCONATE 0.12 % MT SOLN: 15.0000 mL | Freq: Once | OROMUCOSAL | Status: AC

## 2020-10-07 MED ORDER — FENTANYL CITRATE (PF) 100 MCG/2ML IJ SOLN: 25.0000 ug | INTRAMUSCULAR | Status: DC | PRN

## 2020-10-07 MED ORDER — FAMOTIDINE 20 MG PO TABS: 20.0000 mg | ORAL_TABLET | Freq: Once | ORAL | Status: AC

## 2020-10-07 MED ORDER — PROPOFOL 10 MG/ML IV BOLUS
INTRAVENOUS | Status: AC
  Filled 2020-10-07: qty 20

## 2020-10-07 MED ORDER — CHLORHEXIDINE GLUCONATE 0.12 % MT SOLN
OROMUCOSAL | Status: AC
  Administered 2020-10-07: 15 mL via OROMUCOSAL
  Filled 2020-10-07: qty 15

## 2020-10-07 MED ORDER — ORAL CARE MOUTH RINSE: 15.0000 mL | Freq: Once | OROMUCOSAL | Status: AC

## 2020-10-07 MED ORDER — PHENYLEPHRINE HCL (PRESSORS) 10 MG/ML IV SOLN
INTRAVENOUS | Status: AC
  Filled 2020-10-07: qty 1

## 2020-10-07 MED ORDER — PROMETHAZINE HCL 25 MG/ML IJ SOLN
6.2500 mg | INTRAMUSCULAR | Status: DC | PRN
Start: 2020-10-07 — End: 2020-10-07

## 2020-10-07 SURGICAL SUPPLY — 50 items
APL PRP STRL LF DISP 70% ISPRP (MISCELLANEOUS) ×1
BLADE PHOTON ILLUMINATED (MISCELLANEOUS) IMPLANT
BLADE SURG 15 STRL SS SAFETY (BLADE) ×4 IMPLANT
BRA SURGICAL XLRG (MISCELLANEOUS) ×2 IMPLANT
BULB RESERV EVAC DRAIN JP 100C (MISCELLANEOUS) IMPLANT
CHLORAPREP W/TINT 26 (MISCELLANEOUS) ×2 IMPLANT
CNTNR SPEC 2.5X3XGRAD LEK (MISCELLANEOUS) ×1
CONT SPEC 4OZ STER OR WHT (MISCELLANEOUS) ×1
CONT SPEC 4OZ STRL OR WHT (MISCELLANEOUS) ×1
CONTAINER SPEC 2.5X3XGRAD LEK (MISCELLANEOUS) ×1 IMPLANT
COVER PROBE FLX POLY STRL (MISCELLANEOUS) ×2 IMPLANT
DEVICE DUBIN SPECIMEN MAMMOGRA (MISCELLANEOUS) ×2 IMPLANT
DRAIN CHANNEL JP 15F RND 16 (MISCELLANEOUS) IMPLANT
DRAPE LAPAROTOMY TRNSV 106X77 (MISCELLANEOUS) ×2 IMPLANT
DRSG GAUZE FLUFF 36X18 (GAUZE/BANDAGES/DRESSINGS) ×2 IMPLANT
DRSG TELFA 3X8 NADH (GAUZE/BANDAGES/DRESSINGS) ×2 IMPLANT
ELECT CAUTERY BLADE TIP 2.5 (TIP) ×2
ELECT REM PT RETURN 9FT ADLT (ELECTROSURGICAL) ×2
ELECTRODE CAUTERY BLDE TIP 2.5 (TIP) ×1 IMPLANT
ELECTRODE REM PT RTRN 9FT ADLT (ELECTROSURGICAL) ×1 IMPLANT
GAUZE 4X4 16PLY ~~LOC~~+RFID DBL (SPONGE) ×2 IMPLANT
GLOVE SURG ENC MOIS LTX SZ7.5 (GLOVE) ×2 IMPLANT
GLOVE SURG UNDER LTX SZ8 (GLOVE) ×2 IMPLANT
GOWN STRL REUS W/ TWL LRG LVL3 (GOWN DISPOSABLE) ×2 IMPLANT
GOWN STRL REUS W/TWL LRG LVL3 (GOWN DISPOSABLE) ×4
KIT TURNOVER KIT A (KITS) ×2 IMPLANT
LABEL OR SOLS (LABEL) ×2 IMPLANT
MANIFOLD NEPTUNE II (INSTRUMENTS) ×2 IMPLANT
MARGIN MAP 10MM (MISCELLANEOUS) ×2 IMPLANT
NEEDLE HYPO 22GX1.5 SAFETY (NEEDLE) ×2 IMPLANT
NEEDLE HYPO 25X1 1.5 SAFETY (NEEDLE) IMPLANT
PACK BASIN MINOR ARMC (MISCELLANEOUS) ×2 IMPLANT
SHEARS FOC LG CVD HARMONIC 17C (MISCELLANEOUS) IMPLANT
SHEARS HARMONIC 9CM CVD (BLADE) IMPLANT
STRIP CLOSURE SKIN 1/2X4 (GAUZE/BANDAGES/DRESSINGS) ×2 IMPLANT
SUT ETHILON 3-0 FS-10 30 BLK (SUTURE) ×2
SUT SILK 2 0 (SUTURE)
SUT SILK 2-0 18XBRD TIE 12 (SUTURE) IMPLANT
SUT VIC AB 2-0 CT1 27 (SUTURE) ×2
SUT VIC AB 2-0 CT1 TAPERPNT 27 (SUTURE) ×1 IMPLANT
SUT VIC AB 4-0 FS2 27 (SUTURE) IMPLANT
SUT VICRYL+ 3-0 144IN (SUTURE) ×2 IMPLANT
SUTURE EHLN 3-0 FS-10 30 BLK (SUTURE) ×1 IMPLANT
SWABSTK COMLB BENZOIN TINCTURE (MISCELLANEOUS) ×2 IMPLANT
SYR 10ML LL (SYRINGE) ×2 IMPLANT
SYR BULB IRRIG 60ML STRL (SYRINGE) ×2 IMPLANT
SYR CONTROL 10ML LL (SYRINGE) ×2 IMPLANT
TAPE TRANSPORE STRL 2 31045 (GAUZE/BANDAGES/DRESSINGS) IMPLANT
WATER STERILE IRR 1000ML POUR (IV SOLUTION) ×2 IMPLANT
WATER STERILE IRR 500ML POUR (IV SOLUTION) IMPLANT

## 2020-10-07 NOTE — H&P (Signed)
Stacy [name] [id number] Dec 02, 1945     HPI:  75 y/o with complex fibroadenoma of the left breast. For excision.   Medications Prior to Admission  Medication Sig Dispense Refill Last Dose   aspirin 81 MG EC tablet Take 81 mg by mouth daily.   09/30/2020   Cholecalciferol (VITAMIN D-3) 25 MCG (1000 UT) CAPS Take 1,000 Units by mouth daily.    09/30/2020   Cinnamon 500 MG capsule Take 1,000 mg by mouth daily.   09/30/2020   FISH OIL-BORAGE-FLAX-SAFFLOWER PO Take 2 capsules by mouth daily.   09/30/2020   fluticasone (FLONASE) 50 MCG/ACT nasal spray Place 2 sprays into both nostrils daily. (Patient taking differently: Place 2 sprays into both nostrils daily as needed for allergies.) 48 g 1 Past Month   loratadine (CLARITIN) 10 MG tablet Take 1 tablet (10 mg total) by mouth daily. (Patient taking differently: Take 10 mg by mouth daily as needed for allergies.) 90 tablet 1 Past Month   meloxicam (MOBIC) 7.5 MG tablet Take 7.5 mg by mouth daily as needed for pain.   09/30/2020   olopatadine (PATANOL) 0.1 % ophthalmic solution Place 1 drop into both eyes 2 (two) times daily as needed for allergies.   09/30/2020   triamterene-hydrochlorothiazide (MAXZIDE-25) 37.5-25 MG tablet Take 1 tablet by mouth daily. 90 tablet 0 09/30/2020   ketoconazole (NIZORAL) 2 % cream Apply 1 application topically daily. 60 g 0 Not Taking   rosuvastatin (CRESTOR) 5 MG tablet TAKE 1 TABLET BY MOUTH 3 TIMES A WEEK 36 tablet 0 09/30/2020   Allergies  Allergen Reactions   Ramipril Swelling and Other (See Comments)    lips   Past Medical History:  Diagnosis Date   Allergy    Arthritis    Hyperlipidemia    Hypertension    Neoplasm of uncertain behavior of breast 2007   Past Surgical History:  Procedure Laterality Date   BREAST BIOPSY Right 2015   3 stereo biopsies. 2 benign. one was radial scar, no excision done for radial scar   BREAST BIOPSY Left 07/16/2017   BIPHASIC STROMAL AND EPITHELIAL LESION./ Dr Bary Castilla    BREAST BIOPSY Left 08/23/2020   [place] bx of mass 5:00 2cmfn, coil marker, path pending   BREAST EXCISIONAL BIOPSY Left 1968   x 3 per pt   BREAST EXCISIONAL BIOPSY Right 2007   benign   COLONOSCOPY  2011   DR,ISHAKIS   FOOT SURGERY Bilateral 2004   HEEL SPUR SURGERY Left 12/15/2014   Dr. Earleen Newport at [facility] History   Marital status: Married    Spouse name: Richard   Number of children: 1   Years of education: Not on file   Highest education level: 12th grade  Occupational History   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  Tobacco Use   Smoking status: Never   Smokeless tobacco: Never   Tobacco comments:    smoking cessation materials not required  Vaping Use   Vaping Use: Never used  Substance and Sexual Activity   Alcohol use: No    Alcohol/week: 0.0 standard drinks   Drug use: No   Sexual activity: Not Currently    Comment: Husband has prostate issues  Other Topics Concern   Not on file  Social History Narrative   Not on file   Social Determinants of Health   Financial Resource Strain: Low Risk    Difficulty of Paying  Living Expenses: Not hard at all  Food Insecurity: No Food Insecurity   Worried About [place] in the Last Year: Never true   Ran Out of Food in the Last Year: Never true  Transportation Needs: No Transportation Needs   Lack of Transportation (Medical): No   Lack of Transportation (Non-Medical): No  Physical Activity: Inactive   Days of Exercise per Week: 0 days   Minutes of Exercise per Session: 0 min  Stress: No Stress Concern Present   Feeling of Stress : Not at all  Social Connections: Socially Integrated   Frequency of Communication with Friends and Family: Twice a week   Frequency of Social Gatherings with Friends and Family: Once a week   Attends Religious Services: More than 4 times per year   Active Member of [employer] or Organizations: Yes   Attends Archivist Meetings: 1  to 4 times per year   Marital Status: Married  Human resources officer Violence: Not At Risk   Fear of Current or Ex-Partner: No   Emotionally Abused: No   Physically Abused: No   Sexually Abused: No   Social History   Social History Narrative   Not on file     ROS: Negative.     PE: HEENT: Negative. Lungs: Clear. Cardio: RR.   Assessment/Plan:  Proceed with planned left breast biopsy.  Stacy [name] [facility] 10/07/2020

## 2020-10-07 NOTE — Anesthesia Preprocedure Evaluation (Addendum)
Anesthesia Evaluation  Patient identified by MRN, date of birth, ID band Patient awake    Reviewed: Allergy & Precautions, NPO status , Patient's Chart, lab work & pertinent test results  Airway Mallampati: III  TM Distance: >3 FB Neck ROM: full    Dental  (+) Missing,    Pulmonary neg pulmonary ROS,    Pulmonary exam normal        Cardiovascular Exercise Tolerance: Good hypertension, negative cardio ROS Normal cardiovascular exam Rhythm:Regular Rate:Normal     Neuro/Psych negative neurological ROS  negative psych ROS   GI/Hepatic negative GI ROS, Neg liver ROS,   Endo/Other  negative endocrine ROS  Renal/GU      Musculoskeletal  (+) Arthritis ,   Abdominal (+) + obese,   Peds  Hematology negative hematology ROS (+)   Anesthesia Other Findings Past Medical History: No date: Allergy No date: Arthritis No date: Hyperlipidemia No date: Hypertension 2007: Neoplasm of uncertain behavior of breast  Past Surgical History: 2015: BREAST BIOPSY; Right     Comment:  3 stereo biopsies. 2 benign. one was radial scar, no               excision done for radial scar 07/16/2017: BREAST BIOPSY; Left     Comment:  BIPHASIC STROMAL AND EPITHELIAL LESION./ Dr Bary Castilla 08/23/2020: BREAST BIOPSY; Left     Comment:  [place] bx of mass 5:00 2cmfn, coil marker, path pending 1968: BREAST EXCISIONAL BIOPSY; Left     Comment:  x 3 per pt 2007: BREAST EXCISIONAL BIOPSY; Right     Comment:  benign 2011: COLONOSCOPY     Comment:  DR,ISHAKIS 2004: FOOT SURGERY; Bilateral 12/15/2014: HEEL SPUR SURGERY; Left     Comment:  Dr. Earleen Newport at [facility]  BMI    Body Mass Index: 31.31 kg/m      Reproductive/Obstetrics negative OB ROS                            Anesthesia Physical Anesthesia Plan  ASA: 2  Anesthesia Plan: General LMA   Post-op Pain Management:    Induction:   PONV Risk Score and Plan:  Dexamethasone, Ondansetron and Treatment may vary due to age or medical condition  Airway Management Planned:   Additional Equipment:   Intra-op Plan:   Post-operative Plan: Extubation in OR  Informed Consent:     Dental Advisory Given  Plan Discussed with: Anesthesiologist, CRNA and Surgeon  Anesthesia Plan Comments:         Anesthesia Quick Evaluation

## 2020-10-07 NOTE — Anesthesia Postprocedure Evaluation (Signed)
Anesthesia Post Note  Patient: Stacy [name]  Procedure(s) Performed: EXCISION OF BREAST LESION (Left: Breast)  Patient location during evaluation: PACU Anesthesia Type: General Level of consciousness: awake and alert Pain management: pain level controlled Vital Signs Assessment: post-procedure vital signs reviewed and stable Respiratory status: spontaneous breathing, nonlabored ventilation and respiratory function stable Cardiovascular status: blood pressure returned to baseline and stable Postop Assessment: no apparent nausea or vomiting Anesthetic complications: no   No notable events documented.   Last Vitals:  Vitals:   10/07/20 0900 10/07/20 0914  BP: (!) 161/88 (!) 157/90  Pulse: 84 79  Resp: 17 16  Temp: (!) 36.2 C 36.6 C  SpO2: 98% 98%    Last Pain:  Vitals:   10/07/20 0914  TempSrc: Temporal  PainSc: 0-No pain                 [place] Ouch

## 2020-10-07 NOTE — Op Note (Signed)
Preoperative diagnosis: Complex fibroadenoma of the left breast.  Postoperative diagnosis: Same.  Operative procedure: Excision left breast mass.  Operating Surgeon: Hervey Ard, MD.  Anesthesia: Complex General by LMA, Marcaine 0.5% with 1: 200,000 units of epinephrine, 30 cc.  Estimated blood loss: Less than 2 cc.  Clinical note: This 75 year old woman had a biopsy for a breast mass showing evidence of a suspected complex fibroadenoma.  She had a similar biopsy to 3 years ago.  She was admitted for excision.  Operative note: With the patient under adequate general anesthesia the breast was prepped with ChloraPrep and draped.  Ultrasound was used to confirm location of the mass in the 5-6 o'clock position of the left breast about 2 cm below the edge of the areola.  Image obtained for permanent record.  The area was infiltrated with local anesthesia staying away from the infra areolar tissue.  Local was injected below the lesion with ultrasound guidance.  A circumareolar incision from the 4 to 8 o'clock position was made.  The skin was incised sharply and remaining dissection completed with electrocautery.  The area was very close to the surface in the initial section of tissue which was 1 x 1 x 3 cm was removed orientated and specimen radiograph showed no clip.  Repeat ultrasound showed the lesion had been pushed inferiorly and medially.  This was identified excised with only fat around the lesion and specimen radiograph confirmed the previously placed coil clip by the radiology service.  The clip placed in 2019 was not included in the specimen.  With no elderly or palpable residual tissue was elected to terminate the procedure.  Hemostasis was electrocautery.  The deep tissue was approximated with interrupted 2-0 Vicryl sutures.  The superficial adipose layer was approximated a running 2-0 Vicryl suture.  The skin was closed with a running 4-0 Vicryl subcuticular suture.  Benzoin, Steri-Strips,  Telfa dressing followed by fluff gauze and a compressive wrap were applied.  The patient tolerated the procedure well and was taken to the recovery room in stable condition.

## 2020-10-07 NOTE — Anesthesia Procedure Notes (Signed)
Procedure Name: LMA Insertion Date/Time: 10/07/2020 7:55 AM Performed by: Lily Peer, Elif Yonts, CRNA Pre-anesthesia Checklist: Patient identified, Emergency Drugs available, Suction available and Patient being monitored Patient Re-evaluated:Patient Re-evaluated prior to induction Oxygen Delivery Method: Circle system utilized Preoxygenation: Pre-oxygenation with 100% oxygen Induction Type: IV induction Ventilation: Mask ventilation without difficulty LMA: LMA inserted LMA Size: 4.0 Number of attempts: 1 Placement Confirmation: positive ETCO2 and breath sounds checked- equal and bilateral Tube secured with: Tape Dental Injury: Teeth and Oropharynx as per pre-operative assessment

## 2020-10-07 NOTE — Discharge Instructions (Addendum)
Keep the surgical bra applied at the time of your procedure on for four days. You may wash under your arms and use deodorant as needed.   After your shower, you should wear a bra day and night for comfort and support.     Tylenol: If needed for soreness.  You may make use of Mobic or Advil/Aleve if needed.   Norco (hydrocodone): If needed for pain.  This medication may constipate.   Laxative of choice if needed.   No driving until pain-free.   Avoid repetitive activities with the left shoulder for the next week such as mopping, vacuuming or raking.   AMBULATORY SURGERY  DISCHARGE INSTRUCTIONS   The drugs that you were given will stay in your system until tomorrow so for the next 24 hours you should not:  Drive an automobile Make any legal decisions Drink any alcoholic beverage   You may resume regular meals tomorrow.  Today it is better to start with liquids and gradually work up to solid foods.  You may eat anything you prefer, but it is better to start with liquids, then soup and crackers, and gradually work up to solid foods.   Please notify your doctor immediately if you have any unusual bleeding, trouble breathing, redness and pain at the surgery site, drainage, fever, or pain not relieved by medication.    Additional Instructions:        Please contact your physician with any problems or Same Day Surgery at [phone], Monday through Friday 6 am to 4 pm, or [place] at [facility] number at [phone].

## 2020-10-07 NOTE — Transfer of Care (Signed)
Immediate Anesthesia Transfer of Care Note  Patient: Stacy [name]  Procedure(s) Performed: EXCISION OF BREAST LESION (Left: Breast)  Patient Location: PACU  Anesthesia Type:General  Level of Consciousness: drowsy  Airway & Oxygen Therapy: Patient Spontanous Breathing and Patient connected to face mask oxygen  Post-op Assessment: Report given to RN and Post -op Vital signs reviewed and stable  Post vital signs: Reviewed and stable  Last Vitals:  Vitals Value Taken Time  BP 141/69 10/07/20 0830  Temp    Pulse 79 10/07/20 0830  Resp 13 10/07/20 0830  SpO2 100 % 10/07/20 0830  Vitals shown include unvalidated device data.  Last Pain:  Vitals:   10/07/20 0630  TempSrc: Oral  PainSc: 0-No pain         Complications: No notable events documented.

## 2020-10-10 LAB — SURGICAL PATHOLOGY

## 2020-10-11 ENCOUNTER — Encounter: Payer: Self-pay | Admitting: General Surgery

## 2020-10-18 ENCOUNTER — Ambulatory Visit: Payer: Medicare PPO

## 2020-11-01 ENCOUNTER — Telehealth: Payer: Self-pay

## 2020-11-01 NOTE — Progress Notes (Signed)
Chronic Care Management Pharmacy Assistant   Name: Anyiah Coverdale  MRN: [id number] DOB: 23-Sep-1945  Reason for Encounter: Hypertension Disease State Call  Recent office visits:  None ID  Recent consult visits:  09/15/2020 Gardiner Barefoot, DPM (Podiatry) for Nail Problem- No medication changes noted, no orders placed, patient instructed to follow-up in 10 weeks.   09/08/2020 Eulogio Ditch, NP (Vein and Vascular Surgery) for Follow-up- No medication changes noted, no orders placed, no follow-up noted  09/01/2020 Mayer Masker, MD (General Surgery) No medication changes noted, no lab order placed; no follow-up noted  Hospital visits:  Medication Reconciliation was completed by comparing discharge summary, patient's EMR and Pharmacy list, and upon discussion with patient.  Admitted to the hospital on 10/07/2020 due to Excision of Breast Lesion. Discharge date was 10/07/2020. Discharged from [facility] [place]?Medications Started at [facility] Discharge:?? -START taking: HYDROcodone-acetaminophen 5-325 MG tablet Take 1 tablet by mouth every 4 (four) hours as needed for moderate pain.   Medication Changes at Hospital Discharge: -Changed None ID  Medications Discontinued at Hospital Discharge: -Stopped None ID  Medications that remain the same after Hospital Discharge:??  -All other medications will remain the same.    Medications: Outpatient Encounter Medications as of 11/01/2020  Medication Sig   aspirin 81 MG EC tablet Take 81 mg by mouth daily.   Cholecalciferol (VITAMIN D-3) 25 MCG (1000 UT) CAPS Take 1,000 Units by mouth daily.    Cinnamon 500 MG capsule Take 1,000 mg by mouth daily.   FISH OIL-BORAGE-FLAX-SAFFLOWER PO Take 2 capsules by mouth daily.   fluticasone (FLONASE) 50 MCG/ACT nasal spray Place 2 sprays into both nostrils daily. (Patient taking differently: Place 2 sprays into both nostrils daily as needed for allergies.)    HYDROcodone-acetaminophen (NORCO/VICODIN) 5-325 MG tablet Take 1 tablet by mouth every 4 (four) hours as needed for moderate pain.   ketoconazole (NIZORAL) 2 % cream Apply 1 application topically daily.   loratadine (CLARITIN) 10 MG tablet Take 1 tablet (10 mg total) by mouth daily. (Patient taking differently: Take 10 mg by mouth daily as needed for allergies.)   meloxicam (MOBIC) 7.5 MG tablet Take 7.5 mg by mouth daily as needed for pain.   olopatadine (PATANOL) 0.1 % ophthalmic solution Place 1 drop into both eyes 2 (two) times daily as needed for allergies.   rosuvastatin (CRESTOR) 5 MG tablet TAKE 1 TABLET BY MOUTH 3 TIMES A WEEK   triamterene-hydrochlorothiazide (MAXZIDE-25) 37.5-25 MG tablet Take 1 tablet by mouth daily.   No facility-administered encounter medications on file as of 11/01/2020.   Care Gaps: Zoster Vaccines  Tetanus/TDAP (Last completed 07/16/2006) COVID-19 Vaccine Booster 3 Influenza Vaccine (Last completed 01/22/2020)  Star Rating Drugs: Rosuvastatin 5 mg last filled on 09/21/2020 for an 84-Day supply with [facility]  Reviewed chart prior to disease state call. Spoke with patient regarding BP  Recent Office Vitals: BP Readings from Last 3 Encounters:  10/07/20 (!) 157/90  09/08/20 (!) 166/80  08/15/20 (!) 173/79   Pulse Readings from Last 3 Encounters:  10/07/20 79  09/08/20 71  08/15/20 84    Wt Readings from Last 3 Encounters:  10/07/20 194 lb (88 kg)  09/27/20 197 lb 1.5 oz (89.4 kg)  09/08/20 197 lb (89.4 kg)     Kidney Function Lab Results  Component Value Date/Time   CREATININE 0.81 07/26/2020 12:02 PM   CREATININE 0.90 07/22/2019 11:10 AM   GFRNONAA 64 07/22/2019 11:10 AM  GFRAA 74 07/22/2019 11:10 AM    BMP Latest Ref Rng & Units 07/26/2020 08/03/2019 07/22/2019  Glucose 65 - 99 mg/dL 93 - 101(H)  BUN 7 - 25 mg/dL 15 - 13  Creatinine 0.60 - 1.00 mg/dL 0.81 - 0.90  BUN/Creat Ratio 6 - 22 (calc) NOT APPLICABLE - NOT APPLICABLE   Sodium 372 - 146 mmol/L 140 - 141  Potassium 3.5 - 5.3 mmol/L 4.2 4.9 5.7(H)  Chloride 98 - 110 mmol/L 103 - 103  CO2 20 - 32 mmol/L 30 - 34(H)  Calcium 8.6 - 10.4 mg/dL 9.5 - 10.3    Current antihypertensive regimen:  None ID  How often are you checking your Blood Pressure?  Patient does not check her blood pressure at home  Current home BP readings: Patient does not have a current reading. Patient stated the last time her blood pressure was checked was when she had surgery on her breast a few weeks ago. Patient reports at that time her blood pressure was good.  What recent interventions/DTPs have been made by any provider to improve Blood Pressure control since last CPP Visit: None ID  Any recent hospitalizations or ED visits since last visit with CPP? Yes  What diet changes have been made to improve Blood Pressure Control?  Patient is not on a certain diet. Patient reports that she eats a normal diet and her appetite is good  What exercise is being done to improve your Blood Pressure Control?  Patient doesn't have an exercise regiment, but reports she is able to mover around with no issues  Adherence Review: Is the patient currently on ACE/ARB medication? No Does the patient have >5 day gap between last estimated fill dates? No  Patient reports that she is doing well. Patient denies any ill symptoms today, and she denies needing any refills on her medications. Patient reports that she had surgery on her breast a few weeks ago, and reports that she is healing well with no problems. Patient denies any pain at this time.   Patient has scheduled telephone appointment with Junius Argyle, CPP on 12/28/2020 @ 0800  [name], [place] Pharmacist Assistant Phone: [phone]

## 2020-11-03 ENCOUNTER — Encounter: Payer: Self-pay | Admitting: General Surgery

## 2020-11-15 ENCOUNTER — Ambulatory Visit: Payer: Medicare PPO

## 2020-11-16 LAB — HM DIABETES FOOT EXAM: HM Diabetic Foot Exam: ABNORMAL

## 2020-11-17 ENCOUNTER — Other Ambulatory Visit: Payer: Self-pay

## 2020-11-17 ENCOUNTER — Ambulatory Visit (INDEPENDENT_AMBULATORY_CARE_PROVIDER_SITE_OTHER): Payer: Medicare PPO | Admitting: Podiatry

## 2020-11-17 ENCOUNTER — Encounter: Payer: Self-pay | Admitting: Podiatry

## 2020-11-17 DIAGNOSIS — M79675 Pain in left toe(s): Secondary | ICD-10-CM

## 2020-11-17 DIAGNOSIS — B351 Tinea unguium: Secondary | ICD-10-CM

## 2020-11-17 DIAGNOSIS — E119 Type 2 diabetes mellitus without complications: Secondary | ICD-10-CM | POA: Diagnosis not present

## 2020-11-17 DIAGNOSIS — M79674 Pain in right toe(s): Secondary | ICD-10-CM

## 2020-11-17 NOTE — Progress Notes (Signed)
This patient returns to my office for at risk foot care.  This patient requires this care by a professional since this patient will be at risk due to having diabetes.  This patient is unable to cut nails herself since the patient cannot reach her nails.These nails are painful walking and wearing shoes.  This patient presents for at risk foot care today.  General Appearance  Alert, conversant and in no acute stress.  Vascular  Dorsalis pedis and posterior tibial  pulses are palpable  bilaterally.  Capillary return is within normal limits  bilaterally. Temperature is within normal limits  bilaterally.  Neurologic  Senn-Weinstein monofilament wire test within normal limits  bilaterally. Muscle power within normal limits bilaterally.  Nails Thick disfigured discolored nails with subungual debris  from hallux to fifth toes bilaterally. No evidence of bacterial infection or drainage bilaterally.  Orthopedic  No limitations of motion  feet .  No crepitus or effusions noted.  No bony pathology or digital deformities noted.  Skin  normotropic skin with no porokeratosis noted bilaterally.  No signs of infections or ulcers noted.     Onychomycosis  Pain in right toes  Pain in left toes  Consent was obtained for treatment procedures.   Mechanical debridement of nails 1-5  bilaterally performed with a nail nipper.  Filed with dremel without incident. No infection or ulcer.     Return office visit    10 weeks                 Told patient to return for periodic foot care and evaluation due to potential at risk complications.   Kelsei Defino DPM  

## 2020-11-22 ENCOUNTER — Ambulatory Visit (INDEPENDENT_AMBULATORY_CARE_PROVIDER_SITE_OTHER): Payer: Medicare PPO

## 2020-11-22 DIAGNOSIS — Z0000 Encounter for general adult medical examination without abnormal findings: Secondary | ICD-10-CM

## 2020-11-22 NOTE — Progress Notes (Signed)
Erroneous entry- pt to reschedule wellness visit

## 2020-11-28 NOTE — Progress Notes (Deleted)
Name: Stacy [name]   MRN: [id number]    DOB: 04-21-1945   Date:11/28/2020       Progress Note  Subjective  Chief Complaint  Follow Up  HPI  DMII: doing well, on diet only, can't take ACE caused angioedema, we will recheck urine micro and if positive discussed trying Valsartan and see if she can tolerate it . Denies polyphagia, polydipsia or polyuria.  She is following a diabetic diet. She has been compliant with Crestor 5 mg M, W and Fridays , last LDL was at goal.and we will recheck it today  A1C today down from 6 % to 5.8 % eye exam it up to date . She has onychomycosis and gets nail trimmed by podiatrist and stable.    HTN: taking diuretic, tolerating it well. Denies chest pain, dizziness, palpitation, orthopnea  or SOB, lower extremity swelling , she states better with compression stocking hoses but getting worried about it, discussed referral to vascular surgeon and she is willing to go. She states difficulty getting shoes to wear for church and it has been bothering her more lately  She is taking Triamterene hctz 37/25 daily.    Obesity: she has DM and HTN, discussed life style modification. Weight is stable , she has been trying to follow a diabetic diet, she has been walking around the house    Vitamin D deficiency: continue supplementation , reviewed last labs and at goal, continue supplementation    AR: she only taking medication prn, currently no rhinorrhea, nasal congestion or rhinorrhea, but has eye pruritis but better with eye drops given by ophthalmologist. She does not recall the name of the eye drops    Patient Active Problem List   Diagnosis Date Noted   Hyperlipidemia associated with type 2 diabetes mellitus ([place]) 01/14/2020   Onychomycosis of multiple toenails with type 2 diabetes mellitus (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 2 benign. one was radial scar, no excision done for radial scar   BREAST BIOPSY Left 07/16/2017   BIPHASIC STROMAL AND EPITHELIAL LESION./ Dr Bary Castilla   BREAST BIOPSY Left 08/23/2020   [place] bx of mass 5:00 2cmfn, coil marker, path pending   BREAST EXCISIONAL BIOPSY Left 1968   x 3 per pt   BREAST EXCISIONAL BIOPSY Right 2007   benign   COLONOSCOPY  2011   DR,ISHAKIS   EXCISION OF BREAST LESION Left 10/07/2020   Procedure: EXCISION OF BREAST LESION;  Surgeon: Robert Bellow, MD;  Location: [facility] ORS;  Service: General;  Laterality: Left;   FOOT SURGERY Bilateral 2004   HEEL SPUR SURGERY Left 12/15/2014   Dr[facility] Earleen [facility]    Family History  Problem Relation Age of Onset   Breast cancer Sister 67   Arthritis Mother    Stroke Paternal Grandmother    Cancer  Brother 8       kidney   Cancer Maternal Grandmother        Gallbladder    Social History   Tobacco Use   Smoking status: Never   Smokeless tobacco: Never   Tobacco comments:    smoking cessation materials not required  Substance Use Topics   Alcohol use: No    Alcohol/week: 0.0 standard drinks     Current Outpatient Medications:    aspirin 81 MG EC tablet, Take 81 mg by mouth daily., Disp: , Rfl:    Cholecalciferol (VITAMIN D-3) 25 MCG (1000 UT) CAPS, Take 1,000 Units by mouth daily. , Disp: , Rfl:    Cinnamon 500 MG capsule, Take 1,000 mg by mouth daily., Disp: , Rfl:    FISH  OIL-BORAGE-FLAX-SAFFLOWER PO, Take 2 capsules by mouth daily., Disp: , Rfl:    fluticasone (FLONASE) 50 MCG/ACT nasal spray, Place 2 sprays into both nostrils daily. (Patient taking differently: Place 2 sprays into both nostrils daily as needed for allergies.), Disp: 48 g, Rfl: 1   HYDROcodone-acetaminophen (NORCO/VICODIN) 5-325 MG tablet, Take 1 tablet by mouth every 4 (four) hours as needed for moderate pain., Disp: 10 tablet, Rfl: 0   ketoconazole (NIZORAL) 2 % cream, Apply 1 application topically daily., Disp: 60 g, Rfl: 0   loratadine (CLARITIN) 10 MG tablet, Take 1 tablet (10 mg total) by mouth daily. (Patient taking differently: Take 10 mg by mouth daily as needed for allergies.), Disp: 90 tablet, Rfl: 1   meloxicam (MOBIC) 7.5 MG tablet, Take 7.5 mg by mouth daily as needed for pain., Disp: , Rfl:    olopatadine (PATANOL) 0.1 % ophthalmic solution, Place 1 drop into both eyes 2 (two) times daily as needed for allergies., Disp: , Rfl:    rosuvastatin (CRESTOR) 5 MG tablet, TAKE 1 TABLET BY MOUTH 3 TIMES A WEEK, Disp: 36 tablet, Rfl: 0   triamterene-hydrochlorothiazide (MAXZIDE-25) 37.5-25 MG tablet, Take 1 tablet by mouth daily., Disp: 90 tablet, Rfl: 0  Allergies  Allergen Reactions   Ramipril Swelling and Other (See Comments)    lips    I personally reviewed active problem list, medication list, allergies, family history, social history, health maintenance with the patient/caregiver today.   ROS  ***  Objective  There were no vitals filed for this visit.  There is no height or weight on file to calculate BMI.  Physical Exam ***  Recent Results (from the past 2160 hour(s))  Surgical pathology     Status: None   Collection Time: 10/07/20  8:19 AM  Result Value Ref Range   SURGICAL PATHOLOGY      SURGICAL PATHOLOGY CASE: [id number] PATIENT: Stacy [name] Surgical Pathology Report     Specimen Submitted: A. Breast, left  Clinical History: Left breast  mass/phyllodes tumor    DIAGNOSIS: A. BREAST, LEFT; EXCISION: - BENIGN PHYLLODES TUMOR, 1.1 CM, PRESENT AT INKED AND CAUTERIZED EDGE OF UNORIENTED TISSUE FRAGMENT. - CLIP AND BIOPSY SITE IDENTIFIED IN UNORIENTED TISSUE FRAGMENT. - ORIENTED TISSUE FRAGMENT SHOWING BENIGN MAMMARY PARENCHYMA WITH FIBROCYSTIC AND APOCRINE CHANGES. - NEGATIVE FOR MALIGNANCY.  Comment: Benign phyllodes tumor is present at the cauterized edge of the unoriented tissue fragment containing the coil-shaped biopsy clip and biopsy site changes. It is unclear from histologic evaluation if this represents true margin, or abuts the additional, oriented tissue. Clinical correlation is recommended.  GROSS DESCRIPTION: A. Labeled: Left breast mass; marker on specimen does not contain clip, unmarked specimen contains clip Received: Fre sh  Specimen radiograph image(s) available for review Radiographic findings: There is a radiograph of the unoriented fragment with a grid demonstrating the presence of a clip.  The specimen is received within a sterile container and not within the container that has the grid. Time in fixative: Collected at 8:19 AM on 10/07/2020 and placed in formalin at 9:25 AM on 10/07/2020 Cold ischemic time: Over 1 hour Total fixation time: Approximately 10.5 hours Type of procedure: Excision of breast lesion Location / laterality of specimen: Left breast Orientation of specimen: 1 fragment is received unoriented and is entirely inked blue.  1 fragment is received oriented with sutured metallic clips labeled cranial, medial, and lateral.  Both fragments are inked at the time of grossing. Inking: Anterior = green Inferior = blue Lateral = orange Medial = yellow Posterior = black Superior = red Size of specimen: Unoriented fragment: 3.7 x 2.5 x 1.2 cm; oriented fragment: 3.2 (medi al to lateral) x 2.7 (superior to inferior) x 1.4 (anterior to posterior) cm Skin: No distinct skin is grossly  identified on either fragment.  Biopsy site: The unoriented fragment has a single biopsy site which contains a coil shaped clip. Number of discrete masses: 1 Size of mass(es): 1.1 x 0.7 x 0.6 cm Description of mass(es): The mass is well-circumscribed with a gelatinous cystlike area and peripheral areas of tan soft tissue. Distance between masses/clips: The clip is located within the mass. Margins: Less than 0.1 cm Description of remainder of tissue: 1.4 cm from the mass there is a well-circumscribed are of firm fibrous tissue, 0.4 x 0.3 x 0.3 cm.  This area is less than 0.1 cm to the closest inked margin.  The remainder of both fragments are comprised of yellow lobulated adipose tissue admixed with tan focally firm fibrous tissue.  The fat to fibrous tissue ratio is 90:10.  Block summary (the mass is submitted entirely and approximately 20% of the unoriente d fragment remains.  The oriented fragment is submitted entirely as it could not grossly be determined how this fragment abuts the unoriented fragment.): 1 - 6 - unoriented fragment      1 - resection margin A with mass, perpendicularly sectioned      2 - 3 - remaining mass with closest approach to inked peripheral margin           2 - clip site      4 - representative grossly normal breast tissue between mass and additional area of firm fibrous tissue      5 - area of firm fibrous tissue with closest approach to inked margin, submitted entirely      6 - resection margin B, perpendicularly sectioned 7 - 15 - oriented fragment, submitted entirely      7 - lateral margin, perpendicularly sectioned      8 - 14 - central sections      15 - medial margin, perpendicularly sectioned  RB 10/07/2020   Final Diagnosis performed by Allena Napoleon, MD.   Electronically signed 10/10/2020 10:51:07AM The electronic signature indicates that the named Attending Pathologist has evalua ted the specimen Technical component performed at [facility],  [address] [place], [place] [address] Lab: [phone] Dir: Rush Farmer, MD, MMM  Professional component performed at [facility], [facility], [place], [place], [place] [address] Lab: [phone] Dir: Dellia Nims. Reuel Derby, MD     Diabetic Foot Exam: Diabetic Foot Exam - Simple   No data filed    ***  PHQ2/9: Depression screen  [facility] 2/9 07/26/2020 04/27/2020 01/22/2020 10/15/2019 07/22/2019  Decreased Interest 0 0 0 0 0  Down, Depressed, Hopeless 0 0 0 0 0  PHQ - 2 Score 0 0 0 0 0  Altered sleeping - - - - 0  Tired, decreased energy - - - - 0  Change in appetite - - - - 0  Feeling bad or failure about yourself  - - - - 0  Trouble concentrating - - - - 0  Moving slowly or fidgety/restless - - - - 0  Suicidal thoughts - - - - 0  PHQ-9 Score - - - - 0  Difficult doing work/chores - - - - -  Some recent data might be hidden    phq 9 is {gen pos [id number]}   Fall Risk: Fall Risk  07/26/2020 04/27/2020 01/22/2020 10/15/2019 08/11/2019  Falls in the past year? 0 0 0 0 0  Number falls in past yr: 0 0 0 0 -  Injury with Fall? 0 0 0 0 -  Risk for fall due to : - - - No Fall Risks -  Risk for fall due to: Comment - - - - -  Follow up - - Falls evaluation completed Falls prevention discussed -      Functional Status Survey:      Assessment & Plan  *** There are no diagnoses linked to this encounter.

## 2020-11-29 ENCOUNTER — Ambulatory Visit: Payer: Medicare PPO | Admitting: Family Medicine

## 2020-12-22 ENCOUNTER — Ambulatory Visit (INDEPENDENT_AMBULATORY_CARE_PROVIDER_SITE_OTHER): Payer: Medicare PPO

## 2020-12-22 DIAGNOSIS — Z0000 Encounter for general adult medical examination without abnormal findings: Secondary | ICD-10-CM | POA: Diagnosis not present

## 2020-12-22 NOTE — Progress Notes (Signed)
Subjective:   Stacy [name] is a 75 y.o. female who presents for Medicare Annual (Subsequent) preventive examination.  Virtual Visit via Telephone Note  I connected with  Stacy [name] on 12/22/20 at 11:20 AM EST by telephone and verified that I am speaking with the correct person using two identifiers.  Location: Patient: home Provider: [place] Persons participating in the virtual visit: [place]   I discussed the limitations, risks, security and privacy concerns of performing an evaluation and management service by telephone and the availability of in person appointments. The patient expressed understanding and agreed to proceed.  Interactive audio and video telecommunications were attempted between this nurse and patient, however failed, due to patient having technical difficulties OR patient did not have access to video capability.  We continued and completed visit with audio only.  Some vital signs may be absent or patient reported.   Clemetine Marker, LPN   Review of Systems     Cardiac Risk Factors include: advanced age (>31men, >28 women);diabetes mellitus;hypertension     Objective:    There were no vitals filed for this visit. There is no height or weight on file to calculate BMI.  Advanced Directives 12/22/2020 10/07/2020 09/27/2020 10/15/2019 10/14/2018 10/11/2017 10/08/2016  Does Patient Have a Medical Advance Directive? No No No No No No No  Would patient like information on creating a medical advance directive? Yes (MAU/Ambulatory/Procedural Areas - Information given) No - Patient declined No - Patient declined Yes (MAU/Ambulatory/Procedural Areas - Information given) No - Patient declined Yes (MAU/Ambulatory/Procedural Areas - Information given) No - Patient declined    Current Medications (verified) Outpatient Encounter Medications as of 12/22/2020  Medication Sig   aspirin 81 MG EC tablet Take 81 mg by mouth daily.   Cholecalciferol (VITAMIN  D-3) 25 MCG (1000 UT) CAPS Take 1,000 Units by mouth daily.    Cinnamon 500 MG capsule Take 1,000 mg by mouth daily.   FISH OIL-BORAGE-FLAX-SAFFLOWER PO Take 2 capsules by mouth daily.   fluticasone (FLONASE) 50 MCG/ACT nasal spray Place 2 sprays into both nostrils daily. (Patient taking differently: Place 2 sprays into both nostrils daily as needed for allergies.)   loratadine (CLARITIN) 10 MG tablet Take 1 tablet (10 mg total) by mouth daily. (Patient taking differently: Take 10 mg by mouth daily as needed for allergies.)   meloxicam (MOBIC) 7.5 MG tablet Take 7.5 mg by mouth daily as needed for pain.   olopatadine (PATANOL) 0.1 % ophthalmic solution Place 1 drop into both eyes 2 (two) times daily as needed for allergies.   rosuvastatin (CRESTOR) 5 MG tablet TAKE 1 TABLET BY MOUTH 3 TIMES A WEEK   triamterene-hydrochlorothiazide (MAXZIDE-25) 37.5-25 MG tablet Take 1 tablet by mouth daily.   ketoconazole (NIZORAL) 2 % cream Apply 1 application topically daily. (Patient not taking: Reported on 12/22/2020)   [DISCONTINUED] HYDROcodone-acetaminophen (NORCO/VICODIN) 5-325 MG tablet Take 1 tablet by mouth every 4 (four) hours as needed for moderate pain.   No facility-administered encounter medications on file as of 12/22/2020.    Allergies (verified) Ramipril   History: Past Medical History:  Diagnosis Date   Allergy    Arthritis    Hyperlipidemia    Hypertension    Neoplasm of uncertain behavior of breast 2007   Past Surgical History:  Procedure Laterality Date   BREAST BIOPSY Right 2015   3 stereo biopsies. 2 benign. one was radial scar, no excision done for radial scar   BREAST BIOPSY Left 07/16/2017  BIPHASIC STROMAL AND EPITHELIAL LESION./ Dr Bary Castilla   BREAST BIOPSY Left 08/23/2020   [place] bx of mass 5:00 2cmfn, coil marker, path pending   BREAST EXCISIONAL BIOPSY Left 1968   x 3 per pt   BREAST EXCISIONAL BIOPSY Right 2007   benign   COLONOSCOPY  2011   DR,ISHAKIS   EXCISION  OF BREAST LESION Left 10/07/2020   Procedure: EXCISION OF BREAST LESION;  Surgeon: Robert Bellow, MD;  Location: [facility] ORS;  Service: General;  Laterality: Left;   FOOT SURGERY Bilateral 2004   HEEL SPUR SURGERY Left 12/15/2014   Dr. Earleen Newport at [facility]   Family History  Problem Relation Age of Onset   Breast cancer Sister 45   Arthritis Mother    Stroke Paternal Grandmother    Cancer Brother 38       kidney   Cancer Maternal Grandmother        Gallbladder   Social History   Socioeconomic History   Marital status: Married    Spouse name: Richard   Number of children: 1   Years of education: Not on file   Highest education level: 12th grade  Occupational History   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  Tobacco Use   Smoking status: Never   Smokeless tobacco: Never   Tobacco comments:    smoking cessation materials not required  Vaping Use   Vaping Use: Never used  Substance and Sexual Activity   Alcohol use: No    Alcohol/week: 0.0 standard drinks   Drug use: No   Sexual activity: Not Currently    Comment: Husband has prostate issues  Other Topics Concern   Not on file  Social History Narrative   Not on file   Social Determinants of Health   Financial Resource Strain: Low Risk    Difficulty of Paying Living Expenses: Not hard at all  Food Insecurity: No Food Insecurity   Worried About Charity fundraiser in the Last Year: Never true   [place] in the Last Year: Never true  Transportation Needs: No Transportation Needs   Lack of Transportation (Medical): No   Lack of Transportation (Non-Medical): No  Physical Activity: Inactive   Days of Exercise per Week: 0 days   Minutes of Exercise per Session: 0 min  Stress: No Stress Concern Present   Feeling of Stress : Not at all  Social Connections: Socially Integrated   Frequency of Communication with Friends and Family: More than three times a week   Frequency of Social Gatherings with  Friends and Family: Once a week   Attends Religious Services: More than 4 times per year   Active Member of [employer] or Organizations: Yes   Attends Archivist Meetings: 1 to 4 times per year   Marital Status: Married    Tobacco Counseling Counseling given: Not Answered Tobacco comments: smoking cessation materials not required   Clinical Intake:  Pre-visit preparation completed: Yes  Pain : No/denies pain     Nutritional Risks: None Diabetes: No  How often do you need to have someone help you when you read instructions, pamphlets, or other written materials from your doctor or pharmacy?: 1 - Never    Interpreter Needed?: No  Information entered by :: Clemetine Marker LPN   Activities of Daily Living In your present state of health, do you have any difficulty performing the following activities: 12/22/2020 09/27/2020  Hearing? N N  Vision? N N  Difficulty concentrating or making decisions? N N  Walking or climbing stairs? N Y  Comment - knee pain  Dressing or bathing? N N  Doing errands, shopping? N N  Preparing Food and eating ? N -  Using the Toilet? N -  In the past six months, have you accidently leaked urine? Y -  Comment wears liners for protection -  Do you have problems with loss of bowel control? N -  Managing your Medications? N -  Managing your Finances? N -  Housekeeping or managing your Housekeeping? N -  Some recent data might be hidden    Patient Care Team: Steele Sizer, MD as PCP - [facility], Reed Breech, OD as Consulting Physician (Optometry) Bary Castilla, Forest Gleason, MD as Consulting Physician (General Surgery) Germaine Pomfret, [facility] (Pharmacist) Gardiner Barefoot, DPM as Consulting Physician (Podiatry)  Indicate any recent Medical Services you may have received from other than [facility] providers in the past year (date may be approximate).     Assessment:   This is a routine wellness examination for Sharice.  Hearing/Vision screen Hearing  Screening - Comments:: Pt denies hearing difficulty  Vision Screening - Comments:: Annual vision screenings with Dr. Matilde Sprang  Dietary issues and exercise activities discussed: Current Exercise Habits: The patient does not participate in regular exercise at present, Exercise limited by: None identified   Goals Addressed             This Visit's Progress    Exercise 3x per week (20-30 min per time)   Not on track    Recommend to exercise 3x per week for 20-30 min per time.       Depression Screen PHQ 2/9 Scores 12/22/2020 07/26/2020 04/27/2020 01/22/2020 10/15/2019 07/22/2019 04/20/2019  PHQ - 2 Score 0 0 0 0 0 0 0  PHQ- 9 Score - - - - - 0 -    Fall Risk Fall Risk  12/22/2020 07/26/2020 04/27/2020 01/22/2020 10/15/2019  Falls in the past year? 0 0 0 0 0  Number falls in past yr: 0 0 0 0 0  Injury with Fall? 0 0 0 0 0  Risk for fall due to : No Fall Risks - - - No Fall Risks  Risk for fall due to: Comment - - - - -  Follow up Falls prevention discussed - - Falls evaluation completed Falls prevention discussed    FALL RISK PREVENTION PERTAINING TO THE HOME:  Any stairs in or around the home? Yes  If so, are there any without handrails? No  Home free of loose throw rugs in walkways, pet beds, electrical cords, etc? Yes  Adequate lighting in your home to reduce risk of falls? Yes   ASSISTIVE DEVICES UTILIZED TO PREVENT FALLS:  Life alert? No  Use of a cane, walker or w/c? No  Grab bars in the bathroom? Yes  Shower chair or bench in shower? Yes  Elevated toilet seat or a handicapped toilet? Yes   TIMED UP AND GO:  Was the test performed? No . Telephonic visit  Cognitive Function: Normal cognitive status assessed by direct observation by this Nurse Health Advisor. No abnormalities found.       6CIT Screen 10/15/2019 10/14/2018 10/11/2017 10/08/2016  What Year? 0 points 0 points 0 points 0 points  What month? 0 points 0 points 0 points 0 points  What time? 0 points 0 points 0 points 0  points  Count back from 20 0 points 0 points  0 points 0 points  Months in reverse 0 points 0 points 0 points 0 points  Repeat phrase 0 points 2 points 0 points 4 points  Total Score 0 2 0 4    Immunizations Immunization History  Administered Date(s) Administered   Fluad Quad(high Dose 65+) 02/23/2019, 01/22/2020   Influenza, High Dose Seasonal PF 10/07/2014, 10/10/2015, 10/11/2016, 10/11/2017   Influenza-Unspecified 03/20/2012, 10/01/2013   Janssen (J&J) SARS-COV-2 Vaccination 03/31/2019   Moderna Sars-Covid-2 Vaccination 03/31/2020   Pneumococcal Conjugate-13 10/07/2014   Pneumococcal Polysaccharide-23 07/27/2009, 10/10/2015   Tdap 07/16/2006   Zoster, Live 10/19/2009    TDAP status: Due, Education has been provided regarding the importance of this vaccine. Advised may receive this vaccine at local pharmacy or Health Dept. Aware to provide a copy of the vaccination record if obtained from local pharmacy or Health Dept. Verbalized acceptance and understanding.  Flu Vaccine status: Due, Education has been provided regarding the importance of this vaccine. Advised may receive this vaccine at local pharmacy or Health Dept. Aware to provide a copy of the vaccination record if obtained from local pharmacy or Health Dept. Verbalized acceptance and understanding.  Pneumococcal vaccine status: Up to date  Covid-19 vaccine status: Completed vaccines  Qualifies for Shingles Vaccine? Yes   Zostavax completed Yes   Shingrix Completed?: No.    Education has been provided regarding the importance of this vaccine. Patient has been advised to call insurance company to determine out of pocket expense if they have not yet received this vaccine. Advised may also receive vaccine at local pharmacy or Health Dept. Verbalized acceptance and understanding.  Screening Tests Health Maintenance  Topic Date Due   Zoster Vaccines- Shingrix (1 of 2) Never done   TETANUS/TDAP  07/15/2016   COVID-19 Vaccine (3  - Booster for Janssen series) 05/26/2020   INFLUENZA VACCINE  08/15/2020   FOOT EXAM  01/13/2021   OPHTHALMOLOGY EXAM  01/24/2021   HEMOGLOBIN A1C  01/26/2021   COLON CANCER SCREENING ANNUAL FOBT  01/26/2021   URINE MICROALBUMIN  07/26/2021   MAMMOGRAM  08/03/2021   Pneumonia Vaccine 1+ Years old  Completed   DEXA SCAN  Completed   Hepatitis C Screening  Completed   HPV VACCINES  Aged Out   COLONOSCOPY (Pts 45-86yrs Insurance coverage will need to be confirmed)  Discontinued   Fecal DNA (Cologuard)  Discontinued    Health Maintenance  Health Maintenance Due  Topic Date Due   Zoster Vaccines- Shingrix (1 of 2) Never done   TETANUS/TDAP  07/15/2016   COVID-19 Vaccine (3 - Booster for Janssen series) 05/26/2020   INFLUENZA VACCINE  08/15/2020    Colorectal cancer screening: Type of screening: FOBT/FIT. Completed 01/27/20. Repeat every 1 years  Mammogram status: Completed 08/03/20. Repeat every year  Bone Density status: Completed 08/03/20. Results reflect: Bone density results: NORMAL. Repeat every 2 years.  Lung Cancer Screening: (Low Dose CT Chest recommended if Age 45-80 years, 30 pack-year currently smoking OR have quit w/in 15years.) does not qualify.   Additional Screening:  Hepatitis C Screening: does qualify; Completed 09/28/11  Vision Screening: Recommended annual ophthalmology exams for early detection of glaucoma and other disorders of the eye. Is the patient up to date with their annual eye exam?  Yes  Who is the provider or what is the name of the office in which the patient attends annual eye exams? Dr.Nice.   Dental Screening: Recommended annual dental exams for proper oral hygiene  Community Resource Referral / Chronic Care Management:  CRR required this visit?  No   CCM required this visit?  No      Plan:     I have personally reviewed and noted the following in the patient's chart:   Medical and social history Use of alcohol, tobacco or illicit  drugs  Current medications and supplements including opioid prescriptions.  Functional ability and status Nutritional status Physical activity Advanced directives List of other physicians Hospitalizations, surgeries, and ER visits in previous 12 months Vitals Screenings to include cognitive, depression, and falls Referrals and appointments  In addition, I have reviewed and discussed with patient certain preventive protocols, quality metrics, and best practice recommendations. A written personalized care plan for preventive services as well as general preventive health recommendations were provided to patient.     Clemetine Marker, LPN   [id number]   Nurse Notes: none

## 2020-12-22 NOTE — Patient Instructions (Signed)
[name] , Thank you for taking time to come for your Medicare Wellness Visit. I appreciate your ongoing commitment to your health goals. Please review the following plan we discussed and let me know if I can assist you in the future.   Screening recommendations/referrals: Colonoscopy: FOBT 01/27/20; repeat yearly Mammogram: done 08/03/20 Bone Density: done 08/03/20 Recommended yearly ophthalmology/optometry visit for glaucoma screening and checkup Recommended yearly dental visit for hygiene and checkup  Vaccinations: Influenza vaccine: due Pneumococcal vaccine: done 10/10/15 Tdap vaccine: due Shingles vaccine: Shingrix discussed. Please contact your pharmacy for coverage information.  Covid-19:done 03/31/19 & 03/31/20  Advanced directives: Advance directive discussed with you today. I have provided a copy for you to complete at home and have notarized. Once this is complete please bring a copy in to our office so we can scan it into your chart.   Conditions/risks identified: Recommend increasing physical activity   Next appointment: Follow up in one year for your annual wellness visit    Preventive Care 65 Years and Older, Female Preventive care refers to lifestyle choices and visits with your health care provider that can promote health and wellness. What does preventive care include? A yearly physical exam. This is also called an annual well check. Dental exams once or twice a year. Routine eye exams. Ask your health care provider how often you should have your eyes checked. Personal lifestyle choices, including: Daily care of your teeth and gums. Regular physical activity. Eating a healthy diet. Avoiding tobacco and drug use. Limiting alcohol use. Practicing safe sex. Taking low-dose aspirin every day. Taking vitamin and mineral supplements as recommended by your health care provider. What happens during an annual well check? The services and screenings done by your health care  provider during your annual well check will depend on your age, overall health, lifestyle risk factors, and family history of disease. Counseling  Your health care provider may ask you questions about your: Alcohol use. Tobacco use. Drug use. Emotional well-being. Home and relationship well-being. Sexual activity. Eating habits. History of falls. Memory and ability to understand (cognition). Work and work Statistician. Reproductive health. Screening  You may have the following tests or measurements: Height, weight, and BMI. Blood pressure. Lipid and cholesterol levels. These may be checked every 5 years, or more frequently if you are over 15 years old. Skin check. Lung cancer screening. You may have this screening every year starting at age 8 if you have a 30-pack-year history of smoking and currently smoke or have quit within the past 15 years. Fecal occult blood test (FOBT) of the stool. You may have this test every year starting at age 38. Flexible sigmoidoscopy or colonoscopy. You may have a sigmoidoscopy every 5 years or a colonoscopy every 10 years starting at age 46. Hepatitis C blood test. Hepatitis B blood test. Sexually transmitted disease (STD) testing. Diabetes screening. This is done by checking your blood sugar (glucose) after you have not eaten for a while (fasting). You may have this done every 1-3 years. Bone density scan. This is done to screen for osteoporosis. You may have this done starting at age 36. Mammogram. This may be done every 1-2 years. Talk to your health care provider about how often you should have regular mammograms. Talk with your health care provider about your test results, treatment options, and if necessary, the need for more tests. Vaccines  Your health care provider may recommend certain vaccines, such as: Influenza vaccine. This is recommended every year. Tetanus,  diphtheria, and acellular pertussis (Tdap, Td) vaccine. You may need a Td  booster every 10 years. Zoster vaccine. You may need this after age 89. Pneumococcal 13-valent conjugate (PCV13) vaccine. One dose is recommended after age 58. Pneumococcal polysaccharide (PPSV23) vaccine. One dose is recommended after age 75. Talk to your health care provider about which screenings and vaccines you need and how often you need them. This information is not intended to replace advice given to you by your health care provider. Make sure you discuss any questions you have with your health care provider. Document Released: 01/28/2015 Document Revised: 09/21/2015 Document Reviewed: 11/02/2014 Elsevier Interactive Patient Education  2017 [place] Prevention in the Home Falls can cause injuries. They can happen to people of all ages. There are many things you can do to make your home safe and to help prevent falls. What can I do on the outside of my home? Regularly fix the edges of walkways and driveways and fix any cracks. Remove anything that might make you trip as you walk through a door, such as a raised step or threshold. Trim any bushes or trees on the path to your home. Use bright outdoor lighting. Clear any walking paths of anything that might make someone trip, such as rocks or tools. Regularly check to see if handrails are loose or broken. Make sure that both sides of any steps have handrails. Any raised decks and porches should have guardrails on the edges. Have any leaves, snow, or ice cleared regularly. Use sand or salt on walking paths during winter. Clean up any spills in your garage right away. This includes oil or grease spills. What can I do in the bathroom? Use night lights. Install grab bars by the toilet and in the tub and shower. Do not use towel bars as grab bars. Use non-skid mats or decals in the tub or shower. If you need to sit down in the shower, use a plastic, non-slip stool. Keep the floor dry. Clean up any water that spills on the floor  as soon as it happens. Remove soap buildup in the tub or shower regularly. Attach bath mats securely with double-sided non-slip rug tape. Do not have throw rugs and other things on the floor that can make you trip. What can I do in the bedroom? Use night lights. Make sure that you have a light by your bed that is easy to reach. Do not use any sheets or blankets that are too big for your bed. They should not hang down onto the floor. Have a firm chair that has side arms. You can use this for support while you get dressed. Do not have throw rugs and other things on the floor that can make you trip. What can I do in the kitchen? Clean up any spills right away. Avoid walking on wet floors. Keep items that you use a lot in easy-to-reach places. If you need to reach something above you, use a strong step stool that has a grab bar. Keep electrical cords out of the way. Do not use floor polish or wax that makes floors slippery. If you must use wax, use non-skid floor wax. Do not have throw rugs and other things on the floor that can make you trip. What can I do with my stairs? Do not leave any items on the stairs. Make sure that there are handrails on both sides of the stairs and use them. Fix handrails that are broken or loose. Make  sure that handrails are as long as the stairways. Check any carpeting to make sure that it is firmly attached to the stairs. Fix any carpet that is loose or worn. Avoid having throw rugs at the top or bottom of the stairs. If you do have throw rugs, attach them to the floor with carpet tape. Make sure that you have a light switch at the top of the stairs and the bottom of the stairs. If you do not have them, ask someone to add them for you. What else can I do to help prevent falls? Wear shoes that: Do not have high heels. Have rubber bottoms. Are comfortable and fit you well. Are closed at the toe. Do not wear sandals. If you use a stepladder: Make sure that it is  fully opened. Do not climb a closed stepladder. Make sure that both sides of the stepladder are locked into place. Ask someone to hold it for you, if possible. Clearly mark and make sure that you can see: Any grab bars or handrails. First and last steps. Where the edge of each step is. Use tools that help you move around (mobility aids) if they are needed. These include: Canes. Walkers. Scooters. Crutches. Turn on the lights when you go into a dark area. Replace any light bulbs as soon as they burn out. Set up your furniture so you have a clear path. Avoid moving your furniture around. If any of your floors are uneven, fix them. If there are any pets around you, be aware of where they are. Review your medicines with your doctor. Some medicines can make you feel dizzy. This can increase your chance of falling. Ask your doctor what other things that you can do to help prevent falls. This information is not intended to replace advice given to you by your health care provider. Make sure you discuss any questions you have with your health care provider. Document Released: 10/28/2008 Document Revised: 06/09/2015 Document Reviewed: 02/05/2014 Elsevier Interactive Patient Education  2017 [employer].

## 2020-12-23 ENCOUNTER — Other Ambulatory Visit: Payer: Self-pay | Admitting: Family Medicine

## 2020-12-23 DIAGNOSIS — E1159 Type 2 diabetes mellitus with other circulatory complications: Secondary | ICD-10-CM

## 2020-12-27 ENCOUNTER — Telehealth: Payer: Self-pay

## 2020-12-27 NOTE — Progress Notes (Signed)
° ° °  Chronic Care Management Pharmacy Assistant   Name: Stacy [name]  MRN: [id number] DOB: 1945/04/14  Patient called to reminded of her telephone appointment with Junius Argyle, CPP on 12/14 @ 0800  Patient stated she forgot to put to call on her calender, and stated 0800 was a little early for her so she requested to reschedule the appointment with CPP. She advised she has an upcoming appointment with PCP on 01/02/2021 so she should be okay. I was able to reschedule the appointment for 02/15/2021 @ 0845 am. Patient advised I would give her a call a day prior to remind her about this appointment.   Star Rating Drug: Rosuvastatin 5 mg last filled on 09/21/2020 for an 84-Day supply with [facility]  Any gaps in medications fill history? None  Care Gaps: Zoster Vaccine Tetanus/TDAP Vaccine COVID-19 Vaccine Booster 3 Influenza Vaccine   [name], CPA/CMA Clinical Pharmacist Assistant Phone: [phone]

## 2020-12-28 ENCOUNTER — Telehealth: Payer: Self-pay

## 2020-12-28 ENCOUNTER — Other Ambulatory Visit: Payer: Self-pay | Admitting: Family Medicine

## 2020-12-28 DIAGNOSIS — R7309 Other abnormal glucose: Secondary | ICD-10-CM | POA: Diagnosis not present

## 2020-12-28 DIAGNOSIS — H10233 Serous conjunctivitis, except viral, bilateral: Secondary | ICD-10-CM | POA: Diagnosis not present

## 2020-12-28 DIAGNOSIS — H1045 Other chronic allergic conjunctivitis: Secondary | ICD-10-CM | POA: Diagnosis not present

## 2020-12-28 DIAGNOSIS — H25013 Cortical age-related cataract, bilateral: Secondary | ICD-10-CM | POA: Diagnosis not present

## 2020-12-28 DIAGNOSIS — H00025 Hordeolum internum left lower eyelid: Secondary | ICD-10-CM | POA: Diagnosis not present

## 2020-12-28 DIAGNOSIS — H04123 Dry eye syndrome of bilateral lacrimal glands: Secondary | ICD-10-CM | POA: Diagnosis not present

## 2020-12-28 DIAGNOSIS — H353131 Nonexudative age-related macular degeneration, bilateral, early dry stage: Secondary | ICD-10-CM | POA: Diagnosis not present

## 2020-12-28 DIAGNOSIS — E1169 Type 2 diabetes mellitus with other specified complication: Secondary | ICD-10-CM

## 2020-12-28 DIAGNOSIS — E785 Hyperlipidemia, unspecified: Secondary | ICD-10-CM

## 2020-12-28 DIAGNOSIS — H2513 Age-related nuclear cataract, bilateral: Secondary | ICD-10-CM | POA: Diagnosis not present

## 2020-12-30 NOTE — Progress Notes (Signed)
Name: Stacy [name]   MRN: [id number]    DOB: 28-Jun-1945   Date:01/02/2021       Progress Note  Subjective  Chief Complaint  Follow Up  HPI  DMII: doing well, on diet only, can't take ACE caused angioedema, last urine micro negative  Denies polyphagia, polydipsia or polyuria.  She is following a diabetic diet. She has been compliant with Crestor 5 mg M, W and Fridays , last LDL was at goal at 78  She has onychomycosis and gets nail trimmed by podiatrist , last visit 11/2. She has associated HTN, dyslipidemia also     HTN: taking diuretic, tolerating it well. Denies chest pain, dizziness, palpitation, orthopnea  or SOB, she has chronic lower extremity swelling , she states better with compression stocking hoses , she saw vascular surgeon and was advised to continue compression stocking hoses   Obesity: she has DM and HTN, discussed life style modification.She lost a few pounds since last visit, she replaced ice cream with popsicles and is doing well , she is avoiding potatoes   Vitamin D deficiency: continue supplementation , reviewed last labs and at goal, continue supplementation . Unchanged    AR: she only taking medication prn, currently no rhinorrhea, nasal congestion or rhinorrhea, she sees Dr. Matilde Sprang for eye irritation and pruritis   Eye infection: she is currently using topical medication and augmentin    Patient Active Problem List   Diagnosis Date Noted   Hyperlipidemia associated with type 2 diabetes mellitus ([place]) 01/14/2020   Onychomycosis of multiple toenails with type 2 diabetes mellitus ([place]) 01/23/2019   Pain due to onychomycosis of toenails of both feet 10/06/2018   Diabetes mellitus without complication ([place]) 17/79/3903   Leg edema 12/11/2016   Abnormal EKG 12/10/2016   Radial scar of breast 05/30/2015   History of foot surgery 12/15/2014   Hypertension, benign 10/07/2014   Seasonal allergies 10/07/2014   Diabetes mellitus type 2, diet-controlled ([place]) 10/07/2014    Vitamin D deficiency 10/07/2014   Varicose veins 10/07/2014   Left Achilles tendinitis 10/07/2014   Family history of breast cancer 05/12/2012   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 2 benign. one was radial scar, no excision done for radial scar   BREAST BIOPSY Left 07/16/2017   BIPHASIC STROMAL AND EPITHELIAL LESION./ Dr Bary Castilla   BREAST BIOPSY Left 08/23/2020   [place] bx of mass 5:00 2cmfn, coil marker, path pending   BREAST EXCISIONAL BIOPSY Left 1968   x 3 per pt   BREAST EXCISIONAL BIOPSY Right 2007   benign   COLONOSCOPY  2011   DR,ISHAKIS   EXCISION OF BREAST LESION Left 10/07/2020   Procedure: EXCISION OF BREAST LESION;  Surgeon: Robert Bellow, MD;  Location: [facility] ORS;  Service: General;  Laterality: Left;   FOOT SURGERY Bilateral 2004   HEEL SPUR SURGERY Left 12/15/2014   Dr. Earleen Newport at [facility]    Family History  Problem Relation Age of Onset   Breast cancer Sister 31   Arthritis Mother    Stroke Paternal Grandmother    Cancer Brother 31       kidney   Cancer Maternal Grandmother        Gallbladder    Social History   Tobacco Use   Smoking status: Never   Smokeless tobacco: Never   Tobacco comments:  smoking cessation materials not required  Substance Use Topics   Alcohol use: No    Alcohol/week: 0.0 standard drinks     Current Outpatient Medications:    aspirin 81 MG EC tablet, Take 81 mg by mouth daily., Disp: , Rfl:    Cholecalciferol (VITAMIN D-3) 25 MCG (1000 UT) CAPS, Take 1,000 Units by mouth daily. , Disp: , Rfl:    Cinnamon 500 MG capsule, Take 1,000 mg by mouth daily., Disp: , Rfl:    FISH OIL-BORAGE-FLAX-SAFFLOWER PO, Take 2 capsules by mouth daily., Disp: , Rfl:    fluticasone (FLONASE) 50 MCG/ACT nasal spray, Place 2 sprays into both nostrils daily. (Patient taking differently: Place 2 sprays into both nostrils  daily as needed for allergies.), Disp: 48 g, Rfl: 1   loratadine (CLARITIN) 10 MG tablet, Take 1 tablet (10 mg total) by mouth daily. (Patient taking differently: Take 10 mg by mouth daily as needed for allergies.), Disp: 90 tablet, Rfl: 1   meloxicam (MOBIC) 7.5 MG tablet, Take 7.5 mg by mouth daily as needed for pain., Disp: , Rfl:    olopatadine (PATANOL) 0.1 % ophthalmic solution, Place 1 drop into both eyes 2 (two) times daily as needed for allergies., Disp: , Rfl:    rosuvastatin (CRESTOR) 5 MG tablet, TAKE 1 TABLET BY MOUTH 3 TIMES A WEEK, Disp: 36 tablet, Rfl: 0   triamterene-hydrochlorothiazide (MAXZIDE-25) 37.5-25 MG tablet, TAKE 1 TABLET BY MOUTH DAILY, Disp: 90 tablet, Rfl: 0   ketoconazole (NIZORAL) 2 % cream, Apply 1 application topically daily. (Patient not taking: Reported on 12/22/2020), Disp: 60 g, Rfl: 0  Allergies  Allergen Reactions   Ramipril Swelling and Other (See Comments)    lips    I personally reviewed active problem list, medication list, allergies, family history, social history, health maintenance with the patient/caregiver today.   ROS  Constitutional: Negative for fever or weight change.  Respiratory: Negative for cough and shortness of breath.   Cardiovascular: Negative for chest pain or palpitations.  Gastrointestinal: Negative for abdominal pain, no bowel changes.  Musculoskeletal: Negative for gait problem or joint swelling.  Skin: Negative for rash.  Neurological: Negative for dizziness or headache.  No other specific complaints in a complete review of systems (except as listed in HPI above).   Objective  Vitals:   01/02/21 1053  BP: 124/78  Pulse: 83  Resp: 16  Temp: 98.1 F (36.7 C)  SpO2: 97%  Weight: 192 lb (87.1 kg)  Height: 5\' 7"  (1.702 m)    Body mass index is 30.07 kg/m.  Physical Exam  Constitutional: Patient appears well-developed and well-nourished. Obese  No distress.  HEENT: head atraumatic, normocephalic, pupils equal  and reactive to light, neck supple Cardiovascular: Normal rate, regular rhythm and normal heart sounds.  No murmur heard. No BLE edema. Pulmonary/Chest: Effort normal and breath sounds normal. No respiratory distress. Abdominal: Soft.  There is no tenderness. Psychiatric: Patient has a normal mood and affect. behavior is normal. Judgment and thought content normal.   Recent Results (from the past 2160 hour(s))  Surgical pathology     Status: None   Collection Time: 10/07/20  8:19 AM  Result Value Ref Range   SURGICAL PATHOLOGY      SURGICAL PATHOLOGY CASE: [id number] PATIENT: Ryin Hentges Surgical Pathology Report     Specimen Submitted: A. Breast, left  Clinical History: Left breast mass/phyllodes tumor    DIAGNOSIS: A. BREAST, LEFT; EXCISION: - BENIGN PHYLLODES TUMOR, 1.1 CM, PRESENT AT INKED AND  CAUTERIZED EDGE OF UNORIENTED TISSUE FRAGMENT. - CLIP AND BIOPSY SITE IDENTIFIED IN UNORIENTED TISSUE FRAGMENT. - ORIENTED TISSUE FRAGMENT SHOWING BENIGN MAMMARY PARENCHYMA WITH FIBROCYSTIC AND APOCRINE CHANGES. - NEGATIVE FOR MALIGNANCY.  Comment: Benign phyllodes tumor is present at the cauterized edge of the unoriented tissue fragment containing the coil-shaped biopsy clip and biopsy site changes. It is unclear from histologic evaluation if this represents true margin, or abuts the additional, oriented tissue. Clinical correlation is recommended.  GROSS DESCRIPTION: A. Labeled: Left breast mass; marker on specimen does not contain clip, unmarked specimen contains clip Received: Fre sh Specimen radiograph image(s) available for review Radiographic findings: There is a radiograph of the unoriented fragment with a grid demonstrating the presence of a clip.  The specimen is received within a sterile container and not within the container that has the grid. Time in fixative: Collected at 8:19 AM on 10/07/2020 and placed in formalin at 9:25 AM on 10/07/2020 Cold ischemic  time: Over 1 hour Total fixation time: Approximately 10.5 hours Type of procedure: Excision of breast lesion Location / laterality of specimen: Left breast Orientation of specimen: 1 fragment is received unoriented and is entirely inked blue.  1 fragment is received oriented with sutured metallic clips labeled cranial, medial, and lateral.  Both fragments are inked at the time of grossing. Inking: Anterior = green Inferior = blue Lateral = orange Medial = yellow Posterior = black Superior = red Size of specimen: Unoriented fragment: 3.7 x 2.5 x 1.2 cm; oriented fragment: 3.2 (medi al to lateral) x 2.7 (superior to inferior) x 1.4 (anterior to posterior) cm Skin: No distinct skin is grossly identified on either fragment.  Biopsy site: The unoriented fragment has a single biopsy site which contains a coil shaped clip. Number of discrete masses: 1 Size of mass(es): 1.1 x 0.7 x 0.6 cm Description of mass(es): The mass is well-circumscribed with a gelatinous cystlike area and peripheral areas of tan soft tissue. Distance between masses/clips: The clip is located within the mass. Margins: Less than 0.1 cm Description of remainder of tissue: 1.4 cm from the mass there is a well-circumscribed are of firm fibrous tissue, 0.4 x 0.3 x 0.3 cm.  This area is less than 0.1 cm to the closest inked margin.  The remainder of both fragments are comprised of yellow lobulated adipose tissue admixed with tan focally firm fibrous tissue.  The fat to fibrous tissue ratio is 90:10.  Block summary (the mass is submitted entirely and approximately 20% of the unoriente d fragment remains.  The oriented fragment is submitted entirely as it could not grossly be determined how this fragment abuts the unoriented fragment.): 1 - 6 - unoriented fragment      1 - resection margin A with mass, perpendicularly sectioned      2 - 3 - remaining mass with closest approach to inked peripheral margin           2 -  clip site      4 - representative grossly normal breast tissue between mass and additional area of firm fibrous tissue      5 - area of firm fibrous tissue with closest approach to inked margin, submitted entirely      6 - resection margin B, perpendicularly sectioned 7 - 15 - oriented fragment, submitted entirely      7 - lateral margin, perpendicularly sectioned      8 - 14 - central sections      15 - medial margin,  perpendicularly sectioned  RB 10/07/2020   Final Diagnosis performed by Allena Napoleon, MD.   Electronically signed 10/10/2020 10:51:07AM The electronic signature indicates that the named Attending Pathologist has evalua ted the specimen Technical component performed at [place], [address], [place], [place] [address] Lab: [phone] Dir: Rush Farmer, MD, MMM  Professional component performed at [facility], [facility], [place], [place], [place] [address] Lab: [phone] Dir: Dellia Nims. Rubinas, MD   POCT HgB A1C     Status: Abnormal   Collection Time: 01/02/21 11:03 AM  Result Value Ref Range   Hemoglobin A1C 6.0 (A) 4.0 - 5.6 %   HbA1c POC (<> result, manual entry)     HbA1c, POC (prediabetic range)     HbA1c, POC (controlled diabetic range)        PHQ2/9: Depression screen [facility] 2/9 01/02/2021 12/22/2020 07/26/2020 04/27/2020 01/22/2020  Decreased Interest 0 0 0 0 0  Down, Depressed, Hopeless 0 0 0 0 0  PHQ - 2 Score 0 0 0 0 0  Altered sleeping 0 - - - -  Tired, decreased energy 0 - - - -  Change in appetite 0 - - - -  Feeling bad or failure about yourself  0 - - - -  Trouble concentrating 0 - - - -  Moving slowly or fidgety/restless 0 - - - -  Suicidal thoughts 0 - - - -  PHQ-9 Score 0 - - - -  Difficult doing work/chores - - - - -  Some recent data might be hidden    phq 9 is negative   Fall Risk: Fall Risk  01/02/2021 12/22/2020 07/26/2020 04/27/2020 01/22/2020  Falls in the past year? 0 0 0 0 0  Number falls in past yr: 0 0 0 0  0  Injury with Fall? 0 0 0 0 0  Risk for fall due to : No Fall Risks No Fall Risks - - -  Risk for fall due to: Comment - - - - -  Follow up Falls prevention discussed Falls prevention discussed - - Falls evaluation completed      Functional Status Survey: Is the patient deaf or have difficulty hearing?: No Does the patient have difficulty seeing, even when wearing glasses/contacts?: No Does the patient have difficulty concentrating, remembering, or making decisions?: No Does the patient have difficulty walking or climbing stairs?: No Does the patient have difficulty dressing or bathing?: No Does the patient have difficulty doing errands alone such as visiting a doctor's office or shopping?: No    Assessment & Plan  1. Hypertension associated with type 2 diabetes mellitus (HCC)  - POCT HgB A1C - triamterene-hydrochlorothiazide (MAXZIDE-25) 37.5-25 MG tablet; Take 1 tablet by mouth daily.  Dispense: 90 tablet; Refill: 0  2. Dyslipidemia associated with type 2 diabetes mellitus ([place])   3. Onychomycosis of multiple toenails with type 2 diabetes mellitus (HCC)   4. Stasis dermatitis of both legs   5. Primary osteoarthritis of left knee   6. Need for immunization against influenza  - Flu Vaccine QUAD High Dose(Fluad)  7. Bilateral lower extremity edema   8. Vitamin D deficiency   9. Seasonal allergies   10. Need for Tdap vaccination  - Tdap (ADACEL) 05-16-13.5 LF-MCG/0.5 injection; Inject 0.5 mLs into the muscle once for 1 dose.  Dispense: 0.5 mL; Refill: 0  11. Need for shingles vaccine  - Zoster Vaccine Adjuvanted [facility]) injection; Inject 0.5 mLs into the muscle once for 1 dose.  Dispense:  0.5 mL; Refill: 1  12. Hyperlipidemia associated with type 2 diabetes mellitus (HCC)  - rosuvastatin (CRESTOR) 5 MG tablet; Take 1 tablet (5 mg total) by mouth 3 (three) times a week.  Dispense: 36 tablet; Refill: 1

## 2021-01-02 ENCOUNTER — Ambulatory Visit (INDEPENDENT_AMBULATORY_CARE_PROVIDER_SITE_OTHER): Payer: Medicare PPO | Admitting: Family Medicine

## 2021-01-02 ENCOUNTER — Encounter: Payer: Self-pay | Admitting: Family Medicine

## 2021-01-02 VITALS — BP 124/78 | HR 83 | Temp 98.1°F | Resp 16 | Ht 67.0 in | Wt 192.0 lb

## 2021-01-02 DIAGNOSIS — J302 Other seasonal allergic rhinitis: Secondary | ICD-10-CM

## 2021-01-02 DIAGNOSIS — E785 Hyperlipidemia, unspecified: Secondary | ICD-10-CM

## 2021-01-02 DIAGNOSIS — I152 Hypertension secondary to endocrine disorders: Secondary | ICD-10-CM

## 2021-01-02 DIAGNOSIS — R600 Localized edema: Secondary | ICD-10-CM

## 2021-01-02 DIAGNOSIS — E1169 Type 2 diabetes mellitus with other specified complication: Secondary | ICD-10-CM | POA: Diagnosis not present

## 2021-01-02 DIAGNOSIS — I872 Venous insufficiency (chronic) (peripheral): Secondary | ICD-10-CM

## 2021-01-02 DIAGNOSIS — M1712 Unilateral primary osteoarthritis, left knee: Secondary | ICD-10-CM | POA: Diagnosis not present

## 2021-01-02 DIAGNOSIS — E559 Vitamin D deficiency, unspecified: Secondary | ICD-10-CM

## 2021-01-02 DIAGNOSIS — B351 Tinea unguium: Secondary | ICD-10-CM

## 2021-01-02 DIAGNOSIS — Z23 Encounter for immunization: Secondary | ICD-10-CM

## 2021-01-02 DIAGNOSIS — E1159 Type 2 diabetes mellitus with other circulatory complications: Secondary | ICD-10-CM

## 2021-01-02 LAB — POCT GLYCOSYLATED HEMOGLOBIN (HGB A1C): Hemoglobin A1C: 6 % — AB (ref 4.0–5.6)

## 2021-01-02 MED ORDER — SHINGRIX 50 MCG/0.5ML IM SUSR: 0.5000 mL | Freq: Once | INTRAMUSCULAR | 1 refills | Status: AC

## 2021-01-02 MED ORDER — TRIAMTERENE-HCTZ 37.5-25 MG PO TABS: 1.0000 | ORAL_TABLET | Freq: Every day | ORAL | 0 refills | Status: DC

## 2021-01-02 MED ORDER — ROSUVASTATIN CALCIUM 5 MG PO TABS: 5.0000 mg | ORAL_TABLET | ORAL | 1 refills | Status: DC

## 2021-01-02 MED ORDER — TETANUS-DIPHTH-ACELL PERTUSSIS 5-2-15.5 LF-MCG/0.5 IM SUSP: 0.5000 mL | Freq: Once | INTRAMUSCULAR | 0 refills | Status: AC

## 2021-01-02 NOTE — Patient Instructions (Signed)
COVID-19 bivalent booster

## 2021-01-25 DIAGNOSIS — H04123 Dry eye syndrome of bilateral lacrimal glands: Secondary | ICD-10-CM | POA: Diagnosis not present

## 2021-01-25 DIAGNOSIS — H10233 Serous conjunctivitis, except viral, bilateral: Secondary | ICD-10-CM | POA: Diagnosis not present

## 2021-01-25 DIAGNOSIS — R7309 Other abnormal glucose: Secondary | ICD-10-CM | POA: Diagnosis not present

## 2021-01-25 DIAGNOSIS — H2513 Age-related nuclear cataract, bilateral: Secondary | ICD-10-CM | POA: Diagnosis not present

## 2021-01-25 DIAGNOSIS — H1045 Other chronic allergic conjunctivitis: Secondary | ICD-10-CM | POA: Diagnosis not present

## 2021-01-25 DIAGNOSIS — H353131 Nonexudative age-related macular degeneration, bilateral, early dry stage: Secondary | ICD-10-CM | POA: Diagnosis not present

## 2021-01-25 DIAGNOSIS — H25013 Cortical age-related cataract, bilateral: Secondary | ICD-10-CM | POA: Diagnosis not present

## 2021-01-25 LAB — HM DIABETES EYE EXAM

## 2021-02-07 ENCOUNTER — Telehealth: Payer: Self-pay

## 2021-02-07 NOTE — Progress Notes (Signed)
Chronic Care Management Pharmacy Assistant   Name: Stacy [name]  MRN: [id number] DOB: 20-Dec-1945  Reason for Encounter: Hypertension Disease State Call   Recent office visits:  01/02/2021 Steele Sizer, MD (PCP Office Visit) for Follow-up- No medication changes noted, lab order placed, patient instructed to return in 5 months.   12/22/2020 Delford Field, LPN (PCP Clinical Support) for Medicare Wellness Exam- Stopped: Hydrocodone-Acetaminophen 5-325 mg, no orders placed  Recent consult visits:  11/17/2020 Gardiner Barefoot, DPM (Podiatry) for Nail Problem- No medication changes noted, no orders placed, patient instructed to return in 10 weeks  Hospital visits:  None in previous 6 months  Medications: Outpatient Encounter Medications as of 02/07/2021  Medication Sig   aspirin 81 MG EC tablet Take 81 mg by mouth daily.   Cholecalciferol (VITAMIN D-3) 25 MCG (1000 UT) CAPS Take 1,000 Units by mouth daily.    Cinnamon 500 MG capsule Take 1,000 mg by mouth daily.   FISH OIL-BORAGE-FLAX-SAFFLOWER PO Take 2 capsules by mouth daily.   fluticasone (FLONASE) 50 MCG/ACT nasal spray Place 2 sprays into both nostrils daily. (Patient taking differently: Place 2 sprays into both nostrils daily as needed for allergies.)   loratadine (CLARITIN) 10 MG tablet Take 1 tablet (10 mg total) by mouth daily. (Patient taking differently: Take 10 mg by mouth daily as needed for allergies.)   meloxicam (MOBIC) 7.5 MG tablet Take 7.5 mg by mouth daily as needed for pain.   olopatadine (PATANOL) 0.1 % ophthalmic solution Place 1 drop into both eyes 2 (two) times daily as needed for allergies.   rosuvastatin (CRESTOR) 5 MG tablet Take 1 tablet (5 mg total) by mouth 3 (three) times a week.   triamterene-hydrochlorothiazide (MAXZIDE-25) 37.5-25 MG tablet Take 1 tablet by mouth daily.   No facility-administered encounter medications on file as of 02/07/2021.   Care Gaps: Zoster Vaccine Tetanus/TDAP COVID-19  Vaccine Colon Cancer Screening Annual FOBT  Star Rating Drugs: Rosuvastatin 5 mg last filled on 01/02/2021 for a 84 Day supply with [facility]  Reviewed chart prior to disease state call. Spoke with patient regarding BP  Recent Office Vitals: BP Readings from Last 3 Encounters:  01/02/21 124/78  10/07/20 (!) 157/90  09/08/20 (!) 166/80   Pulse Readings from Last 3 Encounters:  01/02/21 83  10/07/20 79  09/08/20 71    Wt Readings from Last 3 Encounters:  01/02/21 192 lb (87.1 kg)  10/07/20 194 lb (88 kg)  09/27/20 197 lb 1.5 oz (89.4 kg)     Kidney Function Lab Results  Component Value Date/Time   CREATININE 0.81 07/26/2020 12:02 PM   CREATININE 0.90 07/22/2019 11:10 AM   GFRNONAA 64 07/22/2019 11:10 AM   GFRAA 74 07/22/2019 11:10 AM    BMP Latest Ref Rng & Units 07/26/2020 08/03/2019 07/22/2019  Glucose 65 - 99 mg/dL 93 - 101(H)  BUN 7 - 25 mg/dL 15 - 13  Creatinine 0.60 - 1.00 mg/dL 0.81 - 0.90  BUN/Creat Ratio 6 - 22 (calc) NOT APPLICABLE - NOT APPLICABLE  Sodium 390 - 146 mmol/L 140 - 141  Potassium 3.5 - 5.3 mmol/L 4.2 4.9 5.7(H)  Chloride 98 - 110 mmol/L 103 - 103  CO2 20 - 32 mmol/L 30 - 34(H)  Calcium 8.6 - 10.4 mg/dL 9.5 - 10.3    Current antihypertensive regimen:  Triaterene-HCTZ 37.5-25 mg 1 tablet daily   How often are you checking your Blood Pressure?  Patient does not check her BP at home she stated  last PCP appointment she had it was normal.  Current home BP readings: N/A  What recent interventions/DTPs have been made by any provider to improve Blood Pressure control since last CPP Visit: None ID  Any recent hospitalizations or ED visits since last visit with CPP? No  What diet changes have been made to improve Blood Pressure Control?  Patient follows a diabetic diet  What exercise is being done to improve your Blood Pressure Control?  Patient does not exercise  Adherence Review: Is the patient currently on ACE/ARB medication? No Does  the patient have >5 day gap between last estimated fill dates? No  Patient reports that she is doing well. She denies any ill symptoms today. Patient stated she saw PCP in December and her blood pressure was normal. Patient denies having any hypertensive symptoms. Patient reports taking all medications as directed, and denies needing any refills today. Patient has no concerns or issues at this time.   Patient has a telephone appointment with Junius Argyle, CPP on 02/15/2021 @ [place], [place] Pharmacist Assistant Phone: [phone]

## 2021-02-14 ENCOUNTER — Telehealth: Payer: Self-pay

## 2021-02-14 NOTE — Progress Notes (Signed)
° ° °  Chronic Care Management Pharmacy Assistant   Name: Stacy [name]  MRN: [id number] DOB: [birth year]  Patient called to be reminded of her telephone appointment with Junius Argyle, CPP on 02/15/2021 @ 0845  Patient aware of appointment date, time, and type of appointment (either telephone or in person). Patient aware to have/bring all medications, supplements, blood pressure and/or blood sugar logs to visit.  Questions: Are there any concerns you would like to discuss during your office visit? Nothing at this time  Are you having any problems obtaining your medications? Not at this time  Star Rating Drug: Rosuvastatin 5 mg last filled on 01/02/2021  for a 84-Day supply with [facility]  Any gaps in medications fill history? No  CARE GAPS: Zoster Vaccines Tetanus/TDAP COVID-19 Vaccine Booster 3 Colon Cancer Screening Annual FOBT  [name], CPA/CMA Clinical Pharmacist Assistant Phone: [phone]

## 2021-02-15 ENCOUNTER — Ambulatory Visit (INDEPENDENT_AMBULATORY_CARE_PROVIDER_SITE_OTHER): Payer: Medicare PPO

## 2021-02-15 DIAGNOSIS — E1169 Type 2 diabetes mellitus with other specified complication: Secondary | ICD-10-CM

## 2021-02-15 DIAGNOSIS — I10 Essential (primary) hypertension: Secondary | ICD-10-CM

## 2021-02-15 DIAGNOSIS — E785 Hyperlipidemia, unspecified: Secondary | ICD-10-CM

## 2021-02-15 DIAGNOSIS — M1712 Unilateral primary osteoarthritis, left knee: Secondary | ICD-10-CM

## 2021-02-15 NOTE — Patient Instructions (Signed)
Visit Information It was great speaking with you today!  Please let me know if you have any questions about our visit.   Goals Addressed             This Visit's Progress    Track and Manage My Blood Pressure-Hypertension   Not on track    Timeframe:  Long-Range Goal Priority:  High Start Date:  07/13/2020                            Expected End Date:   01/12/2021                     Follow Up within 90 days    - check blood pressure weekly    Why is this important?   You won't feel high blood pressure, but it can still hurt your blood vessels.  High blood pressure can cause heart or kidney problems. It can also cause a stroke.  Making lifestyle changes like losing a little weight or eating less salt will help.  Checking your blood pressure at home and at different times of the day can help to control blood pressure.  If the doctor prescribes medicine remember to take it the way the doctor ordered.  Call the office if you cannot afford the medicine or if there are questions about it.     Notes:         Patient Care Plan: General Pharmacy (Adult)     Problem Identified: Hypertension, Hyperlipidemia, Diabetes, Osteoarthritis, and Allergic Rhinitis   Priority: High     Long-Range Goal: Patient-Specific Goal   Start Date: 07/13/2020  Expected End Date: 02/15/2022  This Visit's Progress: On track  Recent Progress: On track  Priority: High  Note:   Current Barriers:  Unable to independently monitor therapeutic efficacy  Pharmacist Clinical Goal(s):  Patient will maintain control of blood pressure as evidenced by BP less than 140/90  through collaboration with PharmD and provider.   Interventions: 1:1 collaboration with Steele Sizer, MD regarding development and update of comprehensive plan of care as evidenced by provider attestation and co-signature Inter-disciplinary care team collaboration (see longitudinal plan of care) Comprehensive medication review performed;  medication list updated in electronic medical record  Hypertension (BP goal <140/90) -Controlled -Current treatment: Triamterene-HCTZ 37.5-25 mg daily: Appropriate, Effective, Safe, Accessible -Medications previously tried: NA  -Current home readings: Does not routinely monitor at home  -Current dietary habits: Monitors salt intake  -Current exercise habits: Walks a few times weekly -Denies hypotensive/hypertensive symptoms -Counseled to monitor BP at home weekly, document, and provide log at future appointments -Recommended to continue current medication  Hyperlipidemia: (LDL goal < 100) -Controlled -Current treatment: Rosuvastatin 5 mg three times weekly -Medications previously tried: NA  -Recommended to continue current medication  Diabetes (A1c goal <7%) -Controlled -Current medications: None -Medications previously tried: NA  -Recommended to continue current medication  Osteoarthritis (Goal: Maintain pain relief) -Not ideally controlled -Current treatment  Acetaminophen 500 mg every 6 hours as needed: Appropriate, Effective, Safe, Accessible [employer]'s Best Maximum Strength Pain Relief Cream with 4% lidocaine: Appropriate, Effective, Safe, Accessible Meloxicam 7.5 mg daily as needed: Appropriate, Effective, Safe, Accessible  -Medications previously tried: NA  -Shots have been ineffective at reducing knee pain, patient is debating knee replacement surgery.  -Takes Meloxicam only ~1x monthly  -Continue current medications  Patient Goals/Self-Care Activities Patient will:  - check blood pressure weekly, document, and provide at  future appointments  Follow Up Plan: Telephone follow up appointment with care management team member scheduled for:  08/16/2021 at 8:30 AM    Patient agreed to services and verbal consent obtained.   The patient verbalized understanding of instructions, educational materials, and care plan provided today and declined offer to receive copy of  patient instructions, educational materials, and care plan.   Stacy [name], [place] Pharmacist Practitioner  [facility] [phone]

## 2021-02-15 NOTE — Progress Notes (Signed)
Chronic Care Management Pharmacy Note  02/15/2021 Name:  Stacy [name] MRN:  [id number] DOB:  1946-01-07  Summary: Patient presents for CCM follow-up.   Recommendations/Changes made from today's visit: Continue current medications  Plan: CPP Follow-up in 6 months    Subjective: Stacy [name] is an 76 y.o. year old female who is a primary patient of Steele Sizer, MD.  The CCM team was consulted for assistance with disease management and care coordination needs.    Engaged with patient by telephone for follow up visit in response to provider referral for pharmacy case management and/or care coordination services.   Consent to Services:  The patient was given information about Chronic Care Management services, agreed to services, and gave verbal consent prior to initiation of services.  Please see initial visit note for detailed documentation.   Patient Care Team: Steele Sizer, MD as PCP - General Matilde Sprang Reed Breech, OD as Consulting Physician (Optometry) Bary Castilla, Forest Gleason, MD as Consulting Physician (General Surgery) Germaine Pomfret, [facility] (Pharmacist) Gardiner Barefoot, DPM as Consulting Physician (Podiatry)  Recent office visits: 01/02/21: Patient presented to Dr. Ancil Boozer for follow-up. Tdap, Zoster vaccines.  11/22/20: Patient presented to Clemetine Marker, LPN for AWV.    Recent consult visits: None noted.   Hospital visits: None in previous 6 months   Objective:  Lab Results  Component Value Date   CREATININE 0.81 07/26/2020   BUN 15 07/26/2020   GFRNONAA 64 07/22/2019   GFRAA 74 07/22/2019   NA 140 07/26/2020   K 4.2 07/26/2020   CALCIUM 9.5 07/26/2020   CO2 30 07/26/2020   GLUCOSE 93 07/26/2020    Lab Results  Component Value Date/Time   HGBA1C 6.0 (A) 01/02/2021 11:03 AM   HGBA1C 5.8 (A) 07/26/2020 11:28 AM   HGBA1C 6.2 (H) 02/23/2019 09:47 AM   HGBA1C 6.3 (H) 10/11/2017 10:58 AM   MICROALBUR 0.2 07/26/2020 12:02 PM   MICROALBUR 0.3 02/23/2019  09:47 AM   MICROALBUR Neg 10/11/2016 08:28 AM   MICROALBUR negative 10/10/2015 08:39 AM    Last diabetic Eye exam:  Lab Results  Component Value Date/Time   HMDIABEYEEXA No Retinopathy 01/25/2021 12:00 AM    Last diabetic Foot exam:  Lab Results  Component Value Date/Time   HMDIABFOOTEX Abnormal 11/16/2020 12:00 AM     Lab Results  Component Value Date   CHOL 147 07/26/2020   HDL 76 07/26/2020   LDLCALC 57 07/26/2020   TRIG 65 07/26/2020   CHOLHDL 1.9 07/26/2020    Hepatic Function Latest Ref Rng & Units 07/26/2020 07/22/2019 02/23/2019  Total Protein 6.1 - 8.1 g/dL 7.2 7.4 7.2  Albumin 3.6 - 5.1 g/dL - - -  AST 10 - 35 U/L _0 ALT 6 - 29 U/L _1 Alk Phosphatase 33 - 130 U/L - - -  Total Bilirubin 0.2 - 1.2 mg/dL 0.6 0.4 0.4    Lab Results  Component Value Date/Time   TSH 3.230 04/06/2015 09:02 AM    CBC Latest Ref Rng & Units 07/26/2020 07/22/2019 07/22/2018  WBC 3.8 - 10.8 Thousand/uL 3.4(L) 3.7(L) 4.1  Hemoglobin 11.7 - 15.5 g/dL 12.3 12.9 12.2  Hematocrit 35.0 - 45.0 % 37.6 40.1 36.5  Platelets 140 - 400 Thousand/uL 247 258 258    Lab Results  Component Value Date/Time   VD25OH 40 07/22/2018 11:13 AM   VD25OH 30 10/16/2016 09:17 AM    Clinical ASCVD: No  The 10-year ASCVD risk score (Arnett  et al., 2019) is: 25.9% °  Values used to calculate the score: °    Age: 75 years °    Sex: Female °    Is Non-Hispanic African American: Yes °    Diabetic: Yes °    Tobacco smoker: No °    Systolic Blood Pressure: 124 mmHg °    Is BP treated: Yes °    HDL Cholesterol: 76 mg/dL °    Total Cholesterol: 147 mg/dL   ° °Depression screen PHQ 2/9 01/02/2021 12/22/2020 07/26/2020  °Decreased Interest 0 0 0  °Down, Depressed, Hopeless 0 0 0  °PHQ - 2 Score 0 0 0  °Altered sleeping 0 - -  °Tired, decreased energy 0 - -  °Change in appetite 0 - -  °Feeling bad or failure about yourself  0 - -  °Trouble concentrating 0 - -  °Moving slowly or fidgety/restless 0 - -  °Suicidal  thoughts 0 - -  °PHQ-9 Score 0 - -  °Difficult doing work/chores - - -  °Some recent data might be hidden  °  °Social History  ° °Tobacco Use  °Smoking Status Never  °Smokeless Tobacco Never  °Tobacco Comments  ° smoking cessation materials not required  ° °BP Readings from Last 3 Encounters:  °01/02/21 124/78  °10/07/20 (!) 157/90  °09/08/20 (!) 166/80  ° °Pulse Readings from Last 3 Encounters:  °01/02/21 83  °10/07/20 79  °09/08/20 71  ° °Wt Readings from Last 3 Encounters:  °01/02/21 192 lb (87.1 kg)  °10/07/20 194 lb (88 kg)  °09/27/20 197 lb 1.5 oz (89.4 kg)  ° °BMI Readings from Last 3 Encounters:  °01/02/21 30.07 kg/m²  °10/07/20 31.31 kg/m²  °09/27/20 30.87 kg/m²  ° ° °Assessment/Interventions: Review of patient past medical history, allergies, medications, health status, including review of consultants reports, laboratory and other test data, was performed as part of comprehensive evaluation and provision of chronic care management services.  ° °SDOH:  (Social Determinants of Health) assessments and interventions performed: Yes °SDOH Interventions   ° °Flowsheet Row Most Recent Value  °SDOH Interventions   °Financial Strain Interventions Intervention Not Indicated  ° °  ° ° °SDOH Screenings  ° °Alcohol Screen: Low Risk   ° Last Alcohol Screening Score (AUDIT): 0  °Depression (PHQ2-9): Low Risk   ° PHQ-2 Score: 0  °Financial Resource Strain: Low Risk   ° Difficulty of Paying Living Expenses: Not hard at all  °Food Insecurity: No Food Insecurity  ° Worried About Running Out of Food in the Last Year: Never true  ° Ran Out of Food in the Last Year: Never true  °Housing: Low Risk   ° Last Housing Risk Score: 0  °Physical Activity: Inactive  ° Days of Exercise per Week: 0 days  ° Minutes of Exercise per Session: 0 min  °Social Connections: Socially Integrated  ° Frequency of Communication with Friends and Family: More than three times a week  ° Frequency of Social Gatherings with Friends and Family: Once a week   ° Attends Religious Services: More than 4 times per year  ° Active Member of Clubs or Organizations: Yes  ° Attends Club or Organization Meetings: 1 to 4 times per year  ° Marital Status: Married  °Stress: No Stress Concern Present  ° Feeling of Stress : Not at all  °Tobacco Use: Low Risk   ° Smoking Tobacco Use: Never  ° Smokeless Tobacco Use: Never  ° Passive Exposure: Not on file  °Transportation Needs:   Needs: No Transportation Needs   Lack of Transportation (Medical): No   Lack of Transportation (Non-Medical): No    CCM Care Plan  Allergies  Allergen Reactions   Ramipril Swelling and Other (See Comments)    lips    Medications Reviewed Today     Reviewed by Germaine Pomfret, [facility] (Pharmacist) on 02/15/21 at 0905  Med List Status: <None>   Medication Order Taking? Sig Documenting Provider Last Dose Status Informant  acetaminophen (TYLENOL) 500 MG tablet [id number] Yes Take 500 mg by mouth every 6 (six) hours as needed. [provider]  Active   aspirin 81 MG EC tablet [id number]  Take 81 mg by mouth daily. [provider]  Active Self  Cholecalciferol (VITAMIN D-3) 25 MCG (1000 UT) CAPS [id number]  Take 1,000 Units by mouth daily.  [provider]  Active Self  Cinnamon 500 MG capsule [id number]  Take 1,000 mg by mouth daily. [provider]  Active Self  FISH OIL-BORAGE-FLAX-SAFFLOWER PO [id number]  Take 2 capsules by mouth daily. [provider]  Active Self  fluticasone (FLONASE) 50 MCG/ACT nasal spray [id number]  Place 2 sprays into both nostrils daily.  Patient taking differently: Place 2 sprays into both nostrils daily as needed for allergies.   Steele Sizer, MD  Active Self  loratadine (CLARITIN) 10 MG tablet [id number]  Take 1 tablet (10 mg total) by mouth daily.  Patient taking differently: Take 10 mg by mouth daily as needed for allergies.   Steele Sizer, MD  Active Self  meloxicam (MOBIC) 7.5 MG tablet [id number]  Take 7.5 mg by mouth  daily as needed for pain. [provider]  Active Self  MISC NATURAL PRODUCTS EX [id number] Yes Apply topically daily as needed (Osteoarthritis). Baker's Best Maximum Strength Pain Relief Cream with 4% lidocaine [provider]  Active   olopatadine (PATANOL) 0.1 % ophthalmic solution [id number]  Place 1 drop into both eyes 2 (two) times daily as needed for allergies. [provider]  Active Self  rosuvastatin (CRESTOR) 5 MG tablet [id number]  Take 1 tablet (5 mg total) by mouth 3 (three) times a week. Steele Sizer, MD  Active   triamterene-hydrochlorothiazide [facility]) 37.5-25 MG tablet [id number]  Take 1 tablet by mouth daily. Steele Sizer, MD  Active             Patient Active Problem List   Diagnosis Date Noted   Hyperlipidemia associated with type 2 diabetes mellitus ([place]) 01/14/2020   Onychomycosis of multiple toenails with type 2 diabetes mellitus ([place]) 01/23/2019   Pain due to onychomycosis of toenails of both feet 10/06/2018   Leg edema 12/11/2016   Abnormal EKG 12/10/2016   Radial scar of breast 05/30/2015   History of foot surgery 12/15/2014   Hypertension, benign 10/07/2014   Seasonal allergies 10/07/2014   Diabetes mellitus type 2, diet-controlled ([place]) 10/07/2014   Vitamin D deficiency 10/07/2014   Varicose veins 10/07/2014   Left Achilles tendinitis 10/07/2014   Family history of breast cancer 05/12/2012    Immunization History  Administered Date(s) Administered   Fluad Quad(high Dose 65+) 02/23/2019, 01/22/2020, 01/02/2021   Influenza, High Dose Seasonal PF 10/07/2014, 10/10/2015, 10/11/2016, 10/11/2017   Influenza-Unspecified 03/20/2012, 10/01/2013   Janssen (J&J) SARS-COV-2 Vaccination 03/31/2019   Moderna Sars-Covid-2 Vaccination 03/31/2020   Pneumococcal Conjugate-13 10/07/2014   Pneumococcal Polysaccharide-23 07/27/2009, 10/10/2015   Tdap 07/16/2006   Zoster, Live 10/19/2009    Conditions to be addressed/monitored:   Hypertension, Hyperlipidemia, Diabetes, Osteoarthritis, and Allergic Rhinitis  Care Plan : General Pharmacy (Adult)  Updates made by Germaine Pomfret, RPH since 02/15/2021 12:00 AM     Problem: Hypertension, Hyperlipidemia, Diabetes, Osteoarthritis, and Allergic Rhinitis   Priority: High     Long-Range Goal: Patient-Specific Goal   Start Date: 07/13/2020  Expected End Date: 02/15/2022  This Visit's Progress: On track  Recent Progress: On track  Priority: High  Note:   Current Barriers:  Unable to independently monitor therapeutic efficacy  Pharmacist Clinical Goal(s):  Patient will maintain control of blood pressure as evidenced by BP less than 140/90  through collaboration with PharmD and provider.   Interventions: 1:1 collaboration with Steele Sizer, MD regarding development and update of comprehensive plan of care as evidenced by provider attestation and co-signature Inter-disciplinary care team collaboration (see longitudinal plan of care) Comprehensive medication review performed; medication list updated in electronic medical record  Hypertension (BP goal <140/90) -Controlled -Current treatment: Triamterene-HCTZ 37.5-25 mg daily: Appropriate, Effective, Safe, Accessible -Medications previously tried: NA  -Current home readings: Does not routinely monitor at home  -Current dietary habits: Monitors salt intake  -Current exercise habits: Walks a few times weekly -Denies hypotensive/hypertensive symptoms -Counseled to monitor BP at home weekly, document, and provide log at future appointments -Recommended to continue current medication  Hyperlipidemia: (LDL goal < 100) -Controlled -Current treatment: Rosuvastatin 5 mg three times weekly -Medications previously tried: NA  -Recommended to continue current medication  Diabetes (A1c goal <7%) -Controlled -Current medications: None -Medications previously tried: NA  -Recommended to continue current  medication  Osteoarthritis (Goal: Maintain pain relief) -Not ideally controlled -Current treatment  Acetaminophen 500 mg every 6 hours as needed: Appropriate, Effective, Safe, Accessible Baker's Best Maximum Strength Pain Relief Cream with 4% lidocaine: Appropriate, Effective, Safe, Accessible Meloxicam 7.5 mg daily as needed: Appropriate, Effective, Safe, Accessible  -Medications previously tried: NA  -Shots have been ineffective at reducing knee pain, patient is debating knee replacement surgery.  -Takes Meloxicam only ~1x monthly  -Continue current medications  Patient Goals/Self-Care Activities Patient will:  - check blood pressure weekly, document, and provide at future appointments  Follow Up Plan: Telephone follow up appointment with care management team member scheduled for:  08/16/2021 at 8:30 AM       Medication Assistance: None required.  Patient affirms current coverage meets needs.  Compliance/Adherence/Medication fill history: Care Gaps: Shingrix, Tdap, Covid Booster Urine Microalbumin   Star-Rating Drugs: Rosuvastatin 5 mg last filled on 06/28/2020 for 84 day supply at [facility]  Patient's preferred pharmacy is:  [facility], [place] - [address] AT [facility] [place] [address] Phone: [phone] Fax: [phone]   Uses pill box? Yes Pt endorses 100% compliance  We discussed: Current pharmacy is preferred with insurance plan and patient is satisfied with pharmacy services Patient decided to: Continue current medication management strategy  Care Plan and Follow Up Patient Decision:  Patient agrees to Care Plan and Follow-up.  Plan: Telephone follow up appointment with care management team member scheduled for:  08/16/2021 at 8:30 AM  Malva Limes, [place] Pharmacist Practitioner  [facility] [phone]

## 2021-02-21 ENCOUNTER — Telehealth: Payer: Self-pay | Admitting: Family Medicine

## 2021-02-21 NOTE — Telephone Encounter (Signed)
Pt would like you to give her a call.   Her cost of her medication rosuvastatin is  going to increase and she has some questions she needs to discuss with you.  [employer] is trying to get her to use mail order, but she is unsure. She is confused on what to do and would appreciate your help

## 2021-02-22 MED ORDER — ROSUVASTATIN CALCIUM 5 MG PO TABS: 5.0000 mg | ORAL_TABLET | ORAL | 1 refills | Status: DC

## 2021-02-22 NOTE — Telephone Encounter (Signed)
Spoke with patient who is requesting rosuvastatin be sent to [facility] as it will be cheaper for her there. She also had concerns about her blood pressure at her dentists office being elevated. Asked patient to monitor blood pressures at home and inform us if they remain elevated.   Stacy [name], [place] Pharmacist Practitioner  [facility] [phone]

## 2021-02-22 NOTE — Addendum Note (Signed)
Addended by: Daron Offer A on: 02/22/2021 09:54 AM   Modules accepted: Orders

## 2021-02-23 ENCOUNTER — Other Ambulatory Visit: Payer: Self-pay

## 2021-02-23 ENCOUNTER — Ambulatory Visit (INDEPENDENT_AMBULATORY_CARE_PROVIDER_SITE_OTHER): Payer: Medicare PPO | Admitting: Podiatry

## 2021-02-23 ENCOUNTER — Encounter: Payer: Self-pay | Admitting: Podiatry

## 2021-02-23 DIAGNOSIS — B351 Tinea unguium: Secondary | ICD-10-CM | POA: Diagnosis not present

## 2021-02-23 DIAGNOSIS — M79675 Pain in left toe(s): Secondary | ICD-10-CM

## 2021-02-23 DIAGNOSIS — M79674 Pain in right toe(s): Secondary | ICD-10-CM | POA: Diagnosis not present

## 2021-02-23 DIAGNOSIS — E119 Type 2 diabetes mellitus without complications: Secondary | ICD-10-CM

## 2021-02-23 NOTE — Progress Notes (Signed)
This patient returns to my office for at risk foot care.  This patient requires this care by a professional since this patient will be at risk due to having diabetes.  This patient is unable to cut nails herself since the patient cannot reach her nails.These nails are painful walking and wearing shoes.  This patient presents for at risk foot care today.  General Appearance  Alert, conversant and in no acute stress.  Vascular  Dorsalis pedis and posterior tibial  pulses are palpable  bilaterally.  Capillary return is within normal limits  bilaterally. Temperature is within normal limits  bilaterally.  Neurologic  Senn-Weinstein monofilament wire test within normal limits  bilaterally. Muscle power within normal limits bilaterally.  Nails Thick disfigured discolored nails with subungual debris  from hallux to fifth toes bilaterally. No evidence of bacterial infection or drainage bilaterally.  Orthopedic  No limitations of motion  feet .  No crepitus or effusions noted.  No bony pathology or digital deformities noted.  Skin  normotropic skin with no porokeratosis noted bilaterally.  No signs of infections or ulcers noted.     Onychomycosis  Pain in right toes  Pain in left toes  Consent was obtained for treatment procedures.   Mechanical debridement of nails 1-5  bilaterally performed with a nail nipper.  Filed with dremel without incident. No infection or ulcer.     Return office visit    10 weeks                 Told patient to return for periodic foot care and evaluation due to potential at risk complications.   Johnella Crumm DPM  

## 2021-03-09 ENCOUNTER — Ambulatory Visit (INDEPENDENT_AMBULATORY_CARE_PROVIDER_SITE_OTHER): Payer: Medicare PPO | Admitting: Vascular Surgery

## 2021-03-14 DIAGNOSIS — E785 Hyperlipidemia, unspecified: Secondary | ICD-10-CM

## 2021-03-14 DIAGNOSIS — E1169 Type 2 diabetes mellitus with other specified complication: Secondary | ICD-10-CM

## 2021-03-14 DIAGNOSIS — I10 Essential (primary) hypertension: Secondary | ICD-10-CM

## 2021-03-14 DIAGNOSIS — M1712 Unilateral primary osteoarthritis, left knee: Secondary | ICD-10-CM

## 2021-03-29 ENCOUNTER — Other Ambulatory Visit: Payer: Self-pay | Admitting: Orthopedic Surgery

## 2021-03-29 DIAGNOSIS — M25562 Pain in left knee: Secondary | ICD-10-CM | POA: Diagnosis not present

## 2021-03-29 DIAGNOSIS — M1712 Unilateral primary osteoarthritis, left knee: Secondary | ICD-10-CM

## 2021-03-29 DIAGNOSIS — E1169 Type 2 diabetes mellitus with other specified complication: Secondary | ICD-10-CM | POA: Diagnosis not present

## 2021-03-29 DIAGNOSIS — B351 Tinea unguium: Secondary | ICD-10-CM | POA: Diagnosis not present

## 2021-04-07 ENCOUNTER — Other Ambulatory Visit: Payer: Self-pay

## 2021-04-07 ENCOUNTER — Ambulatory Visit
Admission: RE | Admit: 2021-04-07 | Discharge: 2021-04-07 | Disposition: A | Discharge status: Home / Self Care | Payer: Medicare PPO | Source: Ambulatory Visit | Attending: Orthopedic Surgery | Admitting: Orthopedic Surgery

## 2021-04-07 DIAGNOSIS — M25462 Effusion, left knee: Secondary | ICD-10-CM | POA: Diagnosis not present

## 2021-04-07 DIAGNOSIS — M1712 Unilateral primary osteoarthritis, left knee: Secondary | ICD-10-CM | POA: Diagnosis not present

## 2021-04-07 DIAGNOSIS — I8392 Asymptomatic varicose veins of left lower extremity: Secondary | ICD-10-CM | POA: Diagnosis not present

## 2021-04-07 DIAGNOSIS — M2342 Loose body in knee, left knee: Secondary | ICD-10-CM | POA: Diagnosis not present

## 2021-05-04 ENCOUNTER — Other Ambulatory Visit: Payer: Self-pay | Admitting: Orthopedic Surgery

## 2021-05-11 ENCOUNTER — Encounter: Payer: Self-pay | Admitting: Podiatry

## 2021-05-11 ENCOUNTER — Ambulatory Visit (INDEPENDENT_AMBULATORY_CARE_PROVIDER_SITE_OTHER): Payer: Medicare PPO | Admitting: Podiatry

## 2021-05-11 DIAGNOSIS — M79674 Pain in right toe(s): Secondary | ICD-10-CM | POA: Diagnosis not present

## 2021-05-11 DIAGNOSIS — M79675 Pain in left toe(s): Secondary | ICD-10-CM

## 2021-05-11 DIAGNOSIS — E119 Type 2 diabetes mellitus without complications: Secondary | ICD-10-CM

## 2021-05-11 DIAGNOSIS — R600 Localized edema: Secondary | ICD-10-CM

## 2021-05-11 DIAGNOSIS — B351 Tinea unguium: Secondary | ICD-10-CM | POA: Diagnosis not present

## 2021-05-11 NOTE — Progress Notes (Signed)
This patient returns to my office for at risk foot care.  This patient requires this care by a professional since this patient will be at risk due to having diabetes.  This patient is unable to cut nails herself since the patient cannot reach her nails.These nails are painful walking and wearing shoes.  This patient presents for at risk foot care today.  General Appearance  Alert, conversant and in no acute stress.  Vascular  Dorsalis pedis and posterior tibial  pulses are palpable  bilaterally.  Capillary return is within normal limits  bilaterally. Temperature is within normal limits  bilaterally.  Neurologic  Senn-Weinstein monofilament wire test within normal limits  bilaterally. Muscle power within normal limits bilaterally.  Nails Thick disfigured discolored nails with subungual debris  from hallux to fifth toes bilaterally. No evidence of bacterial infection or drainage bilaterally.  Orthopedic  No limitations of motion  feet .  No crepitus or effusions noted.  No bony pathology or digital deformities noted.  Skin  normotropic skin with no porokeratosis noted bilaterally.  No signs of infections or ulcers noted.     Onychomycosis  Pain in right toes  Pain in left toes  Consent was obtained for treatment procedures.   Mechanical debridement of nails 1-5  bilaterally performed with a nail nipper.  Filed with dremel without incident. No infection or ulcer.     Return office visit    10 weeks                 Told patient to return for periodic foot care and evaluation due to potential at risk complications.   Eoin Willden DPM  

## 2021-05-25 DIAGNOSIS — M1712 Unilateral primary osteoarthritis, left knee: Secondary | ICD-10-CM | POA: Diagnosis not present

## 2021-05-29 ENCOUNTER — Inpatient Hospital Stay: Admission: RE | Admit: 2021-05-29 | Payer: Medicare PPO | Source: Ambulatory Visit

## 2021-05-30 ENCOUNTER — Encounter
Admission: RE | Admit: 2021-05-30 | Discharge: 2021-05-30 | Disposition: A | Discharge status: Home / Self Care | Payer: Medicare PPO | Source: Ambulatory Visit | Attending: Orthopedic Surgery | Admitting: Orthopedic Surgery

## 2021-05-30 DIAGNOSIS — Z01812 Encounter for preprocedural laboratory examination: Secondary | ICD-10-CM

## 2021-05-30 DIAGNOSIS — Z01818 Encounter for other preprocedural examination: Secondary | ICD-10-CM | POA: Insufficient documentation

## 2021-05-30 DIAGNOSIS — Z01810 Encounter for preprocedural cardiovascular examination: Secondary | ICD-10-CM | POA: Diagnosis not present

## 2021-05-30 HISTORY — DX: Prediabetes: R73.03

## 2021-05-30 LAB — CBC WITH DIFFERENTIAL/PLATELET
Abs Immature Granulocytes: 0.01 10*3/uL (ref 0.00–0.07)
Basophils Absolute: 0 10*3/uL (ref 0.0–0.1)
Basophils Relative: 1 %
Eosinophils Absolute: 0.1 10*3/uL (ref 0.0–0.5)
Eosinophils Relative: 4 %
HCT: 37.8 % (ref 36.0–46.0)
Hemoglobin: 11.7 g/dL — ABNORMAL LOW (ref 12.0–15.0)
Immature Granulocytes: 0 %
Lymphocytes Relative: 38 %
Lymphs Abs: 1.2 10*3/uL (ref 0.7–4.0)
MCH: 27.9 pg (ref 26.0–34.0)
MCHC: 31 g/dL (ref 30.0–36.0)
MCV: 90 fL (ref 80.0–100.0)
Monocytes Absolute: 0.3 10*3/uL (ref 0.1–1.0)
Monocytes Relative: 9 %
Neutro Abs: 1.5 10*3/uL — ABNORMAL LOW (ref 1.7–7.7)
Neutrophils Relative %: 48 %
Platelets: 223 10*3/uL (ref 150–400)
RBC: 4.2 MIL/uL (ref 3.87–5.11)
RDW: 14.1 % (ref 11.5–15.5)
WBC: 3.2 10*3/uL — ABNORMAL LOW (ref 4.0–10.5)
nRBC: 0 % (ref 0.0–0.2)

## 2021-05-30 LAB — COMPREHENSIVE METABOLIC PANEL
ALT: 19 U/L (ref 0–44)
AST: 24 U/L (ref 15–41)
Albumin: 3.8 g/dL (ref 3.5–5.0)
Alkaline Phosphatase: 49 U/L (ref 38–126)
Anion gap: 9 (ref 5–15)
BUN: 17 mg/dL (ref 8–23)
CO2: 30 mmol/L (ref 22–32)
Calcium: 9.3 mg/dL (ref 8.9–10.3)
Chloride: 103 mmol/L (ref 98–111)
Creatinine, Ser: 0.81 mg/dL (ref 0.44–1.00)
GFR, Estimated: >60 mL/min (ref >60)
Glucose, Bld: 78 mg/dL (ref 70–99)
Potassium: 3.7 mmol/L (ref 3.5–5.1)
Sodium: 142 mmol/L (ref 135–145)
Total Bilirubin: 0.7 mg/dL (ref 0.3–1.2)
Total Protein: 7.2 g/dL (ref 6.5–8.1)

## 2021-05-30 LAB — TYPE AND SCREEN
ABO/RH(D): B POS
Antibody Screen: NEGATIVE

## 2021-05-30 LAB — URINALYSIS, ROUTINE W REFLEX MICROSCOPIC
Bilirubin Urine: NEGATIVE
Glucose, UA: NEGATIVE mg/dL
Hgb urine dipstick: NEGATIVE
Ketones, ur: NEGATIVE mg/dL
Leukocytes,Ua: NEGATIVE
Nitrite: NEGATIVE
Protein, ur: NEGATIVE mg/dL
Specific Gravity, Urine: 1.016 (ref 1.005–1.030)
pH: 6 (ref 5.0–8.0)

## 2021-05-30 LAB — SURGICAL PCR SCREEN
MRSA, PCR: NEGATIVE
Staphylococcus aureus: NEGATIVE

## 2021-05-30 NOTE — Patient Instructions (Addendum)
Your procedure is scheduled on: Thursday, May 25 ?Report to the Registration Desk on the 1st floor of the [facility]. ?To find out your arrival time, please call [phone] between 1PM - 3PM on: Wednesday, May 24 ?If your arrival time is 6:00 am, do not arrive prior to that time as the [facility] entrance doors do not open until 6:00 am. ? ?REMEMBER: ?Instructions that are not followed completely may result in serious medical risk, up to and including death; or upon the discretion of your surgeon and anesthesiologist your surgery may need to be rescheduled. ? ?Do not eat food after midnight the night before surgery.  ?No gum chewing, lozengers or hard candies. ? ?You may however, drink CLEAR liquids up to 2 hours before you are scheduled to arrive for your surgery. Do not drink anything within 2 hours of your scheduled arrival time. ? ?Clear liquids include: ?- water  ?- apple juice without pulp ?- gatorade (not RED colors) ?- black coffee or tea (Do NOT add milk or creamers to the coffee or tea) ?Do NOT drink anything that is not on this list. ? ?In addition, your doctor has ordered for you to drink the provided  ?Ensure Pre-Surgery Clear Carbohydrate Drink  ?Drinking this carbohydrate drink up to two hours before surgery helps to reduce insulin resistance and improve patient outcomes. Please complete drinking 2 hours prior to scheduled arrival time. ? ?DO NOT TAKE ANY MEDICATIONS THE MORNING OF SURGERY  ? ?One week prior to surgery: Starting May 18 ?Stop aspirin and Anti-inflammatories (NSAIDS) such as Advil, Aleve, Ibuprofen, Motrin, Naproxen, Naprosyn and Aspirin based products such as Excedrin, Goodys Powder, BC Powder. ?Stop ANY OVER THE COUNTER supplements until after surgery. Stop vitamin D, cinnamon, fish oil, garlic. ?You may however, continue to take Tylenol if needed for pain up until the day of surgery. ? ?No Alcohol for 24 hours before or after surgery. ? ?No Smoking including e-cigarettes for  24 hours prior to surgery.  ?No chewable tobacco products for at least 6 hours prior to surgery.  ?No nicotine patches on the day of surgery. ? ?Do not use any "recreational" drugs for at least a week prior to your surgery.  ?Please be advised that the combination of cocaine and anesthesia may have negative outcomes, up to and including death. ?If you test positive for cocaine, your surgery will be cancelled. ? ?On the morning of surgery brush your teeth with toothpaste and water, you may rinse your mouth with mouthwash if you wish. ?Do not swallow any toothpaste or mouthwash. ? ?Use CHG Soap as directed on instruction sheet. ? ?Do not wear jewelry, make-up, hairpins, clips or nail polish. ? ?Do not wear lotions, powders, or perfumes.  ? ?Do not shave body from the neck down 48 hours prior to surgery just in case you cut yourself which could leave a site for infection.  ?Also, freshly shaved skin may become irritated if using the CHG soap. ? ?Contact lenses, hearing aids and dentures may not be worn into surgery. ? ?Do not bring valuables to the hospital. [facility] is not responsible for any missing/lost belongings or valuables.  ? ?Notify your doctor if there is any change in your medical condition (cold, fever, infection). ? ?Wear comfortable clothing (specific to your surgery type) to the hospital. ? ?After surgery, you can help prevent lung complications by doing breathing exercises.  ?Take deep breaths and cough every 1-2 hours. Your doctor may order a device called  an Chiropodist to help you take deep breaths. ? ?If you are being admitted to the hospital overnight, leave your suitcase in the car. ?After surgery it may be brought to your room. ? ?If you are being discharged the day of surgery, you will not be allowed to drive home. ?You will need a responsible adult (18 years or older) to drive you home and stay with you that night.  ? ?If you are taking public transportation, you will need to have a  responsible adult (18 years or older) with you. ?Please confirm with your physician that it is acceptable to use public transportation.  ? ?Please call the [place] Dept. at [phone] if you have any questions about these instructions. ? ?Surgery Visitation Policy: ? ?Patients undergoing a surgery or procedure may have two family members or support persons with them as long as the person is not COVID-19 positive or experiencing its symptoms.  ? ?Inpatient Visitation:   ? ?Visiting hours are 7 a.m. to 8 p.m. ?Up to four visitors are allowed at one time in a patient room, including children. The visitors may rotate out with other people during the day. One designated support person (adult) may remain overnight.  ?

## 2021-06-02 ENCOUNTER — Ambulatory Visit: Payer: Medicare PPO | Admitting: Family Medicine

## 2021-06-07 MED ORDER — CEFAZOLIN SODIUM-DEXTROSE 2-4 GM/100ML-% IV SOLN
2.0000 g | INTRAVENOUS | Status: AC
  Administered 2021-06-08 (×2): 2 g via INTRAVENOUS

## 2021-06-07 MED ORDER — ORAL CARE MOUTH RINSE: 15.0000 mL | Freq: Once | OROMUCOSAL | Status: AC

## 2021-06-07 MED ORDER — CHLORHEXIDINE GLUCONATE 0.12 % MT SOLN: 15.0000 mL | Freq: Once | OROMUCOSAL | Status: AC

## 2021-06-07 MED ORDER — FAMOTIDINE 20 MG PO TABS: 20.0000 mg | ORAL_TABLET | Freq: Once | ORAL | Status: AC

## 2021-06-07 MED ORDER — LACTATED RINGERS IV SOLN: INTRAVENOUS | Status: DC

## 2021-06-08 ENCOUNTER — Ambulatory Visit: Payer: Medicare PPO | Admitting: Urgent Care

## 2021-06-08 ENCOUNTER — Inpatient Hospital Stay
Admission: RE | Admit: 2021-06-08 | Discharge: 2021-06-09 | DRG: 470 | Disposition: A | Discharge status: Skilled Nursing Facility | Payer: Medicare PPO | Attending: Orthopedic Surgery | Admitting: Orthopedic Surgery

## 2021-06-08 ENCOUNTER — Encounter: Payer: Self-pay | Admitting: Orthopedic Surgery

## 2021-06-08 ENCOUNTER — Other Ambulatory Visit: Payer: Self-pay

## 2021-06-08 ENCOUNTER — Encounter
Admission: RE | Disposition: A | Discharge status: Skilled Nursing Facility | Payer: Self-pay | Source: Home / Self Care | Attending: Orthopedic Surgery

## 2021-06-08 ENCOUNTER — Ambulatory Visit: Payer: Medicare PPO | Admitting: Certified Registered Nurse Anesthetist

## 2021-06-08 ENCOUNTER — Observation Stay: Payer: Medicare PPO

## 2021-06-08 DIAGNOSIS — E559 Vitamin D deficiency, unspecified: Secondary | ICD-10-CM | POA: Diagnosis present

## 2021-06-08 DIAGNOSIS — Z823 Family history of stroke: Secondary | ICD-10-CM

## 2021-06-08 DIAGNOSIS — Z7901 Long term (current) use of anticoagulants: Secondary | ICD-10-CM | POA: Diagnosis not present

## 2021-06-08 DIAGNOSIS — Z8261 Family history of arthritis: Secondary | ICD-10-CM | POA: Diagnosis not present

## 2021-06-08 DIAGNOSIS — Z7401 Bed confinement status: Secondary | ICD-10-CM | POA: Diagnosis not present

## 2021-06-08 DIAGNOSIS — Z791 Long term (current) use of non-steroidal anti-inflammatories (NSAID): Secondary | ICD-10-CM | POA: Diagnosis not present

## 2021-06-08 DIAGNOSIS — J309 Allergic rhinitis, unspecified: Secondary | ICD-10-CM | POA: Diagnosis not present

## 2021-06-08 DIAGNOSIS — I10 Essential (primary) hypertension: Secondary | ICD-10-CM | POA: Diagnosis present

## 2021-06-08 DIAGNOSIS — Z471 Aftercare following joint replacement surgery: Secondary | ICD-10-CM | POA: Diagnosis not present

## 2021-06-08 DIAGNOSIS — Z79899 Other long term (current) drug therapy: Secondary | ICD-10-CM | POA: Diagnosis not present

## 2021-06-08 DIAGNOSIS — Z888 Allergy status to other drugs, medicaments and biological substances status: Secondary | ICD-10-CM | POA: Diagnosis not present

## 2021-06-08 DIAGNOSIS — E7800 Pure hypercholesterolemia, unspecified: Secondary | ICD-10-CM | POA: Diagnosis not present

## 2021-06-08 DIAGNOSIS — E119 Type 2 diabetes mellitus without complications: Secondary | ICD-10-CM | POA: Diagnosis present

## 2021-06-08 DIAGNOSIS — Z96652 Presence of left artificial knee joint: Secondary | ICD-10-CM | POA: Diagnosis not present

## 2021-06-08 DIAGNOSIS — M1712 Unilateral primary osteoarthritis, left knee: Secondary | ICD-10-CM | POA: Diagnosis present

## 2021-06-08 DIAGNOSIS — E785 Hyperlipidemia, unspecified: Secondary | ICD-10-CM | POA: Diagnosis present

## 2021-06-08 DIAGNOSIS — Z01812 Encounter for preprocedural laboratory examination: Principal | ICD-10-CM

## 2021-06-08 DIAGNOSIS — M62838 Other muscle spasm: Secondary | ICD-10-CM | POA: Diagnosis not present

## 2021-06-08 DIAGNOSIS — Z7982 Long term (current) use of aspirin: Secondary | ICD-10-CM

## 2021-06-08 HISTORY — PX: TOTAL KNEE ARTHROPLASTY: SHX125

## 2021-06-08 LAB — ABO/RH: ABO/RH(D): B POS

## 2021-06-08 LAB — CBC
HCT: 36.1 % (ref 36.0–46.0)
Hemoglobin: 11.2 g/dL — ABNORMAL LOW (ref 12.0–15.0)
MCH: 27.6 pg (ref 26.0–34.0)
MCHC: 31 g/dL (ref 30.0–36.0)
MCV: 88.9 fL (ref 80.0–100.0)
Platelets: 219 10*3/uL (ref 150–400)
RBC: 4.06 MIL/uL (ref 3.87–5.11)
RDW: 14 % (ref 11.5–15.5)
WBC: 5 10*3/uL (ref 4.0–10.5)
nRBC: 0 % (ref 0.0–0.2)

## 2021-06-08 LAB — CREATININE, SERUM
Creatinine, Ser: 0.79 mg/dL (ref 0.44–1.00)
GFR, Estimated: >60 mL/min (ref >60)

## 2021-06-08 SURGERY — ARTHROPLASTY, KNEE, TOTAL
Anesthesia: Spinal | Site: Knee | Laterality: Left

## 2021-06-08 MED ORDER — ACETAMINOPHEN 10 MG/ML IV SOLN: 1000.0000 mg | Freq: Once | INTRAVENOUS | Status: DC | PRN

## 2021-06-08 MED ORDER — ROSUVASTATIN CALCIUM 5 MG PO TABS
5.0000 mg | ORAL_TABLET | ORAL | Status: DC
  Administered 2021-06-09: 5 mg via ORAL
  Filled 2021-06-08: qty 1

## 2021-06-08 MED ORDER — PROPOFOL 10 MG/ML IV BOLUS
INTRAVENOUS | Status: AC
  Filled 2021-06-08: qty 20

## 2021-06-08 MED ORDER — SODIUM CHLORIDE FLUSH 0.9 % IV SOLN
INTRAVENOUS | Status: AC
  Filled 2021-06-08: qty 10

## 2021-06-08 MED ORDER — PHENYLEPHRINE HCL-NACL 20-0.9 MG/250ML-% IV SOLN
INTRAVENOUS | Status: DC | PRN
  Administered 2021-06-08: 40 ug/min via INTRAVENOUS

## 2021-06-08 MED ORDER — SODIUM CHLORIDE 0.9 % IR SOLN
Status: DC | PRN
  Administered 2021-06-08: 1004 mL

## 2021-06-08 MED ORDER — FAMOTIDINE 20 MG PO TABS
ORAL_TABLET | ORAL | Status: AC
  Administered 2021-06-08: 20 mg via ORAL
  Filled 2021-06-08: qty 1

## 2021-06-08 MED ORDER — FENTANYL CITRATE (PF) 100 MCG/2ML IJ SOLN: 25.0000 ug | INTRAMUSCULAR | Status: DC | PRN

## 2021-06-08 MED ORDER — PHENYLEPHRINE 80 MCG/ML (10ML) SYRINGE FOR IV PUSH (FOR BLOOD PRESSURE SUPPORT)
PREFILLED_SYRINGE | INTRAVENOUS | Status: DC | PRN
Start: 2021-06-08 — End: 2021-06-08
  Administered 2021-06-08 (×5): 80 ug via INTRAVENOUS

## 2021-06-08 MED ORDER — MORPHINE SULFATE (PF) 2 MG/ML IV SOLN: 0.5000 mg | INTRAVENOUS | Status: DC | PRN

## 2021-06-08 MED ORDER — SUGAMMADEX SODIUM 200 MG/2ML IV SOLN
INTRAVENOUS | Status: DC | PRN
  Administered 2021-06-08: 361.2 mg via INTRAVENOUS

## 2021-06-08 MED ORDER — PANTOPRAZOLE SODIUM 40 MG PO TBEC
40.0000 mg | DELAYED_RELEASE_TABLET | Freq: Every day | ORAL | Status: DC
  Administered 2021-06-08 – 2021-06-09 (×2): 40 mg via ORAL
  Filled 2021-06-08 (×2): qty 1

## 2021-06-08 MED ORDER — PROPOFOL 10 MG/ML IV BOLUS
INTRAVENOUS | Status: DC | PRN
Start: 2021-06-08 — End: 2021-06-08
  Administered 2021-06-08: 10 mg via INTRAVENOUS
  Administered 2021-06-08: 50 mg via INTRAVENOUS

## 2021-06-08 MED ORDER — ONDANSETRON HCL 4 MG/2ML IJ SOLN
INTRAMUSCULAR | Status: DC | PRN
  Administered 2021-06-08: 4 mg via INTRAVENOUS

## 2021-06-08 MED ORDER — ENOXAPARIN SODIUM 30 MG/0.3ML IJ SOSY
30.0000 mg | PREFILLED_SYRINGE | Freq: Two times a day (BID) | INTRAMUSCULAR | Status: DC
  Administered 2021-06-09: 30 mg via SUBCUTANEOUS
  Filled 2021-06-08: qty 0.3

## 2021-06-08 MED ORDER — CEFAZOLIN SODIUM-DEXTROSE 2-4 GM/100ML-% IV SOLN
INTRAVENOUS | Status: AC
  Filled 2021-06-08: qty 100

## 2021-06-08 MED ORDER — SODIUM CHLORIDE (PF) 0.9 % IJ SOLN
INTRAMUSCULAR | Status: DC | PRN
  Administered 2021-06-08: 91 mL via INTRAMUSCULAR

## 2021-06-08 MED ORDER — FENTANYL CITRATE (PF) 100 MCG/2ML IJ SOLN
INTRAMUSCULAR | Status: DC | PRN
  Administered 2021-06-08: 50 ug via INTRAVENOUS
  Administered 2021-06-08 (×2): 25 ug via INTRAVENOUS

## 2021-06-08 MED ORDER — ZOLPIDEM TARTRATE 5 MG PO TABS: 5.0000 mg | ORAL_TABLET | Freq: Every evening | ORAL | Status: DC | PRN

## 2021-06-08 MED ORDER — OLOPATADINE HCL 0.1 % OP SOLN: 1.0000 [drp] | Freq: Two times a day (BID) | OPHTHALMIC | Status: DC | PRN

## 2021-06-08 MED ORDER — VITAMIN D 25 MCG (1000 UNIT) PO TABS
2000.0000 [IU] | ORAL_TABLET | Freq: Every day | ORAL | Status: DC
  Administered 2021-06-08 – 2021-06-09 (×2): 2000 [IU] via ORAL
  Filled 2021-06-08 (×2): qty 2

## 2021-06-08 MED ORDER — DOCUSATE SODIUM 100 MG PO CAPS
100.0000 mg | ORAL_CAPSULE | Freq: Two times a day (BID) | ORAL | Status: DC
  Administered 2021-06-08 – 2021-06-09 (×3): 100 mg via ORAL
  Filled 2021-06-08 (×3): qty 1

## 2021-06-08 MED ORDER — FLUTICASONE PROPIONATE 50 MCG/ACT NA SUSP
2.0000 | Freq: Every day | NASAL | Status: DC | PRN
  Filled 2021-06-08: qty 16

## 2021-06-08 MED ORDER — PROPOFOL 1000 MG/100ML IV EMUL
INTRAVENOUS | Status: AC
  Filled 2021-06-08: qty 100

## 2021-06-08 MED ORDER — NEOMYCIN-POLYMYXIN B GU 40-200000 IR SOLN
Status: AC
  Filled 2021-06-08: qty 20

## 2021-06-08 MED ORDER — BUPIVACAINE-EPINEPHRINE (PF) 0.25% -1:200000 IJ SOLN
INTRAMUSCULAR | Status: AC
  Filled 2021-06-08: qty 30

## 2021-06-08 MED ORDER — PHENYLEPHRINE HCL (PRESSORS) 10 MG/ML IV SOLN
INTRAVENOUS | Status: AC
  Filled 2021-06-08: qty 1

## 2021-06-08 MED ORDER — METHOCARBAMOL 500 MG PO TABS
500.0000 mg | ORAL_TABLET | Freq: Four times a day (QID) | ORAL | Status: DC | PRN
  Administered 2021-06-08: 500 mg via ORAL
  Filled 2021-06-08: qty 1

## 2021-06-08 MED ORDER — METOCLOPRAMIDE HCL 5 MG/ML IJ SOLN: 5.0000 mg | Freq: Three times a day (TID) | INTRAMUSCULAR | Status: DC | PRN

## 2021-06-08 MED ORDER — LORATADINE 10 MG PO TABS
10.0000 mg | ORAL_TABLET | Freq: Every day | ORAL | Status: DC | PRN
  Administered 2021-06-08: 10 mg via ORAL
  Filled 2021-06-08: qty 1

## 2021-06-08 MED ORDER — ONDANSETRON HCL 4 MG/2ML IJ SOLN: 4.0000 mg | Freq: Once | INTRAMUSCULAR | Status: DC | PRN

## 2021-06-08 MED ORDER — ONDANSETRON HCL 4 MG/2ML IJ SOLN: 4.0000 mg | Freq: Four times a day (QID) | INTRAMUSCULAR | Status: DC | PRN

## 2021-06-08 MED ORDER — ASPIRIN 81 MG PO TBEC
81.0000 mg | DELAYED_RELEASE_TABLET | Freq: Every morning | ORAL | Status: DC
  Administered 2021-06-09: 81 mg via ORAL
  Filled 2021-06-08: qty 1

## 2021-06-08 MED ORDER — SENNOSIDES-DOCUSATE SODIUM 8.6-50 MG PO TABS
1.0000 | ORAL_TABLET | Freq: Every evening | ORAL | Status: DC | PRN
  Administered 2021-06-09: 1 via ORAL
  Filled 2021-06-08: qty 1

## 2021-06-08 MED ORDER — ALUM & MAG HYDROXIDE-SIMETH 200-200-20 MG/5ML PO SUSP: 30.0000 mL | ORAL | Status: DC | PRN

## 2021-06-08 MED ORDER — METHOCARBAMOL 1000 MG/10ML IJ SOLN
500.0000 mg | Freq: Four times a day (QID) | INTRAVENOUS | Status: DC | PRN
  Filled 2021-06-08: qty 5

## 2021-06-08 MED ORDER — ACETAMINOPHEN 10 MG/ML IV SOLN
INTRAVENOUS | Status: DC | PRN
Start: 2021-06-08 — End: 2021-06-08
  Administered 2021-06-08: 1000 mg via INTRAVENOUS

## 2021-06-08 MED ORDER — PROPOFOL 500 MG/50ML IV EMUL
INTRAVENOUS | Status: DC | PRN
Start: 2021-06-08 — End: 2021-06-08
  Administered 2021-06-08: 100 ug/kg/min via INTRAVENOUS

## 2021-06-08 MED ORDER — ONDANSETRON HCL 4 MG PO TABS: 4.0000 mg | ORAL_TABLET | Freq: Four times a day (QID) | ORAL | Status: DC | PRN

## 2021-06-08 MED ORDER — SODIUM CHLORIDE 0.9 % IV SOLN: INTRAVENOUS | Status: DC

## 2021-06-08 MED ORDER — DIPHENHYDRAMINE HCL 12.5 MG/5ML PO ELIX: 12.5000 mg | ORAL_SOLUTION | ORAL | Status: DC | PRN

## 2021-06-08 MED ORDER — CHLORHEXIDINE GLUCONATE 0.12 % MT SOLN
OROMUCOSAL | Status: AC
  Administered 2021-06-08: 15 mL via OROMUCOSAL
  Filled 2021-06-08: qty 15

## 2021-06-08 MED ORDER — FLEET ENEMA 7-19 GM/118ML RE ENEM
1.0000 | ENEMA | Freq: Once | RECTAL | Status: DC | PRN
Start: 2021-06-08 — End: 2021-06-09

## 2021-06-08 MED ORDER — SURGIPHOR WOUND IRRIGATION SYSTEM - OPTIME
TOPICAL | Status: DC | PRN
Start: 2021-06-08 — End: 2021-06-08
  Administered 2021-06-08: 450 mL

## 2021-06-08 MED ORDER — ACETAMINOPHEN 10 MG/ML IV SOLN
INTRAVENOUS | Status: AC
  Filled 2021-06-08: qty 100

## 2021-06-08 MED ORDER — PHENOL 1.4 % MT LIQD: 1.0000 | OROMUCOSAL | Status: DC | PRN

## 2021-06-08 MED ORDER — DEXAMETHASONE SODIUM PHOSPHATE 10 MG/ML IJ SOLN
INTRAMUSCULAR | Status: DC | PRN
  Administered 2021-06-08: 5 mg via INTRAVENOUS

## 2021-06-08 MED ORDER — ROCURONIUM 10MG/ML (10ML) SYRINGE FOR MEDFUSION PUMP - OPTIME
INTRAVENOUS | Status: DC | PRN
  Administered 2021-06-08: 50 mg via INTRAVENOUS

## 2021-06-08 MED ORDER — MAGNESIUM HYDROXIDE 400 MG/5ML PO SUSP
30.0000 mL | Freq: Every day | ORAL | Status: DC
  Administered 2021-06-08: 30 mL via ORAL
  Filled 2021-06-08: qty 30

## 2021-06-08 MED ORDER — METOCLOPRAMIDE HCL 5 MG PO TABS: 5.0000 mg | ORAL_TABLET | Freq: Three times a day (TID) | ORAL | Status: DC | PRN

## 2021-06-08 MED ORDER — SEVOFLURANE IN SOLN
RESPIRATORY_TRACT | Status: AC
  Filled 2021-06-08: qty 250

## 2021-06-08 MED ORDER — CEFAZOLIN SODIUM-DEXTROSE 1-4 GM/50ML-% IV SOLN
1.0000 g | Freq: Four times a day (QID) | INTRAVENOUS | Status: AC
  Administered 2021-06-08 (×2): 1 g via INTRAVENOUS
  Filled 2021-06-08 (×2): qty 50

## 2021-06-08 MED ORDER — SODIUM CHLORIDE FLUSH 0.9 % IV SOLN
INTRAVENOUS | Status: AC
  Filled 2021-06-08: qty 40

## 2021-06-08 MED ORDER — PHENYLEPHRINE HCL-NACL 20-0.9 MG/250ML-% IV SOLN
INTRAVENOUS | Status: AC
  Filled 2021-06-08: qty 250

## 2021-06-08 MED ORDER — BUPIVACAINE LIPOSOME 1.3 % IJ SUSP
INTRAMUSCULAR | Status: AC
  Filled 2021-06-08: qty 20

## 2021-06-08 MED ORDER — MENTHOL 3 MG MT LOZG: 1.0000 | LOZENGE | OROMUCOSAL | Status: DC | PRN

## 2021-06-08 MED ORDER — OXYCODONE HCL 5 MG PO TABS: 5.0000 mg | ORAL_TABLET | Freq: Once | ORAL | Status: DC | PRN

## 2021-06-08 MED ORDER — BISACODYL 5 MG PO TBEC
5.0000 mg | DELAYED_RELEASE_TABLET | Freq: Every day | ORAL | Status: DC | PRN
  Administered 2021-06-09: 5 mg via ORAL
  Filled 2021-06-08: qty 1

## 2021-06-08 MED ORDER — SODIUM CHLORIDE 0.9 % IR SOLN
Status: DC | PRN
  Administered 2021-06-08: 3001 mL

## 2021-06-08 MED ORDER — BUPIVACAINE HCL (PF) 0.5 % IJ SOLN
INTRAMUSCULAR | Status: DC | PRN
  Administered 2021-06-08: 2.5 mL

## 2021-06-08 MED ORDER — FENTANYL CITRATE (PF) 100 MCG/2ML IJ SOLN
INTRAMUSCULAR | Status: AC
  Filled 2021-06-08: qty 2

## 2021-06-08 MED ORDER — HYDROCODONE-ACETAMINOPHEN 5-325 MG PO TABS
1.0000 | ORAL_TABLET | ORAL | Status: DC | PRN
  Administered 2021-06-09: 1 via ORAL
  Filled 2021-06-08: qty 1

## 2021-06-08 MED ORDER — OXYCODONE HCL 5 MG/5ML PO SOLN: 5.0000 mg | Freq: Once | ORAL | Status: DC | PRN

## 2021-06-08 MED ORDER — TRAMADOL HCL 50 MG PO TABS
50.0000 mg | ORAL_TABLET | Freq: Four times a day (QID) | ORAL | Status: DC
  Administered 2021-06-08 – 2021-06-09 (×5): 50 mg via ORAL
  Filled 2021-06-08 (×5): qty 1

## 2021-06-08 MED ORDER — TRIAMTERENE-HCTZ 37.5-25 MG PO TABS
1.0000 | ORAL_TABLET | Freq: Every day | ORAL | Status: DC
  Administered 2021-06-08 – 2021-06-09 (×2): 1 via ORAL
  Filled 2021-06-08 (×2): qty 1

## 2021-06-08 MED ORDER — MORPHINE SULFATE (PF) 10 MG/ML IV SOLN
INTRAVENOUS | Status: AC
  Filled 2021-06-08: qty 1

## 2021-06-08 MED ORDER — ACETAMINOPHEN 325 MG PO TABS: 325.0000 mg | ORAL_TABLET | Freq: Four times a day (QID) | ORAL | Status: DC | PRN

## 2021-06-08 MED ORDER — HYDROCODONE-ACETAMINOPHEN 7.5-325 MG PO TABS
1.0000 | ORAL_TABLET | ORAL | Status: DC | PRN
  Administered 2021-06-08: 2 via ORAL
  Filled 2021-06-08 (×2): qty 2

## 2021-06-08 SURGICAL SUPPLY — 69 items
BLADE SAGITTAL 25.0X1.19X90 (BLADE) ×2 IMPLANT
BLADE SAW 90X13X1.19 OSCILLAT (BLADE) ×2 IMPLANT
BLOCK CUTTING FEMUR 3+ LT MED (MISCELLANEOUS) ×1 IMPLANT
BLOCK CUTTING TIBIAL 3 LT (MISCELLANEOUS) ×1 IMPLANT
BNDG ELASTIC 6X5.8 VLCR STR LF (GAUZE/BANDAGES/DRESSINGS) ×1 IMPLANT
CANISTER WOUND CARE 500ML ATS (WOUND CARE) ×2 IMPLANT
CEMENT FEMORAL COMP SZ3P LEFT (Femur) ×1 IMPLANT
CEMENT HV SMART SET (Cement) ×4 IMPLANT
CHLORAPREP W/TINT 26 (MISCELLANEOUS) ×4 IMPLANT
COOLER POLAR GLACIER W/PUMP (MISCELLANEOUS) ×2 IMPLANT
CUFF TOURN SGL QUICK 24 (TOURNIQUET CUFF)
CUFF TOURN SGL QUICK 34 (TOURNIQUET CUFF)
CUFF TRNQT CYL 24X4X16.5-23 (TOURNIQUET CUFF) IMPLANT
CUFF TRNQT CYL 34X4.125X (TOURNIQUET CUFF) IMPLANT
DRAPE 3/4 80X56 (DRAPES) ×4 IMPLANT
DRSG MEPILEX SACRM 8.7X9.8 (GAUZE/BANDAGES/DRESSINGS) ×2 IMPLANT
ELECT CAUTERY BLADE 6.4 (BLADE) ×2 IMPLANT
ELECT REM PT RETURN 9FT ADLT (ELECTROSURGICAL) ×2
ELECTRODE REM PT RTRN 9FT ADLT (ELECTROSURGICAL) ×1 IMPLANT
FEMUR BONE MODEL 4.9010 MEDACT (MISCELLANEOUS) ×1 IMPLANT
GAUZE XEROFORM 1X8 LF (GAUZE/BANDAGES/DRESSINGS) ×1 IMPLANT
GLOVE BIOGEL PI IND STRL 9 (GLOVE) ×1 IMPLANT
GLOVE BIOGEL PI INDICATOR 9 (GLOVE) ×1
GLOVE SURG ORTHO 8.0 STRL STRW (GLOVE) ×2 IMPLANT
GLOVE SURG SYN 9.0  PF PI (GLOVE) ×1
GLOVE SURG SYN 9.0 PF PI (GLOVE) ×1 IMPLANT
GLOVE SURG UNDER LTX SZ8 (GLOVE) ×2 IMPLANT
GOWN SRG 2XL LVL 4 RGLN SLV (GOWNS) ×1 IMPLANT
GOWN STRL NON-REIN 2XL LVL4 (GOWNS) ×1
GOWN STRL REUS W/ TWL LRG LVL3 (GOWN DISPOSABLE) ×1 IMPLANT
GOWN STRL REUS W/ TWL XL LVL3 (GOWN DISPOSABLE) ×1 IMPLANT
GOWN STRL REUS W/TWL LRG LVL3 (GOWN DISPOSABLE) ×1
GOWN STRL REUS W/TWL XL LVL3 (GOWN DISPOSABLE) ×1
HOLDER FOLEY CATH W/STRAP (MISCELLANEOUS) ×1 IMPLANT
HOOD PEEL AWAY FLYTE STAYCOOL (MISCELLANEOUS) ×4 IMPLANT
INSERT TIBIAL SZ3 LT 14 FLEX (Insert) ×1 IMPLANT
IV NS IRRIG 3000ML ARTHROMATIC (IV SOLUTION) ×2 IMPLANT
KIT PREVENA INCISION MGT20CM45 (CANNISTER) ×2 IMPLANT
KIT TURNOVER KIT A (KITS) ×2 IMPLANT
MANIFOLD NEPTUNE II (INSTRUMENTS) ×4 IMPLANT
NDL SAFETY ECLIPSE 18X1.5 (NEEDLE) ×1 IMPLANT
NDL SPNL 20GX3.5 QUINCKE YW (NEEDLE) ×1 IMPLANT
NEEDLE HYPO 18GX1.5 SHARP (NEEDLE) ×1
NEEDLE SPNL 20GX3.5 QUINCKE YW (NEEDLE) ×2 IMPLANT
NS IRRIG 1000ML POUR BTL (IV SOLUTION) ×2 IMPLANT
PACK TOTAL KNEE (MISCELLANEOUS) ×2 IMPLANT
PAD WRAPON POLAR KNEE (MISCELLANEOUS) ×1 IMPLANT
PATELLA RESURFACING MEDACTA 02 (Bone Implant) ×1 IMPLANT
PULSAVAC PLUS IRRIG FAN TIP (DISPOSABLE) ×2
SCALPEL PROTECTED #10 DISP (BLADE) ×4 IMPLANT
SOLUTION IRRIG SURGIPHOR (IV SOLUTION) ×2 IMPLANT
STAPLER SKIN PROX 35W (STAPLE) ×2 IMPLANT
STEM EXTENSION 11MMX30MM (Stem) ×1 IMPLANT
SUCTION FRAZIER HANDLE 10FR (MISCELLANEOUS) ×1
SUCTION TUBE FRAZIER 10FR DISP (MISCELLANEOUS) ×1 IMPLANT
SUT DVC 2 QUILL PDO  T11 36X36 (SUTURE) ×1
SUT DVC 2 QUILL PDO T11 36X36 (SUTURE) ×1 IMPLANT
SUT ETHIBOND 2 V 37 (SUTURE) ×1 IMPLANT
SUT V-LOC 90 ABS DVC 3-0 CL (SUTURE) ×2 IMPLANT
SYR 20ML LL LF (SYRINGE) ×2 IMPLANT
SYR 50ML LL SCALE MARK (SYRINGE) ×4 IMPLANT
TIBIAL BONE MODEL LEFT (MISCELLANEOUS) ×1 IMPLANT
TIBIAL TRAY FIXED MEDACTA 0207 (Bone Implant) ×1 IMPLANT
TIP FAN IRRIG PULSAVAC PLUS (DISPOSABLE) ×1 IMPLANT
TOWEL OR 17X26 4PK STRL BLUE (TOWEL DISPOSABLE) ×1 IMPLANT
TOWER CARTRIDGE SMART MIX (DISPOSABLE) ×2 IMPLANT
TRAY FOLEY MTR SLVR 16FR STAT (SET/KITS/TRAYS/PACK) ×2 IMPLANT
WATER STERILE IRR 1000ML POUR (IV SOLUTION) ×2 IMPLANT
WRAPON POLAR PAD KNEE (MISCELLANEOUS) ×2

## 2021-06-08 NOTE — TOC Progression Note (Signed)
Transition of Care [facility]) - Progression Note    Patient Details  Name: Stacy [name] MRN: [id number] Date of Birth: 29-Mar-1945  [facility] CM/SW [place], RN Phone Number: 06/08/2021, 1:35 PM  Clinical Narrative:     Patient is set up with [facility] for [facility] services, prior to admission by surgeons office       Expected Discharge Plan and Services                                                 Social Determinants of Health (SDOH) Interventions    Readmission Risk Interventions     View : No data to display.

## 2021-06-08 NOTE — Anesthesia Procedure Notes (Signed)
Procedure Name: Intubation Date/Time: 06/08/2021 10:20 AM Performed by: Willette Alma, CRNA Pre-anesthesia Checklist: Patient identified, Patient being monitored, Timeout performed, Emergency Drugs available and Suction available Patient Re-evaluated:Patient Re-evaluated prior to induction Oxygen Delivery Method: Circle system utilized Preoxygenation: Pre-oxygenation with 100% oxygen Induction Type: IV induction Ventilation: Mask ventilation without difficulty Laryngoscope Size: 3 and McGraph Grade View: Grade I Tube type: Oral Tube size: 6.5 mm Number of attempts: 1 Airway Equipment and Method: Stylet Placement Confirmation: ETT inserted through vocal cords under direct vision, positive ETCO2 and breath sounds checked- equal and bilateral Secured at: 20 cm Tube secured with: Tape Dental Injury: Teeth and Oropharynx as per pre-operative assessment  Comments: Atraumatic laryngoscopy and intubation. Lips and dentition remain unchanged from preoperative assessment.

## 2021-06-08 NOTE — Plan of Care (Signed)

## 2021-06-08 NOTE — Evaluation (Signed)
Physical Therapy Evaluation Patient Details Name: Stacy [name] MRN: [id number] DOB: Sep 02, 1945 Today's Date: 06/08/2021  History of Present Illness  Patient is a 76 year old female with Primary localized osteoarthritis of left knee s/p left total knee arthroplasty.  Clinical Impression  Patient is agreeable to PT evaluation. She reports she lives with her spouse and uses a cane occasionally for ambulation at baseline.  Today, therapeutic exercises and range of motion were initiated. Patient required minimal assistance for bed mobility and to stand from the bed. She stood for less than 2 minutes with no loss of balance with activity tolerance limited by knee pain with activity. Walking was not attempted this session. Recommend to continue PT to maximize independence and facilitate return to prior level of function. Recommend HHPT at discharge.      Recommendations for follow up therapy are one component of a multi-disciplinary discharge planning process, led by the attending physician.  Recommendations may be updated based on patient status, additional functional criteria and insurance authorization.  Follow Up Recommendations Home health PT    Assistance Recommended at Discharge Set up Supervision/Assistance  Patient can return home with the following  Help with stairs or ramp for entrance;Assist for transportation;A little help with walking and/or transfers;A little help with bathing/dressing/bathroom    Equipment Recommendations Rolling walker (2 wheels)  Recommendations for Other Services       Functional Status Assessment Patient has had a recent decline in their functional status and demonstrates the ability to make significant improvements in function in a reasonable and predictable amount of time.     Precautions / Restrictions Precautions Precautions: Knee Precaution Booklet Issued: Yes (comment) Restrictions Weight Bearing Restrictions: Yes LLE Weight Bearing: Weight  bearing as tolerated      Mobility  Bed Mobility Overal bed mobility: Needs Assistance Bed Mobility: Supine to Sit, Sit to Supine     Supine to sit: Modified independent (Device/Increase time) Sit to supine: Min assist   General bed mobility comments: intermittent assistance for LLE support. verbal cues for technique and sequencing    Transfers Overall transfer level: Needs assistance Equipment used: Rolling walker (2 wheels) Transfers: Sit to/from Stand Sit to Stand: Min assist           General transfer comment: lifting assistance required for standing. verbal cues for hand placement    Ambulation/Gait               General Gait Details: not attempted due to increased pain while standing  Stairs            Wheelchair Mobility    Modified Rankin (Stroke Patients Only)       Balance Overall balance assessment: Needs assistance Sitting-balance support: Feet supported Sitting balance-Leahy Scale: Good     Standing balance support: Bilateral upper extremity supported, Reliant on assistive device for balance Standing balance-Leahy Scale: Fair Standing balance comment: using rolling walker for support in standing                             Pertinent Vitals/Pain Pain Assessment Pain Assessment: 0-10 Pain Score: 5  Pain Location: anterior left knee Pain Descriptors / Indicators: Aching Pain Intervention(s): Limited activity within patient's tolerance, Premedicated before session    Home Living Family/patient expects to be discharged to:: Private residence Living Arrangements: Spouse/significant other Available Help at Discharge: Family Type of Home: House Home Access: Stairs to enter Entrance Stairs-Rails: Psychiatric nurse of  Steps: 4-5   Home Layout: One level Home Equipment: Cane - quad      Prior Function Prior Level of Function : Independent/Modified Independent             Mobility Comments:  independently uses cane inside but always has it with her in the community       Hand Dominance        Extremity/Trunk Assessment   Upper Extremity Assessment Upper Extremity Assessment: Generalized weakness    Lower Extremity Assessment Lower Extremity Assessment: RLE deficits/detail;LLE deficits/detail RLE Deficits / Details: WFL RLE Sensation: WNL LLE Deficits / Details: patient able to SLR independently. patient can activate hip/knee/ankle movement. LLE Sensation: WNL       Communication   Communication: No difficulties  Cognition Arousal/Alertness: Awake/alert Behavior During Therapy: WFL for tasks assessed/performed Overall Cognitive Status: Within Functional Limits for tasks assessed                                          General Comments      Exercises Total Joint Exercises Ankle Circles/Pumps: AROM, Strengthening, Left, 10 reps, Supine Quad Sets: AROM, Strengthening, Left, 10 reps, Supine Heel Slides: AAROM, Strengthening, Left (x 1 rep) Straight Leg Raises: AAROM, Strengthening, Left, 10 reps, Supine Goniometric ROM: left knee 10-80 degrees Other Exercises Other Exercises: verbal and visual cues for exercise technique   Assessment/Plan    PT Assessment Patient needs continued PT services  PT Problem List Decreased strength;Decreased activity tolerance;Decreased range of motion;Decreased balance;Decreased mobility;Decreased safety awareness;Pain       PT Treatment Interventions DME instruction;Gait training;Stair training;Functional mobility training;Therapeutic activities;Therapeutic exercise;Balance training;Neuromuscular re-education;Patient/family education    PT Goals (Current goals can be found in the Care Plan section)  Acute Rehab PT Goals Patient Stated Goal: to return home PT Goal Formulation: With patient Time For Goal Achievement: 06/22/21 Potential to Achieve Goals: Good    Frequency BID     Co-evaluation                AM-PAC PT "6 Clicks" Mobility  Outcome Measure Help needed turning from your back to your side while in a flat bed without using bedrails?: A Little Help needed moving from lying on your back to sitting on the side of a flat bed without using bedrails?: A Little Help needed moving to and from a bed to a chair (including a wheelchair)?: A Little Help needed standing up from a chair using your arms (e.g., wheelchair or bedside chair)?: A Little Help needed to walk in hospital room?: A Little Help needed climbing 3-5 steps with a railing? : A Little 6 Click Score: 18    End of Session   Activity Tolerance: Patient tolerated treatment well Patient left: in bed;with call bell/phone within reach;with bed alarm set;with SCD's reapplied (polar care re-applied, daughter at the bedside) Nurse Communication: Mobility status PT Visit Diagnosis: Pain;Difficulty in walking, not elsewhere classified (R26.2) Pain - Right/Left: Left Pain - part of body: Knee    Time: 1453-1530 PT Time Calculation (min) (ACUTE ONLY): 37 min   Charges:   PT Evaluation $PT Eval Low Complexity: 1 Low PT Treatments $Therapeutic Exercise: 8-22 mins        Minna Merritts, PT, MPT   Percell Locus 06/08/2021, 3:43 PM

## 2021-06-08 NOTE — H&P (Signed)
Chief Complaint  Patient presents with   Pre-op Exam  Scheduled for TKA 06/08/21 with Dr. Rudene Christians    History of the Present Illness: Stacy [name] is a 76 y.o. female here today for H&P for left total knee arthroplasty with Dr. Hessie Knows on 06/08/2021. She has noticed an increasing valgus deformity to the knee. She does not take meloxicam as often, and she has been taking Tylenol only as needed. She uses a pain cream, which helps. She gets stiffness in her left knee especially when she sits for a prolonged period of time and tries to get up. She received a cortisone injection in the past, which has not been helpful. Patient's pain is mostly along the lateral compartment, she has swelling and instability. Her pain is moderate to severe and interferes with quality of life and activities of daily living.  The patient was seen by a podiatrist, who told her she had rolling ankles. She underwent bilateral foot surgery years ago, and her left foot bothers her more. She denies any history of blood clots.  I have reviewed past medical, surgical, social and family history, and allergies as documented in the EMR.  Past Medical History: Past Medical History:  Diagnosis Date   Allergic rhinitis   Diabetes mellitus without complication (CMS-HCC)   Family history of breast cancer   Hyperlipidemia   Hypertension   Left Achilles tendinitis   Onychomycosis of multiple toenails with type 2 diabetes mellitus (CMS-HCC)   Varicose veins of both lower extremities   Vitamin D deficiency   Past Surgical History: Past Surgical History:  Procedure Laterality Date   BREAST EXCISIONAL BIOPSY Left 1968   foot surgery 2004   BREAST EXCISIONAL BIOPSY Right 2007   COLONOSCOPY 2011   BREAST EXCISIONAL BIOPSY Right 2015  3 stereo biopsies-2 benign, 1 radialscar-no excision done for radial scar   heel spur Left 12/15/2014   INCISIONAL BIOPSY BREAST Left 07/16/2017   BREAST EXCISIONAL BIOPSY Left 08/23/2020   BREAST  EXCISIONAL BIOPSY Left 10/07/2020  Dr Bary Castilla   bunionectomy   Past Family History: Family History  Problem Relation Age of Onset   Arthritis Mother   Breast cancer Sister 13   Kidney cancer Brother 89   Cancer Maternal Grandmother  gallbladder   Stroke Paternal Grandmother   Medications: Current Outpatient Medications Ordered in Epic  Medication Sig Dispense Refill   acetaminophen (TYLENOL) 500 MG tablet Take 1 tablet by mouth every 6 (six) hours as needed   aspirin 81 MG EC tablet Take 81 mg by mouth once daily   cholecalciferol (VITAMIN D3) 1000 unit tablet Take 1,000 Units by mouth once daily   cinnamon bark 500 mg capsule Take 2 capsules by mouth once daily   fish,flaxseed oil-e.prim-bcurr 400-400-200 mg Cap Take 1 capsule by mouth once daily   fluticasone propionate (FLONASE) 50 mcg/actuation nasal spray Place 2 sprays into both nostrils once daily as needed   ketoconazole (NIZORAL) 2 % cream Apply topically once daily   loratadine (CLARITIN) 10 mg tablet Take 10 mg by mouth once daily   meloxicam (MOBIC) 7.5 MG tablet Take 1 tablet (7.5 mg total) by mouth once daily 30 tablet 5   olopatadine (PATANOL) 0.1 % ophthalmic solution Place 1 drop into both eyes 2 (two) times daily   rosuvastatin (CRESTOR) 5 MG tablet Take 1 tablet by mouth 3 (three) times daily   tobramycin (TOBREX) 0.3 % ophthalmic solution Place 2 drops into both eyes every 6 (six) hours  triamterene-hydrochlorothiazide (MAXZIDE-25) 37.5-25 mg tablet Take 1 tablet by mouth once daily   No current Epic-ordered facility-administered medications on file.   Allergies: Allergies  Allergen Reactions   Altace [Ramipril] Angioedema    Body mass index is 30.92 kg/m.  Review of Systems: A comprehensive 14 point ROS was performed, reviewed, and the pertinent orthopaedic findings are documented in the HPI.  Vitals:  05/25/21 1448  BP: 122/80    General Physical Examination:  General:  Well developed, well  nourished, no apparent distress, normal affect, normal gait with no antalgic component.   HEENT: Head normocephalic, atraumatic, PERRL.   Abdomen: Soft, non tender, non distended, Bowel sounds present.  Heart: Examination of the heart reveals regular, rate, and rhythm. There is no murmur noted on ascultation. There is a normal apical pulse.  Lungs: Lungs are clear to auscultation. There is no wheeze, rhonchi, or crackles. There is normal expansion of bilateral chest walls.   Musculoskeletal Examination: On exam, left knee has range of motion of 15 to 95 degrees. Significant valgus deformity that is not passively correctable. 3+ edema to the leg with negative Homans' sign.  Radiographs: AP, lateral, standing, and sunrise x-rays of the left knee were reviewed today. These show valgus deformity with significant lateral compartment degenerative change, subchondral cyst formation, and subchondral sclerosis. There are osteophytes medially and laterally, more prominent on the lateral tibia and femur, posterior osteophytes on the femur and tibia laterally as well as significant erosion of the patellofemoral joint with inferior osteophytes. Sunrise view shows large osteophytes on the lateral aspect and medial aspect of the patella consistent with advanced knee patellofemoral arthritis. Comparing this to prior x-ray from 03/21/2020, there is worsening of valgus deformity, in particular as well as posterior osteophyte formation.  X-ray Impression Advanced tricompartmental osteoarthritis.  EXAM:  CT OF THE LEFT KNEE WITHOUT CONTRAST   TECHNIQUE:  Multidetector CT imaging of the left knee was performed according to  the standard protocol. Multiplanar CT image reconstructions were  also generated.   RADIATION DOSE REDUCTION: This exam was performed according to the  departmental dose-optimization program which includes automated  exposure control, adjustment of the mA and/or kV according to   patient size and/or use of iterative reconstruction technique.   COMPARISON:  None.   FINDINGS:  Bones/Joint/Cartilage   Left knee: No acute fracture. No dislocation. Moderate  tricompartmental osteoarthritis of the left knee as manifested by  joint space narrowing, subchondral sclerosis, and marginal  osteophyte formation. Spurring of the tibial spines. Small loose  bodies within the intercondylar notch. Small-moderate size knee  joint effusion.   Left hip: Limited axial bone images through the left hip demonstrate  no acute fracture or dislocation. Mild joint space narrowing.   Left ankle: Limited axial bone images through the left ankle  demonstrate no acute fracture or dislocation.   Ligaments   Suboptimally assessed by CT.   Muscles and Tendons   Musculotendinous structures about the knee appear within normal  limits by CT. Distal Achilles tendinosis.   Soft tissues   Lower extremity venous varicosities noted. Circumferential soft  tissue edema about the ankle. Multiple enlarged left inguinal lymph  nodes, largest measuring up to 1.8 cm short axis   IMPRESSION:  1. Moderate tricompartmental osteoarthritis of the left knee.  2. Multiple enlarged left inguinal lymph nodes, largest measuring up  to 1.8 cm short axis. Findings could be reactive, however a  lymphoproliferative process is not excluded.  3. Distal Achilles tendinosis.  4.  Circumferential soft tissue edema about the ankle.   Electronically Signed    By: Davina Poke D.O.    On: 04/10/2021 14:09  Assessment: ICD-10-CM  1. Primary osteoarthritis of left knee M17.12   Plan: 43. 76 year old female with advanced left knee tricompartmental osteoarthritis with severe pain interfering with quality of life and activities of daily living. No relief with conservative treatment. Risks, benefits, complications of a left total knee arthroplasty have been discussed with the patient patient has agreed to consent  the procedure with Dr. Hessie Knows on 06/08/2021.  Surgical Risks:  The nature of the condition and the proposed procedure has been reviewed in detail with the patient. Surgical versus non-surgical options and prognosis for recovery have been reviewed and the inherent risks and benefits of each have been discussed including the risks of infection, bleeding, injury to nerves/blood vessels/tendons, incomplete relief of symptoms, persisting pain and/or stiffness, loss of function, complex regional pain syndrome, failure of the procedure, as appropriate.   Electronically signed by Feliberto Gottron, [place] at 05/25/2021 3:18 PM EDT  Reviewed  H+P. No changes noted.

## 2021-06-08 NOTE — Progress Notes (Signed)
The patient lives at Home with her spouse The patient  currently has DME including a BSC The patient will need a rolling walker to be delivered by [employer] to the bedside They have transportation with her spouse They can afford their medication  They are set up with [facility] for Home health services   Admitted for: total knee surgery

## 2021-06-08 NOTE — Anesthesia Procedure Notes (Signed)
Spinal  Patient location during procedure: OR Start time: 06/08/2021 7:44 AM Reason for block: surgical anesthesia Staffing Performed: anesthesiologist  Anesthesiologist: Darrin Nipper, MD Preanesthetic Checklist Completed: patient identified, IV checked, site marked, risks and benefits discussed, surgical consent, monitors and equipment checked, pre-op evaluation and timeout performed Spinal Block Patient position: sitting Prep: Betadine Patient monitoring: heart rate, continuous pulse ox, blood pressure and cardiac monitor Approach: right paramedian Location: L4-5 Injection technique: single-shot Needle Needle type: Whitacre and Introducer  Needle gauge: 22 G Needle length: 9 cm Assessment Sensory level: T8 Events: CSF return Additional Notes Negative paresthesia. Negative blood return. Positive free-flowing CSF. Expiration date of kit checked and confirmed. Patient tolerated procedure well, without complications.

## 2021-06-08 NOTE — Transfer of Care (Signed)
Immediate Anesthesia Transfer of Care Note  Patient: Stacy [name]  Procedure(s) Performed: TOTAL KNEE ARTHROPLASTY (Left: Knee)  Patient Location: PACU  Anesthesia Type:General  Level of Consciousness: awake, alert  and oriented  Airway & Oxygen Therapy: Patient Spontanous Breathing and Patient connected to face mask oxygen  Post-op Assessment: Report given to RN and Post -op Vital signs reviewed and stable  Post vital signs: Reviewed and stable  Last Vitals:  Vitals Value Taken Time  BP 148/77 06/08/21 1206  Temp    Pulse 81 06/08/21 1210  Resp 16 06/08/21 1210  SpO2 99 % 06/08/21 1210  Vitals shown include unvalidated device data.  Last Pain:  Vitals:   06/08/21 0629  TempSrc: Temporal  PainSc: 0-No pain         Complications: No notable events documented.

## 2021-06-08 NOTE — Anesthesia Procedure Notes (Addendum)
Spinal  Patient location during procedure: OR Start time: 06/08/2021 7:35 AM End time: 06/08/2021 7:44 AM Reason for block: surgical anesthesia Staffing Performed: anesthesiologist  Anesthesiologist: Darrin Nipper, MD Preanesthetic Checklist Completed: patient identified, IV checked, site marked, risks and benefits discussed, surgical consent, monitors and equipment checked, pre-op evaluation and timeout performed Spinal Block Patient position: sitting Prep: ChloraPrep Patient monitoring: cardiac monitor, continuous pulse ox and blood pressure Approach: right paramedian Location: L3-4 Injection technique: single-shot Needle Needle type: Whitacre  Needle gauge: 22 G Needle length: 9 cm Assessment Sensory level: T4 Events: CSF return and second provider Additional Notes Very difficult placement 2/2 os and leftward scoliosis; three providers attempted.  Ultimately placed via right paramedian approach from what appeared to be midline from skin.

## 2021-06-08 NOTE — Op Note (Signed)
06/08/2021  12:02 PM  PATIENT:  Stacy [name]   MRN: [id number]  PRE-OPERATIVE DIAGNOSIS:  Primary localized osteoarthritis of left knee   POST-OPERATIVE DIAGNOSIS:  Same   PROCEDURE:  Procedure(s): Left TOTAL KNEE ARTHROPLASTY   SURGEON: Laurene Footman, MD   ASSISTANTS: Rachelle Hora, PA-C   ANESTHESIA:   spinal and general   EBL: 150 cc   BLOOD ADMINISTERED:none   DRAINS:  Incisional wound VAC     LOCAL MEDICATIONS USED:  MARCAINE    and OTHER Exparel and morphine   SPECIMEN:  No Specimen   DISPOSITION OF SPECIMEN:  N/A   COUNTS:  YES   TOURNIQUET: 23 minutes at 300 mm Hg   IMPLANTS: Medacta  GMK sphere system with 3+ left femur, 3 left tibia with short stem and 14 mm insert.  Size 2 patella, all components cemented.   DICTATION: Viviann Spare Dictation   patient was brought to the operating room and spinal anesthesia was obtained.  Unfortunately the instruments had contamination when an opening and had to be reprocessed several times so there is a delay in surgery such that the patient had a general anesthetic obtained as a spinal pry would have worn off prior to the finish of surgery.  After prepping and draping the left leg in sterile fashion, and after patient identification and timeout procedures were completed, a midline skin incision was made followed by medial parapatellar arthrotomy with mild medial compartment osteoarthritis, advanced patellofemoral arthritis and advanced lateral compartment arthritis, partial synovectomy was also carried out.   The ACL and PCL and fat pad were excised along with anterior horns of the meniscus. The proximal tibia cutting guide from  the [facility] system was applied and the proximal tibia cut carried out.  The distal femoral cut was carried out in a similar fashion     The 3+ femoral cutting guide applied with anterior posterior and chamfer cuts made.  The posterior horns of the menisci were removed at this point.   Injection of the above  medication was carried out after the femoral and tibial cuts were carried out.  The 3 baseplate trial was placed pinned into position and proximal tibial preparation carried out with drilling hand reaming and the keel punch followed by placement of the 3+ femur and sizing the tibial insert size 14 millimeter gave the best fit with stability and full extension.  The distal femoral drill holes were made in the notch cut for the trochlear groove was then carried out with trials were then removed the patella was cut using the patellar cutting guide and it sized to a size 2 after drill holes have been made it was at this time that the tourniquet was raised.  The knee was irrigated with pulsatile lavage and the bony surfaces dried the tibial component was cemented into place first.  Excess cement was removed and the polyethylene insert placed with a torque screw placed with a torque screwdriver tightened.  The distal femoral component was placed and the knee was held in extension as the patellar button was clamped into place.  After the cement was set, excess cement was removed and the knee was again irrigated thoroughly thoroughly irrigated.  The tourniquet was let down and hemostasis checked with electrocautery. The arthrotomy was repaired with a heavy Quill suture,  followed by 3-0 V lock subcuticular closure, skin staples followed by incisional wound VAC and Polar Care.Marland Kitchen   PLAN OF CARE: Admit for overnight observation   PATIENT DISPOSITION:  PACU - hemodynamically stable.

## 2021-06-08 NOTE — Anesthesia Preprocedure Evaluation (Signed)
Anesthesia Evaluation  Patient identified by MRN, date of birth, ID band Patient awake    Reviewed: Allergy & Precautions, NPO status , Patient's Chart, lab work & pertinent test results  History of Anesthesia Complications Negative for: history of anesthetic complications  Airway Mallampati: II  TM Distance: >3 FB Neck ROM: Full    Dental  (+) Missing, Poor Dentition, Partial Upper   Pulmonary neg pulmonary ROS, neg sleep apnea, neg COPD, Patient abstained from smoking.Not current smoker,    Pulmonary exam normal breath sounds clear to auscultation       Cardiovascular Exercise Tolerance: Good METShypertension, Pt. 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 No date: Pre-diabetes  Reproductive/Obstetrics                             Anesthesia Physical Anesthesia Plan  ASA: 2  Anesthesia Plan: Spinal   Post-op Pain Management: Ofirmev IV (intra-op)*   Induction: Intravenous  PONV Risk Score and Plan: 2 and Ondansetron, Dexamethasone, Propofol infusion, TIVA, Midazolam and Treatment may vary due to age or medical condition  Airway Management Planned: Natural Airway  Additional Equipment: None  Intra-op Plan:   Post-operative Plan:   Informed Consent: I have reviewed the patients History and Physical, chart, labs and discussed the procedure including the risks, benefits and alternatives  for the proposed anesthesia with the patient or authorized representative who has indicated his/her understanding and acceptance.       Plan Discussed with: CRNA and Surgeon  Anesthesia Plan Comments: (Discussed R/B/A of neuraxial anesthesia technique with patient: - rare risks of spinal/epidural hematoma, nerve damage, infection - Risk of PDPH - Risk of nausea and vomiting - Risk of conversion to general anesthesia and its associated risks, including sore throat, damage to lips/eyes/teeth/oropharynx, and rare risks such as cardiac and respiratory events. - Risk of allergic reactions  Discussed the role of CRNA in patient's perioperative care.  Patient voiced understanding.)        Anesthesia Quick Evaluation

## 2021-06-09 ENCOUNTER — Telehealth: Payer: Self-pay

## 2021-06-09 DIAGNOSIS — E559 Vitamin D deficiency, unspecified: Secondary | ICD-10-CM | POA: Diagnosis present

## 2021-06-09 DIAGNOSIS — Z7901 Long term (current) use of anticoagulants: Secondary | ICD-10-CM | POA: Diagnosis not present

## 2021-06-09 DIAGNOSIS — Z96653 Presence of artificial knee joint, bilateral: Secondary | ICD-10-CM | POA: Diagnosis not present

## 2021-06-09 DIAGNOSIS — Z7401 Bed confinement status: Secondary | ICD-10-CM | POA: Diagnosis not present

## 2021-06-09 DIAGNOSIS — J309 Allergic rhinitis, unspecified: Secondary | ICD-10-CM | POA: Diagnosis not present

## 2021-06-09 DIAGNOSIS — Z96652 Presence of left artificial knee joint: Secondary | ICD-10-CM | POA: Diagnosis not present

## 2021-06-09 DIAGNOSIS — M1712 Unilateral primary osteoarthritis, left knee: Secondary | ICD-10-CM | POA: Diagnosis present

## 2021-06-09 DIAGNOSIS — T8454XD Infection and inflammatory reaction due to internal left knee prosthesis, subsequent encounter: Secondary | ICD-10-CM | POA: Diagnosis not present

## 2021-06-09 DIAGNOSIS — Z471 Aftercare following joint replacement surgery: Secondary | ICD-10-CM | POA: Diagnosis not present

## 2021-06-09 DIAGNOSIS — Z823 Family history of stroke: Secondary | ICD-10-CM | POA: Diagnosis not present

## 2021-06-09 DIAGNOSIS — R600 Localized edema: Secondary | ICD-10-CM | POA: Diagnosis not present

## 2021-06-09 DIAGNOSIS — Z888 Allergy status to other drugs, medicaments and biological substances status: Secondary | ICD-10-CM | POA: Diagnosis not present

## 2021-06-09 DIAGNOSIS — Z7982 Long term (current) use of aspirin: Secondary | ICD-10-CM | POA: Diagnosis not present

## 2021-06-09 DIAGNOSIS — Z8261 Family history of arthritis: Secondary | ICD-10-CM | POA: Diagnosis not present

## 2021-06-09 DIAGNOSIS — M6281 Muscle weakness (generalized): Secondary | ICD-10-CM | POA: Diagnosis not present

## 2021-06-09 DIAGNOSIS — M62838 Other muscle spasm: Secondary | ICD-10-CM | POA: Diagnosis not present

## 2021-06-09 DIAGNOSIS — I10 Essential (primary) hypertension: Secondary | ICD-10-CM | POA: Diagnosis present

## 2021-06-09 DIAGNOSIS — Z791 Long term (current) use of non-steroidal anti-inflammatories (NSAID): Secondary | ICD-10-CM | POA: Diagnosis not present

## 2021-06-09 DIAGNOSIS — L03116 Cellulitis of left lower limb: Secondary | ICD-10-CM | POA: Diagnosis not present

## 2021-06-09 DIAGNOSIS — E7800 Pure hypercholesterolemia, unspecified: Secondary | ICD-10-CM | POA: Diagnosis not present

## 2021-06-09 DIAGNOSIS — R799 Abnormal finding of blood chemistry, unspecified: Secondary | ICD-10-CM | POA: Diagnosis not present

## 2021-06-09 DIAGNOSIS — E782 Mixed hyperlipidemia: Secondary | ICD-10-CM | POA: Diagnosis not present

## 2021-06-09 DIAGNOSIS — Z79899 Other long term (current) drug therapy: Secondary | ICD-10-CM | POA: Diagnosis not present

## 2021-06-09 DIAGNOSIS — E119 Type 2 diabetes mellitus without complications: Secondary | ICD-10-CM | POA: Diagnosis present

## 2021-06-09 DIAGNOSIS — E785 Hyperlipidemia, unspecified: Secondary | ICD-10-CM | POA: Diagnosis present

## 2021-06-09 MED ORDER — DOCUSATE SODIUM 100 MG PO CAPS: 100.0000 mg | ORAL_CAPSULE | Freq: Two times a day (BID) | ORAL | 0 refills | Status: DC

## 2021-06-09 MED ORDER — TRAMADOL HCL 50 MG PO TABS: 50.0000 mg | ORAL_TABLET | Freq: Four times a day (QID) | ORAL | 0 refills | Status: DC

## 2021-06-09 MED ORDER — METHOCARBAMOL 500 MG PO TABS: 500.0000 mg | ORAL_TABLET | Freq: Four times a day (QID) | ORAL | 0 refills | Status: DC | PRN

## 2021-06-09 MED ORDER — ENOXAPARIN SODIUM 40 MG/0.4ML IJ SOSY: 40.0000 mg | PREFILLED_SYRINGE | INTRAMUSCULAR | 0 refills | Status: DC

## 2021-06-09 MED ORDER — HYDROCODONE-ACETAMINOPHEN 5-325 MG PO TABS: 1.0000 | ORAL_TABLET | ORAL | 0 refills | Status: DC | PRN

## 2021-06-09 NOTE — Discharge Summary (Signed)
Physician Discharge Summary  Patient ID: Stacy [name] MRN: [id number] DOB/AGE: April 02, 1945 76 y.o.  Admit date: 06/08/2021 Discharge date: 06/09/2021  Admission Diagnoses:  S/P TKR (total knee replacement) using cement, left [Z96.652]   Discharge Diagnoses: Patient Active Problem List   Diagnosis Date Noted   S/P TKR (total knee replacement) using cement, left 06/08/2021   Hyperlipidemia associated with type 2 diabetes mellitus ([place]) 01/14/2020   Onychomycosis of multiple toenails with type 2 diabetes mellitus ([place]) 01/23/2019   Pain due to onychomycosis of toenails of both feet 10/06/2018   Leg edema 12/11/2016   Abnormal EKG 12/10/2016   Radial scar of breast 05/30/2015   History of foot surgery 12/15/2014   Hypertension, benign 10/07/2014   Seasonal allergies 10/07/2014   Diabetes mellitus type 2, diet-controlled ([place]) 10/07/2014   Vitamin D deficiency 10/07/2014   Varicose veins 10/07/2014   Left Achilles tendinitis 10/07/2014   Family history of breast cancer 05/12/2012    Past Medical History:  Diagnosis Date   Allergy    Arthritis    Hyperlipidemia    Hypertension    Neoplasm of uncertain behavior of breast 2007   Pre-diabetes      Transfusion: none   Consultants (if any):   Discharged Condition: Improved  Hospital Course: Stacy [name] is an 76 y.o. female who was admitted 06/08/2021 with a diagnosis of S/P TKR (total knee replacement) using cement, left and went to the operating room on 06/08/2021 and underwent the above named procedures.    Surgeries: Procedure(s): TOTAL KNEE ARTHROPLASTY on 06/08/2021 Patient tolerated the surgery well. Taken to PACU where she was stabilized and then transferred to the orthopedic floor.  Started on Lovenox 30 mg q 12 hrs. Foot pumps applied bilaterally at 80 mm. Heels elevated on bed with rolled towels. No evidence of DVT. Negative [place]. Physical therapy started on day #1 for gait training and transfer. OT started  day #1 for ADL and assisted devices.  Patient's foley was d/c on day #1. Patient stable and ready for discharge to SNF on post op day 1    She was given perioperative antibiotics:  Anti-infectives (From admission, onward)    Start     Dose/Rate Route Frequency Ordered Stop   06/08/21 1630  ceFAZolin (ANCEF) IVPB 1 g/50 mL premix        1 g 100 mL/hr over 30 Minutes Intravenous Every 6 hours 06/08/21 1318 06/08/21 2227   06/08/21 0622  ceFAZolin (ANCEF) 2-4 GM/100ML-% IVPB       Note to Pharmacy: Josephina Shih A: cabinet override      06/08/21 0622 06/08/21 0804   06/08/21 0600  ceFAZolin (ANCEF) IVPB 2g/100 mL premix        2 g 200 mL/hr over 30 Minutes Intravenous On call to O.R. 06/07/21 2329 06/08/21 1032     .  She was given sequential compression devices, early ambulation, and Lovneox TEDs for DVT prophylaxis.  She benefited maximally from the hospital stay and there were no complications.    Recent vital signs:  Vitals:   06/09/21 0350 06/09/21 0735  BP: 136/65 (!) 138/59  Pulse: 93 91  Resp: 20 18  Temp: 98.7 F (37.1 C) 99.4 F (37.4 C)  SpO2: 93% 97%    Recent laboratory studies:  Lab Results  Component Value Date   HGB 11.2 (L) 06/08/2021   HGB 11.7 (L) 05/30/2021   HGB 12.3 07/26/2020   Lab Results  Component Value Date   WBC  5.0 06/08/2021   PLT 219 06/08/2021   No results found for: INR Lab Results  Component Value Date   NA 142 05/30/2021   K 3.7 05/30/2021   CL 103 05/30/2021   CO2 30 05/30/2021   BUN 17 05/30/2021   CREATININE 0.79 06/08/2021   GLUCOSE 78 05/30/2021    Discharge Medications:   Allergies as of 06/09/2021       Reactions   Altace [ramipril] Swelling, Other (See Comments)   lips        Medication List     TAKE these medications    acetaminophen 500 MG tablet Commonly known as: TYLENOL Take 500-1,000 mg by mouth every 6 (six) hours as needed (pain.).   aspirin EC 81 MG tablet Take 81 mg by mouth in the  morning.   CINNAMON PO Take 1,000 mg by mouth in the morning.   docusate sodium 100 MG capsule Commonly known as: COLACE Take 1 capsule (100 mg total) by mouth 2 (two) times daily.   enoxaparin 40 MG/0.4ML injection Commonly known as: LOVENOX Inject 0.4 mLs (40 mg total) into the skin daily for 14 days.   FISH OIL-BORAGE-FLAX-SAFFLOWER PO Take 1 capsule by mouth in the morning. Notes to patient: Not given in hospital   fluticasone 50 MCG/ACT nasal spray Commonly known as: FLONASE Place 2 sprays into both nostrils daily. What changed:  when to take this reasons to take this Notes to patient: Not given in hospital   GARLIQUE PO Take 1 capsule by mouth every other day.   HYDROcodone-acetaminophen 5-325 MG tablet Commonly known as: NORCO/VICODIN Take 1-2 tablets by mouth every 4 (four) hours as needed for moderate pain (pain score 4-6).   LIDOCAINE-MENTHOL EX Apply 1 application. topically 3 (three) times daily as needed (osteoarthritis pain.). Baker's Best Maximum Strength Topical Pain Relief Cream (4 % Lidocaine/Menthol) Notes to patient: Not given in hospital   loratadine 10 MG tablet Commonly known as: CLARITIN Take 1 tablet (10 mg total) by mouth daily. What changed:  when to take this reasons to take this   methocarbamol 500 MG tablet Commonly known as: ROBAXIN Take 1 tablet (500 mg total) by mouth every 6 (six) hours as needed for muscle spasms.   olopatadine 0.1 % ophthalmic solution Commonly known as: PATANOL Place 1 drop into both eyes 2 (two) times daily as needed for allergies. Notes to patient: Not given in hospital   rosuvastatin 5 MG tablet Commonly known as: CRESTOR Take 1 tablet (5 mg total) by mouth 3 (three) times a week. What changed: when to take this   traMADol 50 MG tablet Commonly known as: ULTRAM Take 1 tablet (50 mg total) by mouth every 6 (six) hours.   triamterene-hydrochlorothiazide 37.5-25 MG tablet Commonly known as:  MAXZIDE-25 Take 1 tablet by mouth daily.   VITAMIN D-3 PO Take 2,000 Units by mouth in the morning.               Durable Medical Equipment  (From admission, onward)           Start     Ordered   06/08/21 1547  For home use only DME Walker rolling  Once       Question Answer Comment  Walker: With 5 Inch Wheels   Patient needs a walker to treat with the following condition Difficulty walking      06/08/21 1546   06/08/21 1319  DME Walker rolling  Once       Question  Answer Comment  Walker: With [place]   Patient needs a walker to treat with the following condition S/P TKR (total knee replacement) using cement, left      06/08/21 1318   06/08/21 1319  DME 3 n 1  Once        06/08/21 1318   06/08/21 1319  DME Bedside commode  Once       Question:  Patient needs a bedside commode to treat with the following condition  Answer:  S/P TKR (total knee replacement) using cement, left   06/08/21 1318            Diagnostic Studies: DG Knee 1-2 Views Left  Result Date: 06/08/2021 CLINICAL DATA:  Status post left knee replacement EXAM: LEFT KNEE - 2 VIEW COMPARISON:  None Available. FINDINGS: Interval postsurgical changes from left total knee arthroplasty. Arthroplasty components appear in their expected alignment. No periprosthetic fracture is identified. Expected postoperative changes within the overlying soft tissues. IMPRESSION: Expected postsurgical changes of left total knee arthroplasty Electronically Signed   By: Yetta Glassman M.D.   On: 06/08/2021 12:43    Disposition:      Contact information for follow-up providers     Duanne Guess, PA-C Follow up in 2 week(s).   Specialties: Orthopedic Surgery, Emergency Medicine Why: 06/23/21 @ 10:15 am Contact information: [place] [address] [phone]         Steele Sizer, MD Follow up on 06/09/2021.   Specialty: Family Medicine Why: Called doctors office for follow-up  appointment, there was no appointment available within two weeks. Nurse will contact patient when appointment is available. Contact information: [address] [place] [address] [phone]              Contact information for after-discharge care     Destination     [facility] .   Service: Skilled Nursing Contact information: [place] [phone]                      Signed: Feliberto Gottron 06/09/2021, 12:32 PM

## 2021-06-09 NOTE — TOC Progression Note (Signed)
Transition of Care [facility]) - Progression Note    Patient Details  Name: Stacy [name] MRN: [id number] Date of Birth: 23-Sep-1945  Transition of Care [facility]) CM/SW [place], RN Phone Number: 06/09/2021, 10:59 AM  Clinical Narrative:    Met with the patient and her daughter in the room She is agreeable to go to [facility] SNF if [place] has a bed, if they do not then she will go home with her daughter and the family will assist at home and she will have [place] with Alvis Lemmings I reached out to [facility] and asked to review for a bed offer, awaiting a response   Expected Discharge Plan: [place] Barriers to Discharge: Continued Medical Work up  Expected Discharge Plan and Services Expected Discharge Plan: [place]   Discharge Planning Services: CM Consult   Living arrangements for the past 2 months: Single Family Home                 DME Arranged: Gilford Rile rolling DME Agency: [employer] Date DME Agency Contacted: 06/08/21 Time DME Agency Contacted: [phone] Representative spoke with at DME Agency: [place]: PT [place]: [place] Date [place]: 06/08/21 Time [place]: [phone] Representative spoke with at [place]: [place] ([place]) Interventions    Readmission Risk Interventions     View : No data to display.

## 2021-06-09 NOTE — Anesthesia Postprocedure Evaluation (Signed)
Anesthesia Post Note  Patient: Stacy [name]  Procedure(s) Performed: TOTAL KNEE ARTHROPLASTY (Left: Knee)  Patient location during evaluation: Nursing Unit Anesthesia Type: Spinal Level of consciousness: awake Pain management: pain level controlled Respiratory status: spontaneous breathing Cardiovascular status: stable Postop Assessment: no headache Anesthetic complications: no   No notable events documented.   Last Vitals:  Vitals:   06/08/21 2319 06/09/21 0350  BP: (!) 125/58 136/65  Pulse: 79 93  Resp: 18 20  Temp: 37.2 C 37.1 C  SpO2: 95% 93%    Last Pain:  Vitals:   06/08/21 1957  TempSrc:   PainSc: [place]

## 2021-06-09 NOTE — TOC Progression Note (Signed)
Transition of Care [facility]) - Progression Note    Patient Details  Name: Stacy [name] MRN: [id number] Date of Birth: November 16, 1945  Transition of Care [facility]) CM/SW [place], RN Phone Number: 06/09/2021, 12:03 PM  Clinical Narrative:     Patient offered a bed at [facility], [place] approved ref number [id number], called Tiffany at [place]  Expected Discharge Plan: [place] Barriers to Discharge: Continued Medical Work up  Expected Discharge Plan and Services Expected Discharge Plan: [place]   Discharge Planning Services: CM Consult   Living arrangements for the past 2 months: Single Family Home                 DME Arranged: Gilford Rile rolling DME Agency: [employer] Date DME Agency Contacted: 06/08/21 Time DME Agency Contacted: [phone] Representative spoke with at DME Agency: [place]: PT [place]: [place] Date [place]: 06/08/21 Time [place]: [phone] Representative spoke with at [place]: Tommi Rumps   Social Determinants of Health ([place]) Interventions    Readmission Risk Interventions     View : No data to display.

## 2021-06-09 NOTE — Discharge Instructions (Signed)

## 2021-06-09 NOTE — Telephone Encounter (Signed)
Tried to call pt back and left a VM for the pt to call back to schedule and pt may be scheduled with Dr Ky Barban due to Dr Ancil Boozer being out of the office for the next 2 weeks.    Copied from [place] [phone]. Topic: Appointment Scheduling - Scheduling Inquiry for Clinic >> Jun 09, 2021 11:37 AM Oneta Rack wrote: Stacy [name] would like to schedule a hospital follow up with PCP, PCP has no available appointments within 14 days. Patient will be discharged on 06/09/2021 from [facility]. Please call the patient regarding scheduling a hospital follow up

## 2021-06-09 NOTE — TOC Progression Note (Signed)
Transition of Care [facility]) - Progression Note    Patient Details  Name: Stacy [name] MRN: [id number] Date of Birth: 1945-05-06  Transition of Care [facility]) CM/SW [place], RN Phone Number: 06/09/2021, 1:52 PM  Clinical Narrative:    The patient is going to room 503 at [facility], Family in  the room and aware, EMS called and is 2nd on list   Expected Discharge Plan: [place] Barriers to Discharge: Continued Medical Work up  Expected Discharge Plan and Services Expected Discharge Plan: [place]   Discharge Planning Services: CM Consult   Living arrangements for the past 2 months: Single Family Home Expected Discharge Date: 06/09/21               DME Arranged: Gilford Rile rolling DME Agency: [employer] Date DME Agency Contacted: 06/08/21 Time DME Agency Contacted: [phone] Representative spoke with at DME Agency: [place]: PT [place]: [place] Date [place]: 06/08/21 Time [place]: [phone] Representative spoke with at [place]: [place] Determinants of Health ([place]) Interventions    Readmission Risk Interventions     View : No data to display.

## 2021-06-09 NOTE — Evaluation (Signed)
Occupational Therapy Evaluation Patient Details Name: Stacy [name] MRN: [id number] DOB: 04/08/1945 Today's Date: 06/09/2021   History of Present Illness Patient is a 76 year old female with Primary localized osteoarthritis of left knee s/p left total knee arthroplasty.   Clinical Impression   Pt was seen for OT evaluation this date. Prior to hospital admission, pt was using a QC for mobility and independent with ADLs. Pt lives in one level house with spouse. Pt presents to acute OT demonstrating impaired ADL performance and functional mobility 2/2 decreased activity tolerance and functional strength/ROM/balance deficits. Pt currently requires MOD A + RW with mod vcs for technique for BSC t/f; MIN A + RW for clothing management and pericare in standing; and MOD A for LB access at bed level. Pt would benefit from skilled OT to address noted impairments and functional limitations (see below for any additional details). Upon hospital discharge, recommend STR to maximize pt safety and return to PLOF.      Recommendations for follow up therapy are one component of a multi-disciplinary discharge planning process, led by the attending physician.  Recommendations may be updated based on patient status, additional functional criteria and insurance authorization.   Follow Up Recommendations  Skilled nursing-short term rehab (<3 hours/day)    Assistance Recommended at Discharge Frequent or constant Supervision/Assistance  Patient can return home with the following A lot of help with walking and/or transfers;A lot of help with bathing/dressing/bathroom;Assistance with cooking/housework;Assist for transportation    Functional Status Assessment  Patient has had a recent decline in their functional status and demonstrates the ability to make significant improvements in function in a reasonable and predictable amount of time.  Equipment Recommendations  BSC/3in1    Recommendations for Other Services        Precautions / Restrictions Precautions Precautions: Knee Restrictions Weight Bearing Restrictions: Yes LLE Weight Bearing: Weight bearing as tolerated      Mobility Bed Mobility Overal bed mobility: Needs Assistance Bed Mobility: Supine to Sit, Sit to Supine     Supine to sit: Min assist Sit to supine: Min assist   General bed mobility comments: Significantly increased time and effort with min A to manage the LLE    Transfers Overall transfer level: Needs assistance Equipment used: Rolling walker (2 wheels) Transfers: Sit to/from Stand, Bed to chair/wheelchair/BSC Sit to Stand: Mod assist           General transfer comment: Pt required MOD A for STS and MOD vcs for RW management/safe hand placement during BSC t/f.      Balance Overall balance assessment: Needs assistance Sitting-balance support: Feet supported Sitting balance-Leahy Scale: Good     Standing balance support: Bilateral upper extremity supported, Reliant on assistive device for balance, During functional activity Standing balance-Leahy Scale: Fair                             ADL either performed or assessed with clinical judgement   ADL Overall ADL's : Needs assistance/impaired                         Toilet Transfer: Cueing for safety;Cueing for sequencing;BSC/3in1;Rolling walker (2 wheels);Moderate assistance             General ADL Comments: MOD A + RW with mod vcs for technique for BSC t/f. MIN A + RW for clothing management and pericare in standing. MOD A for LB access at  bed level.      Pertinent Vitals/Pain Pain Assessment Pain Assessment: 0-10 Pain Score: 8  Pain Location: left knee Pain Descriptors / Indicators: Grimacing, Discomfort Pain Intervention(s): Repositioned, Limited activity within patient's tolerance, Ice applied        Extremity/Trunk Assessment Upper Extremity Assessment Upper Extremity Assessment: Overall WFL for tasks assessed    Lower Extremity Assessment Lower Extremity Assessment: Generalized weakness       Communication Communication Communication: No difficulties   Cognition Arousal/Alertness: Awake/alert Behavior During Therapy: WFL for tasks assessed/performed Overall Cognitive Status: Within Functional Limits for tasks assessed                                                  Home Living Family/patient expects to be discharged to:: Private residence Living Arrangements: Spouse/significant other Available Help at Discharge: Family Type of Home: House Home Access: Stairs to enter Technical brewer of Steps: 4-5 Entrance Stairs-Rails: Right;Left Home Layout: One level     Bathroom Shower/Tub: Tub/shower unit;Walk-in shower         Home Equipment: Cane - quad          Prior Functioning/Environment Prior Level of Function : Independent/Modified Independent                        OT Problem List: Decreased strength;Decreased range of motion;Decreased activity tolerance;Decreased safety awareness      OT Treatment/Interventions: Self-care/ADL training;DME and/or AE instruction;Patient/family education;Therapeutic activities    OT Goals(Current goals can be found in the care plan section) Acute Rehab OT Goals Patient Stated Goal: To return home OT Goal Formulation: With patient Time For Goal Achievement: 06/23/21 Potential to Achieve Goals: Good ADL Goals Pt Will Perform Grooming: with modified independence;standing (with LRAD PRN) Pt Will Perform Lower Body Dressing: with supervision;with adaptive equipment;sit to/from stand Pt Will Transfer to Toilet: with supervision;ambulating;bedside commode  OT Frequency: Min 2X/week       AM-PAC OT "6 Clicks" Daily Activity     Outcome Measure Help from another person eating meals?: None Help from another person taking care of personal grooming?: A Little Help from another person toileting, which includes  using toliet, bedpan, or urinal?: A Little Help from another person bathing (including washing, rinsing, drying)?: A Lot Help from another person to put on and taking off regular upper body clothing?: None Help from another person to put on and taking off regular lower body clothing?: A Lot 6 Click Score: 18   End of Session Equipment Utilized During Treatment: Gait belt;Rolling walker (2 wheels)  Activity Tolerance: Patient tolerated treatment well Patient left: in bed;with call bell/phone within reach;with bed alarm set;with family/visitor present;with SCD's reapplied (Polar care reapplied)  OT Visit Diagnosis: Unsteadiness on feet (R26.81)                Time: 6440-3474 OT Time Calculation (min): 32 min Charges:  OT General Charges $OT Visit: 1 Visit OT Evaluation $OT Eval Moderate Complexity: 1 Mod OT Treatments $Self Care/Home Management : 23-37 mins  [employer], OTDS  [employer] 06/09/2021, 1:29 PM

## 2021-06-09 NOTE — Plan of Care (Signed)
Patient discharged per MD orders at this time.All discharge instructions,education and medications reviewed with the patient.Pt expressed understanding and will comply with dc instructions.follow up appointments was also communicated to the patient.no verbal c/o or any ssx of distress.patient was discharged to the [facility] and nursing center for PT/OT services per order.report was called to staff nurse Ms Armando Reichert before transport.Pt was transported by 2 [facility] personnel on a stretcher.

## 2021-06-09 NOTE — NC FL2 (Signed)
[place] LEVEL OF CARE SCREENING TOOL     IDENTIFICATION  Patient Name: Stacy [name] Birthdate: [birth year] Sex: female Admission Date (Current Location): 06/08/2021  [facility] and [place] Number:  Engineering geologist and Address:  [facility], [address] [place], [place] [address]      Provider Number: [id number]  Attending Physician Name and Address:  Hessie Knows, MD  Relative Name and Phone Number:  Delfino Lovett SPouse [phone]    Current Level of Care: Hospital Recommended Level of Care: [place] Prior Approval Number:    Date Approved/Denied:   PASRR Number: [id number]  Discharge Plan: SNF    Current Diagnoses: Patient Active Problem List   Diagnosis Date Noted   S/P TKR (total knee replacement) using cement, left 06/08/2021   Hyperlipidemia associated with type 2 diabetes mellitus ([place]) 01/14/2020   Onychomycosis of multiple toenails with type 2 diabetes mellitus ([place]) 01/23/2019   Pain due to onychomycosis of toenails of both feet 10/06/2018   Leg edema 12/11/2016   Abnormal EKG 12/10/2016   Radial scar of breast 05/30/2015   History of foot surgery 12/15/2014   Hypertension, benign 10/07/2014   Seasonal allergies 10/07/2014   Diabetes mellitus type 2, diet-controlled ([place]) 10/07/2014   Vitamin D deficiency 10/07/2014   Varicose veins 10/07/2014   Left Achilles tendinitis 10/07/2014   Family history of breast cancer 05/12/2012    Orientation RESPIRATION BLADDER Height & Weight     Self, Time, Situation, Place  Normal (room air) Continent Weight: 90.3 kg Height:  '5\' 7"'$  (170.2 cm)  BEHAVIORAL SYMPTOMS/MOOD NEUROLOGICAL BOWEL NUTRITION STATUS      Continent Diet (see dc summary)  AMBULATORY STATUS COMMUNICATION OF NEEDS Skin   Extensive Assist Verbally Normal, Surgical wounds                       Personal Care Assistance Level of Assistance  Bathing, Feeding, Dressing Bathing  Assistance: Maximum assistance Feeding assistance: Independent Dressing Assistance: Maximum assistance     Functional Limitations Info             SPECIAL CARE FACTORS FREQUENCY  PT (By licensed PT), OT (By licensed OT)     PT Frequency: 5 times per week OT Frequency: 5 times per week            Contractures Contractures Info: Not present    Additional Factors Info  Code Status, Allergies Code Status Info: FUll code Allergies Info: Altace           Current Medications (06/09/2021):  This is the current hospital active medication list Current Facility-Administered Medications  Medication Dose Route Frequency Provider Last Rate Last Admin   0.9 %  sodium chloride infusion   Intravenous Continuous Hessie Knows, MD 75 mL/hr at 06/09/21 0408 New Bag at 06/09/21 0408   acetaminophen (TYLENOL) tablet 325-650 mg  325-650 mg Oral Q6H PRN Hessie Knows, MD       alum & mag hydroxide-simeth (MAALOX/MYLANTA) 200-200-20 MG/5ML suspension 30 mL  30 mL Oral Q4H PRN Hessie Knows, MD       aspirin EC tablet 81 mg  81 mg Oral q AM Hessie Knows, MD   81 mg at 06/09/21 2409   bisacodyl (DULCOLAX) EC tablet 5 mg  5 mg Oral Daily PRN Hessie Knows, MD   5 mg at 06/09/21 0126   cholecalciferol (VITAMIN D3) tablet 2,000 Units  2,000 Units Oral Daily Hessie Knows, MD  2,000 Units at 06/09/21 0911   diphenhydrAMINE (BENADRYL) 12.5 MG/5ML elixir 12.5-25 mg  12.5-25 mg Oral Q4H PRN Hessie Knows, MD       docusate sodium (COLACE) capsule 100 mg  100 mg Oral BID Hessie Knows, MD   100 mg at 06/09/21 0910   enoxaparin (LOVENOX) injection 30 mg  30 mg Subcutaneous Q12H Hessie Knows, MD   30 mg at 06/09/21 0845   fluticasone (FLONASE) 50 MCG/ACT nasal spray 2 spray  2 spray Each Nare Daily PRN Hessie Knows, MD       HYDROcodone-acetaminophen [facility]) 7.5-325 MG per tablet 1-2 tablet  1-2 tablet Oral Q4H PRN Hessie Knows, MD   2 tablet at 06/08/21 1648   HYDROcodone-acetaminophen (NORCO/VICODIN)  5-325 MG per tablet 1-2 tablet  1-2 tablet Oral Q4H PRN Hessie Knows, MD   1 tablet at 06/09/21 0910   loratadine (CLARITIN) tablet 10 mg  10 mg Oral Daily PRN Hessie Knows, MD   10 mg at 06/08/21 1359   magnesium hydroxide (MILK OF MAGNESIA) suspension 30 mL  30 mL Oral QHS Hessie Knows, MD   30 mL at 06/08/21 2155   menthol-cetylpyridinium (CEPACOL) lozenge 3 mg  1 lozenge Oral PRN Hessie Knows, MD       Or   phenol (CHLORASEPTIC) mouth spray 1 spray  1 spray Mouth/Throat PRN Hessie Knows, MD       methocarbamol (ROBAXIN) tablet 500 mg  500 mg Oral Q6H PRN Hessie Knows, MD   500 mg at 06/08/21 1649   Or   methocarbamol (ROBAXIN) 500 mg in dextrose 5 % 50 mL IVPB  500 mg Intravenous Q6H PRN Hessie Knows, MD       metoCLOPramide (REGLAN) tablet 5-10 mg  5-10 mg Oral Q8H PRN Hessie Knows, MD       Or   metoCLOPramide (REGLAN) injection 5-10 mg  5-10 mg Intravenous Q8H PRN Hessie Knows, MD       morphine (PF) 2 MG/ML injection 0.5-1 mg  0.5-1 mg Intravenous Q2H PRN Hessie Knows, MD       olopatadine (PATANOL) 0.1 % ophthalmic solution 1 drop  1 drop Both Eyes BID PRN Hessie Knows, MD       ondansetron [facility]) tablet 4 mg  4 mg Oral Q6H PRN Hessie Knows, MD       Or   ondansetron [facility]) injection 4 mg  4 mg Intravenous Q6H PRN Hessie Knows, MD       pantoprazole (PROTONIX) EC tablet 40 mg  40 mg Oral Daily Hessie Knows, MD   40 mg at 06/09/21 0911   rosuvastatin (CRESTOR) tablet 5 mg  5 mg Oral Q M,W,F Hessie Knows, MD   5 mg at 06/09/21 6063   senna-docusate (Senokot-S) tablet 1 tablet  1 tablet Oral QHS PRN Hessie Knows, MD   1 tablet at 06/09/21 0126   sodium phosphate (FLEET) 7-19 GM/118ML enema 1 enema  1 enema Rectal Once PRN Hessie Knows, MD       traMADol [facility]) tablet 50 mg  50 mg Oral Q6H Hessie Knows, MD   50 mg at 06/09/21 0160   triamterene-hydrochlorothiazide (MAXZIDE-25) 37.5-25 MG per tablet 1 tablet  1 tablet Oral Daily Hessie Knows, MD   1 tablet at  06/09/21 0913   zolpidem (AMBIEN) tablet 5 mg  5 mg Oral QHS PRN Hessie Knows, MD         Discharge Medications: Please see discharge summary for a list of discharge medications.  Relevant Imaging Results:  Relevant Lab Results:   Additional Information SS# [id number]  Conception Oms, RN

## 2021-06-09 NOTE — Progress Notes (Signed)
   Subjective: 1 Day Post-Op Procedure(s) (LRB): TOTAL KNEE ARTHROPLASTY (Left) Patient reports pain as mild.   Patient is well, and has had no acute complaints or problems Denies any CP, SOB, ABD pain. We will continue therapy today.  Plan is to go Home after hospital stay.  Objective: Vital signs in last 24 hours: Temp:  [97.6 F (36.4 C)-99.4 F (37.4 C)] 99.4 F (37.4 C) (05/26 0735) Pulse Rate:  [77-93] 91 (05/26 0735) Resp:  [13-20] 18 (05/26 0735) BP: (120-159)/(58-91) 138/59 (05/26 0735) SpO2:  [89 %-98 %] 97 % (05/26 0735)  Intake/Output from previous day: 05/25 0701 - 05/26 0700 In: 1600 [I.V.:1500; IV Piggyback:100] Out: 3000 [Urine:2900; Blood:100] Intake/Output this shift: No intake/output data recorded.  Recent Labs    06/08/21 1412  HGB 11.2*   Recent Labs    06/08/21 1412  WBC 5.0  RBC 4.06  HCT 36.1  PLT 219   Recent Labs    06/08/21 1412  CREATININE 0.79   No results for input(s): LABPT, INR in the last 72 hours.  EXAM General - Patient is Alert, Appropriate, and Oriented Extremity - Neurovascular intact Sensation intact distally Intact pulses distally Dorsiflexion/Plantar flexion intact No cellulitis present Compartment soft Dressing - dressing C/D/I and no drainage, Praveena intact without drainage Motor Function - intact, moving foot and toes well on exam.  Past Medical History:  Diagnosis Date   Allergy    Arthritis    Hyperlipidemia    Hypertension    Neoplasm of uncertain behavior of breast 2007   Pre-diabetes     Assessment/Plan:   1 Day Post-Op Procedure(s) (LRB): TOTAL KNEE ARTHROPLASTY (Left) Principal Problem:   S/P TKR (total knee replacement) using cement, left  Estimated body mass index is 31.17 kg/m as calculated from the following:   Height as of this encounter: '5\' 7"'$  (1.702 m).   Weight as of this encounter: 90.3 kg. Advance diet Up with therapy Pain well controlled Vital signs stable Care management  to assist with discharge pending progress with physical therapy  DVT Prophylaxis - Lovenox, TED hose, and SCDs Weight-Bearing as tolerated to left leg   T. Rachelle Hora, PA-C [place] 06/09/2021, 8:04 AM

## 2021-06-09 NOTE — Progress Notes (Signed)
Physical Therapy Treatment Patient Details Name: Stacy [name] MRN: [id number] DOB: 03-11-1945 Today's Date: 06/09/2021   History of Present Illness Patient is a 76 year old female with Primary localized osteoarthritis of left knee s/p left total knee arthroplasty.    PT Comments    Pt was pleasant and motivated to participate during the session and put forth good effort throughout. Pt performed all functional tasks extremely slowly with great effort and needed physical assist throughout.  Pt was only able to take a max of several small, effortful steps at the EOB mostly with shuffling her RLE to avoid WB through the LLE.  Pt would not be safe to attempt to return to her prior living situation at this time with discharge recommendations updated to reflect this. Pt will benefit from PT services in a SNF setting upon discharge to safely address deficits listed in patient problem list for decreased caregiver assistance and eventual return to PLOF.    Recommendations for follow up therapy are one component of a multi-disciplinary discharge planning process, led by the attending physician.  Recommendations may be updated based on patient status, additional functional criteria and insurance authorization.  Follow Up Recommendations  Skilled nursing-short term rehab (<3 hours/day)     Assistance Recommended at Discharge Frequent or constant Supervision/Assistance  Patient can return home with the following Help with stairs or ramp for entrance;Assist for transportation;A little help with bathing/dressing/bathroom;A lot of help with walking and/or transfers;Assistance with cooking/housework   Equipment Recommendations  Rolling walker (2 wheels)    Recommendations for Other Services       Precautions / Restrictions Precautions Precautions: Knee Restrictions Weight Bearing Restrictions: Yes LLE Weight Bearing: Weight bearing as tolerated     Mobility  Bed Mobility Overal bed mobility:  Needs Assistance Bed Mobility: Supine to Sit, Sit to Supine     Supine to sit: Min assist Sit to supine: Min assist   General bed mobility comments: Significantly increased time and effort with min A to manage the LLE    Transfers Overall transfer level: Needs assistance Equipment used: Rolling walker (2 wheels) Transfers: Sit to/from Stand Sit to Stand: Min assist, From elevated surface           General transfer comment: Mod verbal cues for sequencing and min A to come to full upright standing    Ambulation/Gait Ambulation/Gait assistance: Min assist Gait Distance (Feet): 3 Feet Assistive device: Rolling walker (2 wheels) Gait Pattern/deviations: Step-to pattern, Antalgic, Trunk flexed, Decreased stance time - left, Shuffle Gait velocity: decreased     General Gait Details: Pt able to amb a max of 3 feet at the EOB with very effortful, shuffling steps and min A to move the [employer]             Wheelchair Mobility    Modified Rankin (Stroke Patients Only)       Balance Overall balance assessment: Needs assistance Sitting-balance support: Feet supported Sitting balance-Leahy Scale: Good     Standing balance support: Bilateral upper extremity supported, Reliant on assistive device for balance, During functional activity Standing balance-Leahy Scale: Fair                              Cognition Arousal/Alertness: Awake/alert Behavior During Therapy: WFL for tasks assessed/performed Overall Cognitive Status: Within Functional Limits for tasks assessed  Exercises Total Joint Exercises Ankle Circles/Pumps: AROM, Strengthening, Left, 10 reps Quad Sets: AROM, Strengthening, Left, 10 reps, 5 reps Hip ABduction/ADduction: AAROM, Strengthening, Left, 10 reps Straight Leg Raises: AAROM, Strengthening, Left, 10 reps Long Arc Quad: AROM, Strengthening, Both, 10 reps, 15 reps Knee  Flexion: AROM, Strengthening, Both, 10 reps, 15 reps Goniometric ROM: L knee AROM: 7-82 deg Marching in Standing: AROM, Strengthening, Left, Standing, 5 reps Other Exercises Other Exercises: HEP education per handout Other Exercises: Positioning education to promote L knee ext PROM and prevent heel pressure    General Comments        Pertinent Vitals/Pain Pain Assessment Pain Assessment: 0-10 Pain Score: 6  Pain Location: left knee Pain Descriptors / Indicators: Aching, Dull Pain Intervention(s): Repositioned, Ice applied, Premedicated before session, Monitored during session    Home Living                          Prior Function            PT Goals (current goals can now be found in the care plan section) Progress towards PT goals: Progressing toward goals    Frequency    BID      PT Plan Discharge plan needs to be updated    Co-evaluation              AM-PAC PT "6 Clicks" Mobility   Outcome Measure  Help needed turning from your back to your side while in a flat bed without using bedrails?: A Little Help needed moving from lying on your back to sitting on the side of a flat bed without using bedrails?: A Little Help needed moving to and from a bed to a chair (including a wheelchair)?: A Little Help needed standing up from a chair using your arms (e.g., wheelchair or bedside chair)?: A Lot Help needed to walk in hospital room?: A Lot Help needed climbing 3-5 steps with a railing? : Total 6 Click Score: 14    End of Session Equipment Utilized During Treatment: Gait belt Activity Tolerance: Patient tolerated treatment well Patient left: in bed;with call bell/phone within reach;with bed alarm set;with SCD's reapplied;Other (comment) (polar care to L knee) Nurse Communication: Mobility status PT Visit Diagnosis: Pain;Other abnormalities of gait and mobility (R26.89);Muscle weakness (generalized) (M62.81) Pain - Right/Left: Left Pain - part of  body: Knee     Time: 9563-8756 PT Time Calculation (min) (ACUTE ONLY): 39 min  Charges:  $Gait Training: 8-22 mins $Therapeutic Exercise: 8-22 mins $Therapeutic Activity: 8-22 mins                    D. Scott Wilver Tignor PT, DPT 06/09/21, 11:09 AM

## 2021-06-14 DIAGNOSIS — L03116 Cellulitis of left lower limb: Secondary | ICD-10-CM | POA: Diagnosis not present

## 2021-06-14 DIAGNOSIS — R600 Localized edema: Secondary | ICD-10-CM | POA: Diagnosis not present

## 2021-06-16 DIAGNOSIS — Z96653 Presence of artificial knee joint, bilateral: Secondary | ICD-10-CM | POA: Diagnosis not present

## 2021-06-16 DIAGNOSIS — E782 Mixed hyperlipidemia: Secondary | ICD-10-CM | POA: Diagnosis not present

## 2021-06-16 DIAGNOSIS — T8454XD Infection and inflammatory reaction due to internal left knee prosthesis, subsequent encounter: Secondary | ICD-10-CM | POA: Diagnosis not present

## 2021-06-16 DIAGNOSIS — M6281 Muscle weakness (generalized): Secondary | ICD-10-CM | POA: Diagnosis not present

## 2021-06-16 DIAGNOSIS — E559 Vitamin D deficiency, unspecified: Secondary | ICD-10-CM | POA: Diagnosis not present

## 2021-06-16 DIAGNOSIS — I10 Essential (primary) hypertension: Secondary | ICD-10-CM | POA: Diagnosis not present

## 2021-06-19 DIAGNOSIS — E782 Mixed hyperlipidemia: Secondary | ICD-10-CM | POA: Diagnosis not present

## 2021-06-19 DIAGNOSIS — E559 Vitamin D deficiency, unspecified: Secondary | ICD-10-CM | POA: Diagnosis not present

## 2021-06-19 DIAGNOSIS — Z96653 Presence of artificial knee joint, bilateral: Secondary | ICD-10-CM | POA: Diagnosis not present

## 2021-06-19 DIAGNOSIS — T8454XD Infection and inflammatory reaction due to internal left knee prosthesis, subsequent encounter: Secondary | ICD-10-CM | POA: Diagnosis not present

## 2021-06-19 DIAGNOSIS — M6281 Muscle weakness (generalized): Secondary | ICD-10-CM | POA: Diagnosis not present

## 2021-06-19 DIAGNOSIS — I10 Essential (primary) hypertension: Secondary | ICD-10-CM | POA: Diagnosis not present

## 2021-06-23 DIAGNOSIS — Z96652 Presence of left artificial knee joint: Secondary | ICD-10-CM | POA: Diagnosis not present

## 2021-06-23 DIAGNOSIS — M25662 Stiffness of left knee, not elsewhere classified: Secondary | ICD-10-CM | POA: Diagnosis not present

## 2021-06-23 DIAGNOSIS — M25562 Pain in left knee: Secondary | ICD-10-CM | POA: Diagnosis not present

## 2021-06-23 DIAGNOSIS — R29898 Other symptoms and signs involving the musculoskeletal system: Secondary | ICD-10-CM | POA: Diagnosis not present

## 2021-06-28 DIAGNOSIS — M25562 Pain in left knee: Secondary | ICD-10-CM | POA: Diagnosis not present

## 2021-06-28 DIAGNOSIS — Z96652 Presence of left artificial knee joint: Secondary | ICD-10-CM | POA: Diagnosis not present

## 2021-06-30 DIAGNOSIS — Z96652 Presence of left artificial knee joint: Secondary | ICD-10-CM | POA: Diagnosis not present

## 2021-06-30 DIAGNOSIS — M25562 Pain in left knee: Secondary | ICD-10-CM | POA: Diagnosis not present

## 2021-07-04 DIAGNOSIS — Z96652 Presence of left artificial knee joint: Secondary | ICD-10-CM | POA: Diagnosis not present

## 2021-07-06 DIAGNOSIS — Z96652 Presence of left artificial knee joint: Secondary | ICD-10-CM | POA: Diagnosis not present

## 2021-07-06 DIAGNOSIS — M25562 Pain in left knee: Secondary | ICD-10-CM | POA: Diagnosis not present

## 2021-07-10 DIAGNOSIS — Z96652 Presence of left artificial knee joint: Secondary | ICD-10-CM | POA: Diagnosis not present

## 2021-07-10 DIAGNOSIS — M25562 Pain in left knee: Secondary | ICD-10-CM | POA: Diagnosis not present

## 2021-07-12 DIAGNOSIS — M25562 Pain in left knee: Secondary | ICD-10-CM | POA: Diagnosis not present

## 2021-07-12 DIAGNOSIS — Z96652 Presence of left artificial knee joint: Secondary | ICD-10-CM | POA: Diagnosis not present

## 2021-07-14 DIAGNOSIS — Z96652 Presence of left artificial knee joint: Secondary | ICD-10-CM | POA: Diagnosis not present

## 2021-07-17 ENCOUNTER — Ambulatory Visit
Admission: RE | Admit: 2021-07-17 | Discharge: 2021-07-17 | Disposition: A | Discharge status: Home / Self Care | Payer: Medicare PPO | Source: Ambulatory Visit | Attending: Orthopedic Surgery | Admitting: Orthopedic Surgery

## 2021-07-17 ENCOUNTER — Other Ambulatory Visit: Payer: Self-pay | Admitting: Orthopedic Surgery

## 2021-07-17 ENCOUNTER — Other Ambulatory Visit (HOSPITAL_COMMUNITY): Payer: Self-pay | Admitting: Orthopedic Surgery

## 2021-07-17 DIAGNOSIS — M7989 Other specified soft tissue disorders: Secondary | ICD-10-CM

## 2021-07-17 DIAGNOSIS — M25562 Pain in left knee: Secondary | ICD-10-CM | POA: Diagnosis not present

## 2021-07-17 DIAGNOSIS — R600 Localized edema: Secondary | ICD-10-CM | POA: Diagnosis not present

## 2021-07-17 DIAGNOSIS — Z96652 Presence of left artificial knee joint: Secondary | ICD-10-CM | POA: Diagnosis not present

## 2021-07-19 ENCOUNTER — Ambulatory Visit: Payer: Medicare PPO | Admitting: Family Medicine

## 2021-07-21 DIAGNOSIS — Z96652 Presence of left artificial knee joint: Secondary | ICD-10-CM | POA: Diagnosis not present

## 2021-07-25 DIAGNOSIS — M25562 Pain in left knee: Secondary | ICD-10-CM | POA: Diagnosis not present

## 2021-07-25 DIAGNOSIS — Z96652 Presence of left artificial knee joint: Secondary | ICD-10-CM | POA: Diagnosis not present

## 2021-07-26 DIAGNOSIS — M1712 Unilateral primary osteoarthritis, left knee: Secondary | ICD-10-CM | POA: Diagnosis not present

## 2021-07-26 DIAGNOSIS — Z96652 Presence of left artificial knee joint: Secondary | ICD-10-CM | POA: Diagnosis not present

## 2021-07-27 DIAGNOSIS — Z96652 Presence of left artificial knee joint: Secondary | ICD-10-CM | POA: Diagnosis not present

## 2021-08-01 DIAGNOSIS — M25562 Pain in left knee: Secondary | ICD-10-CM | POA: Diagnosis not present

## 2021-08-01 DIAGNOSIS — Z96652 Presence of left artificial knee joint: Secondary | ICD-10-CM | POA: Diagnosis not present

## 2021-08-02 ENCOUNTER — Other Ambulatory Visit: Payer: Self-pay | Admitting: Family Medicine

## 2021-08-02 DIAGNOSIS — E1169 Type 2 diabetes mellitus with other specified complication: Secondary | ICD-10-CM

## 2021-08-03 DIAGNOSIS — Z96652 Presence of left artificial knee joint: Secondary | ICD-10-CM | POA: Diagnosis not present

## 2021-08-03 DIAGNOSIS — M25562 Pain in left knee: Secondary | ICD-10-CM | POA: Diagnosis not present

## 2021-08-08 DIAGNOSIS — Z96652 Presence of left artificial knee joint: Secondary | ICD-10-CM | POA: Diagnosis not present

## 2021-08-10 DIAGNOSIS — M25562 Pain in left knee: Secondary | ICD-10-CM | POA: Diagnosis not present

## 2021-08-10 DIAGNOSIS — Z96652 Presence of left artificial knee joint: Secondary | ICD-10-CM | POA: Diagnosis not present

## 2021-08-15 ENCOUNTER — Ambulatory Visit: Payer: Medicare PPO | Admitting: Family Medicine

## 2021-08-16 ENCOUNTER — Telehealth: Payer: Medicare PPO

## 2021-08-17 ENCOUNTER — Ambulatory Visit (INDEPENDENT_AMBULATORY_CARE_PROVIDER_SITE_OTHER): Payer: Medicare PPO | Admitting: Podiatry

## 2021-08-17 ENCOUNTER — Encounter: Payer: Self-pay | Admitting: Podiatry

## 2021-08-17 DIAGNOSIS — M79675 Pain in left toe(s): Secondary | ICD-10-CM

## 2021-08-17 DIAGNOSIS — E119 Type 2 diabetes mellitus without complications: Secondary | ICD-10-CM

## 2021-08-17 DIAGNOSIS — M79674 Pain in right toe(s): Secondary | ICD-10-CM | POA: Diagnosis not present

## 2021-08-17 DIAGNOSIS — B351 Tinea unguium: Secondary | ICD-10-CM

## 2021-08-17 DIAGNOSIS — R600 Localized edema: Secondary | ICD-10-CM

## 2021-08-17 NOTE — Progress Notes (Signed)
This patient returns to my office for at risk foot care.  This patient requires this care by a professional since this patient will be at risk due to having diabetes.  This patient is unable to cut nails herself since the patient cannot reach her nails.These nails are painful walking and wearing shoes.  This patient presents for at risk foot care today.  General Appearance  Alert, conversant and in no acute stress.  Vascular  Dorsalis pedis and posterior tibial  pulses are palpable  bilaterally.  Capillary return is within normal limits  bilaterally. Temperature is within normal limits  bilaterally.  Neurologic  Senn-Weinstein monofilament wire test within normal limits  bilaterally. Muscle power within normal limits bilaterally.  Nails Thick disfigured discolored nails with subungual debris  from hallux to fifth toes bilaterally. No evidence of bacterial infection or drainage bilaterally.  Orthopedic  No limitations of motion  feet .  No crepitus or effusions noted.  No bony pathology or digital deformities noted.  Skin  normotropic skin with no porokeratosis noted bilaterally.  No signs of infections or ulcers noted.     Onychomycosis  Pain in right toes  Pain in left toes  Consent was obtained for treatment procedures.   Mechanical debridement of nails 1-5  bilaterally performed with a nail nipper.  Filed with dremel without incident. No infection or ulcer.     Return office visit    10 weeks                 Told patient to return for periodic foot care and evaluation due to potential at risk complications.   Ryden Wainer DPM  

## 2021-08-24 DIAGNOSIS — H353131 Nonexudative age-related macular degeneration, bilateral, early dry stage: Secondary | ICD-10-CM | POA: Diagnosis not present

## 2021-08-24 DIAGNOSIS — H04123 Dry eye syndrome of bilateral lacrimal glands: Secondary | ICD-10-CM | POA: Diagnosis not present

## 2021-08-24 DIAGNOSIS — M25562 Pain in left knee: Secondary | ICD-10-CM | POA: Diagnosis not present

## 2021-08-24 DIAGNOSIS — H2513 Age-related nuclear cataract, bilateral: Secondary | ICD-10-CM | POA: Diagnosis not present

## 2021-08-24 DIAGNOSIS — H25013 Cortical age-related cataract, bilateral: Secondary | ICD-10-CM | POA: Diagnosis not present

## 2021-08-24 DIAGNOSIS — H1045 Other chronic allergic conjunctivitis: Secondary | ICD-10-CM | POA: Diagnosis not present

## 2021-08-24 DIAGNOSIS — R7309 Other abnormal glucose: Secondary | ICD-10-CM | POA: Diagnosis not present

## 2021-08-24 DIAGNOSIS — Z96652 Presence of left artificial knee joint: Secondary | ICD-10-CM | POA: Diagnosis not present

## 2021-08-24 LAB — HM DIABETES EYE EXAM

## 2021-08-29 DIAGNOSIS — Z96652 Presence of left artificial knee joint: Secondary | ICD-10-CM | POA: Diagnosis not present

## 2021-09-07 DIAGNOSIS — Z96652 Presence of left artificial knee joint: Secondary | ICD-10-CM | POA: Diagnosis not present

## 2021-09-07 DIAGNOSIS — M25562 Pain in left knee: Secondary | ICD-10-CM | POA: Diagnosis not present

## 2021-09-13 NOTE — Progress Notes (Unsigned)
Name: Stacy [name]   MRN: [id number]    DOB: 1945/06/30   Date:09/14/2021       Progress Note  Subjective  Chief Complaint  Follow Up  HPI  DMII: doing well, on diet only, can't take ACE caused angioedema, last urine micro negative but due for recheck  Denies polyphagia, polydipsia or polyuria.  She is following a diabetic diet. She has been compliant with Crestor 5 mg M, W and Fridays , last LDL was at goal at 57  A1C today is at goal and is up to date with eye doctor    HTN: taking triamterene hctz, however did not take medication this am and bp is 138/80, advised to try taking just half pill and follow up for bp check in about one month. Denies chest pain, dizziness, palpitation, orthopnea  or SOB, she has chronic lower extremity swelling , she states better with compression stocking hoses , she saw vascular surgeon and was advised to continue compression stocking hoses   Malnutrition: her weight used to be in the high 190 lbs , however she states after knee replacement done in May 2023 she went to rehab, took antibiotics and had lack of appetite. She is home now and eating better but weight is not going up yet. Discussed high calorie diet and we will recheck in 3 months   Vitamin D deficiency: continue supplementation , reviewed last labs and at goal, continue supplementation .    AR: she only taking medication prn, denies rhinorrhea, nasal congestion or eye pruritis    Patient Active Problem List   Diagnosis Date Noted   S/P TKR (total knee replacement) using cement, left 06/08/2021   Hyperlipidemia associated with type 2 diabetes mellitus ([place]) 01/14/2020   Onychomycosis of multiple toenails with type 2 diabetes mellitus ([place]) 01/23/2019   Pain due to onychomycosis of toenails of both feet 10/06/2018   Abnormal EKG 12/10/2016   Radial scar of breast 05/30/2015   History of foot surgery 12/15/2014   Hypertension, benign 10/07/2014   Seasonal allergies 10/07/2014   Diabetes  mellitus type 2, diet-controlled ([place]) 10/07/2014   Vitamin D deficiency 10/07/2014   Varicose veins 10/07/2014   Family history of breast cancer 05/12/2012    Past Surgical History:  Procedure Laterality Date   BREAST BIOPSY Right 2015   3 stereo biopsies. 2 benign. one was radial scar, no excision done for radial scar   BREAST BIOPSY Left 07/16/2017   BIPHASIC STROMAL AND EPITHELIAL LESION./ Dr Bary Castilla   BREAST BIOPSY Left 08/23/2020   [place] bx of mass 5:00 2cmfn, coil marker, path pending   BREAST EXCISIONAL BIOPSY Left 1968   x 3 per pt   BREAST EXCISIONAL BIOPSY Right 2007   benign   BUNIONECTOMY Left    COLONOSCOPY  2011   DR,ISHAKIS   EXCISION OF BREAST LESION Left 10/07/2020   Procedure: EXCISION OF BREAST LESION;  Surgeon: Robert Bellow, MD;  Location: [facility] ORS;  Service: General;  Laterality: Left;   HAMMER TOE SURGERY Left    2nd toe   HEEL SPUR SURGERY Left 12/15/2014   Dr. Earleen Newport at [facility] [place]    TOTAL KNEE ARTHROPLASTY Left 06/08/2021   Procedure: TOTAL KNEE ARTHROPLASTY;  Surgeon: Hessie Knows, MD;  Location: [facility] ORS;  Service: Orthopedics;  Laterality: Left;    Family History  Problem Relation Age of Onset   Breast cancer Sister 76   Arthritis Mother  Stroke Paternal Grandmother    Cancer Brother 16       kidney   Cancer Maternal Grandmother        Gallbladder    Social History   Tobacco Use   Smoking status: Never   Smokeless tobacco: Never   Tobacco comments:    smoking cessation materials not required  Substance Use Topics   Alcohol use: No    Alcohol/week: 0.0 standard drinks of alcohol     Current Outpatient Medications:    acetaminophen (TYLENOL) 500 MG tablet, Take 500-1,000 mg by mouth every 6 (six) hours as needed (pain.)., Disp: , Rfl:    aspirin 81 MG EC tablet, Take 81 mg by mouth in the morning., Disp: , Rfl:    Cholecalciferol (VITAMIN D-3 PO), Take 2,000 Units by mouth in the morning.,  Disp: , Rfl:    CINNAMON PO, Take 1,000 mg by mouth in the morning., Disp: , Rfl:    fluticasone (FLONASE) 50 MCG/ACT nasal spray, Place 2 sprays into both nostrils daily. (Patient taking differently: Place 2 sprays into both nostrils daily as needed for allergies.), Disp: 48 g, Rfl: 1   Garlic (GARLIQUE PO), Take 1 capsule by mouth every other day., Disp: , Rfl:    loratadine (CLARITIN) 10 MG tablet, Take 1 tablet (10 mg total) by mouth daily. (Patient taking differently: Take 10 mg by mouth daily as needed for allergies.), Disp: 90 tablet, Rfl: 1   rosuvastatin (CRESTOR) 5 MG tablet, Take 1 tablet (5 mg total) by mouth every Monday, Wednesday, and Friday., Disp: 36 tablet, Rfl: 3   Tdap (ADACEL) 05-16-13.5 LF-MCG/0.5 injection, Inject 0.5 mLs into the muscle once for 1 dose., Disp: 0.5 mL, Rfl: 0   FISH OIL-BORAGE-FLAX-SAFFLOWER PO, Take 1 capsule by mouth in the morning. (Patient not taking: Reported on 09/14/2021), Disp: , Rfl:    LIDOCAINE-MENTHOL EX, Apply 1 application. topically 3 (three) times daily as needed (osteoarthritis pain.). Baker's Best Maximum Strength Topical Pain Relief Cream (4 % Lidocaine/Menthol) (Patient not taking: Reported on 09/14/2021), Disp: , Rfl:    meloxicam (MOBIC) 7.5 MG tablet, Take 7.5 mg by mouth daily., Disp: , Rfl:    triamterene-hydrochlorothiazide (MAXZIDE-25) 37.5-25 MG tablet, Take 0.5-1 tablets by mouth daily., Disp: 90 tablet, Rfl: 1  Allergies  Allergen Reactions   Altace [Ramipril] Swelling and Other (See Comments)    lips    I personally reviewed active problem list, medication list, allergies, family history, social history, health maintenance with the patient/caregiver today.   ROS  Ten systems reviewed and is negative except as mentioned in HPI   Objective  Vitals:   09/14/21 1108  BP: 138/80  Pulse: 91  Resp: 16  Temp: 97.7 F (36.5 C)  TempSrc: Oral  SpO2: 99%  Weight: 179 lb 9.6 oz (81.5 kg)  Height: '5\' 7"'$  (1.702 m)    Body  mass index is 28.13 kg/m.  Physical Exam  Constitutional: Patient appears well-developed and malnourished  No distress.  HEENT: head atraumatic, normocephalic, pupils equal and reactive to light, neck supple Cardiovascular: Normal rate, regular rhythm and normal heart sounds.  No murmur heard. Trace BLE edema. Muscular skeletal: effusion left knee, using quad cane, bends at around 85 degrees  Pulmonary/Chest: Effort normal and breath sounds normal. No respiratory distress. Abdominal: Soft.  There is no tenderness. Psychiatric: Patient has a normal mood and affect. behavior is normal. Judgment and thought content normal.   Recent Results (from the past 2160 hour(s))  HM DIABETES  EYE EXAM     Status: None   Collection Time: 08/24/21 12:00 AM  Result Value Ref Range   HM Diabetic Eye Exam No Retinopathy No Retinopathy  POCT HgB A1C     Status: Abnormal   Collection Time: 09/14/21 11:10 AM  Result Value Ref Range   Hemoglobin A1C 5.9 (A) 4.0 - 5.6 %   HbA1c POC (<> result, manual entry)     HbA1c, POC (prediabetic range)     HbA1c, POC (controlled diabetic range)        PHQ2/9:    09/14/2021   10:49 AM 01/02/2021   10:52 AM 12/22/2020   11:26 AM 07/26/2020   11:06 AM 04/27/2020   10:34 AM  Depression screen PHQ 2/9  Decreased Interest 0 0 0 0 0  Down, Depressed, Hopeless 0 0 0 0 0  PHQ - 2 Score 0 0 0 0 0  Altered sleeping 0 0     Tired, decreased energy 0 0     Change in appetite 0 0     Feeling bad or failure about yourself  0 0     Trouble concentrating 0 0     Moving slowly or fidgety/restless 0 0     Suicidal thoughts 0 0     PHQ-9 Score 0 0     Difficult doing work/chores Not difficult at all        phq 9 is negative   Fall Risk:    09/14/2021   10:49 AM 01/02/2021   10:51 AM 12/22/2020   11:27 AM 07/26/2020   11:06 AM 04/27/2020   10:34 AM  Fall Risk   Falls in the past year? 0 0 0 0 0  Number falls in past yr: 0 0 0 0 0  Injury with Fall? 0 0 0 0 0  Risk  for fall due to : No Fall Risks No Fall Risks No Fall Risks    Follow up Falls prevention discussed;Education provided Falls prevention discussed Falls prevention discussed        Functional Status Survey: Is the patient deaf or have difficulty hearing?: No Does the patient have difficulty seeing, even when wearing glasses/contacts?: No Does the patient have difficulty concentrating, remembering, or making decisions?: No Does the patient have difficulty walking or climbing stairs?: No Does the patient have difficulty dressing or bathing?: No Does the patient have difficulty doing errands alone such as visiting a doctor's office or shopping?: No    Assessment & Plan   1. Hyperlipidemia associated with type 2 diabetes mellitus (HCC)  - POCT HgB A1C - Urine Microalbumin w/creat. ratio - COMPLETE METABOLIC PANEL WITH GFR - Lipid panel  2. Anemia due to acute blood loss  - CBC with Differential/Platelet  3. Need for tetanus booster  - Tdap (ADACEL) 05-16-13.5 LF-MCG/0.5 injection; Inject 0.5 mLs into the muscle once for 1 dose.  Dispense: 0.5 mL; Refill: 0  4. S/P TKR (total knee replacement) using cement, left  Still not able to bend left knee at 90 degrees, pain is better, using a quad cane to walk  5. Breast cancer screening by mammogram  - MM 3D SCREEN BREAST BILATERAL; Future  6. Screening for colon cancer  - Fecal Globin By Immunochemistry  7. Hypertension, benign   8. Hypertension associated with type 2 diabetes mellitus (HCC)  - triamterene-hydrochlorothiazide (MAXZIDE-25) 37.5-25 MG tablet; Take 0.5-1 tablets by mouth daily.  Dispense: 90 tablet; Refill: 1  9. Mild protein-calorie malnutrition ([place])

## 2021-09-14 ENCOUNTER — Ambulatory Visit (INDEPENDENT_AMBULATORY_CARE_PROVIDER_SITE_OTHER): Payer: Medicare PPO | Admitting: Family Medicine

## 2021-09-14 ENCOUNTER — Encounter: Payer: Self-pay | Admitting: Family Medicine

## 2021-09-14 VITALS — BP 138/80 | HR 91 | Temp 97.7°F | Resp 16 | Ht 67.0 in | Wt 179.6 lb

## 2021-09-14 DIAGNOSIS — Z1211 Encounter for screening for malignant neoplasm of colon: Secondary | ICD-10-CM | POA: Diagnosis not present

## 2021-09-14 DIAGNOSIS — D62 Acute posthemorrhagic anemia: Secondary | ICD-10-CM | POA: Diagnosis not present

## 2021-09-14 DIAGNOSIS — Z96652 Presence of left artificial knee joint: Secondary | ICD-10-CM

## 2021-09-14 DIAGNOSIS — E1169 Type 2 diabetes mellitus with other specified complication: Secondary | ICD-10-CM | POA: Diagnosis not present

## 2021-09-14 DIAGNOSIS — E1159 Type 2 diabetes mellitus with other circulatory complications: Secondary | ICD-10-CM | POA: Diagnosis not present

## 2021-09-14 DIAGNOSIS — I10 Essential (primary) hypertension: Secondary | ICD-10-CM

## 2021-09-14 DIAGNOSIS — Z1231 Encounter for screening mammogram for malignant neoplasm of breast: Secondary | ICD-10-CM

## 2021-09-14 DIAGNOSIS — Z23 Encounter for immunization: Secondary | ICD-10-CM

## 2021-09-14 DIAGNOSIS — E441 Mild protein-calorie malnutrition: Secondary | ICD-10-CM

## 2021-09-14 DIAGNOSIS — E785 Hyperlipidemia, unspecified: Secondary | ICD-10-CM

## 2021-09-14 DIAGNOSIS — I152 Hypertension secondary to endocrine disorders: Secondary | ICD-10-CM

## 2021-09-14 LAB — POCT GLYCOSYLATED HEMOGLOBIN (HGB A1C): Hemoglobin A1C: 5.9 % — AB (ref 4.0–5.6)

## 2021-09-14 MED ORDER — TRIAMTERENE-HCTZ 37.5-25 MG PO TABS: 0.5000 | ORAL_TABLET | Freq: Every day | ORAL | 1 refills | Status: DC

## 2021-09-14 MED ORDER — TETANUS-DIPHTH-ACELL PERTUSSIS 5-2-15.5 LF-MCG/0.5 IM SUSP
0.5000 mL | Freq: Once | INTRAMUSCULAR | 0 refills | Status: AC
Start: 2021-09-14 — End: 2021-09-14

## 2021-09-15 ENCOUNTER — Ambulatory Visit: Payer: Self-pay

## 2021-09-15 DIAGNOSIS — Z1211 Encounter for screening for malignant neoplasm of colon: Secondary | ICD-10-CM | POA: Diagnosis not present

## 2021-09-15 DIAGNOSIS — E785 Hyperlipidemia, unspecified: Secondary | ICD-10-CM | POA: Diagnosis not present

## 2021-09-15 DIAGNOSIS — D62 Acute posthemorrhagic anemia: Secondary | ICD-10-CM | POA: Diagnosis not present

## 2021-09-15 DIAGNOSIS — E1169 Type 2 diabetes mellitus with other specified complication: Secondary | ICD-10-CM | POA: Diagnosis not present

## 2021-09-15 LAB — LIPID PANEL
Cholesterol: 132 mg/dL (ref <200)
HDL: 77 mg/dL (ref >=50)
LDL Cholesterol (Calc): 41 mg/dL (calc)
Non-HDL Cholesterol (Calc): 55 mg/dL (calc) (ref <130)
Total CHOL/HDL Ratio: 1.7 (calc) (ref <5.0)
Triglycerides: 61 mg/dL (ref <150)

## 2021-09-15 LAB — COMPLETE METABOLIC PANEL WITH GFR
AG Ratio: 1.5 (calc) (ref 1.0–2.5)
ALT: 13 U/L (ref 6–29)
AST: 15 U/L (ref 10–35)
Albumin: 4.4 g/dL (ref 3.6–5.1)
Alkaline phosphatase (APISO): 60 U/L (ref 37–153)
BUN: 12 mg/dL (ref 7–25)
CO2: 31 mmol/L (ref 20–32)
Calcium: 9.5 mg/dL (ref 8.6–10.4)
Chloride: 103 mmol/L (ref 98–110)
Creat: 0.78 mg/dL (ref 0.60–1.00)
Globulin: 2.9 g/dL (calc) (ref 1.9–3.7)
Glucose, Bld: 92 mg/dL (ref 65–99)
Potassium: 4.2 mmol/L (ref 3.5–5.3)
Sodium: 143 mmol/L (ref 135–146)
Total Bilirubin: 0.6 mg/dL (ref 0.2–1.2)
Total Protein: 7.3 g/dL (ref 6.1–8.1)
eGFR: 80 mL/min/{1.73_m2} (ref >=60)

## 2021-09-15 LAB — CBC WITH DIFFERENTIAL/PLATELET
Absolute Monocytes: 390 cells/uL (ref 200–950)
Basophils Absolute: 41 cells/uL (ref 0–200)
Basophils Relative: 1 %
Eosinophils Absolute: 209 cells/uL (ref 15–500)
Eosinophils Relative: 5.1 %
HCT: 38.9 % (ref 35.0–45.0)
Hemoglobin: 12.6 g/dL (ref 11.7–15.5)
Lymphs Abs: 1693 cells/uL (ref 850–3900)
MCH: 27.6 pg (ref 27.0–33.0)
MCHC: 32.4 g/dL (ref 32.0–36.0)
MCV: 85.3 fL (ref 80.0–100.0)
MPV: 10.5 fL (ref 7.5–12.5)
Monocytes Relative: 9.5 %
Neutro Abs: 1767 cells/uL (ref 1500–7800)
Neutrophils Relative %: 43.1 %
Platelets: 255 10*3/uL (ref 140–400)
RBC: 4.56 10*6/uL (ref 3.80–5.10)
RDW: 13.3 % (ref 11.0–15.0)
Total Lymphocyte: 41.3 %
WBC: 4.1 10*3/uL (ref 3.8–10.8)

## 2021-09-15 LAB — MICROALBUMIN / CREATININE URINE RATIO
Creatinine, Urine: 90 mg/dL (ref 20–275)
Microalb Creat Ratio: 6 mcg/mg creat (ref <30)
Microalb, Ur: 0.5 mg/dL

## 2021-09-15 NOTE — Telephone Encounter (Signed)
Pt is calling to ask for instructions on what to mail back with the cologuard. Please advise   Pt. Verifying which forms are mailed in with stool cards. Verbalizes understanding. Answer Assessment - Initial Assessment Questions 1. REASON FOR CALL or QUESTION: "What is your reason for calling today?" or "How can I best help you?" or "What question do you have that I can help answer?"     Mailing in stool card 2. CALLER: Document the source of call. (e.g., laboratory, patient).     Patient  Protocols used: PCP Call - No Triage-A-AH

## 2021-09-19 ENCOUNTER — Other Ambulatory Visit: Payer: Self-pay

## 2021-09-19 DIAGNOSIS — I152 Hypertension secondary to endocrine disorders: Secondary | ICD-10-CM

## 2021-09-20 DIAGNOSIS — M1712 Unilateral primary osteoarthritis, left knee: Secondary | ICD-10-CM | POA: Diagnosis not present

## 2021-09-20 DIAGNOSIS — Z96652 Presence of left artificial knee joint: Secondary | ICD-10-CM | POA: Diagnosis not present

## 2021-09-20 LAB — FECAL GLOBIN BY IMMUNOCHEMISTRY
FECAL GLOBIN RESULT:: NOT DETECTED
MICRO NUMBER:: 13872069
SPECIMEN QUALITY:: ADEQUATE

## 2021-09-27 ENCOUNTER — Other Ambulatory Visit: Payer: Self-pay

## 2021-09-27 DIAGNOSIS — I152 Hypertension secondary to endocrine disorders: Secondary | ICD-10-CM

## 2021-09-27 MED ORDER — TRIAMTERENE-HCTZ 37.5-25 MG PO TABS: 0.5000 | ORAL_TABLET | Freq: Every day | ORAL | 1 refills | Status: DC

## 2021-10-26 ENCOUNTER — Ambulatory Visit (INDEPENDENT_AMBULATORY_CARE_PROVIDER_SITE_OTHER): Payer: Medicare PPO

## 2021-10-26 DIAGNOSIS — Z23 Encounter for immunization: Secondary | ICD-10-CM | POA: Diagnosis not present

## 2021-11-02 ENCOUNTER — Encounter: Payer: Self-pay | Admitting: Podiatry

## 2021-11-02 ENCOUNTER — Ambulatory Visit (INDEPENDENT_AMBULATORY_CARE_PROVIDER_SITE_OTHER): Payer: Medicare PPO | Admitting: Podiatry

## 2021-11-02 DIAGNOSIS — E119 Type 2 diabetes mellitus without complications: Secondary | ICD-10-CM | POA: Diagnosis not present

## 2021-11-02 DIAGNOSIS — M79674 Pain in right toe(s): Secondary | ICD-10-CM

## 2021-11-02 DIAGNOSIS — M79675 Pain in left toe(s): Secondary | ICD-10-CM

## 2021-11-02 DIAGNOSIS — B351 Tinea unguium: Secondary | ICD-10-CM | POA: Diagnosis not present

## 2021-11-02 DIAGNOSIS — R600 Localized edema: Secondary | ICD-10-CM

## 2021-11-02 NOTE — Progress Notes (Signed)
This patient returns to my office for at risk foot care.  This patient requires this care by a professional since this patient will be at risk due to having diabetes.  This patient is unable to cut nails herself since the patient cannot reach her nails.These nails are painful walking and wearing shoes.  This patient presents for at risk foot care today.  General Appearance  Alert, conversant and in no acute stress.  Vascular  Dorsalis pedis and posterior tibial  pulses are palpable  bilaterally.  Capillary return is within normal limits  bilaterally. Temperature is within normal limits  bilaterally.  Neurologic  Senn-Weinstein monofilament wire test within normal limits  bilaterally. Muscle power within normal limits bilaterally.  Nails Thick disfigured discolored nails with subungual debris  from hallux to fifth toes bilaterally. No evidence of bacterial infection or drainage bilaterally.  Orthopedic  No limitations of motion  feet .  No crepitus or effusions noted.  No bony pathology or digital deformities noted.  Skin  normotropic skin with no porokeratosis noted bilaterally.  No signs of infections or ulcers noted.     Onychomycosis  Pain in right toes  Pain in left toes  Consent was obtained for treatment procedures.   Mechanical debridement of nails 1-5  bilaterally performed with a nail nipper.  Filed with dremel without incident. No infection or ulcer.     Return office visit    10 weeks                 Told patient to return for periodic foot care and evaluation due to potential at risk complications.   Jermiah Howton DPM  

## 2021-11-09 ENCOUNTER — Ambulatory Visit (INDEPENDENT_AMBULATORY_CARE_PROVIDER_SITE_OTHER): Payer: Medicare PPO | Admitting: Podiatry

## 2021-11-09 DIAGNOSIS — R601 Generalized edema: Secondary | ICD-10-CM

## 2021-11-09 NOTE — Progress Notes (Signed)
Subjective:  Patient ID: Stacy [name], female    DOB: 1945-02-21,  MRN: [id number]  Chief Complaint  Patient presents with   Foot Pain    76 y.o. female presents with the above complaint.  Patient presents with bilateral generalized edema to both lower extremity.  She states that it comes out of nowhere has been bothering her.  She does wear compression socks but sometimes she has a hard time getting them.  She notices getting improved in the morning but comes right back down as she continues to ambulate or be dependent on her foot.  She would like to discuss other treatment options she denies seeing anyone else prior to seeing me for this.  She denies any other acute complaints   Review of Systems: Negative except as noted in the HPI. Denies N/V/F/Ch.  Past Medical History:  Diagnosis Date   Allergy    Arthritis    Hyperlipidemia    Hypertension    Neoplasm of uncertain behavior of breast 2007   Pre-diabetes     Current Outpatient Medications:    acetaminophen (TYLENOL) 500 MG tablet, Take 500-1,000 mg by mouth every 6 (six) hours as needed (pain.)., Disp: , Rfl:    aspirin 81 MG EC tablet, Take 81 mg by mouth in the morning., Disp: , Rfl:    Cholecalciferol (VITAMIN D-3 PO), Take 2,000 Units by mouth in the morning., Disp: , Rfl:    CINNAMON PO, Take 1,000 mg by mouth in the morning., Disp: , Rfl:    FISH OIL-BORAGE-FLAX-SAFFLOWER PO, Take 1 capsule by mouth in the morning., Disp: , Rfl:    fluticasone (FLONASE) 50 MCG/ACT nasal spray, Place 2 sprays into both nostrils daily. (Patient taking differently: Place 2 sprays into both nostrils daily as needed for allergies.), Disp: 48 g, Rfl: 1   Garlic (GARLIQUE PO), Take 1 capsule by mouth every other day., Disp: , Rfl:    LIDOCAINE-MENTHOL EX, Apply 1 application  topically 3 (three) times daily as needed (osteoarthritis pain.). Baker's Best Maximum Strength Topical Pain Relief Cream (4 % Lidocaine/Menthol), Disp: , Rfl:     loratadine (CLARITIN) 10 MG tablet, Take 1 tablet (10 mg total) by mouth daily. (Patient taking differently: Take 10 mg by mouth daily as needed for allergies.), Disp: 90 tablet, Rfl: 1   meloxicam (MOBIC) 7.5 MG tablet, Take 7.5 mg by mouth daily., Disp: , Rfl:    rosuvastatin (CRESTOR) 5 MG tablet, Take 1 tablet (5 mg total) by mouth every Monday, Wednesday, and Friday., Disp: 36 tablet, Rfl: 3   triamterene-hydrochlorothiazide (MAXZIDE-25) 37.5-25 MG tablet, Take 0.5-1 tablets by mouth daily., Disp: 90 tablet, Rfl: 1  Social History   Tobacco Use  Smoking Status Never  Smokeless Tobacco Never  Tobacco Comments   smoking cessation materials not required    Allergies  Allergen Reactions   Altace [Ramipril] Swelling and Other (See Comments)    lips   Objective:  There were no vitals filed for this visit. There is no height or weight on file to calculate BMI. Constitutional Well developed. Well nourished.  Vascular Dorsalis pedis pulses palpable bilaterally. Posterior tibial pulses palpable bilaterally. Capillary refill normal to all digits.  No cyanosis or clubbing noted. Pedal hair growth normal.  Neurologic Normal speech. Oriented to person, place, and time. Epicritic sensation to light touch grossly present bilaterally.  Dermatologic Generalized edema noted to bilateral foot 1+ pitting edema.  No open wounds or lesion noted.  Achiness noted generalized across the both  foot and ankle likely due to mild to moderate swelling.  Orthopedic: Normal joint ROM without pain or crepitus bilaterally. No visible deformities. No bony tenderness.   Radiographs: None Assessment:   1. Generalized edema    Plan:  Patient was evaluated and treated and all questions answered.  Bilateral generalized edema -I will Russians and concerns were discussed with the patient in extensive detail.  Given the amount of edema that is present I believe she will benefit from compression anklet.  I  encouraged her to put those on as they might be a little bit easier.  I encouraged her to elevate as well very aggressively she states understanding.  If it continues to bother her I have asked her to come back and see me.  She states understanding.  No follow-ups on file.

## 2021-11-21 ENCOUNTER — Ambulatory Visit
Admission: RE | Admit: 2021-11-21 | Discharge: 2021-11-21 | Disposition: A | Discharge status: Home / Self Care | Payer: Medicare PPO | Source: Ambulatory Visit | Attending: Family Medicine | Admitting: Family Medicine

## 2021-11-21 DIAGNOSIS — Z1231 Encounter for screening mammogram for malignant neoplasm of breast: Secondary | ICD-10-CM | POA: Diagnosis not present

## 2021-11-22 ENCOUNTER — Other Ambulatory Visit: Payer: Self-pay | Admitting: Family Medicine

## 2021-11-22 DIAGNOSIS — N63 Unspecified lump in unspecified breast: Secondary | ICD-10-CM

## 2021-11-22 DIAGNOSIS — R928 Other abnormal and inconclusive findings on diagnostic imaging of breast: Secondary | ICD-10-CM

## 2021-12-11 DIAGNOSIS — B338 Other specified viral diseases: Secondary | ICD-10-CM | POA: Diagnosis not present

## 2021-12-14 ENCOUNTER — Ambulatory Visit: Payer: Medicare PPO | Admitting: Family Medicine

## 2021-12-14 ENCOUNTER — Ambulatory Visit
Admission: RE | Admit: 2021-12-14 | Discharge: 2021-12-14 | Disposition: A | Discharge status: Home / Self Care | Payer: Medicare PPO | Source: Ambulatory Visit | Attending: Family Medicine | Admitting: Family Medicine

## 2021-12-14 DIAGNOSIS — R928 Other abnormal and inconclusive findings on diagnostic imaging of breast: Secondary | ICD-10-CM | POA: Diagnosis not present

## 2021-12-14 DIAGNOSIS — N63 Unspecified lump in unspecified breast: Secondary | ICD-10-CM | POA: Insufficient documentation

## 2021-12-15 ENCOUNTER — Other Ambulatory Visit: Payer: Self-pay | Admitting: Family Medicine

## 2021-12-15 DIAGNOSIS — N63 Unspecified lump in unspecified breast: Secondary | ICD-10-CM

## 2021-12-15 DIAGNOSIS — R928 Other abnormal and inconclusive findings on diagnostic imaging of breast: Secondary | ICD-10-CM

## 2021-12-26 ENCOUNTER — Ambulatory Visit: Payer: Medicare PPO

## 2022-01-02 ENCOUNTER — Ambulatory Visit
Admission: RE | Admit: 2022-01-02 | Discharge: 2022-01-02 | Disposition: A | Discharge status: Home / Self Care | Payer: Medicare PPO | Source: Ambulatory Visit | Attending: Family Medicine | Admitting: Family Medicine

## 2022-01-02 DIAGNOSIS — R928 Other abnormal and inconclusive findings on diagnostic imaging of breast: Secondary | ICD-10-CM | POA: Diagnosis not present

## 2022-01-02 DIAGNOSIS — N63 Unspecified lump in unspecified breast: Secondary | ICD-10-CM | POA: Insufficient documentation

## 2022-01-02 DIAGNOSIS — Z170 Estrogen receptor positive status [ER+]: Secondary | ICD-10-CM | POA: Diagnosis not present

## 2022-01-02 DIAGNOSIS — N6322 Unspecified lump in the left breast, upper inner quadrant: Secondary | ICD-10-CM | POA: Diagnosis not present

## 2022-01-02 DIAGNOSIS — C50212 Malignant neoplasm of upper-inner quadrant of left female breast: Secondary | ICD-10-CM | POA: Diagnosis not present

## 2022-01-02 DIAGNOSIS — C50812 Malignant neoplasm of overlapping sites of left female breast: Secondary | ICD-10-CM | POA: Diagnosis not present

## 2022-01-02 HISTORY — PX: BREAST BIOPSY: SHX20

## 2022-01-02 MED ORDER — LIDOCAINE-EPINEPHRINE 1 %-1:100000 IJ SOLN
10.0000 mL | Freq: Once | INTRAMUSCULAR | Status: AC
  Administered 2022-01-02: 10 mL

## 2022-01-02 MED ORDER — LIDOCAINE HCL (PF) 1 % IJ SOLN
3.0000 mL | Freq: Once | INTRAMUSCULAR | Status: AC
  Administered 2022-01-02: 3 mL

## 2022-01-03 ENCOUNTER — Encounter: Payer: Self-pay | Admitting: *Deleted

## 2022-01-03 DIAGNOSIS — C50919 Malignant neoplasm of unspecified site of unspecified female breast: Secondary | ICD-10-CM

## 2022-01-03 NOTE — Progress Notes (Signed)
Received referral for newly diagnosed breast cancer from [facility].  Navigation initiated.  Stacy [name] wanted to wait until 1st of January to see the MD's.   Appointments scheduled for Dr. Peyton Najjar and Dr. Tasia Catchings on 01/16/22.  Details given to her.

## 2022-01-05 ENCOUNTER — Other Ambulatory Visit: Payer: Medicare PPO

## 2022-01-12 ENCOUNTER — Encounter: Payer: Self-pay | Admitting: Oncology

## 2022-01-12 ENCOUNTER — Encounter: Payer: Self-pay | Admitting: Diagnostic Radiology

## 2022-01-12 LAB — SURGICAL PATHOLOGY

## 2022-01-16 ENCOUNTER — Ambulatory Visit: Payer: Self-pay | Admitting: General Surgery

## 2022-01-16 ENCOUNTER — Encounter: Payer: Self-pay | Admitting: Oncology

## 2022-01-16 ENCOUNTER — Inpatient Hospital Stay: Payer: Medicare PPO | Attending: Oncology | Admitting: Oncology

## 2022-01-16 ENCOUNTER — Other Ambulatory Visit: Payer: Self-pay | Admitting: General Surgery

## 2022-01-16 ENCOUNTER — Inpatient Hospital Stay: Payer: Medicare PPO

## 2022-01-16 VITALS — BP 154/83 | HR 87 | Temp 97.9°F | Resp 18 | Ht 68.0 in | Wt 192.9 lb

## 2022-01-16 DIAGNOSIS — Z800 Family history of malignant neoplasm of digestive organs: Secondary | ICD-10-CM | POA: Diagnosis not present

## 2022-01-16 DIAGNOSIS — Z79899 Other long term (current) drug therapy: Secondary | ICD-10-CM | POA: Diagnosis not present

## 2022-01-16 DIAGNOSIS — Z8261 Family history of arthritis: Secondary | ICD-10-CM | POA: Insufficient documentation

## 2022-01-16 DIAGNOSIS — I10 Essential (primary) hypertension: Secondary | ICD-10-CM | POA: Diagnosis not present

## 2022-01-16 DIAGNOSIS — Z823 Family history of stroke: Secondary | ICD-10-CM | POA: Insufficient documentation

## 2022-01-16 DIAGNOSIS — C50212 Malignant neoplasm of upper-inner quadrant of left female breast: Secondary | ICD-10-CM | POA: Insufficient documentation

## 2022-01-16 DIAGNOSIS — Z8051 Family history of malignant neoplasm of kidney: Secondary | ICD-10-CM | POA: Insufficient documentation

## 2022-01-16 DIAGNOSIS — E785 Hyperlipidemia, unspecified: Secondary | ICD-10-CM | POA: Insufficient documentation

## 2022-01-16 DIAGNOSIS — Z87891 Personal history of nicotine dependence: Secondary | ICD-10-CM | POA: Insufficient documentation

## 2022-01-16 DIAGNOSIS — Z170 Estrogen receptor positive status [ER+]: Secondary | ICD-10-CM | POA: Diagnosis not present

## 2022-01-16 DIAGNOSIS — Z7189 Other specified counseling: Secondary | ICD-10-CM | POA: Diagnosis not present

## 2022-01-16 DIAGNOSIS — Z803 Family history of malignant neoplasm of breast: Secondary | ICD-10-CM

## 2022-01-16 DIAGNOSIS — C50919 Malignant neoplasm of unspecified site of unspecified female breast: Secondary | ICD-10-CM | POA: Diagnosis not present

## 2022-01-16 LAB — COMPREHENSIVE METABOLIC PANEL
ALT: 20 U/L (ref 0–44)
AST: 19 U/L (ref 15–41)
Albumin: 4.2 g/dL (ref 3.5–5.0)
Alkaline Phosphatase: 62 U/L (ref 38–126)
Anion gap: 10 (ref 5–15)
BUN: 17 mg/dL (ref 8–23)
CO2: 28 mmol/L (ref 22–32)
Calcium: 9.1 mg/dL (ref 8.9–10.3)
Chloride: 101 mmol/L (ref 98–111)
Creatinine, Ser: 0.72 mg/dL (ref 0.44–1.00)
GFR, Estimated: >60 mL/min (ref >60)
Glucose, Bld: 101 mg/dL — ABNORMAL HIGH (ref 70–99)
Potassium: 3.6 mmol/L (ref 3.5–5.1)
Sodium: 139 mmol/L (ref 135–145)
Total Bilirubin: 0.7 mg/dL (ref 0.3–1.2)
Total Protein: 7.8 g/dL (ref 6.5–8.1)

## 2022-01-16 LAB — CBC WITH DIFFERENTIAL/PLATELET
Abs Immature Granulocytes: 0.01 10*3/uL (ref 0.00–0.07)
Basophils Absolute: 0 10*3/uL (ref 0.0–0.1)
Basophils Relative: 1 %
Eosinophils Absolute: 0.2 10*3/uL (ref 0.0–0.5)
Eosinophils Relative: 3 %
HCT: 36.6 % (ref 36.0–46.0)
Hemoglobin: 11.9 g/dL — ABNORMAL LOW (ref 12.0–15.0)
Immature Granulocytes: 0 %
Lymphocytes Relative: 22 %
Lymphs Abs: 1.3 10*3/uL (ref 0.7–4.0)
MCH: 28.1 pg (ref 26.0–34.0)
MCHC: 32.5 g/dL (ref 30.0–36.0)
MCV: 86.3 fL (ref 80.0–100.0)
Monocytes Absolute: 0.5 10*3/uL (ref 0.1–1.0)
Monocytes Relative: 8 %
Neutro Abs: 3.9 10*3/uL (ref 1.7–7.7)
Neutrophils Relative %: 66 %
Platelets: 221 10*3/uL (ref 150–400)
RBC: 4.24 MIL/uL (ref 3.87–5.11)
RDW: 15 % (ref 11.5–15.5)
WBC: 5.9 10*3/uL (ref 4.0–10.5)
nRBC: 0 % (ref 0.0–0.2)

## 2022-01-16 NOTE — Assessment & Plan Note (Addendum)
Left breast invasive carcinoma, ER+. PR+. HER2-  Pathology and radiology counseling: Discussed with the patient, the details of pathology including the type of breast cancer,the clinical staging, the significance of ER, PR and HER-2/neu receptors and the implications for treatment. After reviewing the pathology in detail, we proceeded to discuss the different treatment options between surgery, radiation, chemotherapy, antiestrogen therapies.  Treatment plan: 1.  Breast surgery-patient decides to proceed with left mastectomy. 2. Oncotype DX testing on the surgical specimen to determine if she would benefit from adjuvant chemo 3.  Adjuvant antiestrogen therapy  Return to clinic based upon surgery pathology and Oncotype DX test result

## 2022-01-16 NOTE — Progress Notes (Signed)
Hematology/Oncology Consult Note [phone] [phone]     REFERRING PROVIDER: Steele Sizer, MD   Patient Care Team: Steele Sizer, MD as PCP - General (Family Medicine) Idelle Leech, OD as Consulting Physician (Optometry) Bary Castilla, Forest Gleason, MD as Consulting Physician (General Surgery) Germaine Pomfret, [facility] (Pharmacist) Gardiner Barefoot, DPM as Consulting Physician (Podiatry) Daiva Huge, RN as Oncology Nurse Navigator  CHIEF COMPLAINTS/PURPOSE OF CONSULTATION:  Left breast invasive carcinoma  ASSESSMENT & PLAN:   Cancer Staging  Invasive carcinoma of breast [facility]) Staging form: Breast, AJCC 8th Edition - Clinical: Stage IA (cT1c, cN0, cM0, G2, ER+, PR+, HER2-) - Signed by Earlie Server, MD on 01/16/2022   Invasive carcinoma of breast ([place]) Left breast invasive carcinoma, ER+. PR+. HER2-  Pathology and radiology counseling: Discussed with the patient, the details of pathology including the type of breast cancer,the clinical staging, the significance of ER, PR and HER-2/neu receptors and the implications for treatment. After reviewing the pathology in detail, we proceeded to discuss the different treatment options between surgery, radiation, chemotherapy, antiestrogen therapies.  Treatment plan: 1.  Breast surgery-patient decides to proceed with left mastectomy. 2. Oncotype DX testing on the surgical specimen to determine if she would benefit from adjuvant chemo 3.  Adjuvant antiestrogen therapy  Return to clinic based upon surgery pathology and Oncotype DX test result   Goals of care, counseling/discussion Discussed with patient.  Family history of breast cancer Refer to genetic counselor  Orders Placed This Encounter  Procedures   CBC with Differential/Platelet    Standing Status:   Future    Number of Occurrences:   1    Standing Expiration Date:   01/17/2023   Comprehensive metabolic panel    Standing Status:   Future    Number of  Occurrences:   1    Standing Expiration Date:   01/17/2023   Cancer antigen 27.29    Standing Status:   Future    Number of Occurrences:   1    Standing Expiration Date:   01/17/2023   Cancer antigen 15-3    Standing Status:   Future    Number of Occurrences:   1    Standing Expiration Date:   01/17/2023   Ambulatory referral to Genetics    Referral Priority:   Routine    Referral Type:   Consultation    Referral Reason:   Specialty Services Required    Referred to Provider:   Faith Rogue T    Number of Visits Requested:   1   Follow-up 2 to 3 weeks after surgery. All questions were answered. The patient knows to call the clinic with any problems, questions or concerns.  Earlie Server, MD, PhD [facility] Hematology Oncology 01/16/2022    HISTORY OF PRESENTING ILLNESS:  Stacy [name] 77 y.o. female presents to establish care for left breast invasive carcinoma I have reviewed her chart and materials related to her cancer extensively and collaborated history with the patient. Summary of oncologic history is as follows: Oncology History  Invasive carcinoma of breast ([place])  11/21/2021 Mammogram   Bilateral screening mammogram showed In the left breast, a possible mass warrants further evaluation. In the right breast, no findings suspicious for malignancy.   12/14/2021 Mammogram   Left unilateral diagnostic mammogram/ ultrasound showed Suspicious 1.2 cm mass over the 10 o'clock position of the left breast 4 cm from the nipple. No pathologically enlarged Left axilla lymph node    01/12/2022 Initial Diagnosis  Invasive carcinoma of breast   -Left breast lesion ultrasound-guided biopsy showed invasive mammary carcinoma with mucinous features, grade 2, DCIS negative, LVI negative ER 90%, PR 20%, HER2 IHC 2+ equivocal, FISH negative.  Menarche at age of [age] First live birth at age of 84 OCP use: 5 years Menopausal status: Postmenopausal, LMP in 23s. 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 + Family history of breast cancer.  She has no new concerns today.  MEDICAL HISTORY:  Past Medical History:  Diagnosis Date   Allergy    Arthritis    Hyperlipidemia    Hypertension    Neoplasm of uncertain behavior of breast 2007   Pre-diabetes     SURGICAL HISTORY: Past Surgical History:  Procedure Laterality Date   BREAST BIOPSY Right 2015   3 stereo biopsies. 2 benign. one was radial scar, no excision done for radial scar   BREAST BIOPSY Left 07/16/2017   BIPHASIC STROMAL AND EPITHELIAL LESION./ Dr Bary Castilla   BREAST BIOPSY Left 08/23/2020   [place] bx of mass 5:00 2cmfn, coil marker, FIBROEPITHELIAL LESION WITH INTRACANALICULAR GROWTH PATTERN AND VARIABLE STROMAL HYPERCELLULARITY   BREAST BIOPSY Left 01/02/2022   [place] bx path pending 10:00 ribbon clip   BREAST BIOPSY Left 01/02/2022   [place] LT BREAST BX W LOC DEV 1ST LESION IMG BX SPEC US GUIDE 01/02/2022 [facility]   BREAST EXCISIONAL BIOPSY Left 1968   x 3 per pt   BREAST EXCISIONAL BIOPSY Right 2007   benign   BUNIONECTOMY Left    COLONOSCOPY  2011   DR,ISHAKIS   EXCISION OF BREAST LESION Left 10/07/2020   Procedure: EXCISION OF BREAST LESION;  Surgeon: Robert Bellow, MD;  Location: [facility] ORS;  Service: General;  Laterality: Left; FIBROEPITHELIAL LESION WITH NTRACANALICULAR GROWTH PATTERN AND VARIABLE STROMAL   HAMMER TOE SURGERY Left    2nd toe   HEEL SPUR SURGERY Left 12/15/2014   Dr. 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  Tobacco Use   Smoking status: Former    Packs/day: 0.25    Years: 2.00    Total pack years: 0.50    Types: Cigarettes    Quit date: 1974    Years since quitting: 50.0   Smokeless tobacco: Never   Tobacco comments:    smoking cessation materials not required  Vaping Use   Vaping Use: Never used  Substance and Sexual Activity   Alcohol use: No    Alcohol/week: 0.0 standard drinks of alcohol  Drug use: No   Sexual activity: Not Currently    Comment: Husband has prostate issues  Other Topics Concern   Not on file  Social History Narrative   Not on file   Social Determinants of Health   Financial Resource Strain: Low Risk  (02/15/2021)   Overall Financial Resource Strain (CARDIA)    Difficulty of Paying Living Expenses: Not hard at all  Food Insecurity: No Food Insecurity (12/22/2020)   Hunger Vital Sign    Worried About Running Out of Food in the Last Year: Never true    Ran Out of Food in the Last Year: Never true  Transportation Needs: No Transportation Needs (12/22/2020)   PRAPARE - Hydrologist (Medical): No    Lack of Transportation (Non-Medical): No  Physical Activity: Inactive (12/22/2020)   Exercise Vital Sign    Days of Exercise per Week: 0 days    Minutes of Exercise per Session: 0 min  Stress: No Stress Concern Present (12/22/2020)   [place]    Feeling of Stress : Not at all  Social  Connections: [place] (12/22/2020)   Social Connection and Isolation Panel [NHANES]    Frequency of Communication with Friends and Family: More than three times a week    Frequency of Social Gatherings with Friends and Family: Once a week    Attends Religious Services: More than 4 times per year    Active Member of [employer] or Organizations: Yes    Attends Archivist Meetings: 1 to 4 times per year    Marital Status: Married  Human resources officer Violence: Not At Risk (12/22/2020)   Humiliation, Afraid, Rape, and Kick questionnaire    Fear of Current or Ex-Partner: No    Emotionally Abused: No    Physically Abused: No    Sexually Abused: No    FAMILY HISTORY: Family History  Problem Relation Age of Onset   Stroke Mother    Arthritis Mother    Breast cancer Sister 56   Breast cancer Sister    Cancer Brother 53       kidney   Kidney cancer Brother    Cancer Maternal Grandmother        Gallbladder   Stroke Paternal Grandmother     ALLERGIES:  is allergic to altace [ramipril].  MEDICATIONS:  Current Outpatient Medications  Medication Sig Dispense Refill   acetaminophen (TYLENOL) 500 MG tablet Take 500-1,000 mg by mouth every 6 (six) hours as needed (pain.).     aspirin 81 MG EC tablet Take 81 mg by mouth in the morning.     Cholecalciferol (VITAMIN D-3 PO) Take 2,000 Units by mouth in the morning.     CINNAMON PO Take 1,000 mg by mouth in the morning.     FISH OIL-BORAGE-FLAX-SAFFLOWER PO Take 1 capsule by mouth in the morning.     LIDOCAINE-MENTHOL EX Apply 1 application  topically 3 (three) times daily as needed (osteoarthritis pain.). [employer] Maximum Strength Topical Pain Relief Cream (4 % Lidocaine/Menthol)     loratadine (CLARITIN) 10 MG tablet Take 1 tablet (10 mg total) by mouth daily. (Patient taking differently: Take 10 mg by mouth daily as needed for allergies.) 90 tablet 1   meloxicam (MOBIC) 7.5 MG tablet Take 7.5 mg by mouth daily.      rosuvastatin (CRESTOR) 5 MG tablet Take 1 tablet (5 mg total) by mouth every Monday, Wednesday, and Friday. [place]  tablet 3   triamterene-hydrochlorothiazide (MAXZIDE-25) 37.5-25 MG tablet Take 0.5-1 tablets by mouth daily. 90 tablet 1   fluticasone (FLONASE) 50 MCG/ACT nasal spray Place 2 sprays into both nostrils daily. (Patient not taking: Reported on 01/16/2022) 48 g 1   No current facility-administered medications for this visit.    Review of Systems - Oncology   PHYSICAL EXAMINATION: ECOG PERFORMANCE STATUS: 0 - Asymptomatic  Vitals:   01/16/22 1105  BP: (!) 154/83  Pulse: 87  Resp: 18  Temp: 97.9 F (36.6 C)   Filed Weights   01/16/22 1105  Weight: 192 lb 14.4 oz (87.5 kg)    Physical Exam Constitutional:      General: She is not in acute distress.    Appearance: She is not diaphoretic.  HENT:     Head: Normocephalic and atraumatic.     Nose: Nose normal.     Mouth/Throat:     Pharynx: No oropharyngeal exudate.  Eyes:     General: No scleral icterus.    Pupils: Pupils are equal, round, and reactive to light.  Cardiovascular:     Rate and Rhythm: Normal rate and regular rhythm.     Heart sounds: No murmur heard. Pulmonary:     Effort: Pulmonary effort is normal. No respiratory distress.     Breath sounds: No rales.  Chest:     Chest wall: No tenderness.  Abdominal:     General: There is no distension.     Palpations: Abdomen is soft.     Tenderness: There is no abdominal tenderness.  Musculoskeletal:        General: Normal range of motion.     Cervical back: Normal range of motion and neck supple.  Skin:    General: Skin is warm and dry.     Findings: No erythema.  Neurological:     Mental Status: She is alert and oriented to person, place, and time.     Cranial Nerves: No cranial nerve deficit.     Motor: No abnormal muscle tone.     Coordination: Coordination normal.  Psychiatric:        Mood and Affect: Affect normal.    Breast exam was performed in  seated and lying down position. Patient is status post left breast 10:00 lesion biopsy.  Focal tissue swelling and bruising.  No palpable breast masses in right breast..  No palpable axillary adenopathy bilaterally.   LABORATORY DATA:  I have reviewed the data as listed    Latest Ref Rng & Units 01/16/2022   11:39 AM 09/14/2021   11:58 AM 06/08/2021    2:12 PM  CBC  WBC 4.0 - 10.5 K/uL 5.9  4.1  5.0   Hemoglobin 12.0 - 15.0 g/dL 11.9  12.6  11.2   Hematocrit 36.0 - 46.0 % 36.6  38.9  36.1   Platelets 150 - 400 K/uL 221  255  219       Latest Ref Rng & Units 01/16/2022   11:39 AM 09/14/2021   11:58 AM 06/08/2021    2:12 PM  CMP  Glucose 70 - 99 mg/dL 101  92    BUN 8 - 23 mg/dL 17  12    Creatinine 0.44 - 1.00 mg/dL 0.72  0.78  0.79   Sodium 135 - 145 mmol/L 139  143    Potassium 3.5 - 5.1 mmol/L 3.6  4.2    Chloride 98 - 111 mmol/L 101  103    CO2 22 - 32  mmol/L 28  31    Calcium 8.9 - 10.3 mg/dL 9.1  9.5    Total Protein 6.5 - 8.1 g/dL 7.8  7.3    Total Bilirubin 0.3 - 1.2 mg/dL 0.7  0.6    Alkaline Phos 38 - 126 U/L 62     AST 15 - 41 U/L 19  15    ALT 0 - 44 U/L 20  13       RADIOGRAPHIC STUDIES: I have personally reviewed the radiological images as listed and agreed with the findings in the report. [place] LT BREAST BX W LOC DEV 1ST LESION IMG BX SPEC US GUIDE  Addendum Date: 01/04/2022   ADDENDUM REPORT: 01/04/2022 13:45 ADDENDUM: PATHOLOGY revealed: A. BREAST, LEFT 10:00 4 CM FN; ULTRASOUND-GUIDED BIOPSY: - INVASIVE MAMMARY CARCINOMA WITH MUCINOUS FEATURES. 14 mm in this sample. Grade 2. Ductal carcinoma in situ: Not identified. Lymphovascular invasion: Not identified. Pathology results are CONCORDANT with imaging findings, per Dr. Valentino Saxon. Pathology results and recommendations were discussed with patient via telephone on 01/03/2022. Patient reported biopsy site doing well with no adverse symptoms, and only slight tenderness at the site. Post biopsy care instructions  were reviewed, questions were answered and my direct phone number was provided. Patient was instructed to call [facility] for any additional questions or concerns related to biopsy site. RECOMMENDATIONS: Surgical and oncological consultation. Request for surgical and oncological consultation relayed to Casper Harrison RN at [facility] by Electa Sniff RN on 01/03/2022. Pathology results reported by Electa Sniff RN on 01/04/2022. Electronically Signed   By: Valentino Saxon M.D.   On: 01/04/2022 13:45   Result Date: 01/04/2022 CLINICAL DATA:  Suspicious LEFT breast mass. History of a phyllodes in LEFT breast and radial scar in the RIGHT breast. EXAM: ULTRASOUND GUIDED LEFT BREAST CORE NEEDLE BIOPSY COMPARISON:  Previous exam(s). PROCEDURE: I met with the patient and we discussed the procedure of ultrasound-guided biopsy, including benefits and alternatives. We discussed the high likelihood of a successful procedure. We discussed the risks of the procedure, including infection, bleeding, tissue injury, clip migration, and inadequate sampling. Informed written consent was given. The usual time-out protocol was performed immediately prior to the procedure. Lesion quadrant: Upper inner quadrant Using sterile technique and 1% lidocaine and 1% lidocaine with epinephrine as local anesthetic, under direct ultrasound visualization, a 14 gauge spring-loaded device was used to perform biopsy of mass at 10 o'clock 4 cm from the nipple using a lateral approach. At the conclusion of the procedure a RIBBON shaped tissue marker clip was deployed into the biopsy cavity. Follow up 2 view mammogram was performed and dictated separately. IMPRESSION: Ultrasound guided biopsy of a suspicious LEFT breast mass. No apparent complications. Electronically Signed: By: Valentino Saxon M.D. On: 01/02/2022 13:53  MM CLIP PLACEMENT LEFT  Result Date: 01/02/2022 CLINICAL DATA:  Status post ultrasound-guided biopsy  EXAM: 3D DIAGNOSTIC LEFT MAMMOGRAM POST ULTRASOUND BIOPSY COMPARISON:  Previous exam(s). FINDINGS: 3D Mammographic images were obtained following ultrasound guided biopsy of a LEFT breast mass. The RIBBON biopsy marking clip is in expected position at the site of biopsy. It is in the posterior aspect of the suspicious mass. IMPRESSION: Appropriate positioning of the RIBBON shaped biopsy marking clip at the site of biopsy in the upper inner breast. Final Assessment: Post Procedure Mammograms for Marker Placement Electronically Signed   By: Valentino Saxon M.D.   On: 01/02/2022 13:52

## 2022-01-16 NOTE — H&P (Signed)
PATIENT PROFILE: Stacy [name] is a 77 y.o. female who presents to the Clinic for consultation at the request of Dr. Ancil Boozer for evaluation of left breast cancer.  PCP:  Stacy Chance, MD  HISTORY OF PRESENT ILLNESS: Stacy [name] reports she had her usual screening mammogram on 11/22/2021.  Screening mammogram shows suspicious mass on the left breast.  This led to diagnostic mammogram.  Diagnostic mammogram and breast ultrasound done on 12/14/2021.  This shows a 1.2 mass of the left breast, hypoechoic, irregular shaped.  Upon chart review, the patient has had multiple excisional biopsy of the left breast.  Last excisional biopsy was done on 10/07/2020.  There was for the phyllodes tumor.  They feel that his tumor was in the the 5 o'clock position.  Final pathology showed benign phyllodes tumor.  Family history of breast cancer: 2 Sister with breast cancer Family history of other cancers: Grandmother with bladder cancer Menarche: Cannot recall Menopause: Cannot recall Used OCP: Yes Used estrogen and progesterone therapy: No History of Radiation to the chest: No Number of pregnancies: 1 Age of pregnancies: 29 Previous breast biopsies: Multiple bilateral breast needle and excisional biopsies.  No previous malignancy.  PROBLEM LIST: Problem List  Never Reviewed          Noted   S/P TKR (total knee replacement) using cement, left 06/08/2021   Hyperlipidemia associated with type 2 diabetes mellitus  01/14/2020   Onychomycosis of multiple toenails with type 2 diabetes mellitus  01/23/2019   Diabetes mellitus type 2, diet-controlled (CMS-HCC) 10/07/2014   Hypertension, benign 10/07/2014   Left Achilles tendinitis 10/07/2014   Seasonal allergies 10/07/2014   Varicose veins 10/07/2014   Vitamin D deficiency 10/07/2014   Family history of breast cancer 05/12/2012    GENERAL REVIEW OF SYSTEMS:   General ROS: negative for - chills, fatigue, fever, weight gain or weight loss Allergy and Immunology ROS:  negative for - hives  Hematological and Lymphatic ROS: negative for - bleeding problems or bruising, negative for palpable nodes Endocrine ROS: negative for - heat or cold intolerance, hair changes Respiratory ROS: negative for - cough, shortness of breath or wheezing Cardiovascular ROS: no chest pain or palpitations GI ROS: negative for nausea, vomiting, abdominal pain, diarrhea, constipation Musculoskeletal ROS: negative for - joint swelling or muscle pain Neurological ROS: negative for - confusion, syncope Dermatological ROS: negative for pruritus and rash Psychiatric: negative for anxiety, depression, difficulty sleeping and memory loss  MEDICATIONS: Current Outpatient Medications  Medication Sig Dispense Refill   acetaminophen (TYLENOL) 500 MG tablet Take 1 tablet by mouth every 6 (six) hours as needed     aspirin 81 MG EC tablet Take 81 mg by mouth once daily     cholecalciferol (VITAMIN D3) 1000 unit tablet Take 1,000 Units by mouth once daily     cinnamon bark 500 mg capsule Take 2 capsules by mouth once daily     fish,flaxseed oil-e.prim-bcurr 400-400-200 mg Cap Take 1 capsule by mouth once daily     fluticasone propionate (FLONASE) 50 mcg/actuation nasal spray Place 2 sprays into both nostrils once daily as needed     loratadine (CLARITIN) 10 mg tablet Take 10 mg by mouth once as needed     meloxicam (MOBIC) 7.5 MG tablet Take 1 tablet (7.5 mg total) by mouth once daily 30 tablet 5   rosuvastatin (CRESTOR) 5 MG tablet Take 1 tablet by mouth 3 (three) times daily     triamterene-hydrochlorothiazide (MAXZIDE-25) 37.5-25 mg tablet Take 1  tablet by mouth once daily     docusate (COLACE) 100 MG capsule Take 100 mg by mouth 2 (two) times daily (Patient not taking: Reported on 09/20/2021)     HYDROcodone-acetaminophen (NORCO) 5-325 mg tablet Take 1-2 tablets by mouth every 4 (four) hours as needed (Patient not taking: Reported on 01/16/2022) 40 tablet 0   ketoconazole (NIZORAL) 2 % cream  Apply topically once daily     olopatadine (PATANOL) 0.1 % ophthalmic solution Place 1 drop into both eyes 2 (two) times daily (Patient not taking: Reported on 09/20/2021)     tobramycin (TOBREX) 0.3 % ophthalmic solution Place 2 drops into both eyes every 6 (six) hours (Patient not taking: Reported on 09/20/2021)     No current facility-administered medications for this visit.    ALLERGIES: Altace [ramipril]  PAST MEDICAL HISTORY: Past Medical History:  Diagnosis Date   Allergic rhinitis    Breast cancer (CMS-HCC) 12/2021   Left   Diabetes mellitus without complication (CMS-HCC)    Family history of breast cancer    Hyperlipidemia    Hypertension    Left Achilles tendinitis    Onychomycosis of multiple toenails with type 2 diabetes mellitus     Varicose veins of both lower extremities    Vitamin D deficiency     PAST SURGICAL HISTORY: Past Surgical History:  Procedure Laterality Date   BREAST EXCISIONAL BIOPSY Left 1968   foot surgery  2004   BREAST EXCISIONAL BIOPSY Right 2007   COLONOSCOPY  2011   BREAST EXCISIONAL BIOPSY Right 2015   3 stereo biopsies-2 benign, 1 radialscar-no excision done for radial scar   heel spur Left 12/15/2014   INCISIONAL BIOPSY BREAST Left 07/16/2017   BREAST EXCISIONAL BIOPSY Left 08/23/2020   BREAST EXCISIONAL BIOPSY Left 10/07/2020   Dr Bary Castilla   ARTHROPLASTY TOTAL KNEE Left 06/08/2021   By Dr. Rudene Christians   bunionectomy       FAMILY HISTORY: Family History  Problem Relation Age of Onset   Arthritis Mother    Breast cancer Sister 66   Kidney cancer Brother 53   Cancer Maternal Grandmother        gallbladder   Stroke Paternal Grandmother      SOCIAL HISTORY: Social History   Socioeconomic History   Marital status: Married  Tobacco Use   Smoking status: Never   Smokeless tobacco: Never  Vaping Use   Vaping Use: Never used  Substance and Sexual Activity   Alcohol use: Never   Drug use: Defer   Sexual activity: Defer     PHYSICAL EXAM: Vitals:   01/16/22 0920  BP: (!) 164/80  Pulse: 89   Body mass index is 30.85 kg/m. Weight: 89.4 kg (197 lb)   GENERAL: Alert, active, oriented x3  HEENT: Pupils equal reactive to light. Extraocular movements are intact. Sclera clear. Palpebral conjunctiva normal red color.Pharynx clear.  NECK: Supple with no palpable mass and no adenopathy.  LUNGS: Sound clear with no rales rhonchi or wheezes.  HEART: Regular rhythm S1 and S2 without murmur.  BREAST: breasts appear normal, no suspicious masses, no skin or nipple changes or axillary nodes.  ABDOMEN: Soft and depressible, nontender with no palpable mass, no hepatomegaly.  EXTREMITIES: Well-developed well-nourished symmetrical with no dependent edema.  NEUROLOGICAL: Awake alert oriented, facial expression symmetrical, moving all extremities.  REVIEW OF DATA: I have reviewed the following data today: No visits with results within 3 Month(s) from this visit.  Latest known visit with results is:  Appointment  on 05/02/2021  Component Date Value   See Scanned Result 05/02/2021 yes      ASSESSMENT: Ms. Notarianni is a 77 y.o. female presenting for consultation for left breast cancer.    Patient was oriented again about the pathology results. Surgical alternatives were discussed with patient including partial vs total mastectomy. Surgical technique and post operative care was discussed with patient. Risk of surgery was discussed with patient including but not limited to: wound infection, seroma, hematoma, brachial plexopathy, mondor's disease (thrombosis of small veins of breast), chronic wound pain, breast lymphedema, altered sensation to the nipple and cosmesis among others.   Patient shows to proceed with total mastectomy due to feeling anxious and tired of multiple finding on the left breast.  She  has had multiple core and excisional biopsies on the left breast already.  She understand the high risk of the total  mastectomy with longer recovery compared to partial mastectomy.  She is still want to proceed with total mastectomy.  Malignant neoplasm of upper-inner quadrant of left breast in female, estrogen receptor positive  [C50.212, Z17.0]  PLAN: 1.  Left breast total mastectomy with sentinel node biopsy 2.  Hold aspirin 5 days before surgery 3.  Expect medical oncology appointment today 4.  Contact us if you have any concern  Patient, her husband and her daughter verbalized understanding, all questions were answered, and were agreeable with the plan outlined above.     Herbert Pun, MD  Electronically signed by Herbert Pun, MD

## 2022-01-16 NOTE — H&P (View-Only) (Signed)
PATIENT PROFILE: Stacy [name] is a 77 y.o. female who presents to the Clinic for consultation at the request of Dr. Ancil [name] for evaluation of left breast cancer.  PCP:  Stacy Chance, MD  HISTORY OF PRESENT ILLNESS: Stacy [name] reports she had her usual screening mammogram on 11/22/2021.  Screening mammogram shows suspicious mass on the left breast.  This led to diagnostic mammogram.  Diagnostic mammogram and breast ultrasound done on 12/14/2021.  This shows a 1.2 mass of the left breast, hypoechoic, irregular shaped.  Upon chart review, the patient has had multiple excisional biopsy of the left breast.  Last excisional biopsy was done on 10/07/2020.  There was for the phyllodes tumor.  They feel that his tumor was in the the 5 o'clock position.  Final pathology showed benign phyllodes tumor.  Family history of breast cancer: 2 Stacy [name] with breast cancer Family history of other cancers: Stacy [name] with bladder cancer Menarche: Cannot recall Menopause: Cannot recall Used OCP: Yes Used estrogen and progesterone therapy: No History of Radiation to the chest: No Number of pregnancies: 1 Age of pregnancies: 29 Previous breast biopsies: Multiple bilateral breast needle and excisional biopsies.  No previous malignancy.  PROBLEM LIST: Problem List  Never Reviewed          Noted   S/P TKR (total knee replacement) using cement, left 06/08/2021   Hyperlipidemia associated with type 2 diabetes mellitus  01/14/2020   Onychomycosis of multiple toenails with type 2 diabetes mellitus  01/23/2019   Diabetes mellitus type 2, diet-controlled (CMS-HCC) 10/07/2014   Hypertension, benign 10/07/2014   Left Achilles tendinitis 10/07/2014   Seasonal allergies 10/07/2014   Varicose veins 10/07/2014   Vitamin D deficiency 10/07/2014   Family history of breast cancer 05/12/2012    GENERAL REVIEW OF SYSTEMS:   General ROS: negative for - chills, fatigue, fever, weight gain or weight loss Allergy and Immunology ROS:  negative for - hives  Hematological and Lymphatic ROS: negative for - bleeding problems or bruising, negative for palpable nodes Endocrine ROS: negative for - heat or cold intolerance, hair changes Respiratory ROS: negative for - cough, shortness of breath or wheezing Cardiovascular ROS: no chest pain or palpitations GI ROS: negative for nausea, vomiting, abdominal pain, diarrhea, constipation Musculoskeletal ROS: negative for - joint swelling or muscle pain Neurological ROS: negative for - confusion, syncope Dermatological ROS: negative for pruritus and rash Psychiatric: negative for anxiety, depression, difficulty sleeping and memory loss  MEDICATIONS: Current Outpatient Medications  Medication Sig Dispense Refill   acetaminophen (TYLENOL) 500 MG tablet Take 1 tablet by mouth every 6 (six) hours as needed     aspirin 81 MG EC tablet Take 81 mg by mouth once daily     cholecalciferol (VITAMIN D3) 1000 unit tablet Take 1,000 Units by mouth once daily     cinnamon bark 500 mg capsule Take 2 capsules by mouth once daily     fish,flaxseed oil-e.prim-bcurr 400-400-200 mg Cap Take 1 capsule by mouth once daily     fluticasone propionate (FLONASE) 50 mcg/actuation nasal spray Place 2 sprays into both nostrils once daily as needed     loratadine (CLARITIN) 10 mg tablet Take 10 mg by mouth once as needed     meloxicam (MOBIC) 7.5 MG tablet Take 1 tablet (7.5 mg total) by mouth once daily 30 tablet 5   rosuvastatin (CRESTOR) 5 MG tablet Take 1 tablet by mouth 3 (three) times daily     triamterene-hydrochlorothiazide (MAXZIDE-25) 37.5-25 mg tablet Take 1  tablet by mouth once daily     docusate (COLACE) 100 MG capsule Take 100 mg by mouth 2 (two) times daily (Patient not taking: Reported on 09/20/2021)     HYDROcodone-acetaminophen (NORCO) 5-325 mg tablet Take 1-2 tablets by mouth every 4 (four) hours as needed (Patient not taking: Reported on 01/16/2022) 40 tablet 0   ketoconazole (NIZORAL) 2 % cream  Apply topically once daily     olopatadine (PATANOL) 0.1 % ophthalmic solution Place 1 drop into both eyes 2 (two) times daily (Patient not taking: Reported on 09/20/2021)     tobramycin (TOBREX) 0.3 % ophthalmic solution Place 2 drops into both eyes every 6 (six) hours (Patient not taking: Reported on 09/20/2021)     No current facility-administered medications for this visit.    ALLERGIES: Altace [ramipril]  PAST MEDICAL HISTORY: Past Medical History:  Diagnosis Date   Allergic rhinitis    Breast cancer (CMS-HCC) 12/2021   Left   Diabetes mellitus without complication (CMS-HCC)    Family history of breast cancer    Hyperlipidemia    Hypertension    Left Achilles tendinitis    Onychomycosis of multiple toenails with type 2 diabetes mellitus     Varicose veins of both lower extremities    Vitamin D deficiency     PAST SURGICAL HISTORY: Past Surgical History:  Procedure Laterality Date   BREAST EXCISIONAL BIOPSY Left 1968   foot surgery  2004   BREAST EXCISIONAL BIOPSY Right 2007   COLONOSCOPY  2011   BREAST EXCISIONAL BIOPSY Right 2015   3 stereo biopsies-2 benign, 1 radialscar-no excision done for radial scar   heel spur Left 12/15/2014   INCISIONAL BIOPSY BREAST Left 07/16/2017   BREAST EXCISIONAL BIOPSY Left 08/23/2020   BREAST EXCISIONAL BIOPSY Left 10/07/2020   Dr Bary Castilla   ARTHROPLASTY TOTAL KNEE Left 06/08/2021   By Dr. Rudene Christians   bunionectomy       FAMILY HISTORY: Family History  Problem Relation Age of Onset   Arthritis Stacy [name]    Breast cancer Stacy [name] 85   Kidney cancer Stacy [name] 56   Cancer Maternal Stacy [name]        gallbladder   Stroke Stacy [name]      SOCIAL HISTORY: Social History   Socioeconomic History   Marital status: Married  Tobacco Use   Smoking status: Never   Smokeless tobacco: Never  Vaping Use   Vaping Use: Never used  Substance and Sexual Activity   Alcohol use: Never   Drug use: Defer   Sexual activity: Defer     PHYSICAL EXAM: Vitals:   01/16/22 0920  BP: (!) 164/80  Pulse: 89   Body mass index is 30.85 kg/m. Weight: 89.4 kg (197 lb)   GENERAL: Alert, active, oriented x3  HEENT: Pupils equal reactive to light. Extraocular movements are intact. Sclera clear. Palpebral conjunctiva normal red color.Pharynx clear.  NECK: Supple with no palpable mass and no adenopathy.  LUNGS: Sound clear with no rales rhonchi or wheezes.  HEART: Regular rhythm S1 and S2 without murmur.  BREAST: breasts appear normal, no suspicious masses, no skin or nipple changes or axillary nodes.  ABDOMEN: Soft and depressible, nontender with no palpable mass, no hepatomegaly.  EXTREMITIES: Well-developed well-nourished symmetrical with no dependent edema.  NEUROLOGICAL: Awake alert oriented, facial expression symmetrical, moving all extremities.  REVIEW OF DATA: I have reviewed the following data today: No visits with results within 3 Month(s) from this visit.  Latest known visit with results is:  Appointment  on 05/02/2021  Component Date Value   See Scanned Result 05/02/2021 yes      ASSESSMENT: Ms. Shadowens is a 77 y.o. female presenting for consultation for left breast cancer.    Patient was oriented again about the pathology results. Surgical alternatives were discussed with patient including partial vs total mastectomy. Surgical technique and post operative care was discussed with patient. Risk of surgery was discussed with patient including but not limited to: wound infection, seroma, hematoma, brachial plexopathy, mondor's disease (thrombosis of small veins of breast), chronic wound pain, breast lymphedema, altered sensation to the nipple and cosmesis among others.   Patient shows to proceed with total mastectomy due to feeling anxious and tired of multiple finding on the left breast.  She  has had multiple core and excisional biopsies on the left breast already.  She understand the high risk of the total  mastectomy with longer recovery compared to partial mastectomy.  She is still want to proceed with total mastectomy.  Malignant neoplasm of upper-inner quadrant of left breast in female, estrogen receptor positive  [C50.212, Z17.0]  PLAN: 1.  Left breast total mastectomy with sentinel node biopsy 2.  Hold aspirin 5 days before surgery 3.  Expect medical oncology appointment today 4.  Contact us if you have any concern  Patient, her husband and her daughter verbalized understanding, all questions were answered, and were agreeable with the plan outlined above.     Herbert Pun, MD  Electronically signed by Herbert Pun, MD

## 2022-01-16 NOTE — Assessment & Plan Note (Signed)
Discussed with patient

## 2022-01-16 NOTE — Assessment & Plan Note (Signed)
Refer to genetic counselor.  

## 2022-01-17 LAB — CANCER ANTIGEN 15-3: CA 15-3: 23 U/mL (ref 0.0–25.0)

## 2022-01-17 LAB — CANCER ANTIGEN 27.29: CA 27.29: 31.4 U/mL (ref 0.0–38.6)

## 2022-01-18 ENCOUNTER — Ambulatory Visit (INDEPENDENT_AMBULATORY_CARE_PROVIDER_SITE_OTHER): Payer: Medicare PPO | Admitting: Podiatry

## 2022-01-18 ENCOUNTER — Encounter: Payer: Self-pay | Admitting: Podiatry

## 2022-01-18 DIAGNOSIS — M79675 Pain in left toe(s): Secondary | ICD-10-CM

## 2022-01-18 DIAGNOSIS — E119 Type 2 diabetes mellitus without complications: Secondary | ICD-10-CM | POA: Diagnosis not present

## 2022-01-18 DIAGNOSIS — M79674 Pain in right toe(s): Secondary | ICD-10-CM | POA: Diagnosis not present

## 2022-01-18 DIAGNOSIS — B351 Tinea unguium: Secondary | ICD-10-CM

## 2022-01-18 NOTE — Progress Notes (Signed)
This patient returns to my office for at risk foot care.  This patient requires this care by a professional since this patient will be at risk due to having diabetes.  This patient is unable to cut nails herself since the patient cannot reach her nails.These nails are painful walking and wearing shoes.  This patient presents for at risk foot care today.  General Appearance  Alert, conversant and in no acute stress.  Vascular  Dorsalis pedis and posterior tibial  pulses are palpable  bilaterally.  Capillary return is within normal limits  bilaterally. Temperature is within normal limits  bilaterally.  Neurologic  Senn-Weinstein monofilament wire test within normal limits  bilaterally. Muscle power within normal limits bilaterally.  Nails Thick disfigured discolored nails with subungual debris  from hallux to fifth toes bilaterally. No evidence of bacterial infection or drainage bilaterally.  Orthopedic  No limitations of motion  feet .  No crepitus or effusions noted.  No bony pathology or digital deformities noted.  Skin  normotropic skin with no porokeratosis noted bilaterally.  No signs of infections or ulcers noted.     Onychomycosis  Pain in right toes  Pain in left toes  Consent was obtained for treatment procedures.   Mechanical debridement of nails 1-5  bilaterally performed with a nail nipper.  Filed with dremel without incident. No infection or ulcer.     Return office visit    10 weeks                 Told patient to return for periodic foot care and evaluation due to potential at risk complications.   Gardiner Barefoot DPM

## 2022-01-22 ENCOUNTER — Other Ambulatory Visit: Payer: Self-pay

## 2022-01-22 ENCOUNTER — Encounter
Admission: RE | Admit: 2022-01-22 | Discharge: 2022-01-22 | Disposition: A | Discharge status: Home / Self Care | Payer: Medicare PPO | Source: Ambulatory Visit | Attending: General Surgery | Admitting: General Surgery

## 2022-01-22 ENCOUNTER — Telehealth: Payer: Self-pay

## 2022-01-22 DIAGNOSIS — C50919 Malignant neoplasm of unspecified site of unspecified female breast: Secondary | ICD-10-CM

## 2022-01-22 DIAGNOSIS — Z01812 Encounter for preprocedural laboratory examination: Secondary | ICD-10-CM

## 2022-01-22 HISTORY — DX: Malignant (primary) neoplasm, unspecified: C80.1

## 2022-01-22 HISTORY — DX: Gastro-esophageal reflux disease without esophagitis: K21.9

## 2022-01-22 HISTORY — DX: Dizziness and giddiness: R42

## 2022-01-22 NOTE — Patient Instructions (Addendum)
Your procedure is scheduled on: 01/31/22 - Wednesday Report to the Registration Desk on the 1st floor of the [facility]. If your arrival time is 6:00 am, do not arrive prior to that time as the [facility] entrance doors do not open until 6:00 am.  REMEMBER: Instructions that are not followed completely may result in serious medical risk, up to and including death; or upon the discretion of your surgeon and anesthesiologist your surgery may need to be rescheduled.  Do not eat food or drink any liquids after midnight the night before surgery.  No gum chewing, lozengers or hard candies.  TAKE THESE MEDICATIONS THE MORNING OF SURGERY WITH A SIP OF WATER:  NONE  HOLD Aspirin 81 mg 5 days prior to your procedure.  One week prior to surgery: Stop Anti-inflammatories (NSAIDS) such as Advil, Aleve, Ibuprofen, Motrin, Naproxen, Naprosyn and Aspirin based products such as Excedrin, Goodys Powder, BC Powder.  Stop ANY OVER THE COUNTER supplements until after surgery beginning 01/24/22.Cholecalciferol (VITAMIN D-3 PO), CINNAMON  ,LIDOCAINE-MENTHOL EX , meloxicam (MOBIC) , Multiple Vitamin (MULTIVITAMIN) .  You may however, continue to take Tylenol if needed for pain up until the day of surgery.  No Alcohol for 24 hours before or after surgery.  No Smoking including e-cigarettes for 24 hours prior to surgery.  No chewable tobacco products for at least 6 hours prior to surgery.  No nicotine patches on the day of surgery.  Do not use any "recreational" drugs for at least a week prior to your surgery.  Please be advised that the combination of cocaine and anesthesia may have negative outcomes, up to and including death. If you test positive for cocaine, your surgery will be cancelled.  On the morning of surgery brush your teeth with toothpaste and water, you may rinse your mouth with mouthwash if you wish. Do not swallow any toothpaste or mouthwash.  Use CHG Soap or wipes as directed on  instruction sheet.  Do not wear jewelry, make-up, hairpins, clips or nail polish.  Do not wear lotions, powders, or perfumes.   Do not shave body from the neck down 48 hours prior to surgery just in case you cut yourself which could leave a site for infection. Also, freshly shaved skin may become irritated if using the CHG soap.  Contact lenses, hearing aids and dentures may not be worn into surgery.  Do not bring valuables to the hospital. [facility] is not responsible for any missing/lost belongings or valuables.   Notify your doctor if there is any change in your medical condition (cold, fever, infection).  Wear comfortable clothing (specific to your surgery type) to the hospital.  After surgery, you can help prevent lung complications by doing breathing exercises.  Take deep breaths and cough every 1-2 hours. Your doctor may order a device called an Incentive Spirometer to help you take deep breaths. When coughing or sneezing, hold a pillow firmly against your incision with both hands. This is called "splinting." Doing this helps protect your incision. It also decreases belly discomfort.  If you are being admitted to the hospital overnight, leave your suitcase in the car. After surgery it may be brought to your room.  If you are being discharged the day of surgery, you will not be allowed to drive home. You will need a responsible adult (18 years or older) to drive you home and stay with you that night.   If you are taking public transportation, you will need to have a  responsible adult (18 years or older) with you. Please confirm with your physician that it is acceptable to use public transportation.   Please call the [facility] at [phone] if you have any questions about these instructions.  Surgery Visitation Policy:  Patients undergoing a surgery or procedure may have two family members or support persons with them as long as the person is not  COVID-19 positive or experiencing its symptoms.   Inpatient Visitation:    Visiting hours are 7 a.m. to 8 p.m. Up to four visitors are allowed at one time in a patient room. The visitors may rotate out with other people during the day. One designated support person (adult) may remain overnight.  Due to an increase in RSV and influenza rates and associated hospitalizations, children ages 43 and under will not be able to visit patients in [facility]. Masks continue to be strongly recommended.

## 2022-01-22 NOTE — Telephone Encounter (Signed)
Trial:  [employer] 2021-05 - Specimen Collection Study to Evaluate Biomarkers in Subjects with Cancer   Patient Stacy [name] was identified by this nurse as a potential candidate for the above listed study.  This Clinical Research Nurse called Stacy [name], [id number], on Monday, January 22, 2022   to discuss participation in the above listed research study.  A copy of the informed consent document and separate HIPAA Authorization will be provided to the patient after review on 01/23/2022.  Patient will be provided with the business card of this Nurse and encouraged to contact the research team with any questions.  Approximately 15 minutes were spent with the patient reviewing the informed consent documents briefly. The patient states she would like to participate. Patient schedule for consent and labs reviewed, she will see research after her visit with the Genetic Counselor and then review the protocol consent and have the required labs drawn if she still wants to participate in the protocol. The research labs will be drawn at the same time as the genetic labs to prevent patient from having to endure multiple venous accesses. Patient was in agreement with all details discussed. Stacy [name] and Dr. Tasia Catchings notified via secure chat concerning patients decision and schedule for tomorrow.  Stacy Fruit, RN 01/22/22 2:30 PM

## 2022-01-23 ENCOUNTER — Inpatient Hospital Stay: Payer: Medicare PPO | Admitting: Licensed Clinical Social Worker

## 2022-01-23 ENCOUNTER — Ambulatory Visit: Payer: Medicare PPO

## 2022-01-23 ENCOUNTER — Inpatient Hospital Stay: Payer: Medicare PPO

## 2022-01-23 ENCOUNTER — Encounter
Admission: RE | Admit: 2022-01-23 | Discharge: 2022-01-23 | Disposition: A | Discharge status: Home / Self Care | Payer: Medicare PPO | Source: Ambulatory Visit | Attending: General Surgery | Admitting: General Surgery

## 2022-01-23 DIAGNOSIS — I4510 Unspecified right bundle-branch block: Secondary | ICD-10-CM | POA: Insufficient documentation

## 2022-01-23 DIAGNOSIS — Z803 Family history of malignant neoplasm of breast: Secondary | ICD-10-CM | POA: Diagnosis not present

## 2022-01-23 DIAGNOSIS — Z01818 Encounter for other preprocedural examination: Secondary | ICD-10-CM | POA: Diagnosis not present

## 2022-01-23 DIAGNOSIS — C50919 Malignant neoplasm of unspecified site of unspecified female breast: Secondary | ICD-10-CM | POA: Diagnosis not present

## 2022-01-23 DIAGNOSIS — Z01812 Encounter for preprocedural laboratory examination: Secondary | ICD-10-CM

## 2022-01-23 DIAGNOSIS — Z01810 Encounter for preprocedural cardiovascular examination: Secondary | ICD-10-CM | POA: Diagnosis not present

## 2022-01-23 NOTE — Research (Signed)
[employer] 2021-05 - Specimen Collection Study to Evaluate Biomarkers in Subjects with Cancer     This Coordinator has reviewed this patient's inclusion and exclusion criteria as a second review and confirms Stacy [name] is eligible for study participation.  Patient may continue with enrollment.  [name], [facility] 01/23/2022 2:57 PM

## 2022-01-23 NOTE — Progress Notes (Signed)
REFERRING PROVIDER: Earlie Server, MD [place],  [place] [address]  PRIMARY PROVIDER:  Steele Sizer, MD  PRIMARY REASON FOR VISIT:  1. Invasive carcinoma of breast ([place])   2. Family history of breast cancer      HISTORY OF PRESENT ILLNESS:   [name], a 77 y.o. female, was seen for a [place] cancer genetics consultation at the request of Dr. Tasia Catchings due to a personal and family history of breast cancer.  Ms. [name] presents to clinic today to discuss the possibility of a hereditary predisposition to cancer, genetic testing, and to further clarify her future cancer risks, as well as potential cancer risks for family members.   In 2023, at the age of 20, [name] was diagnosed with invasive carcinoma of the left breast. The treatment plan includes mastectomy planned for 01/31/2022, Oncotype, adjuvant radiation and adjuvant antiestrogen therapy.  CANCER HISTORY:  Oncology History  Invasive carcinoma of breast ([place])  11/21/2021 Mammogram   Bilateral screening mammogram showed In the left breast, a possible mass warrants further evaluation. In the right breast, no findings suspicious for malignancy.   12/14/2021 Mammogram   Left unilateral diagnostic mammogram/ ultrasound showed Suspicious 1.2 cm mass over the 10 o'clock position of the left breast 4 cm from the nipple. No pathologically enlarged Left axilla lymph node    01/12/2022 Initial Diagnosis   Invasive carcinoma of breast   -Left breast lesion ultrasound-guided biopsy showed invasive mammary carcinoma with mucinous features, grade 2, DCIS negative, LVI negative ER 90%, PR 20%, HER2 IHC 2+ equivocal, FISH negative.  Menarche at age of [age] First live birth at age of 35 OCP use: 5 years Menopausal status: Postmenopausal, LMP in 72s. 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  First live birth at age 25.  OCP use for approximately 5 years.  Ovaries intact: yes.  Hysterectomy: no.  Menopausal status: postmenopausal.  HRT use: 0 years. Colonoscopy: yes; normal. Number of breast biopsies: multiple previous bx, previous biopsy in 2022 - phylloid tumor   Past Medical History:  Diagnosis Date   Allergy    Arthritis    Cancer ([place])    GERD (gastroesophageal reflux disease)    Hyperlipidemia    Hypertension    Neoplasm of uncertain behavior of breast 2007   Pre-diabetes    Vertigo     Past Surgical History:  Procedure Laterality Date   BREAST BIOPSY Right 2015   3 stereo biopsies. 2 benign. one was radial scar, no excision done for radial scar   BREAST BIOPSY Left 07/16/2017   BIPHASIC STROMAL AND EPITHELIAL LESION./ Dr Bary Castilla   BREAST BIOPSY Left 08/23/2020   [place] bx of mass 5:00 2cmfn, coil marker, FIBROEPITHELIAL LESION WITH INTRACANALICULAR GROWTH PATTERN AND VARIABLE STROMAL HYPERCELLULARITY   BREAST BIOPSY Left 01/02/2022   [place] bx path pending 10:00 ribbon clip   BREAST BIOPSY Left 01/02/2022   [place] LT BREAST BX W LOC DEV 1ST LESION IMG BX [place] US GUIDE 01/02/2022 [facility]  BREAST EXCISIONAL BIOPSY Left 1968   x 3 per pt   BREAST EXCISIONAL BIOPSY Right 2007   benign   BUNIONECTOMY Left    COLONOSCOPY  2011   DR,ISHAKIS   EXCISION OF BREAST LESION Left 10/07/2020   Procedure: EXCISION OF BREAST LESION;  Surgeon: Robert Bellow, MD;  Location: [facility] ORS;  Service: General;  Laterality: Left; FIBROEPITHELIAL LESION WITH NTRACANALICULAR GROWTH PATTERN AND VARIABLE STROMAL   HAMMER TOE  SURGERY Left    2nd toe   HEEL SPUR SURGERY Left 12/15/2014   Dr. Earleen Newport at [place] Right    TOTAL KNEE ARTHROPLASTY Left 06/08/2021   Procedure: TOTAL KNEE ARTHROPLASTY;  Surgeon: Hessie Knows, MD;  Location: [facility] ORS;  Service: Orthopedics;  Laterality: Left;    FAMILY HISTORY:  We obtained a detailed, 4-generation family history.  Significant diagnoses are listed below: Family History  Problem Relation Age of Onset   Stroke Mother    Arthritis Mother    Breast cancer Sister 20   Breast cancer Sister    Cancer Brother 73       kidney   Kidney cancer Brother    Cancer Maternal Grandmother        Gallbladder   Stroke Paternal Grandmother    [name] has 2 sisters, 1 brother. One sister had breast cancer at 26 and is living at 58. Another sister had breast cancer in her 7s-60s and is living at 2. Her brother passed at 65, he had kidney cancer.  [name] mother died at 74. Maternal grandmother had gallbladder cancer and passed in her 78s.   [name] father died in his 104s, no known cancers on his side of the family.  [name] is unaware of previous family history of genetic testing for hereditary cancer risks. There is no reported Ashkenazi Jewish ancestry. There is no known consanguinity.   GENETIC COUNSELING ASSESSMENT: [name] is a 77 y.o. female with a personal and family history of breast cancer which is somewhat suggestive of a hereditary cancer syndrome and predisposition to cancer. We, therefore, discussed and recommended the following at today's visit.   DISCUSSION: We discussed that approximately 10% of breast cancer is hereditary. Most cases of hereditary breast cancer are associated with BRCA1/BRCA2 genes, although there are other genes associated with hereditary cancer as well. Cancers and risks are gene specific. We discussed that testing is beneficial for several reasons including knowing about cancer risks, identifying  potential screening and risk-reduction options that may be appropriate, and to understand if other family members could be at risk for cancer and allow them to undergo genetic testing.   We reviewed the characteristics, features and inheritance patterns of hereditary cancer syndromes. We also discussed genetic testing, including the appropriate family members to test, the process of testing, insurance coverage and turn-around-time for results. We discussed the implications of a negative, positive and/or variant of uncertain significant result. We recommended Ms. Sherral Hammers pursue genetic testing for the [facility] Multi-Cancer+RNA gene panel.   Based on Ms. Trembley's personal and family history of cancer, she meets medical criteria for genetic testing. Despite that she meets criteria, she may still have an out of pocket cost. We discussed that if her out of pocket cost for testing is over $100, the laboratory will call and confirm whether she wants to proceed with testing.  If the out of pocket cost of testing is less than $100 she will be billed by the  genetic testing laboratory.   PLAN: After considering the risks, benefits, and limitations, Ms. Sherral Hammers provided informed consent to pursue genetic testing and the blood sample was sent to [facility] for analysis of the Multi-Cancer+RNA panel. Results should be available within approximately 2-3 weeks' time, at which point they will be disclosed by telephone to Ms. Pascual, as will any additional recommendations warranted by these results. Ms. Borrayo will receive a summary of her genetic counseling visit and a copy of her results once available. This information will also be available in Epic.   Ms. Antonucci questions were answered to her satisfaction today. Our contact information was provided should additional questions or concerns arise. Thank you for the referral and allowing [place] to share in the care of your patient.   Faith Rogue, MS, [facility] Genetic  Counselor [email or web address] Phone: [phone]  The patient was seen for a total of 30 minutes in face-to-face genetic counseling. Patient was seen with her husband.  Dr. Grayland Ormond was available for discussion regarding this case.   _______________________________________________________________________ For Office Staff:  Number of people involved in session: 3 Was an Intern/ student involved with case: yes - prospective genetic counseling student, Camelia Phenes, was present for observation.

## 2022-01-23 NOTE — Research (Addendum)
Trial Name:  [employer] 2021-05 - Specimen Collection Study to Evaluate Biomarkers in Subjects with Cancer    Patient Stacy [name] was identified by this nurse as a potential candidate for the above listed study.  This Clinical Research Nurse met with Stacy [name], [id number] on 01/23/22 in a manner and location that ensures patient privacy to discuss participation in the above listed research study.  Patient is Accompanied by her husband .  Patient was previously provided with informed consent documents.  Patient confirmed they have read the informed consent documents.  As outlined in the informed consent form, this Nurse and Stacy [name] discussed the purpose of the research study, the investigational nature of the study, study procedures and requirements for study participation, potential risks and benefits of study participation, as well as alternatives to participation.  This study is not blinded or double-blinded. The patient understands participation is voluntary and they may withdraw from study participation at any time.  This study does not involve randomization.  This study does not involve an investigational drug or device. This study does not involve a placebo. Patient understands enrollment is pending full eligibility review.   Confidentiality and how the patient's information will be used as part of study participation were discussed.  Patient was informed there is reimbursement provided for their time and effort spent on trial participation.  The patient is encouraged to discuss research study participation with their insurance provider to determine what costs they may incur as part of study participation, including research related injury.    All questions were answered to patient's satisfaction.  The informed consent and separate HIPAA Authorization was reviewed page by page.  The patient's mental and emotional status is appropriate to provide informed consent, and the  patient verbalizes an understanding of study participation.  Patient has agreed to participate in the above listed research study and has voluntarily signed the informed consent version 14 dated 02/13/2021 and separate HIPAA Authorization, version 14  on 01/23/22 at 122PM.  The patient was provided with a copy of the signed informed consent form and separate HIPAA Authorization for their reference.  No study specific procedures were obtained prior to the signing of the informed consent document.  Approximately 25 minutes were spent with the patient reviewing the informed consent documents.  After obtaining informed consent patient, voluntarily signed the optional Release of Information form for use throughout trial participation.  Eligibility: Eligibility criteria reviewed with patient. This Nurse has reviewed this patient's inclusion and exclusion criteria and confirmed patient is eligible for study participation. Eligibility confirmed by treating investigator, who also agrees that patient should proceed with enrollment. Second research eligibility review completed by [name], CRC. Patient will continue with enrollment. Blood Collection: Research blood obtained by [name] via [facility] venipuncture as patient does not have a port currently. Patient tolerated well without any adverse events. Gift Card: $50 gift card given to patient for her participation in this study.  Medical History:  High Blood Pressure  No Coronary Artery Disease No Lupus    No Rheumatoid Arthritis  Yes Diabetes   No      If yes, which type?      N/A  Lynch Syndrome  No  Is the patient currently taking a magnesium supplement?   No If yes, dose and frequency? N/A  Indication? N/A Start date? N/A   Does the patient have a personal history of cancer (greater than 5 years ago)?  No If yes, Cancer type and  date of diagnosis?   N/A  Has this previous diagnosis been treated? N/A       If so, treatment type? N/A   Start and  end dates of last treatment cycle? N/A  Does the patient have a family history of cancer in 1st or 2nd degree relatives? Yes If yes, Relationship(s) and Cancer type(s)? Sister-Breast Sister-Breast Brother-Kidney Grandmother Bladder  Does the patient have history of alcohol consumption? No   If yes, current or former? N/A  If former, year stopped? N/A  Number of years? N/A  Drinks per week? N/A  Does the patient have history of cigarette, cigar, pipe, or chewing tobacco use?  Yes  If yes, current for former? Former If yes, type (Cigarette, cigar, pipe, and/or chewing tobacco)? Cigarettes   If former, year stopped? 36 years ago1969 Number of years? 1 Packs/number/containers per day? 3-4 cigarettes per day Jeral Fruit, RN 01/23/22 2:07 PM

## 2022-01-24 NOTE — Progress Notes (Unsigned)
Name: Stacy [name]   MRN: [id number]    DOB: 06-Mar-1945   Date:01/25/2022       Progress Note  Subjective  Chief Complaint  Follow Up  HPI  Invasive carcinoma of the left breast: mastectomy is scheduled for 01/31/2022 with Dr. Tildon Husky, already seen by Dr. Tasia Catchings, and waiting for surgery to decide if chemo necessary  DMII: doing well, on diet only, can't take ACE caused angioedema, last urine micro negative   Denies polyphagia, polydipsia or polyuria.  She is following a diabetic diet. She has been compliant with Crestor 5 mg M, W and Fridays , last LDL was at goal at 41  A1C still at goal. Eye exam is up to date    HTN: taking triamterene hctz, BP today is slightly elevated , she has only been taking half pill but bp has been spiking, go back to full pill since bp has been elevated lately. Denies chestpain, dizziness, palpitation, orthopnea  or SOB, she has chronic lower extremity swelling , she states better with compression stocking hoses , she saw vascular surgeon and was advised to continue compression stocking hoses   Malnutrition: her weight used to be in the high 190 lbs , however she states after knee replacement done in May 2023 she went to rehab, took antibiotics and had lack of appetite. Weight is down a few pounds, discussed high protein diet due to new diagnosis of cancer   Vitamin D deficiency: continue supplementation  AR: she only taking medication prn, denies rhinorrhea, nasal congestion or eye pruritis   Patient Active Problem List   Diagnosis Date Noted   Invasive carcinoma of breast ([place]) 01/16/2022   Goals of care, counseling/discussion 01/16/2022   S/P TKR (total knee replacement) using cement, left 06/08/2021   Hyperlipidemia associated with type 2 diabetes mellitus ([place]) 01/14/2020   Onychomycosis of multiple toenails with type 2 diabetes mellitus ([place]) 01/23/2019   Pain due to onychomycosis of toenails of both feet 10/06/2018   Abnormal EKG 12/10/2016   Radial  scar of breast 05/30/2015   History of foot surgery 12/15/2014   Hypertension, benign 10/07/2014   Seasonal allergies 10/07/2014   Diabetes mellitus type 2, diet-controlled ([place]) 10/07/2014   Vitamin D deficiency 10/07/2014   Varicose veins 10/07/2014   Family history of breast cancer 05/12/2012    Past Surgical History:  Procedure Laterality Date   BREAST BIOPSY Right 2015   3 stereo biopsies. 2 benign. one was radial scar, no excision done for radial scar   BREAST BIOPSY Left 07/16/2017   BIPHASIC STROMAL AND EPITHELIAL LESION./ Dr Bary Castilla   BREAST BIOPSY Left 08/23/2020   [place] bx of mass 5:00 2cmfn, coil marker, FIBROEPITHELIAL LESION WITH INTRACANALICULAR GROWTH PATTERN AND VARIABLE STROMAL HYPERCELLULARITY   BREAST BIOPSY Left 01/02/2022   [place] bx path pending 10:00 ribbon clip   BREAST BIOPSY Left 01/02/2022   [place] LT BREAST BX W LOC DEV 1ST LESION IMG BX SPEC US GUIDE 01/02/2022 [facility]   BREAST EXCISIONAL BIOPSY Left 1968   x 3 per pt   BREAST EXCISIONAL BIOPSY Right 2007   benign   BUNIONECTOMY Left    COLONOSCOPY  2011   DR,ISHAKIS   EXCISION OF BREAST LESION Left 10/07/2020   Procedure: EXCISION OF BREAST LESION;  Surgeon: Robert Bellow, MD;  Location: [facility] ORS;  Service: General;  Laterality: Left; FIBROEPITHELIAL LESION WITH NTRACANALICULAR GROWTH PATTERN AND VARIABLE STROMAL   HAMMER TOE SURGERY Left    2nd toe  HEEL SPUR SURGERY Left 12/15/2014   Dr. Earleen Newport at [place] Right    TOTAL KNEE ARTHROPLASTY Left 06/08/2021   Procedure: TOTAL KNEE ARTHROPLASTY;  Surgeon: Hessie Knows, MD;  Location: [facility] ORS;  Service: Orthopedics;  Laterality: Left;    Family History  Problem Relation Age of Onset   Stroke Mother    Arthritis Mother    Breast cancer Sister 69   Breast cancer Sister    Cancer Brother 60       kidney   Kidney cancer Brother    Cancer Maternal Grandmother        Gallbladder   Stroke Paternal Grandmother      Social History   Tobacco Use   Smoking status: Former    Packs/day: 0.25    Years: 2.00    Total pack years: 0.50    Types: Cigarettes    Quit date: 1974    Years since quitting: 50.0   Smokeless tobacco: Never   Tobacco comments:    smoking cessation materials not required  Substance Use Topics   Alcohol use: No    Alcohol/week: 0.0 standard drinks of alcohol     Current Outpatient Medications:    acetaminophen (TYLENOL) 500 MG tablet, Take 500-1,000 mg by mouth every 6 (six) hours as needed (pain.)., Disp: , Rfl:    aspirin 81 MG EC tablet, Take 81 mg by mouth in the morning., Disp: , Rfl:    Cholecalciferol (VITAMIN D-3 PO), Take 2,000 Units by mouth in the morning., Disp: , Rfl:    CINNAMON PO, Take 1,000 mg by mouth in the morning., Disp: , Rfl:    FISH OIL-BORAGE-FLAX-SAFFLOWER PO, Take 1 capsule by mouth in the morning., Disp: , Rfl:    fluticasone (FLONASE) 50 MCG/ACT nasal spray, Place 2 sprays into both nostrils daily. (Patient taking differently: Place 2 sprays into both nostrils as needed.), Disp: 48 g, Rfl: 1   LIDOCAINE-MENTHOL EX, Apply 1 application  topically 3 (three) times daily as needed (osteoarthritis pain.). Baker's Best Maximum Strength Topical Pain Relief Cream (4 % Lidocaine/Menthol), Disp: , Rfl:    loratadine (CLARITIN) 10 MG tablet, Take 1 tablet (10 mg total) by mouth daily. (Patient taking differently: Take 10 mg by mouth daily as needed for allergies.), Disp: 90 tablet, Rfl: 1   meloxicam (MOBIC) 7.5 MG tablet, Take 7.5 mg by mouth as needed., Disp: , Rfl:    Multiple Vitamin (MULTIVITAMIN) capsule, Take 1 capsule by mouth daily., Disp: , Rfl:    rosuvastatin (CRESTOR) 5 MG tablet, Take 1 tablet (5 mg total) by mouth every Monday, Wednesday, and Friday., Disp: 36 tablet, Rfl: 3   triamterene-hydrochlorothiazide (MAXZIDE-25) 37.5-25 MG tablet, Take 0.5-1 tablets by mouth daily. (Patient taking differently: Take 0.5 tablets by mouth daily.), Disp: 90  tablet, Rfl: 1  Allergies  Allergen Reactions   Altace [Ramipril] Swelling and Other (See Comments)    lips    I personally reviewed active problem list, medication list, allergies, family history, social history, health maintenance with the patient/caregiver today.   ROS  Constitutional: Negative for fever or weight change.  Respiratory: Negative for cough and shortness of breath.   Cardiovascular: Negative for chest pain or palpitations.  Gastrointestinal: Negative for abdominal pain, no bowel changes.  Musculoskeletal: positive  for gait problem and left  joint swelling.  Skin: Negative for rash.  Neurological: Negative for dizziness or headache.  No other specific complaints in a complete review of  systems (except as listed in HPI above).   Objective  Vitals:   01/25/22 0910  BP: (!) 142/72  Pulse: 92  Resp: 18  Temp: 97.9 F (36.6 C)  TempSrc: Oral  SpO2: 99%  Weight: 191 lb 14.4 oz (87 kg)  Height: '5\' 8"'$  (1.727 m)    Body mass index is 29.18 kg/m.  Physical Exam  Constitutional: Patient appears well-developed and well-nourished.  No distress.  HEENT: head atraumatic, normocephalic, pupils equal and reactive to light, neck supple Cardiovascular: Normal rate, regular rhythm and normal heart sounds.  No murmur heard. No BLE edema. Pulmonary/Chest: Effort normal and breath sounds normal. No respiratory distress. Abdominal: Soft.  There is no tenderness. Muscular skeletal: decrease rom of left knee, some effusion Psychiatric: Patient has a normal mood and affect. behavior is normal. Judgment and thought content normal.    PHQ2/9:    01/25/2022    9:12 AM 09/14/2021   10:49 AM 01/02/2021   10:52 AM 12/22/2020   11:26 AM 07/26/2020   11:06 AM  Depression screen PHQ 2/9  Decreased Interest 0 0 0 0 0  Down, Depressed, Hopeless 0 0 0 0 0  PHQ - 2 Score 0 0 0 0 0  Altered sleeping 0 0 0    Tired, decreased energy 0 0 0    Change in appetite 0 0 0    Feeling  bad or failure about yourself  0 0 0    Trouble concentrating 0 0 0    Moving slowly or fidgety/restless 0 0 0    Suicidal thoughts 0 0 0    PHQ-9 Score 0 0 0    Difficult doing work/chores  Not difficult at all       phq 9 is negative   Fall Risk:    01/25/2022    9:12 AM 09/14/2021   10:49 AM 01/02/2021   10:51 AM 12/22/2020   11:27 AM 07/26/2020   11:06 AM  Fall Risk   Falls in the past year? 0 0 0 0 0  Number falls in past yr:  0 0 0 0  Injury with Fall?  0 0 0 0  Risk for fall due to : No Fall Risks No Fall Risks No Fall Risks No Fall Risks   Follow up Falls prevention discussed;Education provided;Falls evaluation completed Falls prevention discussed;Education provided Falls prevention discussed Falls prevention discussed      Assessment & Plan  1. Hyperlipidemia associated with type 2 diabetes mellitus ([facility])  - POCT HgB A1C  2. Dyslipidemia associated with type 2 diabetes mellitus ([place])  Continue statin therapy   3. Mild protein-calorie malnutrition ([place])  Discussed high calorie diet   4. Invasive carcinoma of breast ([place])  Surgery schedule   5. S/P TKR (total knee replacement) using cement, left   6. Hypertension, benign  Bp elevated, go back to full pill daily   7. Primary osteoarthritis of left knee  May need to resume PT   8. Vitamin D deficiency  Continue supplementation

## 2022-01-25 ENCOUNTER — Ambulatory Visit (INDEPENDENT_AMBULATORY_CARE_PROVIDER_SITE_OTHER): Payer: Medicare PPO | Admitting: Family Medicine

## 2022-01-25 ENCOUNTER — Encounter: Payer: Self-pay | Admitting: Family Medicine

## 2022-01-25 VITALS — BP 142/72 | HR 92 | Temp 97.9°F | Resp 18 | Ht 68.0 in | Wt 191.9 lb

## 2022-01-25 DIAGNOSIS — Z96652 Presence of left artificial knee joint: Secondary | ICD-10-CM | POA: Diagnosis not present

## 2022-01-25 DIAGNOSIS — E785 Hyperlipidemia, unspecified: Secondary | ICD-10-CM

## 2022-01-25 DIAGNOSIS — E1169 Type 2 diabetes mellitus with other specified complication: Secondary | ICD-10-CM | POA: Diagnosis not present

## 2022-01-25 DIAGNOSIS — M1712 Unilateral primary osteoarthritis, left knee: Secondary | ICD-10-CM

## 2022-01-25 DIAGNOSIS — I152 Hypertension secondary to endocrine disorders: Secondary | ICD-10-CM

## 2022-01-25 DIAGNOSIS — C50919 Malignant neoplasm of unspecified site of unspecified female breast: Secondary | ICD-10-CM

## 2022-01-25 DIAGNOSIS — Z1211 Encounter for screening for malignant neoplasm of colon: Secondary | ICD-10-CM

## 2022-01-25 DIAGNOSIS — E441 Mild protein-calorie malnutrition: Secondary | ICD-10-CM

## 2022-01-25 DIAGNOSIS — E1159 Type 2 diabetes mellitus with other circulatory complications: Secondary | ICD-10-CM | POA: Diagnosis not present

## 2022-01-25 DIAGNOSIS — I10 Essential (primary) hypertension: Secondary | ICD-10-CM | POA: Diagnosis not present

## 2022-01-25 DIAGNOSIS — E559 Vitamin D deficiency, unspecified: Secondary | ICD-10-CM | POA: Diagnosis not present

## 2022-01-25 LAB — POCT GLYCOSYLATED HEMOGLOBIN (HGB A1C): Hemoglobin A1C: 5.8 % — AB (ref 4.0–5.6)

## 2022-01-30 ENCOUNTER — Ambulatory Visit (INDEPENDENT_AMBULATORY_CARE_PROVIDER_SITE_OTHER): Payer: Medicare PPO | Admitting: Physician Assistant

## 2022-01-30 VITALS — BP 160/76 | HR 90 | Temp 98.1°F | Resp 16 | Ht 68.0 in | Wt 192.2 lb

## 2022-01-30 DIAGNOSIS — Z0000 Encounter for general adult medical examination without abnormal findings: Secondary | ICD-10-CM

## 2022-01-30 MED ORDER — ORAL CARE MOUTH RINSE: 15.0000 mL | Freq: Once | OROMUCOSAL | Status: AC

## 2022-01-30 MED ORDER — CEFAZOLIN SODIUM-DEXTROSE 2-4 GM/100ML-% IV SOLN: 2.0000 g | INTRAVENOUS | Status: DC

## 2022-01-30 MED ORDER — CHLORHEXIDINE GLUCONATE 0.12 % MT SOLN: 15.0000 mL | Freq: Once | OROMUCOSAL | Status: AC

## 2022-01-30 MED ORDER — LACTATED RINGERS IV SOLN: INTRAVENOUS | Status: DC

## 2022-01-30 NOTE — Progress Notes (Signed)
Annual Wellness Visit   Subjective:   Stacy [name] is a 77 y.o. female who presents for Medicare Annual (Subsequent) preventive examination.  Today's Provider: Talitha Givens, MHS, PA-C Introduced myself to the patient as a PA-C and provided education on APPs in clinical practice.    Review of Systems:  She denies concerns today   Cardiac Risk Factors include: advanced age (>75mn, >>64women);hypertension;dyslipidemia     Objective:     Vitals: BP (!) 160/76 Comment: has not taken pill this am  Pulse 90   Temp 98.1 F (36.7 C) (Oral)   Resp 16   Ht '5\' 8"'$  (1.727 m)   Wt 192 lb 3.2 oz (87.2 kg)   SpO2 97%   BMI 29.22 kg/m   Body mass index is 29.22 kg/m.     01/22/2022   11:23 AM 01/16/2022   10:53 AM 06/08/2021    2:14 PM 05/30/2021   10:20 AM 12/22/2020   11:26 AM 10/07/2020    6:32 AM 09/27/2020    9:50 AM  Advanced Directives  Does Patient Have a Medical Advance Directive? No No No No No No No  Would patient like information on creating a medical advance directive?   No - Patient declined No - Patient declined Yes (MAU/Ambulatory/Procedural Areas - Information given) No - Patient declined No - Patient declined    Tobacco Social History   Tobacco Use  Smoking Status Former   Packs/day: 0.25   Years: 2.00   Total pack years: 0.50   Types: Cigarettes   Quit date: 110  Years since quitting: 50.0  Smokeless Tobacco Never  Tobacco Comments   smoking cessation materials not required     Counseling given: Not Answered Tobacco comments: smoking cessation materials not required   Clinical Intake:  Pre-visit preparation completed: Yes  Pain : No/denies pain Pain Score: 0-No pain     Nutritional Status: BMI 25 -29 Overweight Nutritional Risks: None Diabetes: Yes CBG done?: No Did pt. bring in CBG monitor from home?: No  How often do you need to have someone help you when you read instructions, pamphlets, or other written materials from your  doctor or pharmacy?: 1 - Never What is the last grade level you completed in school?: 12th grade  Interpreter Needed?: No     Past Medical History:  Diagnosis Date   Allergy    Arthritis    [place]    GERD (gastroesophageal reflux disease)    Hyperlipidemia    Hypertension    Neoplasm of uncertain behavior of breast 2007   Pre-diabetes    Vertigo    Past Surgical History:  Procedure Laterality Date   BREAST BIOPSY Right 2015   3 stereo biopsies. 2 benign. one was radial scar, no excision done for radial scar   BREAST BIOPSY Left 07/16/2017   BIPHASIC STROMAL AND EPITHELIAL LESION./ Dr BBary Castilla  BREAST BIOPSY Left 08/23/2020   uKoreabx of mass 5:00 2cmfn, coil marker, FIBROEPITHELIAL LESION WITH INTRACANALICULAR GROWTH PATTERN AND VARIABLE STROMAL HYPERCELLULARITY   BREAST BIOPSY Left 01/02/2022   uKoreabx path pending 10:00 ribbon clip   BREAST BIOPSY Left 01/02/2022   [place]LT BREAST BX W LOC DEV 1ST LESION IMG BX SPEC [place]IDE 01/02/2022 [facility]   BREAST EXCISIONAL BIOPSY Left 1968   x 3 per pt   BREAST EXCISIONAL BIOPSY Right 2007   benign   BUNIONECTOMY Left    COLONOSCOPY  2011   DR,ISHAKIS   EXCISION OF BREAST LESION Left 10/07/2020   Procedure: EXCISION OF BREAST LESION;  Surgeon: Robert Bellow, MD;  Location: [facility];  Service: General;  Laterality: Left; FIBROEPITHELIAL LESION WITH NTRACANALICULAR GROWTH PATTERN AND VARIABLE STROMAL   HAMMER TOE SURGERY Left    2nd toe   HEEL SPUR SURGERY Left 12/15/2014   Dr. Earleen Newport at [place] Right    TOTAL KNEE ARTHROPLASTY Left 06/08/2021   Procedure: TOTAL KNEE ARTHROPLASTY;  Surgeon: Hessie Knows, MD;  Location: [facility];  Service: Orthopedics;  Laterality: Left;   Family History  Problem Relation Age of Onset   Stroke Mother    Arthritis Mother    Breast cancer Sister 75   Breast cancer Sister    Cancer Brother 47       kidney   Kidney cancer Brother    Cancer Maternal  Grandmother        Gallbladder   Stroke Paternal Grandmother    Social History   Socioeconomic History   Marital status: Married    Spouse name: Richard   Number of children: 1   Years of education: Not on file   Highest education level: 12th grade  Occupational History   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  Tobacco Use   Smoking status: Former    Packs/day: 0.25    Years: 2.00    Total pack years: 0.50    Types: Cigarettes    Quit date: 1974    Years since quitting: 50.0   Smokeless tobacco: Never   Tobacco comments:    smoking cessation materials not required  Vaping Use   Vaping Use: Never used  Substance and Sexual Activity   Alcohol use: No    Alcohol/week: 0.0 standard drinks of alcohol   Drug use: No   Sexual activity: Not Currently    Comment: Husband has prostate issues  Other Topics Concern   Not on file  Social History Narrative   Not on file   Social Determinants of Health   Financial Resource Strain: Low Risk  (02/15/2021)   Overall Financial Resource Strain (CARDIA)    Difficulty of Paying Living Expenses: Not hard at all  Food Insecurity: No Food Insecurity (12/22/2020)   Hunger Vital Sign    Worried About Running Out of Food in the Last Year: Never true    Ran Out of Food in the Last Year: Never true  Transportation Needs: No Transportation Needs (12/22/2020)   PRAPARE - Hydrologist (Medical): No    Lack of Transportation (Non-Medical): No  Physical Activity: Inactive (12/22/2020)   Exercise Vital Sign    Days of Exercise per Week: 0 days    Minutes of Exercise per Session: 0 min  Stress: No Stress Concern Present (12/22/2020)   [place]    Feeling of Stress : Not at all  Social Connections: [place] (12/22/2020)   Social Connection and Isolation Panel [NHANES]    Frequency of Communication with Friends and Family: More than three  times a week    Frequency of Social Gatherings with Friends and Family: Once a week    Attends Religious Services: More than 4 times per year    Active Member of [employer] or Organizations: Yes    Attends Archivist Meetings: 1 to 4 times per year  Marital Status: Married    Outpatient Encounter Medications as of 01/30/2022  Medication Sig   acetaminophen (TYLENOL) 500 MG tablet Take 500-1,000 mg by mouth every 6 (six) hours as needed (pain.).   aspirin 81 MG EC tablet Take 81 mg by mouth in the morning.   Cholecalciferol (VITAMIN D-3 PO) Take 2,000 Units by mouth in the morning.   CINNAMON PO Take 1,000 mg by mouth in the morning.   FISH OIL-BORAGE-FLAX-SAFFLOWER PO Take 1 capsule by mouth in the morning.   fluticasone (FLONASE) 50 MCG/ACT nasal spray Place 2 sprays into both nostrils daily. (Patient taking differently: Place 2 sprays into both nostrils as needed.)   LIDOCAINE-MENTHOL EX Apply 1 application  topically 3 (three) times daily as needed (osteoarthritis pain.). [employer]'s Best Maximum Strength Topical Pain Relief Cream (4 % Lidocaine/Menthol)   loratadine (CLARITIN) 10 MG tablet Take 1 tablet (10 mg total) by mouth daily. (Patient taking differently: Take 10 mg by mouth daily as needed for allergies.)   meloxicam (MOBIC) 7.5 MG tablet Take 7.5 mg by mouth as needed.   Multiple Vitamin (MULTIVITAMIN) capsule Take 1 capsule by mouth daily.   rosuvastatin (CRESTOR) 5 MG tablet Take 1 tablet (5 mg total) by mouth every Monday, Wednesday, and Friday.   triamterene-hydrochlorothiazide (MAXZIDE-25) 37.5-25 MG tablet Take 0.5-1 tablets by mouth daily. (Patient taking differently: Take 0.5 tablets by mouth daily.)   No facility-administered encounter medications on file as of 01/30/2022.    Activities of Daily Living    01/30/2022   11:25 AM 01/30/2022   11:01 AM  In your present state of health, do you have any difficulty performing the following activities:  Hearing?  0   Vision?  0  Difficulty concentrating or making decisions?  0  Walking or climbing stairs?  0  Dressing or bathing?  0  Doing errands, shopping?  0  Preparing Food and eating ? N   Using the Toilet? N   In the past six months, have you accidently leaked urine? N   Do you have problems with loss of bowel control? N   Managing your Medications? N   Managing your Finances? N   Housekeeping or managing your Housekeeping? N     Patient Care Team: Steele Sizer, MD as PCP - General (Family Medicine) Idelle Leech, OD as Consulting Physician (Optometry) Bary Castilla, Forest Gleason, MD as Consulting Physician (General Surgery) Germaine Pomfret, [facility] (Pharmacist) Gardiner Barefoot, DPM as Consulting Physician (Podiatry) Daiva Huge, RN as Oncology Nurse Navigator    Assessment:   This is a routine wellness examination for Abrina.  Exercise Activities and Dietary recommendations Current Exercise Habits: The patient does not participate in regular exercise at present, Exercise limited by: orthopedic condition(s)   Goals Addressed   None     Fall Risk:    01/30/2022   11:01 AM 01/25/2022    9:12 AM 09/14/2021   10:49 AM 01/02/2021   10:51 AM 12/22/2020   11:27 AM  Fall Risk   Falls in the past year? 0 0 0 0 0  Number falls in past yr: 0  0 0 0  Injury with Fall? 0  0 0 0  Risk for fall due to : No Fall Risks No Fall Risks No Fall Risks No Fall Risks No Fall Risks  Follow up Falls prevention discussed;Education provided;Falls evaluation completed Falls prevention discussed;Education provided;Falls evaluation completed Falls prevention discussed;Education provided Falls prevention discussed Falls prevention discussed    FALL RISK  PREVENTION PERTAINING TO THE HOME:  Any stairs in or around the home? Yes  If so, are there any without handrails? Yes   Home free of loose throw rugs in walkways, pet beds, electrical cords, etc? Yes  Adequate lighting in your home to reduce risk of falls? Yes    ASSISTIVE DEVICES UTILIZED TO PREVENT FALLS:  Life alert? No  Use of a cane, walker or w/c? Yes  Grab bars in the bathroom? Yes  Shower chair or bench in shower? Yes  Elevated toilet seat or a handicapped toilet? Yes   DME ORDERS:  DME order needed?  No   TIMED UP AND GO:  Was the test performed? Yes .  Length of time to ambulate 10 feet: 15 sec.   GAIT:  Appearance of gait: Gait steady and fast with the use of an assistive device.  Education: Fall risk prevention has been discussed.  Intervention(s) required? No   DME/home health order needed?  No    Depression Screen    01/30/2022   11:01 AM 01/25/2022    9:12 AM 09/14/2021   10:49 AM 01/02/2021   10:52 AM  PHQ 2/9 Scores  PHQ - 2 Score 0 0 0 0  PHQ- 9 Score 0 0 0 0     Cognitive Function        01/30/2022   11:29 AM 10/15/2019    9:41 AM 10/14/2018    9:41 AM 10/11/2017    9:22 AM 10/08/2016   11:07 AM  6CIT Screen  What Year? 0 points 0 points 0 points 0 points 0 points  What month? 0 points 0 points 0 points 0 points 0 points  What time? 0 points 0 points 0 points 0 points 0 points  Count back from 20 0 points 0 points 0 points 0 points 0 points  Months in reverse 0 points 0 points 0 points 0 points 0 points  Repeat phrase 2 points 0 points 2 points 0 points 4 points  Total Score 2 points 0 points 2 points 0 points 4 points    Immunization History  Administered Date(s) Administered   Fluad Quad(high Dose 65+) 02/23/2019, 01/22/2020, 01/02/2021, 10/26/2021   Influenza, High Dose Seasonal PF 10/07/2014, 10/10/2015, 10/11/2016, 10/11/2017   Influenza-Unspecified 03/20/2012, 10/01/2013   Janssen (J&J) SARS-COV-2 Vaccination 03/31/2019   Moderna Sars-Covid-2 Vaccination 03/31/2020   Pneumococcal Conjugate-13 10/07/2014   Pneumococcal Polysaccharide-23 07/27/2009, 10/10/2015   Tdap 07/16/2006   Zoster Recombinat (Shingrix) 02/21/2021, 04/26/2021   Zoster, Live 10/19/2009    Qualifies for Shingles  Vaccine? Completed  Tdap: Although this vaccine is not a covered service during a Wellness Exam, does the patient still wish to receive this vaccine today?  Yes .  Education has been provided regarding the importance of this vaccine. Advised may receive this vaccine at local pharmacy or Health Dept. Aware to provide a copy of the vaccination record if obtained from local pharmacy or Health Dept. Verbalized acceptance and understanding.  Flu Vaccine: Completed  Pneumococcal Vaccine: Completed  Covid-19 Vaccine:  Completed vaccines  Screening Tests Health Maintenance  Topic Date Due   Medicare Annual Wellness (AWV)  12/22/2021   COVID-19 Vaccine (3 - 2023-24 season) 02/10/2022 (Originally 09/15/2021)   HEMOGLOBIN A1C  07/26/2022   OPHTHALMOLOGY EXAM  08/25/2022   Diabetic kidney evaluation - Urine ACR  09/15/2022   COLON CANCER SCREENING ANNUAL FOBT  09/16/2022   MAMMOGRAM  11/22/2022   Diabetic kidney evaluation - eGFR measurement  01/17/2023  FOOT EXAM  01/19/2023   Pneumonia Vaccine 22+ Years old  Completed   INFLUENZA VACCINE  Completed   DEXA SCAN  Completed   Hepatitis C Screening  Completed   Zoster Vaccines- Shingrix  Completed   HPV VACCINES  Aged Out   DTaP/Tdap/Td  Discontinued   COLONOSCOPY (Pts 45-55yr Insurance coverage will need to be confirmed)  Discontinued   Fecal DNA (Cologuard)  Discontinued    Cancer Screenings:  Colorectal Screening: Completed Annual FOB, Repeat every 1 years  Mammogram: Completed 11/21/21. Repeat every year;   Bone Density: Completed 08/03/2020. Results reflect NORMAL  Lung Cancer Screening: (Low Dose CT Chest recommended if Age 76-80years, 30 pack-year currently smoking OR have quit w/in 15years.) does not qualify.     Additional Screening:  Hepatitis C Screening:  Completed 09/28/2011  Vision Screening: Recommended annual ophthalmology exams for early detection of glaucoma and other disorders of the eye. Is the patient up to  date with their annual eye exam?  Yes  Who is the provider or what is the name of the office in which the pt attends annual eye exams? Dr.Nice   Dental Screening: Recommended annual dental exams for proper oral hygiene  Community Resource Referral:  CRR required this visit?  No       Plan:  I have personally reviewed and addressed the Medicare Annual Wellness questionnaire and have noted the following in the patient's chart:  A. Medical and social history B. Use of alcohol, tobacco or illicit drugs  C. Current medications and supplements D. Functional ability and status E.  Nutritional status F.  Physical activity G. Advance directives H. List of other physicians I.  Hospitalizations, surgeries, and ER visits in previous 12 months J.  VJonessuch as hearing and vision if needed, cognitive and depression L. Referrals and appointments   In addition, I have reviewed and discussed with patient certain preventive protocols, quality metrics, and best practice recommendations. A written personalized care plan for preventive services as well as general preventive health recommendations were provided to patient.  Signed,    ETalitha Givens MHS, PA-C [facility]

## 2022-01-30 NOTE — Patient Instructions (Signed)
Stacy [name] , Thank you for taking time to come for your Medicare Wellness Visit. I appreciate your ongoing commitment to your health goals. Please review the following plan we discussed and let me know if I can assist you in the future.   Screening recommendations/referrals: Colonoscopy: Complete an annual FOB every year  Mammogram: Completed in 11/2021, repeat every year Bone Density: Completed 08/03/2020 with normal results. You can repeat this every 2 years.  Recommended yearly ophthalmology/optometry visit for glaucoma screening and checkup Recommended yearly dental visit for hygiene and checkup  Vaccinations: Influenza vaccine: Completed for 2023 flu season  Pneumococcal vaccine: Completed Tdap vaccine: Not up to date, it appears that you have declined to get a booster for this vaccine. If you change your mind please let us know and we can administer it here or you can go to the pharmacy or health department. Shingles vaccine: Completed    Covid-19: Please stay up to date on the vaccine and booster recommendations for your age group.    Advanced directives: We do not have this on file. I have included information on completed advanced directives in your AVS. Please review and let us know if you have questions   Conditions/risks identified: none   Next appointment: Follow up in one year for your annual wellness visit    Preventive Care 65 Years and Older, Female Preventive care refers to lifestyle choices and visits with your health care provider that can promote health and wellness. What does preventive care include? A yearly physical exam. This is also called an annual well check. Dental exams once or twice a year. Routine eye exams. Ask your health care provider how often you should have your eyes checked. Personal lifestyle choices, including: Daily care of your teeth and gums. Regular physical activity. Eating a healthy diet. Avoiding tobacco and drug use. Limiting alcohol  use. Practicing safe sex. Taking low-dose aspirin every day. Taking vitamin and mineral supplements as recommended by your health care provider. What happens during an annual well check? The services and screenings done by your health care provider during your annual well check will depend on your age, overall health, lifestyle risk factors, and family history of disease. Counseling  Your health care provider may ask you questions about your: Alcohol use. Tobacco use. Drug use. Emotional well-being. Home and relationship well-being. Sexual activity. Eating habits. History of falls. Memory and ability to understand (cognition). Work and work Statistician. Reproductive health. Screening  You may have the following tests or measurements: Height, weight, and BMI. Blood pressure. Lipid and cholesterol levels. These may be checked every 5 years, or more frequently if you are over 11 years old. Skin check. Lung cancer screening. You may have this screening every year starting at age 50 if you have a 30-pack-year history of smoking and currently smoke or have quit within the past 15 years. Fecal occult blood test (FOBT) of the stool. You may have this test every year starting at age 61. Flexible sigmoidoscopy or colonoscopy. You may have a sigmoidoscopy every 5 years or a colonoscopy every 10 years starting at age 74. Hepatitis C blood test. Hepatitis B blood test. Sexually transmitted disease (STD) testing. Diabetes screening. This is done by checking your blood sugar (glucose) after you have not eaten for a while (fasting). You may have this done every 1-3 years. Bone density scan. This is done to screen for osteoporosis. You may have this done starting at age 49. Mammogram. This may be done every  1-2 years. Talk to your health care provider about how often you should have regular mammograms. Talk with your health care provider about your test results, treatment options, and if necessary,  the need for more tests. Vaccines  Your health care provider may recommend certain vaccines, such as: Influenza vaccine. This is recommended every year. Tetanus, diphtheria, and acellular pertussis (Tdap, Td) vaccine. You may need a Td booster every 10 years. Zoster vaccine. You may need this after age 55. Pneumococcal 13-valent conjugate (PCV13) vaccine. One dose is recommended after age 58. Pneumococcal polysaccharide (PPSV23) vaccine. One dose is recommended after age 34. Talk to your health care provider about which screenings and vaccines you need and how often you need them. This information is not intended to replace advice given to you by your health care provider. Make sure you discuss any questions you have with your health care provider. Document Released: 01/28/2015 Document Revised: 09/21/2015 Document Reviewed: 11/02/2014 Elsevier Interactive Patient Education  2017 [employer] Prevention in the Home Falls can cause injuries. They can happen to people of all ages. There are many things you can do to make your home safe and to help prevent falls. What can I do on the outside of my home? Regularly fix the edges of walkways and driveways and fix any cracks. Remove anything that might make you trip as you walk through a door, such as a raised step or threshold. Trim any bushes or trees on the path to your home. Use bright outdoor lighting. Clear any walking paths of anything that might make someone trip, such as rocks or tools. Regularly check to see if handrails are loose or broken. Make sure that both sides of any steps have handrails. Any raised decks and porches should have guardrails on the edges. Have any leaves, snow, or ice cleared regularly. Use sand or salt on walking paths during winter. Clean up any spills in your garage right away. This includes oil or grease spills. What can I do in the bathroom? Use night lights. Install grab bars by the toilet and in the  tub and shower. Do not use towel bars as grab bars. Use non-skid mats or decals in the tub or shower. If you need to sit down in the shower, use a plastic, non-slip stool. Keep the floor dry. Clean up any water that spills on the floor as soon as it happens. Remove soap buildup in the tub or shower regularly. Attach bath mats securely with double-sided non-slip rug tape. Do not have throw rugs and other things on the floor that can make you trip. What can I do in the bedroom? Use night lights. Make sure that you have a light by your bed that is easy to reach. Do not use any sheets or blankets that are too big for your bed. They should not hang down onto the floor. Have a firm chair that has side arms. You can use this for support while you get dressed. Do not have throw rugs and other things on the floor that can make you trip. What can I do in the kitchen? Clean up any spills right away. Avoid walking on wet floors. Keep items that you use a lot in easy-to-reach places. If you need to reach something above you, use a strong step stool that has a grab bar. Keep electrical cords out of the way. Do not use floor polish or wax that makes floors slippery. If you must use wax, use non-skid  floor wax. Do not have throw rugs and other things on the floor that can make you trip. What can I do with my stairs? Do not leave any items on the stairs. Make sure that there are handrails on both sides of the stairs and use them. Fix handrails that are broken or loose. Make sure that handrails are as long as the stairways. Check any carpeting to make sure that it is firmly attached to the stairs. Fix any carpet that is loose or worn. Avoid having throw rugs at the top or bottom of the stairs. If you do have throw rugs, attach them to the floor with carpet tape. Make sure that you have a light switch at the top of the stairs and the bottom of the stairs. If you do not have them, ask someone to add them for  you. What else can I do to help prevent falls? Wear shoes that: Do not have high heels. Have rubber bottoms. Are comfortable and fit you well. Are closed at the toe. Do not wear sandals. If you use a stepladder: Make sure that it is fully opened. Do not climb a closed stepladder. Make sure that both sides of the stepladder are locked into place. Ask someone to hold it for you, if possible. Clearly mark and make sure that you can see: Any grab bars or handrails. First and last steps. Where the edge of each step is. Use tools that help you move around (mobility aids) if they are needed. These include: Canes. Walkers. Scooters. Crutches. Turn on the lights when you go into a dark area. Replace any light bulbs as soon as they burn out. Set up your furniture so you have a clear path. Avoid moving your furniture around. If any of your floors are uneven, fix them. If there are any pets around you, be aware of where they are. Review your medicines with your doctor. Some medicines can make you feel dizzy. This can increase your chance of falling. Ask your doctor what other things that you can do to help prevent falls. This information is not intended to replace advice given to you by your health care provider. Make sure you discuss any questions you have with your health care provider. Document Released: 10/28/2008 Document Revised: 06/09/2015 Document Reviewed: 02/05/2014 Elsevier Interactive Patient Education  2017 [employer].

## 2022-01-31 ENCOUNTER — Other Ambulatory Visit: Payer: Self-pay

## 2022-01-31 ENCOUNTER — Encounter
Admission: RE | Disposition: A | Discharge status: Home / Self Care | Payer: Self-pay | Source: Ambulatory Visit | Attending: General Surgery

## 2022-01-31 ENCOUNTER — Ambulatory Visit: Payer: Medicare PPO | Admitting: Urgent Care

## 2022-01-31 ENCOUNTER — Encounter: Payer: Self-pay | Admitting: General Surgery

## 2022-01-31 ENCOUNTER — Observation Stay
Admission: RE | Admit: 2022-01-31 | Discharge: 2022-02-01 | Disposition: A | Discharge status: Home / Self Care | Payer: Medicare PPO | Source: Ambulatory Visit | Attending: General Surgery | Admitting: General Surgery

## 2022-01-31 ENCOUNTER — Observation Stay
Admission: RE | Admit: 2022-01-31 | Discharge: 2022-01-31 | Disposition: A | Discharge status: Home / Self Care | Payer: Medicare PPO | Source: Ambulatory Visit | Attending: General Surgery | Admitting: General Surgery

## 2022-01-31 DIAGNOSIS — Z87891 Personal history of nicotine dependence: Secondary | ICD-10-CM | POA: Diagnosis not present

## 2022-01-31 DIAGNOSIS — C50212 Malignant neoplasm of upper-inner quadrant of left female breast: Principal | ICD-10-CM | POA: Insufficient documentation

## 2022-01-31 DIAGNOSIS — Z79899 Other long term (current) drug therapy: Secondary | ICD-10-CM | POA: Insufficient documentation

## 2022-01-31 DIAGNOSIS — Z7982 Long term (current) use of aspirin: Secondary | ICD-10-CM | POA: Diagnosis not present

## 2022-01-31 DIAGNOSIS — I10 Essential (primary) hypertension: Secondary | ICD-10-CM | POA: Diagnosis not present

## 2022-01-31 DIAGNOSIS — Z170 Estrogen receptor positive status [ER+]: Secondary | ICD-10-CM | POA: Insufficient documentation

## 2022-01-31 DIAGNOSIS — E119 Type 2 diabetes mellitus without complications: Secondary | ICD-10-CM | POA: Diagnosis not present

## 2022-01-31 DIAGNOSIS — C50919 Malignant neoplasm of unspecified site of unspecified female breast: Secondary | ICD-10-CM | POA: Diagnosis present

## 2022-01-31 HISTORY — PX: MASTECTOMY W/ SENTINEL NODE BIOPSY: SHX2001

## 2022-01-31 LAB — GLUCOSE, CAPILLARY
Glucose-Capillary: 99 mg/dL (ref 70–99)
Glucose-Capillary: 113 mg/dL — ABNORMAL HIGH (ref 70–99)

## 2022-01-31 SURGERY — MASTECTOMY WITH SENTINEL LYMPH NODE BIOPSY
Anesthesia: General | Site: Breast | Laterality: Left

## 2022-01-31 MED ORDER — ROCURONIUM BROMIDE 100 MG/10ML IV SOLN
INTRAVENOUS | Status: DC | PRN
  Administered 2022-01-31: 50 mg via INTRAVENOUS

## 2022-01-31 MED ORDER — HYDROMORPHONE HCL 1 MG/ML IJ SOLN
INTRAMUSCULAR | Status: AC
  Filled 2022-01-31: qty 1

## 2022-01-31 MED ORDER — PHENYLEPHRINE 80 MCG/ML (10ML) SYRINGE FOR IV PUSH (FOR BLOOD PRESSURE SUPPORT)
PREFILLED_SYRINGE | INTRAVENOUS | Status: DC | PRN
  Administered 2022-01-31 (×3): 80 ug via INTRAVENOUS
  Administered 2022-01-31: 160 ug via INTRAVENOUS

## 2022-01-31 MED ORDER — ONDANSETRON 4 MG PO TBDP: 4.0000 mg | ORAL_TABLET | Freq: Four times a day (QID) | ORAL | Status: DC | PRN

## 2022-01-31 MED ORDER — ACETAMINOPHEN 10 MG/ML IV SOLN
INTRAVENOUS | Status: AC
  Filled 2022-01-31: qty 100

## 2022-01-31 MED ORDER — BUPIVACAINE-EPINEPHRINE (PF) 0.5% -1:200000 IJ SOLN
INTRAMUSCULAR | Status: DC | PRN
  Administered 2022-01-31: 50 mL

## 2022-01-31 MED ORDER — DEXMEDETOMIDINE HCL IN NACL 80 MCG/20ML IV SOLN
INTRAVENOUS | Status: DC | PRN
  Administered 2022-01-31 (×4): 4 ug via BUCCAL

## 2022-01-31 MED ORDER — ENOXAPARIN SODIUM 40 MG/0.4ML IJ SOSY
40.0000 mg | PREFILLED_SYRINGE | INTRAMUSCULAR | Status: DC
  Administered 2022-02-01: 40 mg via SUBCUTANEOUS
  Filled 2022-01-31: qty 0.4

## 2022-01-31 MED ORDER — SODIUM CHLORIDE (PF) 0.9 % IJ SOLN
INTRAMUSCULAR | Status: AC
  Filled 2022-01-31: qty 50

## 2022-01-31 MED ORDER — LACTATED RINGERS IV SOLN: INTRAVENOUS | Status: DC | PRN

## 2022-01-31 MED ORDER — STERILE WATER FOR IRRIGATION IR SOLN
Status: DC | PRN
  Administered 2022-01-31: 50 mL

## 2022-01-31 MED ORDER — OXYCODONE HCL 5 MG PO TABS: 5.0000 mg | ORAL_TABLET | Freq: Once | ORAL | Status: DC | PRN

## 2022-01-31 MED ORDER — BUPIVACAINE LIPOSOME 1.3 % IJ SUSP
INTRAMUSCULAR | Status: AC
  Filled 2022-01-31: qty 20

## 2022-01-31 MED ORDER — BUPIVACAINE-EPINEPHRINE (PF) 0.5% -1:200000 IJ SOLN
INTRAMUSCULAR | Status: AC
  Filled 2022-01-31: qty 30

## 2022-01-31 MED ORDER — CHLORHEXIDINE GLUCONATE 0.12 % MT SOLN
OROMUCOSAL | Status: AC
  Administered 2022-01-31: 15 mL via OROMUCOSAL
  Filled 2022-01-31: qty 15

## 2022-01-31 MED ORDER — ACETAMINOPHEN 10 MG/ML IV SOLN
INTRAVENOUS | Status: DC | PRN
  Administered 2022-01-31: 1000 mg via INTRAVENOUS

## 2022-01-31 MED ORDER — ONDANSETRON HCL 4 MG/2ML IJ SOLN
INTRAMUSCULAR | Status: DC | PRN
  Administered 2022-01-31: 4 mg via INTRAVENOUS

## 2022-01-31 MED ORDER — PANTOPRAZOLE SODIUM 40 MG IV SOLR
40.0000 mg | Freq: Every day | INTRAVENOUS | Status: DC
  Administered 2022-01-31: 40 mg via INTRAVENOUS
  Filled 2022-01-31: qty 10

## 2022-01-31 MED ORDER — HYDROMORPHONE HCL 1 MG/ML IJ SOLN: 0.2500 mg | INTRAMUSCULAR | Status: DC | PRN

## 2022-01-31 MED ORDER — PROPOFOL 10 MG/ML IV BOLUS
INTRAVENOUS | Status: AC
  Filled 2022-01-31: qty 20

## 2022-01-31 MED ORDER — TECHNETIUM TC 99M TILMANOCEPT KIT
1.1000 | PACK | Freq: Once | INTRAVENOUS | Status: AC | PRN
  Administered 2022-01-31: 1.1 via INTRADERMAL

## 2022-01-31 MED ORDER — PHENYLEPHRINE HCL-NACL 20-0.9 MG/250ML-% IV SOLN
INTRAVENOUS | Status: AC
  Filled 2022-01-31: qty 250

## 2022-01-31 MED ORDER — PROPOFOL 10 MG/ML IV BOLUS
INTRAVENOUS | Status: DC | PRN
  Administered 2022-01-31: 180 mg via INTRAVENOUS

## 2022-01-31 MED ORDER — HYDROMORPHONE HCL 1 MG/ML IJ SOLN
INTRAMUSCULAR | Status: DC | PRN
  Administered 2022-01-31: .3 mg via INTRAVENOUS
  Administered 2022-01-31 (×2): .2 mg via INTRAVENOUS
  Administered 2022-01-31: .3 mg via INTRAVENOUS

## 2022-01-31 MED ORDER — PHENYLEPHRINE HCL-NACL 20-0.9 MG/250ML-% IV SOLN
INTRAVENOUS | Status: DC | PRN
  Administered 2022-01-31: 30 ug/min via INTRAVENOUS

## 2022-01-31 MED ORDER — TRIAMTERENE-HCTZ 37.5-25 MG PO TABS
0.5000 | ORAL_TABLET | Freq: Every day | ORAL | Status: DC
  Administered 2022-01-31 – 2022-02-01 (×2): 0.5 via ORAL
  Filled 2022-01-31 (×2): qty 0.5

## 2022-01-31 MED ORDER — FENTANYL CITRATE (PF) 100 MCG/2ML IJ SOLN
INTRAMUSCULAR | Status: DC | PRN
  Administered 2022-01-31: 100 ug via INTRAVENOUS

## 2022-01-31 MED ORDER — KETOROLAC TROMETHAMINE 15 MG/ML IJ SOLN
INTRAMUSCULAR | Status: DC | PRN
  Administered 2022-01-31: 15 mg via INTRAVENOUS

## 2022-01-31 MED ORDER — DEXAMETHASONE SODIUM PHOSPHATE 10 MG/ML IJ SOLN
INTRAMUSCULAR | Status: DC | PRN
  Administered 2022-01-31: 5 mg via INTRAVENOUS

## 2022-01-31 MED ORDER — LIDOCAINE HCL (CARDIAC) PF 100 MG/5ML IV SOSY
PREFILLED_SYRINGE | INTRAVENOUS | Status: DC | PRN
  Administered 2022-01-31: 80 mg via INTRAVENOUS

## 2022-01-31 MED ORDER — CEFAZOLIN SODIUM-DEXTROSE 2-4 GM/100ML-% IV SOLN
INTRAVENOUS | Status: AC
  Filled 2022-01-31: qty 100

## 2022-01-31 MED ORDER — OXYCODONE HCL 5 MG/5ML PO SOLN: 5.0000 mg | Freq: Once | ORAL | Status: DC | PRN

## 2022-01-31 MED ORDER — CEFAZOLIN SODIUM-DEXTROSE 2-3 GM-%(50ML) IV SOLR
INTRAVENOUS | Status: DC | PRN
  Administered 2022-01-31: 2 g via INTRAVENOUS

## 2022-01-31 MED ORDER — SODIUM CHLORIDE 0.9 % IV SOLN: INTRAVENOUS | Status: DC

## 2022-01-31 MED ORDER — FENTANYL CITRATE (PF) 100 MCG/2ML IJ SOLN
INTRAMUSCULAR | Status: AC
  Filled 2022-01-31: qty 2

## 2022-01-31 MED ORDER — HYDROCODONE-ACETAMINOPHEN 5-325 MG PO TABS: 1.0000 | ORAL_TABLET | ORAL | Status: DC | PRN

## 2022-01-31 MED ORDER — SUGAMMADEX SODIUM 200 MG/2ML IV SOLN
INTRAVENOUS | Status: DC | PRN
  Administered 2022-01-31: 100 mg via INTRAVENOUS
  Administered 2022-01-31: 50 mg via INTRAVENOUS

## 2022-01-31 MED ORDER — ONDANSETRON HCL 4 MG/2ML IJ SOLN: 4.0000 mg | Freq: Four times a day (QID) | INTRAMUSCULAR | Status: DC | PRN

## 2022-01-31 MED ORDER — MORPHINE SULFATE (PF) 4 MG/ML IV SOLN: 4.0000 mg | INTRAVENOUS | Status: DC | PRN

## 2022-01-31 SURGICAL SUPPLY — 41 items
BINDER BREAST XLRG (GAUZE/BANDAGES/DRESSINGS) IMPLANT
BULB RESERV EVAC DRAIN JP 100C (MISCELLANEOUS) ×2 IMPLANT
CHLORAPREP W/TINT 26 (MISCELLANEOUS) ×1 IMPLANT
COVER PROBE GAMMA FINDER SLV (MISCELLANEOUS) ×1 IMPLANT
DERMABOND ADVANCED .7 DNX12 (GAUZE/BANDAGES/DRESSINGS) ×1 IMPLANT
DRAIN CHANNEL JP 15F RND 16 (MISCELLANEOUS) ×2 IMPLANT
DRAIN CHANNEL JP 19F (MISCELLANEOUS) IMPLANT
DRAPE LAPAROTOMY TRNSV 106X77 (MISCELLANEOUS) ×1 IMPLANT
DRSG GAUZE FLUFF 36X18 (GAUZE/BANDAGES/DRESSINGS) ×1 IMPLANT
ELECT REM PT RETURN 9FT ADLT (ELECTROSURGICAL) ×1
ELECTRODE REM PT RTRN 9FT ADLT (ELECTROSURGICAL) ×1 IMPLANT
GAUZE 4X4 16PLY ~~LOC~~+RFID DBL (SPONGE) ×1 IMPLANT
GAUZE SPONGE 4X4 12PLY STRL (GAUZE/BANDAGES/DRESSINGS) ×1 IMPLANT
GLOVE BIO SURGEON STRL SZ 6.5 (GLOVE) ×1 IMPLANT
GLOVE BIOGEL PI IND STRL 6.5 (GLOVE) ×1 IMPLANT
GOWN STRL REUS W/ TWL LRG LVL3 (GOWN DISPOSABLE) ×2 IMPLANT
GOWN STRL REUS W/TWL LRG LVL3 (GOWN DISPOSABLE) ×3
KIT TURNOVER KIT A (KITS) ×1 IMPLANT
LABEL OR SOLS (LABEL) ×1 IMPLANT
MANIFOLD NEPTUNE II (INSTRUMENTS) ×1 IMPLANT
MARGIN MAP 10MM (MISCELLANEOUS) ×1 IMPLANT
PACK BASIN MAJOR ARMC (MISCELLANEOUS) ×1 IMPLANT
PAD ABD DERMACEA PRESS 5X9 (GAUZE/BANDAGES/DRESSINGS) ×1 IMPLANT
SPONGE T-LAP 18X18 ~~LOC~~+RFID (SPONGE) ×2 IMPLANT
SUT ETHILON 3-0 FS-10 30 BLK (SUTURE) ×1
SUT ETHILON 4-0 (SUTURE)
SUT ETHILON 4-0 FS2 18XMFL BLK (SUTURE)
SUT MNCRL 4-0 (SUTURE) ×3
SUT MNCRL 4-0 27XMFL (SUTURE) ×3
SUT SILK 2 0 SH (SUTURE) IMPLANT
SUT SILK 3-0 (SUTURE) ×1 IMPLANT
SUT VIC AB 3-0 54X BRD REEL (SUTURE) ×1 IMPLANT
SUT VIC AB 3-0 BRD 54 (SUTURE) ×1
SUT VIC AB 3-0 SH 27 (SUTURE) ×2
SUT VIC AB 3-0 SH 27X BRD (SUTURE) ×2 IMPLANT
SUT VIC AB 3-0 SH 8-18 (SUTURE) ×1 IMPLANT
SUTURE EHLN 3-0 FS-10 30 BLK (SUTURE) ×1 IMPLANT
SUTURE ETHLN 4-0 FS2 18XMF BLK (SUTURE) IMPLANT
SUTURE MNCRL 4-0 27XMF (SUTURE) ×2 IMPLANT
TRAP FLUID SMOKE EVACUATOR (MISCELLANEOUS) ×1 IMPLANT
WATER STERILE IRR 500ML POUR (IV SOLUTION) ×1 IMPLANT

## 2022-01-31 NOTE — Anesthesia Procedure Notes (Signed)
Procedure Name: Intubation Date/Time: 01/31/2022 9:34 AM  Performed by: Otho Perl, CRNAPre-anesthesia Checklist: Patient identified, Patient being monitored, Timeout performed, Emergency Drugs available and Suction available Patient Re-evaluated:Patient Re-evaluated prior to induction Oxygen Delivery Method: Circle system utilized Preoxygenation: Pre-oxygenation with 100% oxygen Induction Type: IV induction Ventilation: Mask ventilation without difficulty Laryngoscope Size: Mac, 3 and McGraph Grade View: Grade I Tube type: Oral Tube size: 7.0 mm Number of attempts: 1 Airway Equipment and Method: Stylet Placement Confirmation: ETT inserted through vocal cords under direct vision, positive ETCO2 and breath sounds checked- equal and bilateral Secured at: 21 cm Tube secured with: Tape Dental Injury: Teeth and Oropharynx as per pre-operative assessment

## 2022-01-31 NOTE — Transfer of Care (Signed)
Immediate Anesthesia Transfer of Care Note  Patient: Stacy [name]  Procedure(s) Performed: MASTECTOMY WITH SENTINEL LYMPH NODE BIOPSY (Left: Breast)  Patient Location: PACU  Anesthesia Type:General  Level of Consciousness: awake  Airway & Oxygen Therapy: Patient Spontanous Breathing and Patient connected to face mask oxygen  Post-op Assessment: Report given to RN, Post -op Vital signs reviewed and stable, and Patient moving all extremities X 4  Post vital signs: Reviewed  Last Vitals:  Vitals Value Taken Time  BP 141/75 01/31/22 1200  Temp 35.9 C 01/31/22 1155  Pulse 58 01/31/22 1155  Resp 12 01/31/22 1208  SpO2 95 % 01/31/22 1155  Vitals shown include unvalidated device data.  Last Pain:  Vitals:   01/31/22 1155  TempSrc:   PainSc: Asleep         Complications: No notable events documented.

## 2022-01-31 NOTE — Anesthesia Preprocedure Evaluation (Signed)
Anesthesia Evaluation  Patient identified by MRN, date of birth, ID band Patient awake    Reviewed: Allergy & Precautions, NPO status , Patient's Chart, lab work & pertinent test results  History of Anesthesia Complications Negative for: history of anesthetic complications  Airway Mallampati: II  TM Distance: >3 FB Neck ROM: full    Dental no notable dental hx.    Pulmonary neg pulmonary ROS, former smoker   Pulmonary exam normal        Cardiovascular hypertension, On Medications negative cardio ROS Normal cardiovascular exam     Neuro/Psych negative neurological ROS  negative psych ROS   GI/Hepatic Neg liver ROS,GERD  ,,  Endo/Other  diabetes    Renal/GU      Musculoskeletal  (+) Arthritis ,    Abdominal   Peds  Hematology negative hematology ROS (+)   Anesthesia Other Findings Past Medical History: No date: Allergy No date: Arthritis No date: Cancer ([place]) No date: GERD (gastroesophageal reflux disease) No date: Hyperlipidemia No date: Hypertension 2007: Neoplasm of uncertain behavior of breast No date: Pre-diabetes No date: Vertigo  Past Surgical History: 2015: BREAST BIOPSY; Right     Comment:  3 stereo biopsies. 2 benign. one was radial scar, no               excision done for radial scar 07/16/2017: BREAST BIOPSY; Left     Comment:  BIPHASIC STROMAL AND EPITHELIAL LESION./ Dr Bary Castilla 08/23/2020: BREAST BIOPSY; Left     Comment:  [place] bx of mass 5:00 2cmfn, coil marker, FIBROEPITHELIAL               LESION WITH INTRACANALICULAR GROWTH PATTERN AND VARIABLE               STROMAL HYPERCELLULARITY 01/02/2022: BREAST BIOPSY; Left     Comment:  [place] bx path pending 10:00 ribbon clip 01/02/2022: BREAST BIOPSY; Left     Comment:  [place] LT BREAST BX W LOC DEV 1ST LESION IMG BX [place]               GUIDE 01/02/2022 [facility] 1968: BREAST EXCISIONAL BIOPSY; Left     Comment:  x 3 per pt 2007: BREAST  EXCISIONAL BIOPSY; Right     Comment:  benign No date: BUNIONECTOMY; Left 2011: COLONOSCOPY     Comment:  DR,ISHAKIS 10/07/2020: EXCISION OF BREAST LESION; Left     Comment:  Procedure: EXCISION OF BREAST LESION;  Surgeon: Robert Bellow, MD;  Location: [facility] ORS;  Service: General;                Laterality: Left; FIBROEPITHELIAL LESION WITH               NTRACANALICULAR GROWTH PATTERN AND VARIABLE STROMAL No date: HAMMER TOE SURGERY; Left     Comment:  2nd toe 12/15/2014: HEEL SPUR SURGERY; Left     Comment:  [facility]. Earleen Newport at [facility] No date: [place]; Right 06/08/2021: TOTAL KNEE ARTHROPLASTY; Left     Comment:  Procedure: TOTAL KNEE ARTHROPLASTY;  Surgeon: Hessie Knows, MD;  Location: [facility] ORS;  Service: Orthopedics;               Laterality: Left;     Reproductive/Obstetrics negative OB ROS  Anesthesia Physical Anesthesia Plan  ASA: 2  Anesthesia Plan: General ETT   Post-op Pain Management: Toradol IV (intra-op)*, Ofirmev IV (intra-op)* and Dilaudid IV   Induction: Intravenous  PONV Risk Score and Plan: Ondansetron, Dexamethasone, Midazolam and Treatment may vary due to age or medical condition  Airway Management Planned: Oral ETT  Additional Equipment:   Intra-op Plan:   Post-operative Plan: Extubation in OR  Informed Consent: I have reviewed the patients History and Physical, chart, labs and discussed the procedure including the risks, benefits and alternatives for the proposed anesthesia with the patient or authorized representative who has indicated his/her understanding and acceptance.     Dental Advisory Given  Plan Discussed with: Anesthesiologist, CRNA and Surgeon  Anesthesia Plan Comments: (Patient consented for risks of anesthesia including but not limited to:  - adverse reactions to medications - damage to eyes, teeth, lips or other oral mucosa -  nerve damage due to positioning  - sore throat or hoarseness - Damage to heart, brain, nerves, lungs, other parts of body or loss of life  Patient voiced understanding.)        Anesthesia Quick Evaluation

## 2022-01-31 NOTE — Anesthesia Postprocedure Evaluation (Signed)
Anesthesia Post Note  Patient: Stacy [name]  Procedure(s) Performed: MASTECTOMY WITH SENTINEL LYMPH NODE BIOPSY (Left: Breast)  Patient location during evaluation: PACU Anesthesia Type: General Level of consciousness: awake and alert Pain management: pain level controlled Vital Signs Assessment: post-procedure vital signs reviewed and stable Respiratory status: spontaneous breathing, nonlabored ventilation, respiratory function stable and patient connected to nasal cannula oxygen Cardiovascular status: blood pressure returned to baseline and stable Postop Assessment: no apparent nausea or vomiting Anesthetic complications: no   No notable events documented.   Last Vitals:  Vitals:   01/31/22 1216 01/31/22 1231  BP: (!) 145/78 (!) 155/82  Pulse:  75  Resp: 12 15  Temp:    SpO2: 100% 100%    Last Pain:  Vitals:   01/31/22 1216  TempSrc:   PainSc: 0-No pain                 Ilene Qua

## 2022-01-31 NOTE — Progress Notes (Signed)
Informed patient's family via text pt is resting quietly in bed with eyes closed.  VSS, denies pain.  Will inform family when room is ready on med surge

## 2022-01-31 NOTE — Progress Notes (Signed)
Pt had left mastectomy; dressing is clean, dry and intact. JP drain in placed and putting good output. Pt denied during this shift. Schedule BP medications given for high BP. IVF started.

## 2022-01-31 NOTE — Interval H&P Note (Signed)
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  The various methods of treatment have been discussed with the patient and family. After consideration of risks, benefits and other options for treatment, the patient has consented to  Procedure(s): MASTECTOMY WITH SENTINEL LYMPH NODE BIOPSY (Left) as a surgical intervention.  The patient's history has been reviewed, patient examined, no change in status, stable for surgery.  I have reviewed the patient's chart and labs.  Questions were answered to the patient's satisfaction.     Herbert Pun

## 2022-01-31 NOTE — Op Note (Signed)
Preoperative diagnosis: Invasive Carcinoma of the left breast.  Postoperative diagnosis: Same.   Procedure: Left total mastectomy.                     Sentinel lymph node biopsy  Anesthesia: GETA  Surgeon: Dr. Windell Moment  Wound Classification: Clean  Indications: Patient is a 77 y.o. female who had an abnormal mammogram that on workup with core needle biopsy was found to be invasive ductal carcinoma. After discussion of alternatives, the patient elected total mastectomy.  Findings: No palpable masses or abnormal axillary lymph node  Description of procedure: The patient was brought to the operating room and general anesthesia was induced. A time-out was completed verifying correct patient, procedure, site, positioning, and implant(s) and/or special equipment prior to beginning this procedure. The breast, chest wall, axilla, and upper arm and neck were prepped and draped in the usual sterile fashion.  A skin incision was made that encompassed the nipple-areola complex and the previous biopsy scar and passed in an oblique direction across the breast. Flaps were raised in the avascular plane between subcutaneous tissue and breast tissue from the clavicle superiorly, the sternum medially, the anterior rectus sheath inferiorly, and past the lateral border of the pectoralis major muscle laterally. Hemostasis was achieved in the flaps. Next, the breast tissue and underlying pectoralis fascia were excised from the pectoralis major muscle, progressing from medially to laterally. At the lateral border of the pectoralis major muscle, the breast tissue was swung laterally and a lateral pedicle identified where breast tissue gave way to fat of axilla. The lateral pedicle was incised and the specimen removed.   A hand-held gamma probe was used to identify the location of the hottest spot in the axilla.  Dissection was carried down until subdermal facias was advanced. The probe was placed and again, the  point of maximal count was found. Dissection continue until nodule was identified. The probe was placed in contact with the node. The node was excised in its entirety.  Two additional hot spot was detected and the node was excised in similar fashion. No clinically abnormal nodes were palpated. The procedure was terminated. Hemostasis was achieved and the wound closed in layers with deep interrupted 3-0 Vicryl and skin was closed with subcuticular suture of Monocryl 3-0.   The wound was irrigated and hemostasis was achieved. Closed suction drains were brought into the operative field through a separate stab incision and sutured to the skin with a 3-0 nylon suture. The wound was closed with interrupted 3-0 Vicryl to the subcutaneous layer, followed by a subcuticular layer of Monocryl 4-0. The wound was dressed.  The patient tolerated the procedure well and was taken to the postanesthesia care unit in stable condition.     Sentinel Node Biopsy Synoptic Operative Report  Operation performed with curative intent:Yes  Tracer(s) used to identify sentinel nodes in the upfront surgery (non-neoadjuvant) setting (select all that apply):Radioactive Tracer  Tracer(s) used to identify sentinel nodes in the neoadjuvant setting (select all that apply):N/A  All nodes (colored or non-colored) present at the end of a dye-filled lymphatic channel were removed:N/A  All significantly radioactive nodes were removed:Yes  All palpable suspicious nodes were removed:N/A  Biopsy-proven positive nodes marked with clips prior to chemotherapy were identified and removed:N/A  Specimen: Left Breast                    Sentinel lymph nodes #1, #2, #3  Complications: None  Estimated Blood  Loss: 50 mL

## 2022-02-01 ENCOUNTER — Encounter: Payer: Self-pay | Admitting: General Surgery

## 2022-02-01 DIAGNOSIS — Z7982 Long term (current) use of aspirin: Secondary | ICD-10-CM | POA: Diagnosis not present

## 2022-02-01 DIAGNOSIS — Z79899 Other long term (current) drug therapy: Secondary | ICD-10-CM | POA: Diagnosis not present

## 2022-02-01 DIAGNOSIS — E119 Type 2 diabetes mellitus without complications: Secondary | ICD-10-CM | POA: Diagnosis not present

## 2022-02-01 DIAGNOSIS — I10 Essential (primary) hypertension: Secondary | ICD-10-CM | POA: Diagnosis not present

## 2022-02-01 DIAGNOSIS — C50212 Malignant neoplasm of upper-inner quadrant of left female breast: Secondary | ICD-10-CM | POA: Diagnosis not present

## 2022-02-01 DIAGNOSIS — Z170 Estrogen receptor positive status [ER+]: Secondary | ICD-10-CM | POA: Diagnosis not present

## 2022-02-01 LAB — BASIC METABOLIC PANEL
Anion gap: 6 (ref 5–15)
BUN: 19 mg/dL (ref 8–23)
CO2: 27 mmol/L (ref 22–32)
Calcium: 8.6 mg/dL — ABNORMAL LOW (ref 8.9–10.3)
Chloride: 104 mmol/L (ref 98–111)
Creatinine, Ser: 0.8 mg/dL (ref 0.44–1.00)
GFR, Estimated: >60 mL/min (ref >60)
Glucose, Bld: 141 mg/dL — ABNORMAL HIGH (ref 70–99)
Potassium: 3.5 mmol/L (ref 3.5–5.1)
Sodium: 137 mmol/L (ref 135–145)

## 2022-02-01 LAB — CBC
HCT: 35.1 % — ABNORMAL LOW (ref 36.0–46.0)
Hemoglobin: 11.3 g/dL — ABNORMAL LOW (ref 12.0–15.0)
MCH: 28.3 pg (ref 26.0–34.0)
MCHC: 32.2 g/dL (ref 30.0–36.0)
MCV: 87.8 fL (ref 80.0–100.0)
Platelets: 231 10*3/uL (ref 150–400)
RBC: 4 MIL/uL (ref 3.87–5.11)
RDW: 14.7 % (ref 11.5–15.5)
WBC: 10 10*3/uL (ref 4.0–10.5)
nRBC: 0 % (ref 0.0–0.2)

## 2022-02-01 MED ORDER — HYDROCODONE-ACETAMINOPHEN 5-325 MG PO TABS: 1.0000 | ORAL_TABLET | ORAL | 0 refills | Status: DC | PRN

## 2022-02-01 NOTE — Discharge Instructions (Signed)
  Diet: Resume home heart healthy regular diet.  ° °Activity: No heavy lifting >20 pounds (children, pets, laundry, garbage) or strenuous activity until follow-up, but light activity and walking are encouraged. Do not drive or drink alcohol if taking narcotic pain medications. ° °Wound care: May shower with soapy water and pat dry (do not rub incisions), but no baths or submerging incision underwater until follow-up. (no swimming)  ° °Medications: Resume all home medications. For mild to moderate pain: acetaminophen (Tylenol) or ibuprofen (if no kidney disease). Combining Tylenol with alcohol can substantially increase your risk of causing liver disease. Narcotic pain medications, if prescribed, can be used for severe pain, though may cause nausea, constipation, and drowsiness. Do not combine Tylenol and Norco within a 6 hour period as Norco contains Tylenol. If you do not need the narcotic pain medication, you do not need to fill the prescription. ° °Call office ([phone]) at any time if any questions, worsening pain, fevers/chills, bleeding, drainage from incision site, or other concerns. ° °

## 2022-02-01 NOTE — Discharge Summary (Signed)
Patient ID: Stacy [name] MRN: [id number] DOB/AGE: Feb 13, 1945 77 y.o.  Admit date: 01/31/2022 Discharge date: 02/01/2022   Discharge Diagnoses:  Principal Problem:   Breast cancer [facility])   Procedures: Left total mastectomy with axillary sentinel node biopsy  Hospital Course: Patient with left breast invasive mammary carcinoma.  She was admitted for total mastectomy with axillary sentinel lymph node biopsy.  She has been doing well.  This morning with pain control.  Patient with adequately healing wound.  No hematoma or fluid collection.  Drain adequate output.  Physical Exam Vitals reviewed.  HENT:     Head: Normocephalic.  Cardiovascular:     Rate and Rhythm: Normal rate and regular rhythm.     Pulses: Normal pulses.  Pulmonary:     Effort: Pulmonary effort is normal.  Abdominal:     General: Abdomen is flat.  Musculoskeletal:     Cervical back: Normal range of motion.  Skin:    General: Skin is warm.     Capillary Refill: Capillary refill takes less than 2 seconds.  Neurological:     Mental Status: She is alert.      Consults: None  Disposition: Discharge disposition: 01-Home or Self Care       Discharge Instructions     Diet - low sodium heart healthy   Complete by: As directed    Increase activity slowly   Complete by: As directed       Allergies as of 02/01/2022       Reactions   Altace [ramipril] Swelling, Other (See Comments)   lips        Medication List     TAKE these medications    acetaminophen 500 MG tablet Commonly known as: TYLENOL Take 500-1,000 mg by mouth every 6 (six) hours as needed (pain.).   aspirin EC 81 MG tablet Take 81 mg by mouth in the morning.   CINNAMON PO Take 1,000 mg by mouth in the morning.   FISH OIL-BORAGE-FLAX-SAFFLOWER PO Take 1 capsule by mouth in the morning.   fluticasone 50 MCG/ACT nasal spray Commonly known as: FLONASE Place 2 sprays into both nostrils daily. What changed:  when to take  this reasons to take this   HYDROcodone-acetaminophen 5-325 MG tablet Commonly known as: NORCO/VICODIN Take 1-2 tablets by mouth every 4 (four) hours as needed for moderate pain.   LIDOCAINE-MENTHOL EX Apply 1 application  topically 3 (three) times daily as needed (osteoarthritis pain.). [employer]'s Best Maximum Strength Topical Pain Relief Cream (4 % Lidocaine/Menthol)   loratadine 10 MG tablet Commonly known as: CLARITIN Take 1 tablet (10 mg total) by mouth daily. What changed:  when to take this reasons to take this   meloxicam 7.5 MG tablet Commonly known as: MOBIC Take 7.5 mg by mouth as needed.   multivitamin capsule Take 1 capsule by mouth daily.   rosuvastatin 5 MG tablet Commonly known as: CRESTOR Take 1 tablet (5 mg total) by mouth every Monday, Wednesday, and Friday.   triamterene-hydrochlorothiazide 37.5-25 MG tablet Commonly known as: MAXZIDE-25 Take 0.5-1 tablets by mouth daily. What changed: how much to take   VITAMIN D-3 PO Take 2,000 Units by mouth in the morning.        Follow-up Information     Herbert Pun, MD Follow up in 1 week(s).   Specialty: General Surgery Why: For wound re-check and drain check Contact information: [place] [address] [phone]

## 2022-02-01 NOTE — Progress Notes (Signed)
  Transition of Care [facility]) Screening Note   Patient Details  Name: Stacy [name] Date of Birth: [birth year]   Transition of Care [facility]) CM/SW Contact:    Tiburcio Bash, LCSW Phone Number: 02/01/2022, 9:45 AM    Transition of Care Department [facility]) has reviewed patient and no TOC needs have been identified at this time. We will continue to monitor patient advancement through interdisciplinary progression rounds. If new patient transition needs arise, please place a TOC consult.  Kelby Fam, [place], MSW, [place]

## 2022-02-02 ENCOUNTER — Other Ambulatory Visit: Payer: Self-pay | Admitting: Pathology

## 2022-02-02 DIAGNOSIS — E1169 Type 2 diabetes mellitus with other specified complication: Secondary | ICD-10-CM | POA: Diagnosis not present

## 2022-02-02 DIAGNOSIS — Z7982 Long term (current) use of aspirin: Secondary | ICD-10-CM | POA: Diagnosis not present

## 2022-02-02 DIAGNOSIS — E785 Hyperlipidemia, unspecified: Secondary | ICD-10-CM | POA: Diagnosis not present

## 2022-02-02 DIAGNOSIS — I10 Essential (primary) hypertension: Secondary | ICD-10-CM | POA: Diagnosis not present

## 2022-02-02 DIAGNOSIS — Z483 Aftercare following surgery for neoplasm: Secondary | ICD-10-CM | POA: Diagnosis not present

## 2022-02-02 DIAGNOSIS — Z7951 Long term (current) use of inhaled steroids: Secondary | ICD-10-CM | POA: Diagnosis not present

## 2022-02-02 DIAGNOSIS — Z170 Estrogen receptor positive status [ER+]: Secondary | ICD-10-CM | POA: Diagnosis not present

## 2022-02-02 DIAGNOSIS — H3530 Unspecified macular degeneration: Secondary | ICD-10-CM | POA: Diagnosis not present

## 2022-02-02 DIAGNOSIS — C50212 Malignant neoplasm of upper-inner quadrant of left female breast: Secondary | ICD-10-CM | POA: Diagnosis not present

## 2022-02-02 LAB — SURGICAL PATHOLOGY

## 2022-02-05 ENCOUNTER — Ambulatory Visit: Payer: Self-pay | Admitting: Licensed Clinical Social Worker

## 2022-02-05 ENCOUNTER — Encounter: Payer: Self-pay | Admitting: Licensed Clinical Social Worker

## 2022-02-05 ENCOUNTER — Telehealth: Payer: Self-pay | Admitting: Licensed Clinical Social Worker

## 2022-02-05 ENCOUNTER — Encounter: Payer: Self-pay | Admitting: *Deleted

## 2022-02-05 DIAGNOSIS — Z1379 Encounter for other screening for genetic and chromosomal anomalies: Secondary | ICD-10-CM | POA: Insufficient documentation

## 2022-02-05 NOTE — Progress Notes (Signed)
Oncotype Dx order [id number] submitted online on path [phone].

## 2022-02-05 NOTE — Telephone Encounter (Signed)
I contacted Ms. Fahr to discuss her genetic testing results. No pathogenic variants were identified in the 70 genes analyzed. Detailed clinic note to follow.   The test report has been scanned into EPIC and is located under the Molecular Pathology section of the Results Review tab.  A portion of the result report is included below for reference.      Faith Rogue, MS, [facility] Genetic Counselor [email or web address] Phone: [phone]

## 2022-02-05 NOTE — Progress Notes (Signed)
HPI:   Stacy [name] was previously seen in the [facility] [place] clinic due to a personal and family history of breast cancer and concerns regarding a hereditary predisposition to cancer. Please refer to our prior cancer genetics clinic note for more information regarding our discussion, assessment and recommendations, at the time. Stacy [name] recent genetic test results were disclosed to her, as were recommendations warranted by these results. These results and recommendations are discussed in more detail below.  CANCER HISTORY:  Oncology History  Invasive carcinoma of breast ([place])  11/21/2021 Mammogram   Bilateral screening mammogram showed In the left breast, a possible mass warrants further evaluation. In the right breast, no findings suspicious for malignancy.   12/14/2021 Mammogram   Left unilateral diagnostic mammogram/ ultrasound showed Suspicious 1.2 cm mass over the 10 o'clock position of the left breast 4 cm from the nipple. No pathologically enlarged Left axilla lymph node    01/12/2022 Initial Diagnosis   Invasive carcinoma of breast   -Left breast lesion ultrasound-guided biopsy showed invasive mammary carcinoma with mucinous features, grade 2, DCIS negative, LVI negative ER 90%, PR 20%, HER2 IHC 2+ equivocal, FISH negative.  Menarche at age of 1-11 First live birth at age of 45 OCP use: 5 years Menopausal status: Postmenopausal, LMP in 22s. 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 No pathogenic variants identified on the [facility] Multi-Cancer+RNA panel. The report date is 02/04/2022.  The Multi-Cancer + RNA Panel offered by [facility] includes sequencing and/or deletion/duplication analysis of the following 70 genes:  AIP*, ALK, APC*, ATM*, AXIN2*, BAP1*, BARD1*, BLM*, BMPR1A*, BRCA1*, BRCA2*, BRIP1*, CDC73*, CDH1*, CDK4, CDKN1B*, CDKN2A, CHEK2*, CTNNA1*, DICER1*, EPCAM, EGFR, FH*, FLCN*, GREM1, HOXB13, KIT, LZTR1, MAX*, MBD4, MEN1*, MET, MITF, MLH1*, MSH2*, MSH3*, MSH6*, MUTYH*, NF1*, NF2*, NTHL1*, PALB2*, PDGFRA, PMS2*, POLD1*, POLE*, POT1*, PRKAR1A*, PTCH1*, PTEN*, RAD51C*, RAD51D*, RB1*, RET, SDHA*, SDHAF2*, SDHB*, SDHC*, SDHD*, SMAD4*, SMARCA4*, SMARCB1*, SMARCE1*, STK11*, SUFU*, TMEM127*, TP53*, TSC1*, TSC2*, VHL*. RNA analysis is performed for * genes.     FAMILY HISTORY:  We obtained a detailed, 4-generation family history.  Significant diagnoses are listed below: Family History  Problem Relation Age of Onset   Stroke Mother    Arthritis Mother    Breast cancer Sister 59   Breast cancer Sister    Cancer Brother 41       kidney   Kidney cancer Brother    Cancer Maternal Grandmother        Gallbladder   Stroke Paternal Grandmother     Stacy [name] has 2 sisters, 1 brother. One sister had breast cancer at 54 and is living at 56. Another sister had breast cancer in her 71s-60s and is living at 2. Her brother passed at 74, he had kidney cancer.   Stacy [name] mother died at 33. Maternal grandmother had gallbladder cancer and passed in her 41s.    Stacy [name] father died in his 17s, no known cancers on his side of the family.   Stacy [name] is unaware of previous  family history of genetic testing for hereditary cancer risks. There is no reported Ashkenazi Jewish ancestry. There is no known consanguinity.   GENETIC TEST RESULTS:  The [facility] Multi-Cancer+RNA Panel found no pathogenic mutations.   The Multi-Cancer + RNA Panel offered by [facility] includes  sequencing and/or deletion/duplication analysis of the following 70 genes:  AIP*, ALK, APC*, ATM*, AXIN2*, BAP1*, BARD1*, BLM*, BMPR1A*, BRCA1*, BRCA2*, BRIP1*, CDC73*, CDH1*, CDK4, CDKN1B*, CDKN2A, CHEK2*, CTNNA1*, DICER1*, EPCAM, EGFR, FH*, FLCN*, GREM1, HOXB13, KIT, LZTR1, MAX*, MBD4, MEN1*, MET, MITF, MLH1*, MSH2*, MSH3*, MSH6*, MUTYH*, NF1*, NF2*, NTHL1*, PALB2*, PDGFRA, PMS2*, POLD1*, POLE*, POT1*, PRKAR1A*, PTCH1*, PTEN*, RAD51C*, RAD51D*, RB1*, RET, SDHA*, SDHAF2*, SDHB*, SDHC*, SDHD*, SMAD4*, SMARCA4*, SMARCB1*, SMARCE1*, STK11*, SUFU*, TMEM127*, TP53*, TSC1*, TSC2*, VHL*. RNA analysis is performed for * genes.   The test report has been scanned into EPIC and is located under the Molecular Pathology section of the Results Review tab.  A portion of the result report is included below for reference. Genetic testing reported out on 02/04/2022.     Even though a pathogenic variant was not identified, possible explanations for the cancer in the family may include: There may be no hereditary risk for cancer in the family. The cancers in Stacy [name] and/or her family may be sporadic/familial or due to other genetic and environmental factors. There may be a gene mutation in one of these genes that current testing methods cannot detect but that chance is small. There could be another gene that has not yet been discovered, or that we have not yet tested, that is responsible for the cancer diagnoses in the family.  It is also possible there is a hereditary cause for the cancer in the family that Stacy [name] did not inherit.  Therefore, it is important to remain in touch with cancer genetics in the future so that we can continue to offer Stacy [name] the most up to date genetic testing.   ADDITIONAL GENETIC TESTING:  We discussed with Stacy [name] that her genetic testing was fairly extensive.  If there are additional relevant genes identified to increase cancer risk that can be analyzed in the future, we would be  happy to discuss and coordinate this testing at that time.    CANCER SCREENING RECOMMENDATIONS:  Stacy [name] test result is considered negative (normal).  This means that we have not identified a hereditary cause for her personal and family history of cancer at this time.   An individual's cancer risk and medical management are not determined by genetic test results alone. Overall cancer risk assessment incorporates additional factors, including personal medical history, family history, and any available genetic information that may result in a personalized plan for cancer prevention and surveillance. Therefore, it is recommended she continue to follow the cancer management and screening guidelines provided by her oncology and primary healthcare provider.  RECOMMENDATIONS FOR FAMILY MEMBERS:   Since she did not inherit a identifiable mutation in a cancer predisposition gene included on this panel, her children could not have inherited a known mutation from her in one of these genes. Individuals in this family might be at some increased risk of developing cancer, over the general population risk, due to the family history of cancer.  Individuals in the family should notify their providers of the family history of cancer. We recommend women in this family have a yearly mammogram beginning at age 32, or 64 years younger than the earliest onset of cancer, an annual clinical breast exam, and perform monthly breast  self-exams.  Family members should have colonoscopies by at age 55, or earlier, as recommended by their providers.  FOLLOW-UP:  Lastly, we discussed with Stacy [name] that cancer genetics is a rapidly advancing field and it is possible that new genetic tests will be appropriate for her and/or her family members in the future. We encouraged her to remain in contact with cancer genetics on an annual basis so we can update her personal and family histories and let her know of advances in cancer genetics  that may benefit this family.   Our contact number was provided. Stacy [name] questions were answered to her satisfaction, and she knows she is welcome to call us at anytime with additional questions or concerns.    Faith Rogue, MS, [facility] Genetic Counselor [email or web address] Phone: [phone]

## 2022-02-09 DIAGNOSIS — Z7982 Long term (current) use of aspirin: Secondary | ICD-10-CM | POA: Diagnosis not present

## 2022-02-09 DIAGNOSIS — H3530 Unspecified macular degeneration: Secondary | ICD-10-CM | POA: Diagnosis not present

## 2022-02-09 DIAGNOSIS — Z483 Aftercare following surgery for neoplasm: Secondary | ICD-10-CM | POA: Diagnosis not present

## 2022-02-09 DIAGNOSIS — Z170 Estrogen receptor positive status [ER+]: Secondary | ICD-10-CM | POA: Diagnosis not present

## 2022-02-09 DIAGNOSIS — E1169 Type 2 diabetes mellitus with other specified complication: Secondary | ICD-10-CM | POA: Diagnosis not present

## 2022-02-09 DIAGNOSIS — I10 Essential (primary) hypertension: Secondary | ICD-10-CM | POA: Diagnosis not present

## 2022-02-09 DIAGNOSIS — E785 Hyperlipidemia, unspecified: Secondary | ICD-10-CM | POA: Diagnosis not present

## 2022-02-09 DIAGNOSIS — Z7951 Long term (current) use of inhaled steroids: Secondary | ICD-10-CM | POA: Diagnosis not present

## 2022-02-09 DIAGNOSIS — C50212 Malignant neoplasm of upper-inner quadrant of left female breast: Secondary | ICD-10-CM | POA: Diagnosis not present

## 2022-02-12 ENCOUNTER — Telehealth: Payer: Self-pay | Admitting: Family Medicine

## 2022-02-12 DIAGNOSIS — Z170 Estrogen receptor positive status [ER+]: Secondary | ICD-10-CM | POA: Diagnosis not present

## 2022-02-12 DIAGNOSIS — C50212 Malignant neoplasm of upper-inner quadrant of left female breast: Secondary | ICD-10-CM | POA: Diagnosis not present

## 2022-02-12 NOTE — Telephone Encounter (Signed)
Given per Dr.Sowles.

## 2022-02-12 NOTE — Telephone Encounter (Signed)
Copied from [place] [id number]. Topic: General - Other >> Feb 12, 2022  1:40 PM Leone Payor F wrote: Reason for CRM: Home Health Verbal Orders - Caller/Agency: [facility] Number: [phone] Requesting PT  Frequency: [phone]

## 2022-02-13 ENCOUNTER — Encounter: Payer: Self-pay | Admitting: Oncology

## 2022-02-14 DIAGNOSIS — Z7982 Long term (current) use of aspirin: Secondary | ICD-10-CM | POA: Diagnosis not present

## 2022-02-14 DIAGNOSIS — Z7951 Long term (current) use of inhaled steroids: Secondary | ICD-10-CM | POA: Diagnosis not present

## 2022-02-14 DIAGNOSIS — E1169 Type 2 diabetes mellitus with other specified complication: Secondary | ICD-10-CM | POA: Diagnosis not present

## 2022-02-14 DIAGNOSIS — Z483 Aftercare following surgery for neoplasm: Secondary | ICD-10-CM | POA: Diagnosis not present

## 2022-02-14 DIAGNOSIS — H3530 Unspecified macular degeneration: Secondary | ICD-10-CM | POA: Diagnosis not present

## 2022-02-14 DIAGNOSIS — C50212 Malignant neoplasm of upper-inner quadrant of left female breast: Secondary | ICD-10-CM | POA: Diagnosis not present

## 2022-02-14 DIAGNOSIS — E785 Hyperlipidemia, unspecified: Secondary | ICD-10-CM | POA: Diagnosis not present

## 2022-02-14 DIAGNOSIS — Z170 Estrogen receptor positive status [ER+]: Secondary | ICD-10-CM | POA: Diagnosis not present

## 2022-02-14 DIAGNOSIS — I10 Essential (primary) hypertension: Secondary | ICD-10-CM | POA: Diagnosis not present

## 2022-02-15 DIAGNOSIS — Z7951 Long term (current) use of inhaled steroids: Secondary | ICD-10-CM | POA: Diagnosis not present

## 2022-02-15 DIAGNOSIS — E1169 Type 2 diabetes mellitus with other specified complication: Secondary | ICD-10-CM | POA: Diagnosis not present

## 2022-02-15 DIAGNOSIS — H3530 Unspecified macular degeneration: Secondary | ICD-10-CM | POA: Diagnosis not present

## 2022-02-15 DIAGNOSIS — Z483 Aftercare following surgery for neoplasm: Secondary | ICD-10-CM | POA: Diagnosis not present

## 2022-02-15 DIAGNOSIS — Z7982 Long term (current) use of aspirin: Secondary | ICD-10-CM | POA: Diagnosis not present

## 2022-02-15 DIAGNOSIS — I10 Essential (primary) hypertension: Secondary | ICD-10-CM | POA: Diagnosis not present

## 2022-02-15 DIAGNOSIS — Z170 Estrogen receptor positive status [ER+]: Secondary | ICD-10-CM | POA: Diagnosis not present

## 2022-02-15 DIAGNOSIS — E785 Hyperlipidemia, unspecified: Secondary | ICD-10-CM | POA: Diagnosis not present

## 2022-02-15 DIAGNOSIS — C50212 Malignant neoplasm of upper-inner quadrant of left female breast: Secondary | ICD-10-CM | POA: Diagnosis not present

## 2022-02-21 ENCOUNTER — Inpatient Hospital Stay: Payer: Medicare PPO | Attending: Oncology | Admitting: Oncology

## 2022-02-21 ENCOUNTER — Encounter: Payer: Self-pay | Admitting: Oncology

## 2022-02-21 VITALS — BP 161/85 | HR 70 | Temp 96.6°F | Resp 18 | Wt 196.2 lb

## 2022-02-21 DIAGNOSIS — I10 Essential (primary) hypertension: Secondary | ICD-10-CM | POA: Insufficient documentation

## 2022-02-21 DIAGNOSIS — Z803 Family history of malignant neoplasm of breast: Secondary | ICD-10-CM | POA: Insufficient documentation

## 2022-02-21 DIAGNOSIS — Z79811 Long term (current) use of aromatase inhibitors: Secondary | ICD-10-CM | POA: Diagnosis not present

## 2022-02-21 DIAGNOSIS — C50919 Malignant neoplasm of unspecified site of unspecified female breast: Secondary | ICD-10-CM | POA: Diagnosis not present

## 2022-02-21 DIAGNOSIS — Z800 Family history of malignant neoplasm of digestive organs: Secondary | ICD-10-CM | POA: Insufficient documentation

## 2022-02-21 DIAGNOSIS — Z170 Estrogen receptor positive status [ER+]: Secondary | ICD-10-CM | POA: Insufficient documentation

## 2022-02-21 DIAGNOSIS — Z8051 Family history of malignant neoplasm of kidney: Secondary | ICD-10-CM | POA: Diagnosis not present

## 2022-02-21 DIAGNOSIS — E785 Hyperlipidemia, unspecified: Secondary | ICD-10-CM | POA: Diagnosis not present

## 2022-02-21 DIAGNOSIS — C50212 Malignant neoplasm of upper-inner quadrant of left female breast: Secondary | ICD-10-CM | POA: Insufficient documentation

## 2022-02-21 DIAGNOSIS — Z8261 Family history of arthritis: Secondary | ICD-10-CM | POA: Insufficient documentation

## 2022-02-21 DIAGNOSIS — Z823 Family history of stroke: Secondary | ICD-10-CM | POA: Insufficient documentation

## 2022-02-21 DIAGNOSIS — N6322 Unspecified lump in the left breast, upper inner quadrant: Secondary | ICD-10-CM | POA: Diagnosis not present

## 2022-02-21 MED ORDER — ANASTROZOLE 1 MG PO TABS: 1.0000 mg | ORAL_TABLET | Freq: Every day | ORAL | 3 refills | Status: DC

## 2022-02-21 NOTE — Assessment & Plan Note (Signed)
Patient has had genetic testing done.  Negative.

## 2022-02-21 NOTE — Progress Notes (Signed)
Hematology/Oncology Progress note [phone] [phone]        CHIEF COMPLAINTS/PURPOSE OF CONSULTATION:  Left breast invasive carcinoma  ASSESSMENT & PLAN:   Cancer Staging  Invasive carcinoma of breast ([place]) Staging form: Breast, AJCC 8th Edition - Clinical: Stage IA (cT1c, cN0, cM0, G2, ER+, PR+, HER2-) - Signed by Earlie Server, MD on 01/16/2022   Invasive carcinoma of breast ([place]) Left breast invasive carcinoma, ER+. PR+. HER2- s/p mastectomy,  pT1c pN0, Oncotype DX 17 Pathology was reviewed and discussed with patient.  No need for adjuvant chemotherapy  07/2020 Normal bone density  I recommend adjuvant endocrine therapy. Rationale of using aromatase inhibitor -Arimidex '1mg'$  daily  discussed with patient.  Side effects of Arimidex including but not limited to hot flush, joint pain, fatigue, mood swing, osteoporosis discussed with patient. Patient voices understanding and willing to proceed.   Recommend calcium and vitamin D supplementation.   Family history of breast cancer Patient has had genetic testing done.  Negative.   Orders Placed This Encounter  Procedures   CBC with Differential/Platelet    Standing Status:   Future    Standing Expiration Date:   02/21/2023   Comprehensive metabolic panel    Standing Status:   Future    Standing Expiration Date:   02/21/2023   Follow-up 3 months for evaluation of tolerability. All questions were answered. The patient knows to call the clinic with any problems, questions or concerns.  Earlie Server, MD, PhD [facility] Hematology Oncology 02/21/2022    HISTORY OF PRESENTING ILLNESS:  Stacy [name] 77 y.o. female presents to establish care for Stage I left breast invasive carcinoma I have reviewed her chart and materials related to her cancer extensively  Summary of oncologic history is as follows: Oncology History  Invasive carcinoma of breast ([place])  11/21/2021 Mammogram   Bilateral screening mammogram  showed In the left breast, a possible mass warrants further evaluation. In the right breast, no findings suspicious for malignancy.   12/14/2021 Mammogram   Left unilateral diagnostic mammogram/ ultrasound showed Suspicious 1.2 cm mass over the 10 o'clock position of the left breast 4 cm from the nipple. No pathologically enlarged Left axilla lymph node    01/12/2022 Initial Diagnosis   Invasive carcinoma of breast   -Left breast lesion ultrasound-guided biopsy showed invasive mammary carcinoma with mucinous features, grade 2, DCIS negative, LVI negative ER 90%, PR 20%, HER2 IHC 2+ equivocal, FISH negative.  Menarche at age of 48-11 First live birth at age of 59 OCP use: 5 years Menopausal status: Postmenopausal, LMP in 37s. 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 No pathogenic variants identified on the [facility] Multi-Cancer+RNA panel. The report date is 02/04/2022.  The Multi-Cancer + RNA Panel offered by [facility] includes sequencing and/or deletion/duplication analysis of the following 70 genes:  AIP*, ALK, APC*, ATM*, AXIN2*, BAP1*, BARD1*, BLM*, BMPR1A*, BRCA1*, BRCA2*, BRIP1*, CDC73*, CDH1*, CDK4, CDKN1B*, CDKN2A, CHEK2*, CTNNA1*, DICER1*, EPCAM, EGFR, FH*, FLCN*, GREM1, HOXB13, KIT, LZTR1, MAX*, MBD4, MEN1*, MET, MITF, MLH1*,  MSH2*, MSH3*, MSH6*, MUTYH*, NF1*, NF2*, NTHL1*, PALB2*, PDGFRA, PMS2*, POLD1*, POLE*, POT1*, PRKAR1A*, PTCH1*, PTEN*, RAD51C*, RAD51D*, RB1*, RET, SDHA*, SDHAF2*, SDHB*,  SDHC*, SDHD*, SMAD4*, SMARCA4*, SMARCB1*, SMARCE1*, STK11*, SUFU*, TMEM127*, TP53*, TSC1*, TSC2*, VHL*. RNA analysis is performed for * genes.   01/31/2022 Surgery   S/p left simple mastectomy and SLNB  Invasive mammary carcinoma with focal mucinous features. Grade 2, LVI-, All margins negative for invasive carcinoma.  Four sentinel  lymph nodes negative for malignancy  pT1c pN0        INTERVAL HISTORY Alysia Scism is a 77 y.o. female who has above history reviewed by me today presents for follow up visit for Stage IA left breast invasive carcinoma, s/p left simple mastectomy and SLNB Patient present to discuss adjuvant plan. She has no concerns about surgical site.  No new complaints.  Accompanied by daughter.    MEDICAL HISTORY:  Past Medical History:  Diagnosis Date   Allergy    Arthritis    Cancer ([place])    GERD (gastroesophageal reflux disease)    Hyperlipidemia    Hypertension    Neoplasm of uncertain behavior of breast 2007   Pre-diabetes    Vertigo     SURGICAL HISTORY: Past Surgical History:  Procedure Laterality Date   BREAST BIOPSY Right 2015   3 stereo biopsies. 2 benign. one was radial scar, no excision done for radial scar   BREAST BIOPSY Left 07/16/2017   BIPHASIC STROMAL AND EPITHELIAL LESION./ Dr Bary Castilla   BREAST BIOPSY Left 08/23/2020   [place] bx of mass 5:00 2cmfn, coil marker, FIBROEPITHELIAL LESION WITH INTRACANALICULAR GROWTH PATTERN AND VARIABLE STROMAL HYPERCELLULARITY   BREAST BIOPSY Left 01/02/2022   [place] bx path pending 10:00 ribbon clip   BREAST BIOPSY Left 01/02/2022   [place] LT BREAST BX W LOC DEV 1ST LESION IMG BX SPEC US GUIDE 01/02/2022 [facility]   BREAST EXCISIONAL BIOPSY Left 1968   x 3 per pt   BREAST EXCISIONAL BIOPSY Right 2007   benign   BUNIONECTOMY Left    COLONOSCOPY  2011   DR,ISHAKIS   EXCISION OF BREAST LESION Left 10/07/2020   Procedure: EXCISION OF BREAST LESION;  Surgeon: Robert Bellow, MD;  Location: [facility]  ORS;  Service: General;  Laterality: Left; FIBROEPITHELIAL LESION WITH NTRACANALICULAR GROWTH PATTERN AND VARIABLE STROMAL   HAMMER TOE SURGERY Left    2nd toe   HEEL SPUR SURGERY Left 12/15/2014   Dr. Earleen Newport at [facility] Right    MASTECTOMY W/ SENTINEL NODE BIOPSY Left 01/31/2022   Procedure: MASTECTOMY WITH SENTINEL LYMPH NODE BIOPSY;  Surgeon: Herbert Pun, MD;  Location: [facility] ORS;  Service: General;  Laterality: Left;   TOTAL KNEE ARTHROPLASTY Left 06/08/2021   Procedure: TOTAL KNEE ARTHROPLASTY;  Surgeon: Hessie Knows, MD;  Location: [facility] ORS;  Service: Orthopedics;  Laterality: Left;    SOCIAL HISTORY: Social History   Socioeconomic History   Marital status: Married    Spouse name: Richard   Number of children: 1   Years of education: Not on file   Highest education level: 12th grade  Occupational History   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  Tobacco Use   Smoking status: Former    Packs/day: 0.25    Years: 2.00    Total pack years: 0.50    Types: Cigarettes    Quit date: 1974    Years since quitting: 50.1   Smokeless tobacco: Never  Tobacco comments:    smoking cessation materials not required  Vaping Use   Vaping Use: Never used  Substance and Sexual Activity   Alcohol use: No    Alcohol/week: 0.0 standard drinks of alcohol   Drug use: No   Sexual activity: Not Currently    Comment: Husband has prostate issues  Other Topics Concern   Not on file  Social History Narrative   Not on file   Social Determinants of Health   Financial Resource Strain: Low Risk  (02/15/2021)   Overall Financial Resource Strain (CARDIA)    Difficulty of Paying Living Expenses: Not hard at all  Food Insecurity: No Food Insecurity (12/22/2020)   Hunger Vital Sign    Worried About Running Out of Food in the Last Year: Never true    Ran Out of Food in the Last Year: Never true  Transportation Needs: No Transportation Needs (12/22/2020)    PRAPARE - Hydrologist (Medical): No    Lack of Transportation (Non-Medical): No  Physical Activity: Inactive (12/22/2020)   Exercise Vital Sign    Days of Exercise per Week: 0 days    Minutes of Exercise per Session: 0 min  Stress: No Stress Concern Present (12/22/2020)   [place]    Feeling of Stress : Not at all  Social Connections: [place] (12/22/2020)   Social Connection and Isolation Panel [NHANES]    Frequency of Communication with Friends and Family: More than three times a week    Frequency of Social Gatherings with Friends and Family: Once a week    Attends Religious Services: More than 4 times per year    Active Member of [employer] or Organizations: Yes    Attends Archivist Meetings: 1 to 4 times per year    Marital Status: Married  Human resources officer Violence: Not At Risk (12/22/2020)   Humiliation, Afraid, Rape, and Kick questionnaire    Fear of Current or Ex-Partner: No    Emotionally Abused: No    Physically Abused: No    Sexually Abused: No    FAMILY HISTORY: Family History  Problem Relation Age of Onset   Stroke Mother    Arthritis Mother    Breast cancer Sister 55   Breast cancer Sister    Cancer Brother 19       kidney   Kidney cancer Brother    Cancer Maternal Grandmother        Gallbladder   Stroke Paternal Grandmother     ALLERGIES:  is allergic to altace [ramipril].  MEDICATIONS:  Current Outpatient Medications  Medication Sig Dispense Refill   acetaminophen (TYLENOL) 500 MG tablet Take 500-1,000 mg by mouth every 6 (six) hours as needed (pain.).     aspirin 81 MG EC tablet Take 81 mg by mouth in the morning.     Cholecalciferol (VITAMIN D-3 PO) Take 2,000 Units by mouth in the morning.     CINNAMON PO Take 1,000 mg by mouth in the morning.     FISH OIL-BORAGE-FLAX-SAFFLOWER PO Take 1 capsule by mouth in the morning.     fluticasone  (FLONASE) 50 MCG/ACT nasal spray Place 2 sprays into both nostrils daily. (Patient taking differently: Place 2 sprays into both nostrils as needed.) 48 g 1   HYDROcodone-acetaminophen (NORCO/VICODIN) 5-325 MG tablet Take 1-2 tablets by mouth every 4 (four) hours as needed for moderate pain. 30 tablet 0   LIDOCAINE-MENTHOL EX Apply  1 application  topically 3 (three) times daily as needed (osteoarthritis pain.). [employer]'s Best Maximum Strength Topical Pain Relief Cream (4 % Lidocaine/Menthol)     loratadine (CLARITIN) 10 MG tablet Take 1 tablet (10 mg total) by mouth daily. (Patient taking differently: Take 10 mg by mouth daily as needed for allergies.) 90 tablet 1   meloxicam (MOBIC) 7.5 MG tablet Take 7.5 mg by mouth as needed.     Multiple Vitamin (MULTIVITAMIN) capsule Take 1 capsule by mouth daily.     rosuvastatin (CRESTOR) 5 MG tablet Take 1 tablet (5 mg total) by mouth every Monday, Wednesday, and Friday. 36 tablet 3   triamterene-hydrochlorothiazide (MAXZIDE-25) 37.5-25 MG tablet Take 0.5-1 tablets by mouth daily. (Patient taking differently: Take 0.5 tablets by mouth daily.) 90 tablet 1   No current facility-administered medications for this visit.    Review of Systems  Constitutional:  Negative for appetite change, chills, fatigue and fever.  HENT:   Negative for hearing loss and voice change.   Eyes:  Negative for eye problems.  Respiratory:  Negative for chest tightness and cough.   Cardiovascular:  Negative for chest pain.  Gastrointestinal:  Negative for abdominal distention, abdominal pain and blood in stool.  Endocrine: Negative for hot flashes.  Genitourinary:  Negative for difficulty urinating and frequency.   Musculoskeletal:  Negative for arthralgias.  Skin:  Negative for itching and rash.  Neurological:  Negative for extremity weakness.  Hematological:  Negative for adenopathy.  Psychiatric/Behavioral:  Negative for confusion.      PHYSICAL EXAMINATION: ECOG  PERFORMANCE STATUS: 0 - Asymptomatic  Vitals:   02/21/22 1017  BP: (!) 161/85  Pulse: 70  Resp: 18  Temp: (!) 96.6 F (35.9 C)   Filed Weights   02/21/22 1017  Weight: 196 lb 3.2 oz (89 kg)    Physical Exam Constitutional:      General: She is not in acute distress.    Appearance: She is not diaphoretic.  HENT:     Head: Normocephalic.     Nose: Nose normal.     Mouth/Throat:     Pharynx: No oropharyngeal exudate.  Eyes:     General: No scleral icterus.    Pupils: Pupils are equal, round, and reactive to light.  Cardiovascular:     Rate and Rhythm: Normal rate.  Pulmonary:     Effort: Pulmonary effort is normal. No respiratory distress.  Abdominal:     General: There is no distension.     Palpations: Abdomen is soft.  Musculoskeletal:        General: Normal range of motion.     Cervical back: Normal range of motion.  Skin:    General: Skin is warm and dry.     Findings: No erythema.  Neurological:     Mental Status: She is alert and oriented to person, place, and time. Mental status is at baseline.     Cranial Nerves: No cranial nerve deficit.     Motor: No abnormal muscle tone.  Psychiatric:        Mood and Affect: Affect normal.     LABORATORY DATA:  I have reviewed the data as listed    Latest Ref Rng & Units 02/01/2022    4:27 AM 01/16/2022   11:39 AM 09/14/2021   11:58 AM  CBC  WBC 4.0 - 10.5 K/uL 10.0  5.9  4.1   Hemoglobin 12.0 - 15.0 g/dL 11.3  11.9  12.6   Hematocrit 36.0 - 46.0 %  35.1  36.6  38.9   Platelets 150 - 400 K/uL 231  221  255       Latest Ref Rng & Units 02/01/2022    4:27 AM 01/16/2022   11:39 AM 09/14/2021   11:58 AM  CMP  Glucose 70 - 99 mg/dL 141  101  92   BUN 8 - 23 mg/dL '19  17  12   '$ Creatinine 0.44 - 1.00 mg/dL 0.80  0.72  0.78   Sodium 135 - 145 mmol/L 137  139  143   Potassium 3.5 - 5.1 mmol/L 3.5  3.6  4.2   Chloride 98 - 111 mmol/L 104  101  103   CO2 22 - 32 mmol/L '27  28  31   '$ Calcium 8.9 - 10.3 mg/dL 8.6  9.1  9.5    Total Protein 6.5 - 8.1 g/dL  7.8  7.3   Total Bilirubin 0.3 - 1.2 mg/dL  0.7  0.6   Alkaline Phos 38 - 126 U/L  62    AST 15 - 41 U/L  19  15   ALT 0 - 44 U/L  20  13      RADIOGRAPHIC STUDIES: I have personally reviewed the radiological images as listed and agreed with the findings in the report. NM Sentinel Node Inj-No Rpt (Breast)  Result Date: 01/31/2022 Sulfur Colloid was injected by the Nuclear Medicine Technologist for sentinel lymph node localization.

## 2022-02-21 NOTE — Assessment & Plan Note (Addendum)
Left breast invasive carcinoma, ER+. PR+. HER2- s/p mastectomy,  pT1c pN0, Oncotype DX 17 Pathology was reviewed and discussed with patient.  No need for adjuvant chemotherapy  07/2020 Normal bone density  I recommend adjuvant endocrine therapy. Rationale of using aromatase inhibitor -Arimidex '1mg'$  daily  discussed with patient.  Side effects of Arimidex including but not limited to hot flush, joint pain, fatigue, mood swing, osteoporosis discussed with patient. Patient voices understanding and willing to proceed.   Recommend calcium and vitamin D supplementation.

## 2022-02-22 ENCOUNTER — Encounter: Payer: Self-pay | Admitting: *Deleted

## 2022-02-22 DIAGNOSIS — I10 Essential (primary) hypertension: Secondary | ICD-10-CM | POA: Diagnosis not present

## 2022-02-22 DIAGNOSIS — Z170 Estrogen receptor positive status [ER+]: Secondary | ICD-10-CM | POA: Diagnosis not present

## 2022-02-22 DIAGNOSIS — C50212 Malignant neoplasm of upper-inner quadrant of left female breast: Secondary | ICD-10-CM | POA: Diagnosis not present

## 2022-02-22 DIAGNOSIS — Z7982 Long term (current) use of aspirin: Secondary | ICD-10-CM | POA: Diagnosis not present

## 2022-02-22 DIAGNOSIS — Z7951 Long term (current) use of inhaled steroids: Secondary | ICD-10-CM | POA: Diagnosis not present

## 2022-02-22 DIAGNOSIS — E785 Hyperlipidemia, unspecified: Secondary | ICD-10-CM | POA: Diagnosis not present

## 2022-02-22 DIAGNOSIS — E1169 Type 2 diabetes mellitus with other specified complication: Secondary | ICD-10-CM | POA: Diagnosis not present

## 2022-02-22 DIAGNOSIS — Z483 Aftercare following surgery for neoplasm: Secondary | ICD-10-CM | POA: Diagnosis not present

## 2022-02-22 DIAGNOSIS — H3530 Unspecified macular degeneration: Secondary | ICD-10-CM | POA: Diagnosis not present

## 2022-02-23 ENCOUNTER — Telehealth: Payer: Self-pay | Admitting: Family Medicine

## 2022-02-23 DIAGNOSIS — Z7951 Long term (current) use of inhaled steroids: Secondary | ICD-10-CM | POA: Diagnosis not present

## 2022-02-23 DIAGNOSIS — E785 Hyperlipidemia, unspecified: Secondary | ICD-10-CM | POA: Diagnosis not present

## 2022-02-23 DIAGNOSIS — Z483 Aftercare following surgery for neoplasm: Secondary | ICD-10-CM | POA: Diagnosis not present

## 2022-02-23 DIAGNOSIS — H3530 Unspecified macular degeneration: Secondary | ICD-10-CM | POA: Diagnosis not present

## 2022-02-23 DIAGNOSIS — I10 Essential (primary) hypertension: Secondary | ICD-10-CM | POA: Diagnosis not present

## 2022-02-23 DIAGNOSIS — Z7982 Long term (current) use of aspirin: Secondary | ICD-10-CM | POA: Diagnosis not present

## 2022-02-23 DIAGNOSIS — C50212 Malignant neoplasm of upper-inner quadrant of left female breast: Secondary | ICD-10-CM | POA: Diagnosis not present

## 2022-02-23 DIAGNOSIS — E1169 Type 2 diabetes mellitus with other specified complication: Secondary | ICD-10-CM | POA: Diagnosis not present

## 2022-02-23 DIAGNOSIS — Z170 Estrogen receptor positive status [ER+]: Secondary | ICD-10-CM | POA: Diagnosis not present

## 2022-02-23 NOTE — Telephone Encounter (Signed)
Completed.

## 2022-02-23 NOTE — Telephone Encounter (Signed)
Home Health Verbal Orders - Caller/Agency: [facility]/ [facility]  Callback Number: [phone] vm can be left  Requesting OT Frequency: 1 x a week for 5 weeks

## 2022-02-27 ENCOUNTER — Telehealth: Payer: Self-pay | Admitting: Licensed Clinical Social Worker

## 2022-02-27 DIAGNOSIS — E1169 Type 2 diabetes mellitus with other specified complication: Secondary | ICD-10-CM | POA: Diagnosis not present

## 2022-02-27 DIAGNOSIS — Z483 Aftercare following surgery for neoplasm: Secondary | ICD-10-CM | POA: Diagnosis not present

## 2022-02-27 DIAGNOSIS — E785 Hyperlipidemia, unspecified: Secondary | ICD-10-CM | POA: Diagnosis not present

## 2022-02-27 DIAGNOSIS — Z170 Estrogen receptor positive status [ER+]: Secondary | ICD-10-CM | POA: Diagnosis not present

## 2022-02-27 DIAGNOSIS — Z7982 Long term (current) use of aspirin: Secondary | ICD-10-CM | POA: Diagnosis not present

## 2022-02-27 DIAGNOSIS — I10 Essential (primary) hypertension: Secondary | ICD-10-CM | POA: Diagnosis not present

## 2022-02-27 DIAGNOSIS — Z7951 Long term (current) use of inhaled steroids: Secondary | ICD-10-CM | POA: Diagnosis not present

## 2022-02-27 DIAGNOSIS — H3530 Unspecified macular degeneration: Secondary | ICD-10-CM | POA: Diagnosis not present

## 2022-02-27 DIAGNOSIS — C50212 Malignant neoplasm of upper-inner quadrant of left female breast: Secondary | ICD-10-CM | POA: Diagnosis not present

## 2022-02-27 NOTE — Telephone Encounter (Signed)
Patient called with questions as she received letter from [facility] that said genetic test would not be covered. I reassured her that the lab usually takes care of that and will appeal on her behalf. I will reach out to [facility] to confirm.

## 2022-02-28 DIAGNOSIS — H3530 Unspecified macular degeneration: Secondary | ICD-10-CM | POA: Diagnosis not present

## 2022-02-28 DIAGNOSIS — Z483 Aftercare following surgery for neoplasm: Secondary | ICD-10-CM | POA: Diagnosis not present

## 2022-02-28 DIAGNOSIS — C50212 Malignant neoplasm of upper-inner quadrant of left female breast: Secondary | ICD-10-CM | POA: Diagnosis not present

## 2022-02-28 DIAGNOSIS — E785 Hyperlipidemia, unspecified: Secondary | ICD-10-CM | POA: Diagnosis not present

## 2022-02-28 DIAGNOSIS — I10 Essential (primary) hypertension: Secondary | ICD-10-CM | POA: Diagnosis not present

## 2022-02-28 DIAGNOSIS — Z170 Estrogen receptor positive status [ER+]: Secondary | ICD-10-CM | POA: Diagnosis not present

## 2022-02-28 DIAGNOSIS — Z7951 Long term (current) use of inhaled steroids: Secondary | ICD-10-CM | POA: Diagnosis not present

## 2022-02-28 DIAGNOSIS — E1169 Type 2 diabetes mellitus with other specified complication: Secondary | ICD-10-CM | POA: Diagnosis not present

## 2022-02-28 DIAGNOSIS — Z7982 Long term (current) use of aspirin: Secondary | ICD-10-CM | POA: Diagnosis not present

## 2022-03-02 DIAGNOSIS — Z7982 Long term (current) use of aspirin: Secondary | ICD-10-CM | POA: Diagnosis not present

## 2022-03-02 DIAGNOSIS — I10 Essential (primary) hypertension: Secondary | ICD-10-CM | POA: Diagnosis not present

## 2022-03-02 DIAGNOSIS — E785 Hyperlipidemia, unspecified: Secondary | ICD-10-CM | POA: Diagnosis not present

## 2022-03-02 DIAGNOSIS — C50212 Malignant neoplasm of upper-inner quadrant of left female breast: Secondary | ICD-10-CM | POA: Diagnosis not present

## 2022-03-02 DIAGNOSIS — Z483 Aftercare following surgery for neoplasm: Secondary | ICD-10-CM | POA: Diagnosis not present

## 2022-03-02 DIAGNOSIS — E1169 Type 2 diabetes mellitus with other specified complication: Secondary | ICD-10-CM | POA: Diagnosis not present

## 2022-03-02 DIAGNOSIS — H3530 Unspecified macular degeneration: Secondary | ICD-10-CM | POA: Diagnosis not present

## 2022-03-02 DIAGNOSIS — Z7951 Long term (current) use of inhaled steroids: Secondary | ICD-10-CM | POA: Diagnosis not present

## 2022-03-02 DIAGNOSIS — Z170 Estrogen receptor positive status [ER+]: Secondary | ICD-10-CM | POA: Diagnosis not present

## 2022-03-04 DIAGNOSIS — E785 Hyperlipidemia, unspecified: Secondary | ICD-10-CM | POA: Diagnosis not present

## 2022-03-04 DIAGNOSIS — I10 Essential (primary) hypertension: Secondary | ICD-10-CM | POA: Diagnosis not present

## 2022-03-04 DIAGNOSIS — Z170 Estrogen receptor positive status [ER+]: Secondary | ICD-10-CM | POA: Diagnosis not present

## 2022-03-04 DIAGNOSIS — H3530 Unspecified macular degeneration: Secondary | ICD-10-CM | POA: Diagnosis not present

## 2022-03-04 DIAGNOSIS — E1169 Type 2 diabetes mellitus with other specified complication: Secondary | ICD-10-CM | POA: Diagnosis not present

## 2022-03-04 DIAGNOSIS — Z483 Aftercare following surgery for neoplasm: Secondary | ICD-10-CM | POA: Diagnosis not present

## 2022-03-04 DIAGNOSIS — C50212 Malignant neoplasm of upper-inner quadrant of left female breast: Secondary | ICD-10-CM | POA: Diagnosis not present

## 2022-03-04 DIAGNOSIS — Z7951 Long term (current) use of inhaled steroids: Secondary | ICD-10-CM | POA: Diagnosis not present

## 2022-03-04 DIAGNOSIS — Z7982 Long term (current) use of aspirin: Secondary | ICD-10-CM | POA: Diagnosis not present

## 2022-03-07 DIAGNOSIS — H3530 Unspecified macular degeneration: Secondary | ICD-10-CM | POA: Diagnosis not present

## 2022-03-07 DIAGNOSIS — Z7982 Long term (current) use of aspirin: Secondary | ICD-10-CM | POA: Diagnosis not present

## 2022-03-07 DIAGNOSIS — Z170 Estrogen receptor positive status [ER+]: Secondary | ICD-10-CM | POA: Diagnosis not present

## 2022-03-07 DIAGNOSIS — C50212 Malignant neoplasm of upper-inner quadrant of left female breast: Secondary | ICD-10-CM | POA: Diagnosis not present

## 2022-03-07 DIAGNOSIS — E1169 Type 2 diabetes mellitus with other specified complication: Secondary | ICD-10-CM | POA: Diagnosis not present

## 2022-03-07 DIAGNOSIS — Z483 Aftercare following surgery for neoplasm: Secondary | ICD-10-CM | POA: Diagnosis not present

## 2022-03-07 DIAGNOSIS — I10 Essential (primary) hypertension: Secondary | ICD-10-CM | POA: Diagnosis not present

## 2022-03-07 DIAGNOSIS — Z7951 Long term (current) use of inhaled steroids: Secondary | ICD-10-CM | POA: Diagnosis not present

## 2022-03-07 DIAGNOSIS — E785 Hyperlipidemia, unspecified: Secondary | ICD-10-CM | POA: Diagnosis not present

## 2022-03-09 DIAGNOSIS — Z483 Aftercare following surgery for neoplasm: Secondary | ICD-10-CM | POA: Diagnosis not present

## 2022-03-09 DIAGNOSIS — E785 Hyperlipidemia, unspecified: Secondary | ICD-10-CM | POA: Diagnosis not present

## 2022-03-09 DIAGNOSIS — Z7951 Long term (current) use of inhaled steroids: Secondary | ICD-10-CM | POA: Diagnosis not present

## 2022-03-09 DIAGNOSIS — I10 Essential (primary) hypertension: Secondary | ICD-10-CM | POA: Diagnosis not present

## 2022-03-09 DIAGNOSIS — H3530 Unspecified macular degeneration: Secondary | ICD-10-CM | POA: Diagnosis not present

## 2022-03-09 DIAGNOSIS — C50212 Malignant neoplasm of upper-inner quadrant of left female breast: Secondary | ICD-10-CM | POA: Diagnosis not present

## 2022-03-09 DIAGNOSIS — Z7982 Long term (current) use of aspirin: Secondary | ICD-10-CM | POA: Diagnosis not present

## 2022-03-09 DIAGNOSIS — Z170 Estrogen receptor positive status [ER+]: Secondary | ICD-10-CM | POA: Diagnosis not present

## 2022-03-09 DIAGNOSIS — E1169 Type 2 diabetes mellitus with other specified complication: Secondary | ICD-10-CM | POA: Diagnosis not present

## 2022-03-14 ENCOUNTER — Telehealth: Payer: Self-pay | Admitting: Family Medicine

## 2022-03-14 DIAGNOSIS — E1169 Type 2 diabetes mellitus with other specified complication: Secondary | ICD-10-CM | POA: Diagnosis not present

## 2022-03-14 DIAGNOSIS — Z7982 Long term (current) use of aspirin: Secondary | ICD-10-CM | POA: Diagnosis not present

## 2022-03-14 DIAGNOSIS — C50212 Malignant neoplasm of upper-inner quadrant of left female breast: Secondary | ICD-10-CM | POA: Diagnosis not present

## 2022-03-14 DIAGNOSIS — Z170 Estrogen receptor positive status [ER+]: Secondary | ICD-10-CM | POA: Diagnosis not present

## 2022-03-14 DIAGNOSIS — H3530 Unspecified macular degeneration: Secondary | ICD-10-CM | POA: Diagnosis not present

## 2022-03-14 DIAGNOSIS — E785 Hyperlipidemia, unspecified: Secondary | ICD-10-CM | POA: Diagnosis not present

## 2022-03-14 DIAGNOSIS — Z483 Aftercare following surgery for neoplasm: Secondary | ICD-10-CM | POA: Diagnosis not present

## 2022-03-14 DIAGNOSIS — I10 Essential (primary) hypertension: Secondary | ICD-10-CM | POA: Diagnosis not present

## 2022-03-14 DIAGNOSIS — Z7951 Long term (current) use of inhaled steroids: Secondary | ICD-10-CM | POA: Diagnosis not present

## 2022-03-14 NOTE — Telephone Encounter (Signed)
Copied from [place] [phone]. Topic: General - Inquiry >> Mar 14, 2022 11:24 AM Erskine Squibb wrote: Reason for CRM: The patient received a call from a company she does not know asking if she wants any at home covid tests and she just wanted her provider to know in case they call that she did not request this nor wants them.

## 2022-03-15 DIAGNOSIS — Z170 Estrogen receptor positive status [ER+]: Secondary | ICD-10-CM | POA: Diagnosis not present

## 2022-03-15 DIAGNOSIS — Z483 Aftercare following surgery for neoplasm: Secondary | ICD-10-CM | POA: Diagnosis not present

## 2022-03-15 DIAGNOSIS — E785 Hyperlipidemia, unspecified: Secondary | ICD-10-CM | POA: Diagnosis not present

## 2022-03-15 DIAGNOSIS — Z7982 Long term (current) use of aspirin: Secondary | ICD-10-CM | POA: Diagnosis not present

## 2022-03-15 DIAGNOSIS — H3530 Unspecified macular degeneration: Secondary | ICD-10-CM | POA: Diagnosis not present

## 2022-03-15 DIAGNOSIS — Z7951 Long term (current) use of inhaled steroids: Secondary | ICD-10-CM | POA: Diagnosis not present

## 2022-03-15 DIAGNOSIS — I10 Essential (primary) hypertension: Secondary | ICD-10-CM | POA: Diagnosis not present

## 2022-03-15 DIAGNOSIS — C50212 Malignant neoplasm of upper-inner quadrant of left female breast: Secondary | ICD-10-CM | POA: Diagnosis not present

## 2022-03-15 DIAGNOSIS — E1169 Type 2 diabetes mellitus with other specified complication: Secondary | ICD-10-CM | POA: Diagnosis not present

## 2022-03-20 DIAGNOSIS — Z7951 Long term (current) use of inhaled steroids: Secondary | ICD-10-CM | POA: Diagnosis not present

## 2022-03-20 DIAGNOSIS — I10 Essential (primary) hypertension: Secondary | ICD-10-CM | POA: Diagnosis not present

## 2022-03-20 DIAGNOSIS — C50212 Malignant neoplasm of upper-inner quadrant of left female breast: Secondary | ICD-10-CM | POA: Diagnosis not present

## 2022-03-20 DIAGNOSIS — H3530 Unspecified macular degeneration: Secondary | ICD-10-CM | POA: Diagnosis not present

## 2022-03-20 DIAGNOSIS — Z483 Aftercare following surgery for neoplasm: Secondary | ICD-10-CM | POA: Diagnosis not present

## 2022-03-20 DIAGNOSIS — Z7982 Long term (current) use of aspirin: Secondary | ICD-10-CM | POA: Diagnosis not present

## 2022-03-20 DIAGNOSIS — Z170 Estrogen receptor positive status [ER+]: Secondary | ICD-10-CM | POA: Diagnosis not present

## 2022-03-20 DIAGNOSIS — E785 Hyperlipidemia, unspecified: Secondary | ICD-10-CM | POA: Diagnosis not present

## 2022-03-20 DIAGNOSIS — E1169 Type 2 diabetes mellitus with other specified complication: Secondary | ICD-10-CM | POA: Diagnosis not present

## 2022-03-21 DIAGNOSIS — M1712 Unilateral primary osteoarthritis, left knee: Secondary | ICD-10-CM | POA: Diagnosis not present

## 2022-03-21 DIAGNOSIS — Z96652 Presence of left artificial knee joint: Secondary | ICD-10-CM | POA: Diagnosis not present

## 2022-03-23 DIAGNOSIS — Z7951 Long term (current) use of inhaled steroids: Secondary | ICD-10-CM | POA: Diagnosis not present

## 2022-03-23 DIAGNOSIS — Z7982 Long term (current) use of aspirin: Secondary | ICD-10-CM | POA: Diagnosis not present

## 2022-03-23 DIAGNOSIS — Z483 Aftercare following surgery for neoplasm: Secondary | ICD-10-CM | POA: Diagnosis not present

## 2022-03-23 DIAGNOSIS — C50212 Malignant neoplasm of upper-inner quadrant of left female breast: Secondary | ICD-10-CM | POA: Diagnosis not present

## 2022-03-23 DIAGNOSIS — I10 Essential (primary) hypertension: Secondary | ICD-10-CM | POA: Diagnosis not present

## 2022-03-23 DIAGNOSIS — Z170 Estrogen receptor positive status [ER+]: Secondary | ICD-10-CM | POA: Diagnosis not present

## 2022-03-23 DIAGNOSIS — H3530 Unspecified macular degeneration: Secondary | ICD-10-CM | POA: Diagnosis not present

## 2022-03-23 DIAGNOSIS — E1169 Type 2 diabetes mellitus with other specified complication: Secondary | ICD-10-CM | POA: Diagnosis not present

## 2022-03-23 DIAGNOSIS — E785 Hyperlipidemia, unspecified: Secondary | ICD-10-CM | POA: Diagnosis not present

## 2022-03-26 DIAGNOSIS — H04123 Dry eye syndrome of bilateral lacrimal glands: Secondary | ICD-10-CM | POA: Diagnosis not present

## 2022-03-26 DIAGNOSIS — R7309 Other abnormal glucose: Secondary | ICD-10-CM | POA: Diagnosis not present

## 2022-03-26 DIAGNOSIS — H1045 Other chronic allergic conjunctivitis: Secondary | ICD-10-CM | POA: Diagnosis not present

## 2022-03-26 DIAGNOSIS — H524 Presbyopia: Secondary | ICD-10-CM | POA: Diagnosis not present

## 2022-03-26 DIAGNOSIS — H52223 Regular astigmatism, bilateral: Secondary | ICD-10-CM | POA: Diagnosis not present

## 2022-03-26 DIAGNOSIS — H5203 Hypermetropia, bilateral: Secondary | ICD-10-CM | POA: Diagnosis not present

## 2022-03-26 DIAGNOSIS — H353131 Nonexudative age-related macular degeneration, bilateral, early dry stage: Secondary | ICD-10-CM | POA: Diagnosis not present

## 2022-03-26 DIAGNOSIS — H2513 Age-related nuclear cataract, bilateral: Secondary | ICD-10-CM | POA: Diagnosis not present

## 2022-03-26 DIAGNOSIS — H25013 Cortical age-related cataract, bilateral: Secondary | ICD-10-CM | POA: Diagnosis not present

## 2022-03-28 DIAGNOSIS — H3530 Unspecified macular degeneration: Secondary | ICD-10-CM | POA: Diagnosis not present

## 2022-03-28 DIAGNOSIS — C50212 Malignant neoplasm of upper-inner quadrant of left female breast: Secondary | ICD-10-CM | POA: Diagnosis not present

## 2022-03-28 DIAGNOSIS — Z483 Aftercare following surgery for neoplasm: Secondary | ICD-10-CM | POA: Diagnosis not present

## 2022-03-28 DIAGNOSIS — Z170 Estrogen receptor positive status [ER+]: Secondary | ICD-10-CM | POA: Diagnosis not present

## 2022-03-28 DIAGNOSIS — Z7982 Long term (current) use of aspirin: Secondary | ICD-10-CM | POA: Diagnosis not present

## 2022-03-28 DIAGNOSIS — E785 Hyperlipidemia, unspecified: Secondary | ICD-10-CM | POA: Diagnosis not present

## 2022-03-28 DIAGNOSIS — E1169 Type 2 diabetes mellitus with other specified complication: Secondary | ICD-10-CM | POA: Diagnosis not present

## 2022-03-28 DIAGNOSIS — I10 Essential (primary) hypertension: Secondary | ICD-10-CM | POA: Diagnosis not present

## 2022-03-28 DIAGNOSIS — Z7951 Long term (current) use of inhaled steroids: Secondary | ICD-10-CM | POA: Diagnosis not present

## 2022-03-29 DIAGNOSIS — Z170 Estrogen receptor positive status [ER+]: Secondary | ICD-10-CM | POA: Diagnosis not present

## 2022-03-29 DIAGNOSIS — C50212 Malignant neoplasm of upper-inner quadrant of left female breast: Secondary | ICD-10-CM | POA: Diagnosis not present

## 2022-03-29 DIAGNOSIS — Z9012 Acquired absence of left breast and nipple: Secondary | ICD-10-CM | POA: Diagnosis not present

## 2022-03-30 DIAGNOSIS — Z7982 Long term (current) use of aspirin: Secondary | ICD-10-CM | POA: Diagnosis not present

## 2022-03-30 DIAGNOSIS — I10 Essential (primary) hypertension: Secondary | ICD-10-CM | POA: Diagnosis not present

## 2022-03-30 DIAGNOSIS — E1169 Type 2 diabetes mellitus with other specified complication: Secondary | ICD-10-CM | POA: Diagnosis not present

## 2022-03-30 DIAGNOSIS — Z7951 Long term (current) use of inhaled steroids: Secondary | ICD-10-CM | POA: Diagnosis not present

## 2022-03-30 DIAGNOSIS — C50212 Malignant neoplasm of upper-inner quadrant of left female breast: Secondary | ICD-10-CM | POA: Diagnosis not present

## 2022-03-30 DIAGNOSIS — Z483 Aftercare following surgery for neoplasm: Secondary | ICD-10-CM | POA: Diagnosis not present

## 2022-03-30 DIAGNOSIS — Z170 Estrogen receptor positive status [ER+]: Secondary | ICD-10-CM | POA: Diagnosis not present

## 2022-03-30 DIAGNOSIS — E785 Hyperlipidemia, unspecified: Secondary | ICD-10-CM | POA: Diagnosis not present

## 2022-03-30 DIAGNOSIS — H3530 Unspecified macular degeneration: Secondary | ICD-10-CM | POA: Diagnosis not present

## 2022-04-19 ENCOUNTER — Ambulatory Visit (INDEPENDENT_AMBULATORY_CARE_PROVIDER_SITE_OTHER): Payer: Medicare PPO | Admitting: Podiatry

## 2022-04-19 ENCOUNTER — Encounter: Payer: Self-pay | Admitting: Podiatry

## 2022-04-19 DIAGNOSIS — E119 Type 2 diabetes mellitus without complications: Secondary | ICD-10-CM

## 2022-04-19 DIAGNOSIS — B351 Tinea unguium: Secondary | ICD-10-CM | POA: Diagnosis not present

## 2022-04-19 DIAGNOSIS — M79674 Pain in right toe(s): Secondary | ICD-10-CM

## 2022-04-19 DIAGNOSIS — M79675 Pain in left toe(s): Secondary | ICD-10-CM

## 2022-04-19 NOTE — Progress Notes (Signed)
This patient returns to my office for at risk foot care.  This patient requires this care by a professional since this patient will be at risk due to having diabetes.  This patient is unable to cut nails herself since the patient cannot reach her nails.These nails are painful walking and wearing shoes.  This patient presents for at risk foot care today.  General Appearance  Alert, conversant and in no acute stress.  Vascular  Dorsalis pedis and posterior tibial  pulses are palpable  bilaterally.  Capillary return is within normal limits  bilaterally. Temperature is within normal limits  bilaterally.  Neurologic  Senn-Weinstein monofilament wire test within normal limits  bilaterally. Muscle power within normal limits bilaterally.  Nails Thick disfigured discolored nails with subungual debris  from hallux to fifth toes bilaterally. No evidence of bacterial infection or drainage bilaterally.  Orthopedic  No limitations of motion  feet .  No crepitus or effusions noted.  No bony pathology or digital deformities noted.  Skin  normotropic skin with no porokeratosis noted bilaterally.  No signs of infections or ulcers noted.     Onychomycosis  Pain in right toes  Pain in left toes  Consent was obtained for treatment procedures.   Mechanical debridement of nails 1-5  bilaterally performed with a nail nipper.  Filed with dremel without incident. No infection or ulcer.     Return office visit    10 weeks                 Told patient to return for periodic foot care and evaluation due to potential at risk complications.   Melda Mermelstein DPM  

## 2022-05-04 DIAGNOSIS — H2511 Age-related nuclear cataract, right eye: Secondary | ICD-10-CM | POA: Diagnosis not present

## 2022-05-04 DIAGNOSIS — H2513 Age-related nuclear cataract, bilateral: Secondary | ICD-10-CM | POA: Diagnosis not present

## 2022-05-22 ENCOUNTER — Ambulatory Visit: Payer: Medicare PPO | Admitting: Oncology

## 2022-05-22 ENCOUNTER — Other Ambulatory Visit: Payer: Medicare PPO

## 2022-05-23 ENCOUNTER — Inpatient Hospital Stay (HOSPITAL_BASED_OUTPATIENT_CLINIC_OR_DEPARTMENT_OTHER): Payer: Medicare PPO | Admitting: Oncology

## 2022-05-23 ENCOUNTER — Encounter: Payer: Self-pay | Admitting: Oncology

## 2022-05-23 ENCOUNTER — Inpatient Hospital Stay: Payer: Medicare PPO | Attending: Oncology

## 2022-05-23 VITALS — BP 178/79 | HR 82 | Temp 96.1°F | Resp 18 | Wt 196.7 lb

## 2022-05-23 DIAGNOSIS — Z170 Estrogen receptor positive status [ER+]: Secondary | ICD-10-CM | POA: Insufficient documentation

## 2022-05-23 DIAGNOSIS — Z79899 Other long term (current) drug therapy: Secondary | ICD-10-CM | POA: Insufficient documentation

## 2022-05-23 DIAGNOSIS — Z79811 Long term (current) use of aromatase inhibitors: Secondary | ICD-10-CM | POA: Diagnosis not present

## 2022-05-23 DIAGNOSIS — C50212 Malignant neoplasm of upper-inner quadrant of left female breast: Secondary | ICD-10-CM | POA: Diagnosis not present

## 2022-05-23 DIAGNOSIS — Z823 Family history of stroke: Secondary | ICD-10-CM | POA: Diagnosis not present

## 2022-05-23 DIAGNOSIS — Z87891 Personal history of nicotine dependence: Secondary | ICD-10-CM | POA: Diagnosis not present

## 2022-05-23 DIAGNOSIS — M2550 Pain in unspecified joint: Secondary | ICD-10-CM | POA: Insufficient documentation

## 2022-05-23 DIAGNOSIS — Z8051 Family history of malignant neoplasm of kidney: Secondary | ICD-10-CM | POA: Insufficient documentation

## 2022-05-23 DIAGNOSIS — C50919 Malignant neoplasm of unspecified site of unspecified female breast: Secondary | ICD-10-CM

## 2022-05-23 DIAGNOSIS — Z8261 Family history of arthritis: Secondary | ICD-10-CM | POA: Diagnosis not present

## 2022-05-23 DIAGNOSIS — Z808 Family history of malignant neoplasm of other organs or systems: Secondary | ICD-10-CM | POA: Diagnosis not present

## 2022-05-23 DIAGNOSIS — I10 Essential (primary) hypertension: Secondary | ICD-10-CM | POA: Diagnosis not present

## 2022-05-23 DIAGNOSIS — Z803 Family history of malignant neoplasm of breast: Secondary | ICD-10-CM | POA: Insufficient documentation

## 2022-05-23 LAB — COMPREHENSIVE METABOLIC PANEL
ALT: 19 U/L (ref 0–44)
AST: 22 U/L (ref 15–41)
Albumin: 4.1 g/dL (ref 3.5–5.0)
Alkaline Phosphatase: 53 U/L (ref 38–126)
Anion gap: 8 (ref 5–15)
BUN: 13 mg/dL (ref 8–23)
CO2: 28 mmol/L (ref 22–32)
Calcium: 9.3 mg/dL (ref 8.9–10.3)
Chloride: 103 mmol/L (ref 98–111)
Creatinine, Ser: 0.8 mg/dL (ref 0.44–1.00)
GFR, Estimated: >60 mL/min (ref >60)
Glucose, Bld: 116 mg/dL — ABNORMAL HIGH (ref 70–99)
Potassium: 3.7 mmol/L (ref 3.5–5.1)
Sodium: 139 mmol/L (ref 135–145)
Total Bilirubin: 0.8 mg/dL (ref 0.3–1.2)
Total Protein: 7.7 g/dL (ref 6.5–8.1)

## 2022-05-23 LAB — CBC WITH DIFFERENTIAL/PLATELET
Abs Immature Granulocytes: 0.01 10*3/uL (ref 0.00–0.07)
Basophils Absolute: 0 10*3/uL (ref 0.0–0.1)
Basophils Relative: 1 %
Eosinophils Absolute: 0.2 10*3/uL (ref 0.0–0.5)
Eosinophils Relative: 6 %
HCT: 37.9 % (ref 36.0–46.0)
Hemoglobin: 12 g/dL (ref 12.0–15.0)
Immature Granulocytes: 0 %
Lymphocytes Relative: 38 %
Lymphs Abs: 1.4 10*3/uL (ref 0.7–4.0)
MCH: 27.5 pg (ref 26.0–34.0)
MCHC: 31.7 g/dL (ref 30.0–36.0)
MCV: 86.9 fL (ref 80.0–100.0)
Monocytes Absolute: 0.4 10*3/uL (ref 0.1–1.0)
Monocytes Relative: 10 %
Neutro Abs: 1.7 10*3/uL (ref 1.7–7.7)
Neutrophils Relative %: 45 %
Platelets: 222 10*3/uL (ref 150–400)
RBC: 4.36 MIL/uL (ref 3.87–5.11)
RDW: 14.1 % (ref 11.5–15.5)
WBC: 3.7 10*3/uL — ABNORMAL LOW (ref 4.0–10.5)
nRBC: 0 % (ref 0.0–0.2)

## 2022-05-23 MED ORDER — ANASTROZOLE 1 MG PO TABS: 1.0000 mg | ORAL_TABLET | Freq: Every day | ORAL | 1 refills | Status: DC

## 2022-05-23 NOTE — Assessment & Plan Note (Signed)
Likely due to osteoarthritis, pre-existing, worsen with AI Recommend topical Voltaren.

## 2022-05-23 NOTE — Progress Notes (Signed)
Survivorship Care Plan visit completed.  Treatment summary reviewed and given to patient.  ASCO answers booklet reviewed and given to patient.  CARE program and Cancer Transitions discussed with patient along with other resources cancer center offers to patients and caregivers.  Patient verbalized understanding.    

## 2022-05-23 NOTE — Assessment & Plan Note (Signed)
Will repeat DEXA this year Recommend calcium and vitamin D supplementation.

## 2022-05-23 NOTE — Progress Notes (Signed)
Hematology/Oncology Progress note [phone] [phone]        CHIEF COMPLAINTS/PURPOSE OF CONSULTATION:  Left breast invasive carcinoma  ASSESSMENT & PLAN:   Cancer Staging  Invasive carcinoma of breast (HCC) Staging form: Breast, AJCC 8th Edition - Clinical: Stage IA (cT1c, cN0, cM0, G2, ER+, PR+, HER2-) - Signed by [name], MD on 01/16/2022 - Pathologic stage from 01/31/2022: Stage IA (pT1c, pN0, cM0, G2, ER+, PR+, HER2-, Oncotype DX score: 17) - Signed by [name], MD on 02/21/2022   Invasive carcinoma of breast (HCC) Left breast invasive carcinoma, ER+. PR+. HER2- s/p mastectomy,  pT1c pN0, Oncotype DX 17 Pathology was reviewed and discussed with patient.  No need for adjuvant chemotherapy  07/2020 Normal bone density  Labs are reviewed and discussed with patient. She tolerates with manageable side effects.  Continue Arimidex 1mg  daily.  Annual right screening mammogram - Nov 2024  Daughter was called and updated.     Aromatase inhibitor use Will repeat DEXA this year Recommend calcium and vitamin D supplementation.   Arthralgia Likely due to osteoarthritis, pre-existing, worsen with AI Recommend topical Voltaren.    Orders Placed This Encounter  Procedures   DG Bone Density    Standing Status:   Future    Standing Expiration Date:   05/23/2023    Order Specific Question:   Reason for Exam (SYMPTOM  OR DIAGNOSIS REQUIRED)    Answer:   breast cancer, AI use    Order Specific Question:   Preferred imaging location?    Answer:   [facility]   MM 3D SCREENING MAMMOGRAM UNILATERAL RIGHT BREAST    Standing Status:   Future    Standing Expiration Date:   05/23/2023    Order Specific Question:   Reason for Exam (SYMPTOM  OR DIAGNOSIS REQUIRED)    Answer:   breast cancer    Order Specific Question:   Preferred imaging location?    Answer:   [facility]   CBC with Differential (Cancer Center Only)    Standing Status:   Future    Standing  Expiration Date:   05/23/2023   CMP (Cancer Center only)    Standing Status:   Future    Standing Expiration Date:   05/23/2023   Follow-up 6 months  All questions were answered. The patient knows to call the clinic with any problems, questions or concerns.  [name], MD, PhD [facility] Hematology Oncology 05/23/2022    HISTORY OF PRESENTING ILLNESS:  Stacy [name] 77 y.o. female presents to establish care for Stage I left breast invasive carcinoma I have reviewed her chart and materials related to her cancer extensively  Summary of oncologic history is as follows: Oncology History  Invasive carcinoma of breast (HCC)  11/21/2021 Mammogram   Bilateral screening mammogram showed In the left breast, a possible mass warrants further evaluation. In the right breast, no findings suspicious for malignancy.   12/14/2021 Mammogram   Left unilateral diagnostic mammogram/ ultrasound showed Suspicious 1.2 cm mass over the 10 o'clock position of the left breast 4 cm from the nipple. No pathologically enlarged Left axilla lymph node    01/12/2022 Initial Diagnosis   Invasive carcinoma of breast   -Left breast lesion ultrasound-guided biopsy showed invasive mammary carcinoma with mucinous features, grade 2, DCIS negative, LVI negative ER 90%, PR 20%, HER2 IHC 2+ equivocal, FISH negative.  Menarche at age of [age] First live birth at age of 4 OCP use: 5 years Menopausal status: Postmenopausal,  LMP in 21s. 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 No pathogenic variants identified on the [facility] Multi-Cancer+RNA panel. The report date is 02/04/2022.  The Multi-Cancer + RNA Panel offered by [facility] includes sequencing and/or deletion/duplication analysis of the following 70 genes:  AIP*, ALK, APC*, ATM*, AXIN2*, BAP1*, BARD1*, BLM*, BMPR1A*, BRCA1*, BRCA2*, BRIP1*, CDC73*, CDH1*, CDK4, CDKN1B*, CDKN2A, CHEK2*, CTNNA1*, DICER1*, EPCAM, EGFR, FH*, FLCN*, GREM1, HOXB13, KIT, LZTR1, MAX*, MBD4, MEN1*, MET, MITF, MLH1*, MSH2*, MSH3*, MSH6*, MUTYH*, NF1*, NF2*, NTHL1*, PALB2*, PDGFRA, PMS2*, POLD1*, POLE*, POT1*, PRKAR1A*, PTCH1*, PTEN*, RAD51C*, RAD51D*, RB1*, RET, SDHA*, SDHAF2*, SDHB*, SDHC*, SDHD*, SMAD4*, SMARCA4*, SMARCB1*, SMARCE1*, STK11*, SUFU*, TMEM127*, TP53*, TSC1*, TSC2*, VHL*. RNA analysis is performed for * genes.   01/31/2022 Surgery   S/p left simple mastectomy and SLNB  Invasive mammary carcinoma with focal mucinous features. Grade 2, LVI-, All margins negative for invasive carcinoma.  Four sentinel  lymph nodes negative for malignancy  pT1c pN0    01/31/2022 Cancer Staging   Staging form: Breast, AJCC 8th Edition - Pathologic stage from 01/31/2022: Stage IA (pT1c, pN0, cM0, G2, ER+, PR+, HER2-, Oncotype DX score: 17) - Signed by [name], MD on 02/21/2022 Stage prefix: Initial diagnosis Multigene prognostic tests performed: Oncotype DX Recurrence score range: Greater than or equal to 11 Histologic grading system: 3 grade system       INTERVAL HISTORY Stacy [name] is a 77 y.o. female who has above history reviewed by me today presents for follow up visit for Stage IA left breast invasive carcinoma, s/p left simple mastectomy and SLNB She takes Arimidex 1mg  daily Pain in the left knee and ankle, pre-existing prior to AI   + hot flash. + hand joint pain.     MEDICAL HISTORY:  Past Medical History:  Diagnosis Date   Allergy    Arthritis    Cancer (HCC)    GERD  (gastroesophageal reflux disease)    Hyperlipidemia    Hypertension    Neoplasm of uncertain behavior of breast 2007   Pre-diabetes    Vertigo     SURGICAL HISTORY: Past Surgical History:  Procedure Laterality Date   BREAST BIOPSY Right 2015   3 stereo biopsies. 2 benign. one was radial scar, no excision done for radial scar   BREAST BIOPSY Left 07/16/2017   BIPHASIC STROMAL AND EPITHELIAL LESION./ Dr Lemar Livings   BREAST BIOPSY Left 08/23/2020   [place] bx of mass 5:00 2cmfn, coil marker, FIBROEPITHELIAL LESION WITH INTRACANALICULAR GROWTH PATTERN AND VARIABLE STROMAL HYPERCELLULARITY   BREAST BIOPSY Left 01/02/2022   [place] bx path pending 10:00 ribbon clip   BREAST BIOPSY Left 01/02/2022   [place] LT BREAST BX W LOC DEV 1ST LESION IMG BX SPEC US GUIDE 01/02/2022 [facility]   BREAST EXCISIONAL BIOPSY Left 1968   x 3 per pt   BREAST EXCISIONAL BIOPSY Right 2007   benign   BUNIONECTOMY Left    COLONOSCOPY  2011   DR,ISHAKIS   EXCISION OF BREAST LESION Left 10/07/2020   Procedure: EXCISION OF  BREAST LESION;  Surgeon: Earline Mayotte, MD;  Location: [facility];  Service: General;  Laterality: Left; FIBROEPITHELIAL LESION WITH NTRACANALICULAR GROWTH PATTERN AND VARIABLE STROMAL   HAMMER TOE SURGERY Left    2nd toe   HEEL SPUR SURGERY Left 12/15/2014   Dr. Loreta Ave at [facility]   HEEL SPUR SURGERY Right    MASTECTOMY W/ SENTINEL NODE BIOPSY Left 01/31/2022   Procedure: MASTECTOMY WITH SENTINEL LYMPH NODE BIOPSY;  Surgeon: Carolan Shiver, MD;  Location: [facility];  Service: General;  Laterality: Left;   TOTAL KNEE ARTHROPLASTY Left 06/08/2021   Procedure: TOTAL KNEE ARTHROPLASTY;  Surgeon: Kennedy Bucker, MD;  Location: [facility];  Service: Orthopedics;  Laterality: Left;    SOCIAL HISTORY: Social History   Socioeconomic History   Marital status: Married    Spouse name: Richard   Number of children: 1   Years of education: Not on file   Highest education level: 12th grade   Occupational History   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  Tobacco Use   Smoking status: Former    Packs/day: 0.25    Years: 2.00    Additional pack years: 0.00    Total pack years: 0.50    Types: Cigarettes    Quit date: 1974    Years since quitting: 50.3   Smokeless tobacco: Never   Tobacco comments:    smoking cessation materials not required  Vaping Use   Vaping Use: Never used  Substance and Sexual Activity   Alcohol use: No    Alcohol/week: 0.0 standard drinks of alcohol   Drug use: No   Sexual activity: Not Currently    Comment: Husband has prostate issues  Other Topics Concern   Not on file  Social History Narrative   Not on file   Social Determinants of Health   Financial Resource Strain: Low Risk  (02/15/2021)   Overall Financial Resource Strain (CARDIA)    Difficulty of Paying Living Expenses: Not hard at all  Food Insecurity: No Food Insecurity (12/22/2020)   Hunger Vital Sign    Worried About Running Out of Food in the Last Year: Never true    Ran Out of Food in the Last Year: Never true  Transportation Needs: No Transportation Needs (12/22/2020)   PRAPARE - Administrator, Civil Service (Medical): No    Lack of Transportation (Non-Medical): No  Physical Activity: Inactive (12/22/2020)   Exercise Vital Sign    Days of Exercise per Week: 0 days    Minutes of Exercise per Session: 0 min  Stress: No Stress Concern Present (12/22/2020)   [employer] of Occupational Health - Occupational Stress Questionnaire    Feeling of Stress : Not at all  Social Connections: Socially Integrated (12/22/2020)   Social Connection and Isolation Panel [NHANES]    Frequency of Communication with Friends and Family: More than three times a week    Frequency of Social Gatherings with Friends and Family: Once a week    Attends Religious Services: More than 4 times per year    Active Member of [employer] or Organizations: Yes    Attends Banker  Meetings: 1 to 4 times per year    Marital Status: Married  Catering manager Violence: Not At Risk (12/22/2020)   Humiliation, Afraid, Rape, and Kick questionnaire    Fear of Current or Ex-Partner: No    Emotionally Abused: No    Physically Abused: No  Sexually Abused: No    FAMILY HISTORY: Family History  Problem Relation Age of Onset   Stroke Mother    Arthritis Mother    Breast cancer Sister 46   Breast cancer Sister    Cancer Brother 97       kidney   Kidney cancer Brother    Cancer Maternal Grandmother        Gallbladder   Stroke Paternal Grandmother     ALLERGIES:  is allergic to altace [ramipril].  MEDICATIONS:  Current Outpatient Medications  Medication Sig Dispense Refill   acetaminophen (TYLENOL) 500 MG tablet Take 500-1,000 mg by mouth every 6 (six) hours as needed (pain.).     anastrozole (ARIMIDEX) 1 MG tablet Take 1 tablet (1 mg total) by mouth daily. 30 tablet 3   aspirin 81 MG EC tablet Take 81 mg by mouth in the morning.     Cholecalciferol (VITAMIN D-3 PO) Take 2,000 Units by mouth in the morning.     CINNAMON PO Take 1,000 mg by mouth in the morning.     FISH OIL-BORAGE-FLAX-SAFFLOWER PO Take 1 capsule by mouth in the morning.     fluticasone (FLONASE) 50 MCG/ACT nasal spray Place 2 sprays into both nostrils daily. (Patient taking differently: Place 2 sprays into both nostrils as needed.) 48 g 1   HYDROcodone-acetaminophen (NORCO/VICODIN) 5-325 MG tablet Take 1-2 tablets by mouth every 4 (four) hours as needed for moderate pain. 30 tablet 0   LIDOCAINE-MENTHOL EX Apply 1 application  topically 3 (three) times daily as needed (osteoarthritis pain.). [employer] Best Maximum Strength Topical Pain Relief Cream (4 % Lidocaine/Menthol)     loratadine (CLARITIN) 10 MG tablet Take 1 tablet (10 mg total) by mouth daily. (Patient taking differently: Take 10 mg by mouth daily as needed for allergies.) 90 tablet 1   meloxicam (MOBIC) 7.5 MG tablet Take 7.5 mg by mouth as  needed.     Multiple Vitamin (MULTIVITAMIN) capsule Take 1 capsule by mouth daily.     rosuvastatin (CRESTOR) 5 MG tablet Take 1 tablet (5 mg total) by mouth every Monday, Wednesday, and Friday. 36 tablet 3   triamterene-hydrochlorothiazide (MAXZIDE-25) 37.5-25 MG tablet Take 0.5-1 tablets by mouth daily. (Patient taking differently: Take 0.5 tablets by mouth daily.) 90 tablet 1   No current facility-administered medications for this visit.    Review of Systems  Constitutional:  Negative for appetite change, chills, fatigue and fever.  HENT:   Negative for hearing loss and voice change.   Eyes:  Negative for eye problems.  Respiratory:  Negative for chest tightness and cough.   Cardiovascular:  Negative for chest pain.  Gastrointestinal:  Negative for abdominal distention, abdominal pain and blood in stool.  Endocrine: Negative for hot flashes.  Genitourinary:  Negative for difficulty urinating and frequency.   Musculoskeletal:  Negative for arthralgias.  Skin:  Negative for itching and rash.  Neurological:  Negative for extremity weakness.  Hematological:  Negative for adenopathy.  Psychiatric/Behavioral:  Negative for confusion.      PHYSICAL EXAMINATION: ECOG PERFORMANCE STATUS: 0 - Asymptomatic  There were no vitals filed for this visit.  There were no vitals filed for this visit.   Physical Exam Constitutional:      General: She is not in acute distress.    Appearance: She is not diaphoretic.  HENT:     Head: Normocephalic.     Nose: Nose normal.     Mouth/Throat:     Pharynx: No oropharyngeal exudate.  Eyes:     General: No scleral icterus.    Pupils: Pupils are equal, round, and reactive to light.  Cardiovascular:     Rate and Rhythm: Normal rate.  Pulmonary:     Effort: Pulmonary effort is normal. No respiratory distress.  Abdominal:     General: There is no distension.     Palpations: Abdomen is soft.  Musculoskeletal:        General: Normal range of  motion.     Cervical back: Normal range of motion.  Skin:    General: Skin is warm and dry.     Findings: No erythema.  Neurological:     Mental Status: She is alert and oriented to person, place, and time. Mental status is at baseline.     Cranial Nerves: No cranial nerve deficit.     Motor: No abnormal muscle tone.  Psychiatric:        Mood and Affect: Affect normal.     LABORATORY DATA:  I have reviewed the data as listed    Latest Ref Rng & Units 05/23/2022    9:56 AM 02/01/2022    4:27 AM 01/16/2022   11:39 AM  CBC  WBC 4.0 - 10.5 K/uL 3.7  10.0  5.9   Hemoglobin 12.0 - 15.0 g/dL 96.0  45.4  09.8   Hematocrit 36.0 - 46.0 % 37.9  35.1  36.6   Platelets 150 - 400 K/uL 222  231  221       Latest Ref Rng & Units 05/23/2022    9:56 AM 02/01/2022    4:27 AM 01/16/2022   11:39 AM  CMP  Glucose 70 - 99 mg/dL 119  147  829   BUN 8 - 23 mg/dL 13  19  17    Creatinine 0.44 - 1.00 mg/dL 5.62  1.30  8.65   Sodium 135 - 145 mmol/L 139  137  139   Potassium 3.5 - 5.1 mmol/L 3.7  3.5  3.6   Chloride 98 - 111 mmol/L 103  104  101   CO2 22 - 32 mmol/L 28  27  28    Calcium 8.9 - 10.3 mg/dL 9.3  8.6  9.1   Total Protein 6.5 - 8.1 g/dL 7.7   7.8   Total Bilirubin 0.3 - 1.2 mg/dL 0.8   0.7   Alkaline Phos 38 - 126 U/L 53   62   AST 15 - 41 U/L 22   19   ALT 0 - 44 U/L 19   20      RADIOGRAPHIC STUDIES: I have personally reviewed the radiological images as listed and agreed with the findings in the report. No results found.

## 2022-05-23 NOTE — Assessment & Plan Note (Addendum)
Left breast invasive carcinoma, ER+. PR+. HER2- s/p mastectomy,  pT1c pN0, Oncotype DX 17 Pathology was reviewed and discussed with patient.  No need for adjuvant chemotherapy  07/2020 Normal bone density  Labs are reviewed and discussed with patient. She tolerates with manageable side effects.  Continue Arimidex 1mg  daily.  Annual right screening mammogram - Nov 2024  Daughter was called and updated.

## 2022-05-25 NOTE — Progress Notes (Unsigned)
Name: Stacy [name]   MRN: [id number]    DOB: Jun 29, 1945   Date:05/28/2022       Progress Note  Subjective  Chief Complaint  Follow Up  HPI  Invasive carcinoma of the left breast: mastectomy is scheduled for 01/31/2022 with Dr. Harl Bowie, she is under the care of Dr. Cathie Hoops. She is currently taking Arimidex and tolerating well.   DMII: doing well, on diet only, can't take ACE caused angioedema, last urine micro negative   Denies polyphagia, polydipsia or polyuria.  She is following a diabetic diet. She has been compliant with Crestor 5 mg M, W and Fridays , last LDL was at goal at 41  A1C today is 5.7 %  Eye exam is up to date    HTN: taking triamterene hctz, BP today is at goal, she is taking a full pill of triamterene hctz now, Denies chestpain, dizziness, palpitation, orthopnea  or SOB, she has chronic lower extremity swelling , she states better with compression stocking hoses , she saw vascular surgeon and was advised to continue compression stocking hoses . Stable   Vitamin D deficiency: continue supplementation  AR: she only taking medication prn, she has been doing well   OA, history of left knee total replacement: currently using topical medications for joint aches as recommended by Dr. Cathie Hoops  Patient Active Problem List   Diagnosis Date Noted   Aromatase inhibitor use 05/23/2022   Arthralgia 05/23/2022   Genetic testing 02/05/2022   Breast cancer (HCC) 01/31/2022   Invasive carcinoma of breast (HCC) 01/16/2022   Goals of care, counseling/discussion 01/16/2022   S/P TKR (total knee replacement) using cement, left 06/08/2021   Hyperlipidemia associated with type 2 diabetes mellitus (HCC) 01/14/2020   Onychomycosis of multiple toenails with type 2 diabetes mellitus (HCC) 01/23/2019   Pain due to onychomycosis of toenails of both feet 10/06/2018   Abnormal EKG 12/10/2016   Radial scar of breast 05/30/2015   History of foot surgery 12/15/2014   Hypertension, benign 10/07/2014    Seasonal allergies 10/07/2014   Diabetes mellitus type 2, diet-controlled (HCC) 10/07/2014   Vitamin D deficiency 10/07/2014   Varicose veins 10/07/2014   Family history of breast cancer 05/12/2012    Past Surgical History:  Procedure Laterality Date   BREAST BIOPSY Right 2015   3 stereo biopsies. 2 benign. one was radial scar, no excision done for radial scar   BREAST BIOPSY Left 07/16/2017   BIPHASIC STROMAL AND EPITHELIAL LESION./ Dr Lemar Livings   BREAST BIOPSY Left 08/23/2020   [place] bx of mass 5:00 2cmfn, coil marker, FIBROEPITHELIAL LESION WITH INTRACANALICULAR GROWTH PATTERN AND VARIABLE STROMAL HYPERCELLULARITY   BREAST BIOPSY Left 01/02/2022   [place] bx path pending 10:00 ribbon clip   BREAST BIOPSY Left 01/02/2022   [place] LT BREAST BX W LOC DEV 1ST LESION IMG BX SPEC US GUIDE 01/02/2022 [facility]   BREAST EXCISIONAL BIOPSY Left 1968   x 3 per pt   BREAST EXCISIONAL BIOPSY Right 2007   benign   BUNIONECTOMY Left    COLONOSCOPY  2011   DR,ISHAKIS   EXCISION OF BREAST LESION Left 10/07/2020   Procedure: EXCISION OF BREAST LESION;  Surgeon: Earline Mayotte, MD;  Location: [facility];  Service: General;  Laterality: Left; FIBROEPITHELIAL LESION WITH NTRACANALICULAR GROWTH PATTERN AND VARIABLE STROMAL   HAMMER TOE SURGERY Left    2nd toe   HEEL SPUR SURGERY Left 12/15/2014   Dr. Loreta Ave at [facility]   HEEL SPUR SURGERY Right  MASTECTOMY W/ SENTINEL NODE BIOPSY Left 01/31/2022   Procedure: MASTECTOMY WITH SENTINEL LYMPH NODE BIOPSY;  Surgeon: Carolan Shiver, MD;  Location: [facility];  Service: General;  Laterality: Left;   TOTAL KNEE ARTHROPLASTY Left 06/08/2021   Procedure: TOTAL KNEE ARTHROPLASTY;  Surgeon: Kennedy Bucker, MD;  Location: [facility];  Service: Orthopedics;  Laterality: Left;    Family History  Problem Relation Age of Onset   Stroke Mother    Arthritis Mother    Breast cancer Sister 65   Breast cancer Sister    Cancer Brother 6       kidney    Kidney cancer Brother    Cancer Maternal Grandmother        Gallbladder   Stroke Paternal Grandmother     Social History   Tobacco Use   Smoking status: Former    Packs/day: 0.25    Years: 2.00    Additional pack years: 0.00    Total pack years: 0.50    Types: Cigarettes    Quit date: 1974    Years since quitting: 50.3   Smokeless tobacco: Never   Tobacco comments:    smoking cessation materials not required  Substance Use Topics   Alcohol use: No    Alcohol/week: 0.0 standard drinks of alcohol     Current Outpatient Medications:    acetaminophen (TYLENOL) 500 MG tablet, Take 500-1,000 mg by mouth every 6 (six) hours as needed (pain.)., Disp: , Rfl:    anastrozole (ARIMIDEX) 1 MG tablet, Take 1 tablet (1 mg total) by mouth daily., Disp: 90 tablet, Rfl: 1   aspirin 81 MG EC tablet, Take 81 mg by mouth in the morning., Disp: , Rfl:    Cholecalciferol (VITAMIN D-3 PO), Take 2,000 Units by mouth in the morning., Disp: , Rfl:    CINNAMON PO, Take 1,000 mg by mouth in the morning., Disp: , Rfl:    FISH OIL-BORAGE-FLAX-SAFFLOWER PO, Take 1 capsule by mouth in the morning., Disp: , Rfl:    HYDROcodone-acetaminophen (NORCO/VICODIN) 5-325 MG tablet, Take 1-2 tablets by mouth every 4 (four) hours as needed for moderate pain., Disp: 30 tablet, Rfl: 0   LIDOCAINE-MENTHOL EX, Apply 1 application  topically 3 (three) times daily as needed (osteoarthritis pain.). [employer]'s Best Maximum Strength Topical Pain Relief Cream (4 % Lidocaine/Menthol), Disp: , Rfl:    meloxicam (MOBIC) 7.5 MG tablet, Take 7.5 mg by mouth as needed., Disp: , Rfl:    Multiple Vitamin (MULTIVITAMIN) capsule, Take 1 capsule by mouth daily., Disp: , Rfl:    rosuvastatin (CRESTOR) 5 MG tablet, Take 1 tablet (5 mg total) by mouth every Monday, Wednesday, and Friday., Disp: 36 tablet, Rfl: 3   triamterene-hydrochlorothiazide (MAXZIDE-25) 37.5-25 MG tablet, Take 0.5-1 tablets by mouth daily. (Patient taking differently: Take 0.5  tablets by mouth daily.), Disp: 90 tablet, Rfl: 1   fluticasone (FLONASE) 50 MCG/ACT nasal spray, Place 2 sprays into both nostrils daily. (Patient not taking: Reported on 05/23/2022), Disp: 48 g, Rfl: 1   loratadine (CLARITIN) 10 MG tablet, Take 1 tablet (10 mg total) by mouth daily. (Patient not taking: Reported on 05/23/2022), Disp: 90 tablet, Rfl: 1  Allergies  Allergen Reactions   Altace [Ramipril] Swelling and Other (See Comments)    lips    I personally reviewed active problem list, medication list, allergies, family history, social history, health maintenance with the patient/caregiver today.   ROS  Ten systems reviewed and is negative except as mentioned in HPI   Objective  Vitals:   05/28/22 0951  BP: 136/80  Pulse: 89  Resp: 16  SpO2: 99%  Weight: 195 lb (88.5 kg)  Height: 5\' 7"  (1.702 m)    Body mass index is 30.54 kg/m.  Physical Exam  Constitutional: Patient appears well-developed and well-nourished. Obese  No distress.  HEENT: head atraumatic, normocephalic, pupils equal and reactive to light, neck supple Cardiovascular: Normal rate, regular rhythm and normal heart sounds.  No murmur heard. Trace  BLE edema. Pulmonary/Chest: Effort normal and breath sounds normal. No respiratory distress. Abdominal: Soft.  There is no tenderness. Psychiatric: Patient has a normal mood and affect. behavior is normal. Judgment and thought content normal.    PHQ2/9:    05/28/2022    9:52 AM 01/30/2022   11:01 AM 01/25/2022    9:12 AM 09/14/2021   10:49 AM 01/02/2021   10:52 AM  Depression screen PHQ 2/9  Decreased Interest 0 0 0 0 0  Down, Depressed, Hopeless 0 0 0 0 0  PHQ - 2 Score 0 0 0 0 0  Altered sleeping 0 0 0 0 0  Tired, decreased energy 0 0 0 0 0  Change in appetite 0 0 0 0 0  Feeling bad or failure about yourself  0 0 0 0 0  Trouble concentrating 0 0 0 0 0  Moving slowly or fidgety/restless 0 0 0 0 0  Suicidal thoughts 0 0 0 0 0  PHQ-9 Score 0 0 0 0 0   Difficult doing work/chores  Not difficult at all  Not difficult at all     phq 9 is negative   Fall Risk:    05/28/2022    9:52 AM 01/30/2022   11:01 AM 01/25/2022    9:12 AM 09/14/2021   10:49 AM 01/02/2021   10:51 AM  Fall Risk   Falls in the past year? 0 0 0 0 0  Number falls in past yr: 0 0  0 0  Injury with Fall? 0 0  0 0  Risk for fall due to : Impaired balance/gait;Impaired mobility No Fall Risks No Fall Risks No Fall Risks No Fall Risks  Follow up Falls prevention discussed Falls prevention discussed;Education provided;Falls evaluation completed Falls prevention discussed;Education provided;Falls evaluation completed Falls prevention discussed;Education provided Falls prevention discussed      Functional Status Survey: Is the patient deaf or have difficulty hearing?: No Does the patient have difficulty seeing, even when wearing glasses/contacts?: Yes Does the patient have difficulty concentrating, remembering, or making decisions?: No Does the patient have difficulty walking or climbing stairs?: Yes Does the patient have difficulty dressing or bathing?: No Does the patient have difficulty doing errands alone such as visiting a doctor's office or shopping?: No    Assessment & Plan  1. Hyperlipidemia associated with type 2 diabetes mellitus (HCC)  - POCT HgB A1C - rosuvastatin (CRESTOR) 5 MG tablet; Take 1 tablet (5 mg total) by mouth every Monday, Wednesday, and Friday.  Dispense: 36 tablet; Refill: 3  2. Hypertension associated with type 2 diabetes mellitus (HCC)  - triamterene-hydrochlorothiazide (MAXZIDE-25) 37.5-25 MG tablet; Take 1 tablet by mouth daily.  Dispense: 90 tablet; Refill: 1  3. Invasive carcinoma of breast (HCC)  Under the care of Dr. Cathie Hoops  4. Primary osteoarthritis of left knee   5. S/P TKR (total knee replacement) using cement, left   6. Vitamin D deficiency  Continue supplementation

## 2022-05-28 ENCOUNTER — Encounter: Payer: Self-pay | Admitting: Family Medicine

## 2022-05-28 ENCOUNTER — Ambulatory Visit (INDEPENDENT_AMBULATORY_CARE_PROVIDER_SITE_OTHER): Payer: Medicare PPO | Admitting: Family Medicine

## 2022-05-28 VITALS — BP 136/80 | HR 89 | Resp 16 | Ht 67.0 in | Wt 195.0 lb

## 2022-05-28 DIAGNOSIS — M1712 Unilateral primary osteoarthritis, left knee: Secondary | ICD-10-CM

## 2022-05-28 DIAGNOSIS — Z96652 Presence of left artificial knee joint: Secondary | ICD-10-CM | POA: Diagnosis not present

## 2022-05-28 DIAGNOSIS — E1159 Type 2 diabetes mellitus with other circulatory complications: Secondary | ICD-10-CM | POA: Diagnosis not present

## 2022-05-28 DIAGNOSIS — C50919 Malignant neoplasm of unspecified site of unspecified female breast: Secondary | ICD-10-CM | POA: Diagnosis not present

## 2022-05-28 DIAGNOSIS — E785 Hyperlipidemia, unspecified: Secondary | ICD-10-CM | POA: Diagnosis not present

## 2022-05-28 DIAGNOSIS — E1169 Type 2 diabetes mellitus with other specified complication: Secondary | ICD-10-CM | POA: Diagnosis not present

## 2022-05-28 DIAGNOSIS — I152 Hypertension secondary to endocrine disorders: Secondary | ICD-10-CM

## 2022-05-28 DIAGNOSIS — E559 Vitamin D deficiency, unspecified: Secondary | ICD-10-CM | POA: Diagnosis not present

## 2022-05-28 LAB — POCT GLYCOSYLATED HEMOGLOBIN (HGB A1C): Hemoglobin A1C: 5.7 % — AB (ref 4.0–5.6)

## 2022-05-28 MED ORDER — ROSUVASTATIN CALCIUM 5 MG PO TABS: 5.0000 mg | ORAL_TABLET | ORAL | 3 refills | Status: DC

## 2022-05-28 MED ORDER — TRIAMTERENE-HCTZ 37.5-25 MG PO TABS: 1.0000 | ORAL_TABLET | Freq: Every day | ORAL | 1 refills | Status: DC

## 2022-07-05 DIAGNOSIS — H25011 Cortical age-related cataract, right eye: Secondary | ICD-10-CM | POA: Diagnosis not present

## 2022-07-05 DIAGNOSIS — H2511 Age-related nuclear cataract, right eye: Secondary | ICD-10-CM | POA: Diagnosis not present

## 2022-07-06 DIAGNOSIS — H2512 Age-related nuclear cataract, left eye: Secondary | ICD-10-CM | POA: Diagnosis not present

## 2022-07-24 ENCOUNTER — Telehealth: Payer: Self-pay | Admitting: *Deleted

## 2022-07-24 NOTE — Telephone Encounter (Addendum)
Daughter called and is in need of a letter stating hat she is in school and needs a 3.0 GPA for her financial aid to continue and the fact that her mother was diagnosed in Jan and she was her care giver and dealing with all that she fell short by 3/10 of a point so she needs documentation that she was her care giver and and what was medically going on with her mother so that she can continue with her last semester f school to graduate. If you have further questions about this, please call her

## 2022-07-25 ENCOUNTER — Telehealth: Payer: Self-pay

## 2022-07-25 NOTE — Telephone Encounter (Signed)
Error

## 2022-07-25 NOTE — Telephone Encounter (Signed)
Letter completed. [employer] notified and will pick up from registration.

## 2022-07-26 ENCOUNTER — Ambulatory Visit (INDEPENDENT_AMBULATORY_CARE_PROVIDER_SITE_OTHER): Payer: Medicare PPO | Admitting: Podiatry

## 2022-07-26 ENCOUNTER — Encounter: Payer: Self-pay | Admitting: Podiatry

## 2022-07-26 VITALS — BP 171/84 | HR 71

## 2022-07-26 DIAGNOSIS — B351 Tinea unguium: Secondary | ICD-10-CM

## 2022-07-26 DIAGNOSIS — E119 Type 2 diabetes mellitus without complications: Secondary | ICD-10-CM

## 2022-07-26 DIAGNOSIS — M79675 Pain in left toe(s): Secondary | ICD-10-CM

## 2022-07-26 DIAGNOSIS — M79674 Pain in right toe(s): Secondary | ICD-10-CM | POA: Diagnosis not present

## 2022-07-26 NOTE — Progress Notes (Signed)
  Subjective:  Patient ID: Stacy [name], female    DOB: 23-Oct-1945,  MRN: [id number]  Stacy [name] presents to clinic today for: preventative diabetic foot care and painful thick toenails that are difficult to trim. Pain interferes with ambulation. Aggravating factors include wearing enclosed shoe gear. Pain is relieved with periodic professional debridement. Patient relates her left great toe is sore and points to medial border. Denies any redness, drainage or swelling, but it is tender when wearing enclosed shoe gear. States she has been spraying topical pain reliever on it and it worked. Chief Complaint  Patient presents with   Nail Problem    "Cut my toenails."    PCP is Alba Cory, MD.  Allergies  Allergen Reactions   Altace [Ramipril] Swelling and Other (See Comments)    lips    Review of Systems: Negative except as noted in the HPI.  Objective: No changes noted in today's physical examination. Vitals:   07/26/22 0928  BP: (!) 171/84  Pulse: 71    Stacy [name] is a pleasant 77 y.o. female in NAD. AAO x 3.  Vascular Examination: Capillary refill time <3 seconds b/l LE. Palpable pedal pulses b/l LE. Digital hair present b/l. No pedal edema b/l. Skin temperature gradient WNL b/l. No varicosities b/l. Marland Kitchen  Dermatological Examination: Pedal skin with normal turgor, texture and tone b/l. No open wounds. No interdigital macerations b/l. Toenails 1-5 b/l thickened, discolored, dystrophic with subungual debris. There is pain on palpation to dorsal aspect of nailplates.   Incurvated nailplate medial border left hallux.  Nail border hypertrophy absent. There is tenderness to palpation. Sign(s) of infection: no clinical signs of infection noted on examination today..   Neurological Examination: Protective sensation intact with 10 gram monofilament b/l LE. Vibratory sensation intact b/l LE.   Musculoskeletal Examination: Normal muscle strength 5/5 to all lower  extremity muscle groups bilaterally. No pain, crepitus or joint limitation noted with ROM b/l LE. No gross bony pedal deformities b/l. Patient ambulates independently without assistive aids.      Latest Ref Rng & Units 05/28/2022    9:54 AM 01/25/2022    9:14 AM 09/14/2021   11:10 AM  Hemoglobin A1C  Hemoglobin-A1c 4.0 - 5.6 % 5.7  5.8  5.9    Assessment/Plan: 1. Pain due to onychomycosis of toenails of both feet   2. Diabetes mellitus type 2, diet-controlled (HCC)     -Patient was evaluated and treated. All patient's and/or POA's questions/concerns answered on today's visit. -Patient to continue soft, supportive shoe gear daily. -Toenails 2-5 bilaterally and R hallux debrided in length and girth without iatrogenic bleeding with sterile nail nipper and dremel.  -No invasive procedure(s) performed. Offending nail border debrided and curretaged L hallux utilizing sterile nail nipper and currette. Border cleansed with alcohol and triple antibiotic ointment. No further treatment required by patient/caregiver. Call office if there are any concerns. -Patient/POA to call should there be question/concern in the interim.   Return in about 10 weeks (around 10/04/2022).  Freddie Breech, DPM

## 2022-08-09 DIAGNOSIS — H2512 Age-related nuclear cataract, left eye: Secondary | ICD-10-CM | POA: Diagnosis not present

## 2022-08-09 DIAGNOSIS — H25012 Cortical age-related cataract, left eye: Secondary | ICD-10-CM | POA: Diagnosis not present

## 2022-10-08 ENCOUNTER — Ambulatory Visit (INDEPENDENT_AMBULATORY_CARE_PROVIDER_SITE_OTHER): Payer: Medicare PPO | Admitting: Podiatry

## 2022-10-08 ENCOUNTER — Encounter: Payer: Self-pay | Admitting: Podiatry

## 2022-10-08 VITALS — BP 181/85 | HR 69

## 2022-10-08 DIAGNOSIS — E119 Type 2 diabetes mellitus without complications: Secondary | ICD-10-CM

## 2022-10-08 DIAGNOSIS — M79675 Pain in left toe(s): Secondary | ICD-10-CM | POA: Diagnosis not present

## 2022-10-08 DIAGNOSIS — B351 Tinea unguium: Secondary | ICD-10-CM

## 2022-10-08 DIAGNOSIS — S90229A Contusion of unspecified lesser toe(s) with damage to nail, initial encounter: Secondary | ICD-10-CM | POA: Diagnosis not present

## 2022-10-08 DIAGNOSIS — M79674 Pain in right toe(s): Secondary | ICD-10-CM | POA: Diagnosis not present

## 2022-10-09 ENCOUNTER — Encounter: Payer: Self-pay | Admitting: Podiatry

## 2022-10-09 NOTE — Progress Notes (Signed)
Subjective:  Patient ID: Stacy [name], female    DOB: [birth year],  MRN: [id number]  77 y.o. female presents to clinic with  preventative diabetic foot care and painful elongated mycotic toenails 1-5 bilaterally which are tender when wearing enclosed shoe gear. Pain is relieved with periodic professional debridement.  Chief Complaint  Patient presents with   Diabetes    "Get my toenails trimmed.  The big toes are sore around the corner.  I don't know why that toenail is blue."   PCP is Alba Cory, MD.  Allergies  Allergen Reactions   Altace [Ramipril] Swelling and Other (See Comments)    lips    Review of Systems: Negative except as noted in the HPI.   Objective:  Stacy [name] is a pleasant 77 y.o. female in NAD.Marland Kitchen  Vascular Examination: Vascular status intact b/l with palpable pedal pulses. CFT immediate b/l. No edema. No pain with calf compression b/l. Skin temperature gradient WNL b/l.   Neurological Examination: Sensation grossly intact b/l with 10 gram monofilament. Vibratory sensation intact b/l.   Dermatological Examination: Pedal skin with normal turgor, texture and tone b/l. Toenails 1-5 b/l thick, discolored, elongated with subungual debris and pain on dorsal palpation. No hyperkeratotic lesions noted b/l.   Incurvated nailplate both borders of left hallux and both borders of right hallux.  Nail border hypertrophy absent. There is tenderness to palpation. Sign(s) of infection: no clinical signs of infection noted on examination today.. There is evidence of subacute subungual hematoma of the L 2nd toe. Nailplate remains adhered. There is no  tenderness to palpation.  Musculoskeletal Examination: Muscle strength 5/5 to b/l LE. No pain, crepitus or joint limitation noted with ROM bilateral LE. No gross bony deformities bilaterally.  Radiographs: None  Last A1c:      Latest Ref Rng & Units 05/28/2022    9:54 AM 01/25/2022    9:14 AM  Hemoglobin A1C   Hemoglobin-A1c 4.0 - 5.6 % 5.7  5.8      Assessment:   1. Pain due to onychomycosis of toenails of both feet   2. Subungual hematoma of lesser toe    Plan:  -Consent given for treatment as described below: -Examined patient. -Monitor subungual hematoma left 2nd digit. -Patient to continue soft, supportive shoe gear daily. -Toenails were debrided in length and girth 2-5 bilaterally with sterile nail nippers and dremel without iatrogenic bleeding.  -No invasive procedure(s) performed. Offending nail border debrided and curretaged bilateral great toes utilizing sterile nail nipper and currette. Border cleansed with alcohol and TAO applied. No further treatment required by patient/caregiver. Call office if there are any concerns. -Patient/POA to call should there be question/concern in the interim.  Return in about 10 weeks (around 12/17/2022).  Freddie Breech, DPM

## 2022-11-01 ENCOUNTER — Other Ambulatory Visit: Payer: Self-pay

## 2022-11-01 MED ORDER — ANASTROZOLE 1 MG PO TABS: 1.0000 mg | ORAL_TABLET | Freq: Every day | ORAL | 1 refills | Status: DC

## 2022-11-26 ENCOUNTER — Inpatient Hospital Stay: Payer: Medicare PPO | Attending: Oncology

## 2022-11-26 ENCOUNTER — Ambulatory Visit
Admission: RE | Admit: 2022-11-26 | Discharge: 2022-11-26 | Disposition: A | Discharge status: Home / Self Care | Payer: Medicare PPO | Source: Ambulatory Visit | Attending: Oncology | Admitting: Oncology

## 2022-11-26 DIAGNOSIS — Z853 Personal history of malignant neoplasm of breast: Secondary | ICD-10-CM | POA: Diagnosis not present

## 2022-11-26 DIAGNOSIS — Z9012 Acquired absence of left breast and nipple: Secondary | ICD-10-CM | POA: Diagnosis not present

## 2022-11-26 DIAGNOSIS — C50212 Malignant neoplasm of upper-inner quadrant of left female breast: Secondary | ICD-10-CM | POA: Insufficient documentation

## 2022-11-26 DIAGNOSIS — M25562 Pain in left knee: Secondary | ICD-10-CM | POA: Diagnosis not present

## 2022-11-26 DIAGNOSIS — I10 Essential (primary) hypertension: Secondary | ICD-10-CM | POA: Insufficient documentation

## 2022-11-26 DIAGNOSIS — Z170 Estrogen receptor positive status [ER+]: Secondary | ICD-10-CM | POA: Insufficient documentation

## 2022-11-26 DIAGNOSIS — M1990 Unspecified osteoarthritis, unspecified site: Secondary | ICD-10-CM | POA: Diagnosis not present

## 2022-11-26 DIAGNOSIS — Z87891 Personal history of nicotine dependence: Secondary | ICD-10-CM | POA: Diagnosis not present

## 2022-11-26 DIAGNOSIS — Z1722 Progesterone receptor negative status: Secondary | ICD-10-CM | POA: Diagnosis not present

## 2022-11-26 DIAGNOSIS — Z803 Family history of malignant neoplasm of breast: Secondary | ICD-10-CM | POA: Diagnosis not present

## 2022-11-26 DIAGNOSIS — Z79811 Long term (current) use of aromatase inhibitors: Secondary | ICD-10-CM | POA: Insufficient documentation

## 2022-11-26 DIAGNOSIS — C50919 Malignant neoplasm of unspecified site of unspecified female breast: Secondary | ICD-10-CM | POA: Diagnosis not present

## 2022-11-26 DIAGNOSIS — E785 Hyperlipidemia, unspecified: Secondary | ICD-10-CM | POA: Insufficient documentation

## 2022-11-26 DIAGNOSIS — Z809 Family history of malignant neoplasm, unspecified: Secondary | ICD-10-CM | POA: Diagnosis not present

## 2022-11-26 DIAGNOSIS — Z8261 Family history of arthritis: Secondary | ICD-10-CM | POA: Diagnosis not present

## 2022-11-26 DIAGNOSIS — Z79899 Other long term (current) drug therapy: Secondary | ICD-10-CM | POA: Insufficient documentation

## 2022-11-26 DIAGNOSIS — Z1231 Encounter for screening mammogram for malignant neoplasm of breast: Secondary | ICD-10-CM | POA: Insufficient documentation

## 2022-11-26 DIAGNOSIS — Z780 Asymptomatic menopausal state: Secondary | ICD-10-CM | POA: Diagnosis not present

## 2022-11-26 DIAGNOSIS — M25579 Pain in unspecified ankle and joints of unspecified foot: Secondary | ICD-10-CM | POA: Diagnosis not present

## 2022-11-26 DIAGNOSIS — Z8379 Family history of other diseases of the digestive system: Secondary | ICD-10-CM | POA: Diagnosis not present

## 2022-11-26 DIAGNOSIS — M25549 Pain in joints of unspecified hand: Secondary | ICD-10-CM | POA: Insufficient documentation

## 2022-11-26 DIAGNOSIS — R232 Flushing: Secondary | ICD-10-CM | POA: Insufficient documentation

## 2022-11-26 DIAGNOSIS — Z83438 Family history of other disorder of lipoprotein metabolism and other lipidemia: Secondary | ICD-10-CM | POA: Diagnosis not present

## 2022-11-26 DIAGNOSIS — Z823 Family history of stroke: Secondary | ICD-10-CM | POA: Insufficient documentation

## 2022-11-26 DIAGNOSIS — E119 Type 2 diabetes mellitus without complications: Secondary | ICD-10-CM | POA: Diagnosis not present

## 2022-11-26 LAB — CMP (CANCER CENTER ONLY)
ALT: 21 U/L (ref 0–44)
AST: 23 U/L (ref 15–41)
Albumin: 4.3 g/dL (ref 3.5–5.0)
Alkaline Phosphatase: 59 U/L (ref 38–126)
Anion gap: 11 (ref 5–15)
BUN: 15 mg/dL (ref 8–23)
CO2: 26 mmol/L (ref 22–32)
Calcium: 9.4 mg/dL (ref 8.9–10.3)
Chloride: 103 mmol/L (ref 98–111)
Creatinine: 0.78 mg/dL (ref 0.44–1.00)
GFR, Estimated: >60 mL/min (ref >60)
Glucose, Bld: 100 mg/dL — ABNORMAL HIGH (ref 70–99)
Potassium: 3.4 mmol/L — ABNORMAL LOW (ref 3.5–5.1)
Sodium: 140 mmol/L (ref 135–145)
Total Bilirubin: 0.7 mg/dL (ref <1.2)
Total Protein: 7.9 g/dL (ref 6.5–8.1)

## 2022-11-26 LAB — CBC WITH DIFFERENTIAL (CANCER CENTER ONLY)
Abs Immature Granulocytes: 0.01 10*3/uL (ref 0.00–0.07)
Basophils Absolute: 0 10*3/uL (ref 0.0–0.1)
Basophils Relative: 1 %
Eosinophils Absolute: 0.2 10*3/uL (ref 0.0–0.5)
Eosinophils Relative: 4 %
HCT: 40.3 % (ref 36.0–46.0)
Hemoglobin: 12.9 g/dL (ref 12.0–15.0)
Immature Granulocytes: 0 %
Lymphocytes Relative: 34 %
Lymphs Abs: 1.4 10*3/uL (ref 0.7–4.0)
MCH: 28.1 pg (ref 26.0–34.0)
MCHC: 32 g/dL (ref 30.0–36.0)
MCV: 87.8 fL (ref 80.0–100.0)
Monocytes Absolute: 0.3 10*3/uL (ref 0.1–1.0)
Monocytes Relative: 6 %
Neutro Abs: 2.2 10*3/uL (ref 1.7–7.7)
Neutrophils Relative %: 55 %
Platelet Count: 237 10*3/uL (ref 150–400)
RBC: 4.59 MIL/uL (ref 3.87–5.11)
RDW: 14.1 % (ref 11.5–15.5)
WBC Count: 4.1 10*3/uL (ref 4.0–10.5)
nRBC: 0 % (ref 0.0–0.2)

## 2022-11-28 ENCOUNTER — Ambulatory Visit: Payer: Medicare PPO | Admitting: Family Medicine

## 2022-11-28 NOTE — Progress Notes (Unsigned)
Name: Stacy [name]   MRN: [id number]    DOB: 1945/07/22   Date:11/29/2022       Progress Note  Subjective  Chief Complaint  Follow Up  HPI  Invasive carcinoma of the left breast: mastectomy is scheduled for 01/31/2022 with Dr. Harl Bowie, she is under the care of Dr. Cathie Hoops. She is currently taking Arimidex , she has noticed increase in hot flashes and joint pain   DMII: doing well, on diet only, can't take ACE caused angioedema, last urine micro negative   Denies polyphagia, polydipsia or polyuria.  She is following a diabetic diet. She has been compliant with Crestor 5 mg M, W and Fridays , last LDL was at goal at 41  A1C today is 6.1  %  Eye exam is up to date    HTN: taking triamterene hctz, BP today was high twice in our office and also at recent hematologist visit.    Denies chestpain, dizziness, palpitation, orthopnea  but has intermittent mild SOB.  She has chronic lower extremity swelling , she states better with compression stocking hoses , she saw vascular surgeon and was advised to continue compression stocking hoses . We will add low dose beta blockers since allergic to ACE and norvasc will increase edema. Recheck bp in one week  Vitamin D deficiency: continue supplementation, we will recheck labs   OA, history of left knee total replacement: currently using topical medications for joint aches as recommended by Dr. Cathie Hoops but is not working   Hand pain and also left thumb pain and trigger finger both hands ring finger: going on for months, not better with topical medication. We will try low dose celebrex   Patient Active Problem List   Diagnosis Date Noted   Aromatase inhibitor use 05/23/2022   Arthralgia 05/23/2022   Genetic testing 02/05/2022   Breast cancer (HCC) 01/31/2022   Invasive carcinoma of breast (HCC) 01/16/2022   Goals of care, counseling/discussion 01/16/2022   S/P TKR (total knee replacement) using cement, left 06/08/2021   Hyperlipidemia associated with type 2  diabetes mellitus (HCC) 01/14/2020   Onychomycosis of multiple toenails with type 2 diabetes mellitus (HCC) 01/23/2019   Pain due to onychomycosis of toenails of both feet 10/06/2018   Abnormal EKG 12/10/2016   Radial scar of breast 05/30/2015   History of foot surgery 12/15/2014   Hypertension, benign 10/07/2014   Seasonal allergies 10/07/2014   Diabetes mellitus type 2, diet-controlled (HCC) 10/07/2014   Vitamin D deficiency 10/07/2014   Varicose veins 10/07/2014   Family history of breast cancer 05/12/2012    Past Surgical History:  Procedure Laterality Date   BREAST BIOPSY Right 2015   3 stereo biopsies. 2 benign. one was radial scar, no excision done for radial scar   BREAST BIOPSY Left 07/16/2017   BIPHASIC STROMAL AND EPITHELIAL LESION./ Dr Lemar Livings   BREAST BIOPSY Left 08/23/2020   [place] bx of mass 5:00 2cmfn, coil marker, FIBROEPITHELIAL LESION WITH INTRACANALICULAR GROWTH PATTERN AND VARIABLE STROMAL HYPERCELLULARITY   BREAST BIOPSY Left 01/02/2022   [place] bx path pending 10:00 ribbon clip   BREAST BIOPSY Left 01/02/2022   [place] LT BREAST BX W LOC DEV 1ST LESION IMG BX SPEC US GUIDE 01/02/2022 [facility]   BREAST EXCISIONAL BIOPSY Left 1968   x 3 per pt   BREAST EXCISIONAL BIOPSY Right 2007   benign   BUNIONECTOMY Left    COLONOSCOPY  2011   DR,ISHAKIS   EXCISION OF BREAST LESION Left 10/07/2020  Procedure: EXCISION OF BREAST LESION;  Surgeon: Earline Mayotte, MD;  Location: [facility];  Service: General;  Laterality: Left; FIBROEPITHELIAL LESION WITH NTRACANALICULAR GROWTH PATTERN AND VARIABLE STROMAL   HAMMER TOE SURGERY Left    2nd toe   HEEL SPUR SURGERY Left 12/15/2014   Dr. Loreta Ave at [facility]   HEEL SPUR SURGERY Right    MASTECTOMY W/ SENTINEL NODE BIOPSY Left 01/31/2022   Procedure: MASTECTOMY WITH SENTINEL LYMPH NODE BIOPSY;  Surgeon: Carolan Shiver, MD;  Location: [facility];  Service: General;  Laterality: Left;   TOTAL KNEE ARTHROPLASTY Left  06/08/2021   Procedure: TOTAL KNEE ARTHROPLASTY;  Surgeon: Kennedy Bucker, MD;  Location: [facility];  Service: Orthopedics;  Laterality: Left;    Family History  Problem Relation Age of Onset   Stroke Mother    Arthritis Mother    Breast cancer Sister 50   Breast cancer Sister    Cancer Brother 33       kidney   Kidney cancer Brother    Cancer Maternal Grandmother        Gallbladder   Stroke Paternal Grandmother     Social History   Tobacco Use   Smoking status: Former    Current packs/day: 0.00    Average packs/day: 0.3 packs/day for 2.0 years (0.5 ttl pk-yrs)    Types: Cigarettes    Start date: 73    Quit date: 1974    Years since quitting: 50.9   Smokeless tobacco: Never   Tobacco comments:    smoking cessation materials not required  Substance Use Topics   Alcohol use: No    Alcohol/week: 0.0 standard drinks of alcohol     Current Outpatient Medications:    acetaminophen (TYLENOL) 500 MG tablet, Take 500-1,000 mg by mouth every 6 (six) hours as needed (pain.)., Disp: , Rfl:    anastrozole (ARIMIDEX) 1 MG tablet, Take 1 tablet (1 mg total) by mouth daily., Disp: 90 tablet, Rfl: 1   aspirin 81 MG EC tablet, Take 81 mg by mouth in the morning., Disp: , Rfl:    Cholecalciferol (VITAMIN D-3 PO), Take 2,000 Units by mouth in the morning., Disp: , Rfl:    CINNAMON PO, Take 1,000 mg by mouth in the morning., Disp: , Rfl:    diclofenac Sodium (VOLTAREN) 1 % GEL, Apply 2 g topically 4 (four) times daily., Disp: , Rfl:    FISH OIL-BORAGE-FLAX-SAFFLOWER PO, Take 1 capsule by mouth in the morning., Disp: , Rfl:    LIDOCAINE-MENTHOL EX, Apply 1 application  topically 3 (three) times daily as needed (osteoarthritis pain.). [employer] Maximum Strength Topical Pain Relief Cream (4 % Lidocaine/Menthol), Disp: , Rfl:    Multiple Vitamin (MULTIVITAMIN) capsule, Take 1 capsule by mouth daily., Disp: , Rfl:    rosuvastatin (CRESTOR) 5 MG tablet, Take 1 tablet (5 mg total) by mouth  every Monday, Wednesday, and Friday., Disp: 36 tablet, Rfl: 3   triamterene-hydrochlorothiazide (MAXZIDE-25) 37.5-25 MG tablet, Take 1 tablet by mouth daily., Disp: 90 tablet, Rfl: 1   loratadine (CLARITIN) 10 MG tablet, Take 1 tablet (10 mg total) by mouth daily. (Patient not taking: Reported on 07/26/2022), Disp: 90 tablet, Rfl: 1  Allergies  Allergen Reactions   Altace [Ramipril] Swelling and Other (See Comments)    lips    I personally reviewed active problem list, medication list, allergies, family history, social history, health maintenance with the patient/caregiver today.   ROS  Ten systems reviewed and is negative except as  mentioned in HPI    Objective  Vitals:   11/29/22 0840  BP: (!) 160/84  Pulse: 85  Resp: 18  Temp: 97.9 F (36.6 C)  TempSrc: Oral  SpO2: 98%  Weight: 202 lb 4.8 oz (91.8 kg)  Height: 5\' 7"  (1.702 m)    Body mass index is 31.68 kg/m.  Physical Exam  Constitutional: Patient appears well-developed and well-nourished.  No distress.  HEENT: head atraumatic, normocephalic, pupils equal and reactive to light, neck supple, throat within normal limits Cardiovascular: Normal rate, regular rhythm and normal heart sounds.  No murmur heard. No BLE edema. Pulmonary/Chest: Effort normal and breath sounds normal. No respiratory distress. Abdominal: Soft.  There is no tenderness. Psychiatric: Patient has a normal mood and affect. behavior is normal. Judgment and thought content normal.  Muscular skeletal: trigger finger - both hands right finger - OA changes    PHQ2/9:    11/29/2022    8:41 AM 05/28/2022    9:52 AM 01/30/2022   11:01 AM 01/25/2022    9:12 AM 09/14/2021   10:49 AM  Depression screen PHQ 2/9  Decreased Interest 0 0 0 0 0  Down, Depressed, Hopeless 0 0 0 0 0  PHQ - 2 Score 0 0 0 0 0  Altered sleeping 0 0 0 0 0  Tired, decreased energy 0 0 0 0 0  Change in appetite 0 0 0 0 0  Feeling bad or failure about yourself  0 0 0 0 0  Trouble  concentrating 0 0 0 0 0  Moving slowly or fidgety/restless 0 0 0 0 0  Suicidal thoughts 0 0 0 0 0  PHQ-9 Score 0 0 0 0 0  Difficult doing work/chores   Not difficult at all  Not difficult at all    phq 9 is negative   Fall Risk:    11/29/2022    8:41 AM 05/28/2022    9:52 AM 01/30/2022   11:01 AM 01/25/2022    9:12 AM 09/14/2021   10:49 AM  Fall Risk   Falls in the past year? 0 0 0 0 0  Number falls in past yr:  0 0  0  Injury with Fall?  0 0  0  Risk for fall due to : Impaired balance/gait Impaired balance/gait;Impaired mobility No Fall Risks No Fall Risks No Fall Risks  Follow up Falls prevention discussed;Education provided;Falls evaluation completed Falls prevention discussed Falls prevention discussed;Education provided;Falls evaluation completed Falls prevention discussed;Education provided;Falls evaluation completed Falls prevention discussed;Education provided      Functional Status Survey: Is the patient deaf or have difficulty hearing?: No Does the patient have difficulty seeing, even when wearing glasses/contacts?: No Does the patient have difficulty concentrating, remembering, or making decisions?: No Does the patient have difficulty walking or climbing stairs?: Yes Does the patient have difficulty dressing or bathing?: No Does the patient have difficulty doing errands alone such as visiting a doctor's office or shopping?: No    Assessment & Plan   1. Hyperlipidemia associated with type 2 diabetes mellitus (HCC)  - POCT HgB A1C - Urine Microalbumin w/creat. ratio - rosuvastatin (CRESTOR) 5 MG tablet; Take 1 tablet (5 mg total) by mouth every Monday, Wednesday, and Friday.  Dispense: 36 tablet; Refill: 3 - Lipid panel  2. Hypertension associated with type 2 diabetes mellitus (HCC)  - triamterene-hydrochlorothiazide (MAXZIDE-25) 37.5-25 MG tablet; Take 1 tablet by mouth daily.  Dispense: 90 tablet; Refill: 1 Adding toprol xl and return in one week for  bp check    3. Invasive carcinoma of breast [facility])  S/p mastectomy, seeing Dr. Cathie Hoops  4. Need for immunization against influenza  - Flu Vaccine Trivalent High Dose (Fluad)  5. S/P TKR (total knee replacement) using cement, left  stable  6. Vitamin D deficiency  - VITAMIN D 25 Hydroxy (Vit-D Deficiency, Fractures)  7. Trigger ring finger of right hand  May need to see ortho   8. Tenosynovitis of left wrist  We will avoid nsaid's due to high bp   9. Low serum potassium  - Potassium  10. Colon cancer screening  - Fecal Globin By Immunochemistry

## 2022-11-29 ENCOUNTER — Encounter: Payer: Self-pay | Admitting: Family Medicine

## 2022-11-29 ENCOUNTER — Encounter: Payer: Self-pay | Admitting: Oncology

## 2022-11-29 ENCOUNTER — Inpatient Hospital Stay (HOSPITAL_BASED_OUTPATIENT_CLINIC_OR_DEPARTMENT_OTHER): Payer: Medicare PPO | Admitting: Oncology

## 2022-11-29 ENCOUNTER — Ambulatory Visit (INDEPENDENT_AMBULATORY_CARE_PROVIDER_SITE_OTHER): Payer: Medicare PPO | Admitting: Family Medicine

## 2022-11-29 VITALS — BP 186/83 | Temp 96.2°F | Resp 18 | Wt 201.9 lb

## 2022-11-29 VITALS — BP 180/82 | HR 85 | Temp 97.9°F | Resp 18 | Ht 67.0 in | Wt 202.3 lb

## 2022-11-29 DIAGNOSIS — E559 Vitamin D deficiency, unspecified: Secondary | ICD-10-CM | POA: Diagnosis not present

## 2022-11-29 DIAGNOSIS — M25549 Pain in joints of unspecified hand: Secondary | ICD-10-CM | POA: Diagnosis not present

## 2022-11-29 DIAGNOSIS — M2550 Pain in unspecified joint: Secondary | ICD-10-CM | POA: Diagnosis not present

## 2022-11-29 DIAGNOSIS — Z23 Encounter for immunization: Secondary | ICD-10-CM | POA: Diagnosis not present

## 2022-11-29 DIAGNOSIS — Z96652 Presence of left artificial knee joint: Secondary | ICD-10-CM

## 2022-11-29 DIAGNOSIS — E1159 Type 2 diabetes mellitus with other circulatory complications: Secondary | ICD-10-CM | POA: Diagnosis not present

## 2022-11-29 DIAGNOSIS — C50919 Malignant neoplasm of unspecified site of unspecified female breast: Secondary | ICD-10-CM

## 2022-11-29 DIAGNOSIS — E876 Hypokalemia: Secondary | ICD-10-CM | POA: Diagnosis not present

## 2022-11-29 DIAGNOSIS — I10 Essential (primary) hypertension: Secondary | ICD-10-CM | POA: Diagnosis not present

## 2022-11-29 DIAGNOSIS — Z1211 Encounter for screening for malignant neoplasm of colon: Secondary | ICD-10-CM | POA: Diagnosis not present

## 2022-11-29 DIAGNOSIS — M65341 Trigger finger, right ring finger: Secondary | ICD-10-CM | POA: Diagnosis not present

## 2022-11-29 DIAGNOSIS — C50212 Malignant neoplasm of upper-inner quadrant of left female breast: Secondary | ICD-10-CM | POA: Diagnosis not present

## 2022-11-29 DIAGNOSIS — Z1722 Progesterone receptor negative status: Secondary | ICD-10-CM | POA: Diagnosis not present

## 2022-11-29 DIAGNOSIS — M25579 Pain in unspecified ankle and joints of unspecified foot: Secondary | ICD-10-CM | POA: Diagnosis not present

## 2022-11-29 DIAGNOSIS — M25562 Pain in left knee: Secondary | ICD-10-CM | POA: Diagnosis not present

## 2022-11-29 DIAGNOSIS — E785 Hyperlipidemia, unspecified: Secondary | ICD-10-CM | POA: Diagnosis not present

## 2022-11-29 DIAGNOSIS — E1169 Type 2 diabetes mellitus with other specified complication: Secondary | ICD-10-CM | POA: Diagnosis not present

## 2022-11-29 DIAGNOSIS — M65932 Unspecified synovitis and tenosynovitis, left forearm: Secondary | ICD-10-CM

## 2022-11-29 DIAGNOSIS — Z170 Estrogen receptor positive status [ER+]: Secondary | ICD-10-CM | POA: Diagnosis not present

## 2022-11-29 DIAGNOSIS — Z79811 Long term (current) use of aromatase inhibitors: Secondary | ICD-10-CM | POA: Diagnosis not present

## 2022-11-29 DIAGNOSIS — R232 Flushing: Secondary | ICD-10-CM | POA: Diagnosis not present

## 2022-11-29 DIAGNOSIS — I152 Hypertension secondary to endocrine disorders: Secondary | ICD-10-CM

## 2022-11-29 LAB — POCT GLYCOSYLATED HEMOGLOBIN (HGB A1C): Hemoglobin A1C: 6.1 % — AB (ref 4.0–5.6)

## 2022-11-29 MED ORDER — METOPROLOL SUCCINATE ER 25 MG PO TB24: 25.0000 mg | ORAL_TABLET | Freq: Every day | ORAL | 0 refills | Status: DC

## 2022-11-29 MED ORDER — ROSUVASTATIN CALCIUM 5 MG PO TABS: 5.0000 mg | ORAL_TABLET | ORAL | 3 refills | Status: DC

## 2022-11-29 MED ORDER — PREGABALIN 50 MG PO CAPS: 50.0000 mg | ORAL_CAPSULE | Freq: Every day | ORAL | 0 refills | Status: DC

## 2022-11-29 MED ORDER — TRIAMTERENE-HCTZ 37.5-25 MG PO TABS: 1.0000 | ORAL_TABLET | Freq: Every day | ORAL | 1 refills | Status: DC

## 2022-11-29 NOTE — Progress Notes (Signed)
Hematology/Oncology Progress note [phone] [phone]        CHIEF COMPLAINTS/PURPOSE OF CONSULTATION:  Left breast invasive carcinoma  ASSESSMENT & PLAN:   Cancer Staging  Invasive carcinoma of breast (HCC) Staging form: Breast, AJCC 8th Edition - Clinical: Stage IA (cT1c, cN0, cM0, G2, ER+, PR+, HER2-) - Signed by [name], MD on 01/16/2022 - Pathologic stage from 01/31/2022: Stage IA (pT1c, pN0, cM0, G2, ER+, PR+, HER2-, Oncotype DX score: 17) - Signed by [name], MD on 02/21/2022   Invasive carcinoma of breast (HCC) Left breast invasive carcinoma, ER+. PR+. HER2- s/p mastectomy,  pT1c pN0, Oncotype DX 17 Pathology was reviewed and discussed with patient.  No need for adjuvant chemotherapy  07/2020 Normal bone density  Labs are reviewed and discussed with patient. Continue Arimidex 1mg  daily. She has some difficulty tolerating due to arthralgia. We discussed about option of switching to Aromasin which may have overlapping side effects profile, but maybe better tolerated especially regarding musculoskeletal side effects like joint pain and stiffness She would like to finish her current supply and also utilize pain medication proscribed by pcp first. She knows to call my office if she decided to switch in the future.  Annual right screening mammogram - Nov 2025     Aromatase inhibitor use Nov 2024  DEXA -normal bone density.  Recommend calcium and vitamin D supplementation.   Arthralgia Likely due to osteoarthritis, pre-existing, worsen with AI Recommend topical Voltaren.    Orders Placed This Encounter  Procedures   CBC with Differential (Cancer Center Only)    Standing Status:   Future    Standing Expiration Date:   11/29/2023   CMP (Cancer Center only)    Standing Status:   Future    Standing Expiration Date:   11/29/2023   Follow-up 6 months  All questions were answered. The patient knows to call the clinic with any problems, questions or  concerns.  [name], MD, PhD [facility] Hematology Oncology 11/29/2022    HISTORY OF PRESENTING ILLNESS:  Stacy [name] 77 y.o. female presents to establish care for Stage I left breast invasive carcinoma I have reviewed her chart and materials related to her cancer extensively  Summary of oncologic history is as follows: Oncology History  Invasive carcinoma of breast (HCC)  11/21/2021 Mammogram   Bilateral screening mammogram showed In the left breast, a possible mass warrants further evaluation. In the right breast, no findings suspicious for malignancy.   12/14/2021 Mammogram   Left unilateral diagnostic mammogram/ ultrasound showed Suspicious 1.2 cm mass over the 10 o'clock position of the left breast 4 cm from the nipple. No pathologically enlarged Left axilla lymph node    01/12/2022 Initial Diagnosis   Invasive carcinoma of breast   -Left breast lesion ultrasound-guided biopsy showed invasive mammary carcinoma with mucinous features, grade 2, DCIS negative, LVI negative ER 90%, PR 20%, HER2 IHC 2+ equivocal, FISH negative.  Menarche at age of [age] First live birth at age of 76 OCP use: 5 years Menopausal status: Postmenopausal, LMP in 72s. 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 No pathogenic variants identified on the [facility] Multi-Cancer+RNA panel. The report date is 02/04/2022.  The Multi-Cancer + RNA Panel offered by [facility] includes sequencing and/or deletion/duplication  analysis of the following 70 genes:  AIP*, ALK, APC*, ATM*, AXIN2*, BAP1*, BARD1*, BLM*, BMPR1A*, BRCA1*, BRCA2*, BRIP1*, CDC73*, CDH1*, CDK4, CDKN1B*, CDKN2A, CHEK2*, CTNNA1*, DICER1*, EPCAM, EGFR, FH*, FLCN*, GREM1, HOXB13, KIT, LZTR1, MAX*, MBD4, MEN1*, MET, MITF, MLH1*, MSH2*, MSH3*, MSH6*, MUTYH*, NF1*, NF2*, NTHL1*, PALB2*, PDGFRA, PMS2*, POLD1*, POLE*, POT1*, PRKAR1A*, PTCH1*, PTEN*, RAD51C*, RAD51D*, RB1*, RET, SDHA*, SDHAF2*, SDHB*, SDHC*, SDHD*, SMAD4*, SMARCA4*, SMARCB1*, SMARCE1*, STK11*, SUFU*, TMEM127*, TP53*, TSC1*, TSC2*, VHL*. RNA analysis is performed for * genes.   01/31/2022 Surgery   S/p left simple mastectomy and SLNB  Invasive mammary carcinoma with focal mucinous features. Grade 2, LVI-, All margins negative for invasive carcinoma.  Four sentinel  lymph nodes negative for malignancy  pT1c pN0    01/31/2022 Cancer Staging   Staging form: Breast, AJCC 8th Edition - Pathologic stage from 01/31/2022: Stage IA (pT1c, pN0, cM0, G2, ER+, PR+, HER2-, Oncotype DX score: 17) - Signed by [name], MD on 02/21/2022 Stage prefix: Initial diagnosis Multigene prognostic tests performed: Oncotype DX Recurrence score range: Greater than or equal to 11 Histologic grading system: 3 grade system       INTERVAL HISTORY Stacy [name] is a 77 y.o. female who has above history reviewed by me today presents for follow up visit for Stage IA left breast invasive carcinoma, s/p left simple mastectomy and SLNB She takes Arimidex 1mg  daily Pain in the left knee and ankle, pre-existing prior to AI   + hot flash. + hand joint pain.     MEDICAL HISTORY:  Past Medical History:  Diagnosis Date   Allergy    Arthritis    Cancer (HCC)    GERD (gastroesophageal reflux disease)    Hyperlipidemia    Hypertension    Neoplasm of uncertain behavior of breast 2007   Pre-diabetes    Vertigo     SURGICAL HISTORY: Past Surgical History:  Procedure Laterality Date   BREAST BIOPSY Right 2015   3  stereo biopsies. 2 benign. one was radial scar, no excision done for radial scar   BREAST BIOPSY Left 07/16/2017   BIPHASIC STROMAL AND EPITHELIAL LESION./ Dr Lemar Livings   BREAST BIOPSY Left 08/23/2020   [place] bx of mass 5:00 2cmfn, coil marker, FIBROEPITHELIAL LESION WITH INTRACANALICULAR GROWTH PATTERN AND VARIABLE STROMAL HYPERCELLULARITY   BREAST BIOPSY Left 01/02/2022   [place] bx path pending 10:00 ribbon clip   BREAST BIOPSY Left 01/02/2022   [place] LT BREAST BX W LOC DEV 1ST LESION IMG BX SPEC US GUIDE 01/02/2022 [facility]   BREAST EXCISIONAL BIOPSY Left 1968   x 3 per pt   BREAST EXCISIONAL BIOPSY Right 2007   benign   BUNIONECTOMY Left    COLONOSCOPY  2011   DR,ISHAKIS   EXCISION OF BREAST LESION Left 10/07/2020   Procedure: EXCISION OF BREAST LESION;  Surgeon: Earline Mayotte, MD;  Location: [facility];  Service: General;  Laterality: Left; FIBROEPITHELIAL LESION WITH NTRACANALICULAR GROWTH PATTERN AND VARIABLE STROMAL   HAMMER TOE SURGERY Left    2nd toe   HEEL SPUR SURGERY Left 12/15/2014   Dr. Loreta Ave at [facility]   HEEL SPUR SURGERY Right    MASTECTOMY W/ SENTINEL NODE BIOPSY Left 01/31/2022   Procedure: MASTECTOMY WITH SENTINEL LYMPH NODE BIOPSY;  Surgeon: Carolan Shiver, MD;  Location: [facility];  Service: General;  Laterality: Left;   TOTAL KNEE ARTHROPLASTY Left 06/08/2021   Procedure: TOTAL KNEE ARTHROPLASTY;  Surgeon: Kennedy Bucker, MD;  Location: [facility];  Service: Orthopedics;  Laterality: Left;    SOCIAL HISTORY: Social History   Socioeconomic History   Marital status: Married    Spouse name: Richard   Number of children: 1   Years of education: Not on file   Highest education level: 12th grade  Occupational History   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  Tobacco Use   Smoking status: Former    Current packs/day: 0.00    Average packs/day: 0.3 packs/day for 2.0 years (0.5 ttl pk-yrs)    Types: Cigarettes    Start date: 72    Quit  date: 1974    Years since quitting: 50.9   Smokeless tobacco: Never   Tobacco comments:    smoking cessation materials not required  Vaping Use   Vaping status: Never Used  Substance and Sexual Activity   Alcohol use: No    Alcohol/week: 0.0 standard drinks of alcohol   Drug use: No   Sexual activity: Not Currently    Comment: Husband has prostate issues  Other Topics Concern   Not on file  Social History Narrative   Not on file   Social Determinants of Health   Financial Resource Strain: Low Risk  (02/15/2021)   Overall Financial Resource Strain (CARDIA)    Difficulty of Paying Living Expenses: Not hard at all  Food Insecurity: No Food Insecurity (12/22/2020)   Hunger Vital Sign    Worried About Running Out of Food in the Last Year: Never true    Ran Out of Food in the Last Year: Never true  Transportation Needs: No Transportation Needs (12/22/2020)   PRAPARE - Administrator, Civil Service (Medical): No    Lack of Transportation (Non-Medical): No  Physical Activity: Inactive (12/22/2020)   Exercise Vital Sign    Days of Exercise per Week: 0 days    Minutes of Exercise per Session: 0 min  Stress: No Stress Concern Present (12/22/2020)   [employer] of Occupational Health - Occupational Stress Questionnaire    Feeling of Stress : Not at all  Social Connections: Socially Integrated (12/22/2020)   Social Connection and Isolation Panel [NHANES]    Frequency of Communication with Friends and Family: More than three times a week    Frequency of Social Gatherings with Friends and Family: Once a week    Attends Religious Services: More than 4 times per year    Active Member of [employer] or Organizations: Yes    Attends Banker Meetings: 1 to 4 times per year    Marital Status: Married  Catering manager Violence: Not At Risk (12/22/2020)   Humiliation, Afraid, Rape, and Kick questionnaire    Fear of Current or Ex-Partner: No    Emotionally Abused: No     Physically Abused: No    Sexually Abused: No    FAMILY HISTORY: Family History  Problem Relation Age of Onset   Stroke Mother    Arthritis Mother    Breast cancer Sister 66   Breast cancer Sister    Cancer Brother 82       kidney   Kidney cancer Brother    Cancer Maternal Grandmother        Gallbladder   Stroke Paternal Grandmother  ALLERGIES:  is allergic to altace [ramipril].  MEDICATIONS:  Current Outpatient Medications  Medication Sig Dispense Refill   acetaminophen (TYLENOL) 500 MG tablet Take 500-1,000 mg by mouth every 6 (six) hours as needed (pain.).     anastrozole (ARIMIDEX) 1 MG tablet Take 1 tablet (1 mg total) by mouth daily. 90 tablet 1   aspirin 81 MG EC tablet Take 81 mg by mouth in the morning.     Cholecalciferol (VITAMIN D-3 PO) Take 2,000 Units by mouth in the morning.     CINNAMON PO Take 1,000 mg by mouth in the morning.     diclofenac Sodium (VOLTAREN) 1 % GEL Apply 2 g topically 4 (four) times daily.     FISH OIL-BORAGE-FLAX-SAFFLOWER PO Take 1 capsule by mouth in the morning.     loratadine (CLARITIN) 10 MG tablet Take 1 tablet (10 mg total) by mouth daily. 90 tablet 1   metoprolol succinate (TOPROL-XL) 25 MG 24 hr tablet Take 1 tablet (25 mg total) by mouth daily. 30 tablet 0   Multiple Vitamin (MULTIVITAMIN) capsule Take 1 capsule by mouth daily.     pregabalin (LYRICA) 50 MG capsule Take 1 capsule (50 mg total) by mouth at bedtime. pain 30 capsule 0   [START ON 11/30/2022] rosuvastatin (CRESTOR) 5 MG tablet Take 1 tablet (5 mg total) by mouth every Monday, Wednesday, and Friday. 36 tablet 3   triamterene-hydrochlorothiazide (MAXZIDE-25) 37.5-25 MG tablet Take 1 tablet by mouth daily. 90 tablet 1   LIDOCAINE-MENTHOL EX Apply 1 application  topically 3 (three) times daily as needed (osteoarthritis pain.). [employer]'s Best Maximum Strength Topical Pain Relief Cream (4 % Lidocaine/Menthol) (Patient not taking: Reported on 11/29/2022)     No current  facility-administered medications for this visit.    Review of Systems  Constitutional:  Negative for appetite change, chills, fatigue and fever.  HENT:   Negative for hearing loss and voice change.   Eyes:  Negative for eye problems.  Respiratory:  Negative for chest tightness and cough.   Cardiovascular:  Negative for chest pain.  Gastrointestinal:  Negative for abdominal distention, abdominal pain and blood in stool.  Endocrine: Negative for hot flashes.  Genitourinary:  Negative for difficulty urinating and frequency.   Musculoskeletal:  Negative for arthralgias.  Skin:  Negative for itching and rash.  Neurological:  Negative for extremity weakness.  Hematological:  Negative for adenopathy.  Psychiatric/Behavioral:  Negative for confusion.      PHYSICAL EXAMINATION: ECOG PERFORMANCE STATUS: 0 - Asymptomatic  Vitals:   11/29/22 1019 11/29/22 1021  BP: (!) 185/85 (!) 186/83  Resp: 18   Temp: (!) 96.2 F (35.7 C)    Filed Weights   11/29/22 1019  Weight: 201 lb 14.4 oz (91.6 kg)    Physical Exam Constitutional:      General: She is not in acute distress.    Appearance: She is not diaphoretic.  HENT:     Head: Normocephalic.     Nose: Nose normal.     Mouth/Throat:     Pharynx: No oropharyngeal exudate.  Eyes:     General: No scleral icterus.    Pupils: Pupils are equal, round, and reactive to light.  Cardiovascular:     Rate and Rhythm: Normal rate.  Pulmonary:     Effort: Pulmonary effort is normal. No respiratory distress.  Abdominal:     General: There is no distension.     Palpations: Abdomen is soft.  Musculoskeletal:  General: Normal range of motion.     Cervical back: Normal range of motion.  Skin:    General: Skin is warm and dry.     Findings: No erythema.  Neurological:     Mental Status: She is alert and oriented to person, place, and time. Mental status is at baseline.     Cranial Nerves: No cranial nerve deficit.     Motor: No  abnormal muscle tone.  Psychiatric:        Mood and Affect: Affect normal.     LABORATORY DATA:  I have reviewed the data as listed    Latest Ref Rng & Units 11/26/2022    9:49 AM 05/23/2022    9:56 AM 02/01/2022    4:27 AM  CBC  WBC 4.0 - 10.5 K/uL 4.1  3.7  10.0   Hemoglobin 12.0 - 15.0 g/dL 09.8  11.9  14.7   Hematocrit 36.0 - 46.0 % 40.3  37.9  35.1   Platelets 150 - 400 K/uL 237  222  231       Latest Ref Rng & Units 11/26/2022    9:49 AM 05/23/2022    9:56 AM 02/01/2022    4:27 AM  CMP  Glucose 70 - 99 mg/dL 829  562  130   BUN 8 - 23 mg/dL 15  13  19    Creatinine 0.44 - 1.00 mg/dL 8.65  7.84  6.96   Sodium 135 - 145 mmol/L 140  139  137   Potassium 3.5 - 5.1 mmol/L 3.4  3.7  3.5   Chloride 98 - 111 mmol/L 103  103  104   CO2 22 - 32 mmol/L 26  28  27    Calcium 8.9 - 10.3 mg/dL 9.4  9.3  8.6   Total Protein 6.5 - 8.1 g/dL 7.9  7.7    Total Bilirubin <1.2 mg/dL 0.7  0.8    Alkaline Phos 38 - 126 U/L 59  53    AST 15 - 41 U/L 23  22    ALT 0 - 44 U/L 21  19       RADIOGRAPHIC STUDIES: I have personally reviewed the radiological images as listed and agreed with the findings in the report. MM 3D SCREENING MAMMOGRAM UNILATERAL RIGHT BREAST  Result Date: 11/27/2022 CLINICAL DATA:  Screening. EXAM: DIGITAL SCREENING UNILATERAL RIGHT MAMMOGRAM WITH CAD AND TOMOSYNTHESIS TECHNIQUE: Right screening digital craniocaudal and mediolateral oblique mammograms were obtained. Right screening digital breast tomosynthesis was performed. The images were evaluated with computer-aided detection. COMPARISON:  Previous exam(s). ACR Breast Density Category c: The breasts are heterogeneously dense, which may obscure small masses. FINDINGS: The patient has had a left mastectomy. There are no findings suspicious for malignancy. IMPRESSION: No mammographic evidence of malignancy. A result letter of this screening mammogram will be mailed directly to the patient. RECOMMENDATION: Screening mammogram  in one year.  (Code:SM-R-31M) BI-RADS CATEGORY  1: Negative. Electronically Signed   By: Ted Mcalpine M.D.   On: 11/27/2022 16:46   DG Bone Density  Result Date: 11/26/2022 EXAM: DUAL X-RAY ABSORPTIOMETRY (DXA) FOR BONE MINERAL DENSITY IMPRESSION: Your patient Stacy [name] completed a BMD test on 11/26/2022 using the [employer] DXA System (software version: 14.10) manufactured by [employer]. The following summarizes the results of our evaluation. Technologist:VLM PATIENT BIOGRAPHICAL: Name: Stacy, [name] Patient ID: [id number] Birth Date: 1945-07-22 Height: 67.0 in. Gender: Female Exam Date: 11/26/2022 Weight: 195.0 lbs. Indications: Advanced Age, Diabetic, History of Breast  Cancer, Postmenopausal Fractures: Treatments: DENSITOMETRY RESULTS: Site         Region     Measured Date Measured Age WHO Classification Young Adult T-score BMD         %Change vs. Previous Significant Change (*) DualFemur Neck Right 11/26/2023 76.0 Normal -0.9 0.907 g/cm2 -0.3% - DualFemur Neck Right 08/03/2020 73.7 Normal -0.9 0.910 g/cm2 -4.2% - DualFemur Neck Right 04/23/2012 65.4 Normal -0.6 0.950 g/cm2 - - DualFemur Total Mean 11/26/2022 76.0 Normal -0.4 0.960 g/cm2 -1.4% - DualFemur Total Mean 08/03/2020 73.7 Normal -0.3 0.974 g/cm2 -3.1% Yes DualFemur Total Mean 04/23/2012 65.4 Normal 0.0 1.005 g/cm2 - - Left Forearm Radius 33% 11/26/2022 76.0 Normal -0.1 0.864 g/cm2 5.0% Yes Left Forearm Radius 33% 08/03/2020 73.7 Normal -0.6 0.823 g/cm2 -8.7% Yes Left Forearm Radius 33% 04/23/2012 65.4 Normal 0.3 0.901 g/cm2 - - ASSESSMENT: The BMD measured at Femur Neck Right is 0.907 g/cm2 with a T-score of -0.9. This patient's diagnostic category is NORMAL according to World Health Organization [facility]) criteria. Lumbar spine was not utilized due to advanced degenerative changes. Compared with prior study, there has been no significant change in the total hip. The scan quality is good. World Science writer [facility]) criteria  for post-menopausal, Caucasian Women: Normal:                   T-score at or above -1 SD Osteopenia/low bone mass: T-score between -1 and -2.5 SD Osteoporosis:             T-score at or below -2.5 SD RECOMMENDATIONS: 1. All patients should optimize calcium and vitamin D intake. 2. Consider FDA-approved medical therapies in postmenopausal women and men aged 55 years and older, based on the following: a. A hip or vertebral(clinical or morphometric) fracture b. T-score < -2.5 at the femoral neck or spine after appropriate evaluation to exclude secondary causes c. Low bone mass (T-score between -1.0 and -2.5 at the femoral neck or spine) and a 10-year probability of a hip fracture > 3% or a 10-year probability of a major osteoporosis-related fracture > 20% based on the [place]-adapted WHO algorithm 3. Clinician judgment and/or patient preferences may indicate treatment for people with 10-year fracture probabilities above or below these levels FOLLOW-UP: People with diagnosed cases of osteoporosis or at high risk for fracture should have regular bone mineral density tests. For patients eligible for Medicare, routine testing is allowed once every 2 years. The testing frequency can be increased to one year for patients who have rapidly progressing disease, those who are receiving or discontinuing medical therapy to restore bone mass, or have additional risk factors. I have reviewed this report, and agree with the above findings. [facility] Electronically Signed   By: Harmon Pier M.D.   On: 11/26/2022 09:29

## 2022-11-29 NOTE — Assessment & Plan Note (Addendum)
Nov 2024  DEXA -normal bone density.  Recommend calcium and vitamin D supplementation.

## 2022-11-29 NOTE — Assessment & Plan Note (Signed)
Likely due to osteoarthritis, pre-existing, worsen with AI Recommend topical Voltaren.

## 2022-11-29 NOTE — Assessment & Plan Note (Addendum)
Left breast invasive carcinoma, ER+. PR+. HER2- s/p mastectomy,  pT1c pN0, Oncotype DX 17 Pathology was reviewed and discussed with patient.  No need for adjuvant chemotherapy  07/2020 Normal bone density  Labs are reviewed and discussed with patient. Continue Arimidex 1mg  daily. She has some difficulty tolerating due to arthralgia. We discussed about option of switching to Aromasin which may have overlapping side effects profile, but maybe better tolerated especially regarding musculoskeletal side effects like joint pain and stiffness She would like to finish her current supply and also utilize pain medication proscribed by pcp first. She knows to call my office if she decided to switch in the future.  Annual right screening mammogram - Nov 2025

## 2022-11-30 LAB — MICROALBUMIN / CREATININE URINE RATIO
Creatinine, Urine: 35 mg/dL (ref 20–275)
Microalb Creat Ratio: 9 mg/g{creat} (ref <30)
Microalb, Ur: 0.3 mg/dL

## 2022-11-30 LAB — VITAMIN D 25 HYDROXY (VIT D DEFICIENCY, FRACTURES): Vit D, 25-Hydroxy: 36 ng/mL (ref 30–100)

## 2022-11-30 LAB — LIPID PANEL
Cholesterol: 146 mg/dL (ref <200)
HDL: 80 mg/dL (ref >=50)
LDL Cholesterol (Calc): 53 mg/dL
Non-HDL Cholesterol (Calc): 66 mg/dL (ref <130)
Total CHOL/HDL Ratio: 1.8 (calc) (ref <5.0)
Triglycerides: 54 mg/dL (ref <150)

## 2022-11-30 LAB — POTASSIUM: Potassium: 4.1 mmol/L (ref 3.5–5.3)

## 2022-12-04 DIAGNOSIS — Z170 Estrogen receptor positive status [ER+]: Secondary | ICD-10-CM | POA: Diagnosis not present

## 2022-12-04 DIAGNOSIS — C50212 Malignant neoplasm of upper-inner quadrant of left female breast: Secondary | ICD-10-CM | POA: Diagnosis not present

## 2022-12-04 DIAGNOSIS — E1169 Type 2 diabetes mellitus with other specified complication: Secondary | ICD-10-CM | POA: Diagnosis not present

## 2022-12-04 DIAGNOSIS — E559 Vitamin D deficiency, unspecified: Secondary | ICD-10-CM | POA: Diagnosis not present

## 2022-12-04 DIAGNOSIS — Z1211 Encounter for screening for malignant neoplasm of colon: Secondary | ICD-10-CM | POA: Diagnosis not present

## 2022-12-04 DIAGNOSIS — E876 Hypokalemia: Secondary | ICD-10-CM | POA: Diagnosis not present

## 2022-12-04 DIAGNOSIS — E785 Hyperlipidemia, unspecified: Secondary | ICD-10-CM | POA: Diagnosis not present

## 2022-12-06 ENCOUNTER — Ambulatory Visit: Payer: Medicare PPO

## 2022-12-06 VITALS — BP 138/70

## 2022-12-06 DIAGNOSIS — Z0130 Encounter for examination of blood pressure without abnormal findings: Secondary | ICD-10-CM

## 2022-12-07 ENCOUNTER — Ambulatory Visit: Payer: Medicare PPO

## 2022-12-10 ENCOUNTER — Ambulatory Visit (INDEPENDENT_AMBULATORY_CARE_PROVIDER_SITE_OTHER): Payer: Medicare PPO | Admitting: Podiatry

## 2022-12-10 ENCOUNTER — Encounter: Payer: Self-pay | Admitting: Podiatry

## 2022-12-10 VITALS — Ht 67.0 in | Wt 201.0 lb

## 2022-12-10 DIAGNOSIS — M79674 Pain in right toe(s): Secondary | ICD-10-CM

## 2022-12-10 DIAGNOSIS — M79675 Pain in left toe(s): Secondary | ICD-10-CM

## 2022-12-10 DIAGNOSIS — E119 Type 2 diabetes mellitus without complications: Secondary | ICD-10-CM | POA: Diagnosis not present

## 2022-12-10 DIAGNOSIS — B351 Tinea unguium: Secondary | ICD-10-CM

## 2022-12-12 LAB — FECAL GLOBIN BY IMMUNOCHEMISTRY
FECAL GLOBIN RESULT:: NOT DETECTED
MICRO NUMBER:: 15768564
SPECIMEN QUALITY:: ADEQUATE

## 2022-12-15 ENCOUNTER — Encounter: Payer: Self-pay | Admitting: Podiatry

## 2022-12-15 NOTE — Progress Notes (Signed)
  Subjective:  Patient ID: Stacy [name], female    DOB: 1945/12/15,  MRN: [id number]  77 y.o. female presents to clinic with  painful thick toenails that are difficult to trim. Pain interferes with ambulation. Aggravating factors include wearing enclosed shoe gear. Pain is relieved with periodic professional debridement.  Chief Complaint  Patient presents with   Nail Problem    Patient is here for routine foot care   New problem(s): None   PCP is Alba Cory, MD.  Allergies  Allergen Reactions   Altace [Ramipril] Swelling and Other (See Comments)    lips    Review of Systems: Negative except as noted in the HPI.   Objective:  Stacy [name] is a pleasant 77 y.o. female WD, WN in NAD.Marland Kitchen AAO x 3.  Vascular Examination: Vascular status intact b/l with palpable pedal pulses. CFT immediate b/l. No edema. No pain with calf compression b/l. Skin temperature gradient WNL b/l. No cyanosis or clubbing noted b/l LE.  Neurological Examination: Sensation grossly intact b/l with 10 gram monofilament. Vibratory sensation intact b/l.   Dermatological Examination: Pedal skin with normal turgor, texture and tone b/l. Toenails 1-5 b/l thick, discolored, elongated with subungual debris and pain on dorsal palpation. No hyperkeratotic lesions noted b/l.   Musculoskeletal Examination: Muscle strength 5/5 to b/l LE. No pain, crepitus or joint limitation noted with ROM bilateral LE. No gross bony deformities bilaterally.  Radiographs: None  Last A1c:      Latest Ref Rng & Units 11/29/2022    8:43 AM 05/28/2022    9:54 AM 01/25/2022    9:14 AM  Hemoglobin A1C  Hemoglobin-A1c 4.0 - 5.6 % 6.1  5.7  5.8      Assessment:   1. Pain due to onychomycosis of toenails of both feet   2. Diabetes mellitus type 2, diet-controlled (HCC)    Plan:  -Patient was evaluated today. All questions/concerns addressed on today's visit. -Continue supportive shoe gear daily. -Mycotic toenails 1-5  bilaterally were debrided in length and girth with sterile nail nippers and dremel without incident. -Patient/POA to call should there be question/concern in the interim.  Return in about 3 months (around 03/12/2023).  Freddie Breech, DPM      [place] LOCATION: [address], [place] [address]                   Office [phone]   [facility] LOCATION: [address] [place], [place] [address] Office [phone]

## 2022-12-26 ENCOUNTER — Other Ambulatory Visit: Payer: Self-pay | Admitting: Family Medicine

## 2022-12-31 ENCOUNTER — Ambulatory Visit: Payer: Self-pay

## 2022-12-31 NOTE — Telephone Encounter (Signed)
Chief Complaint: Hand Pain  Symptoms: bilateral hand pain and wrist, left hand is hurting more than the right hand, right hand has an ache, 7/10 pain  Frequency: comes and goes Pertinent Negatives: Patient denies fever, redness, arm pain  Disposition: [] ED /[] Urgent Care (no appt availability in office) / [] Appointment(In office/virtual)/ []  [place] [facility]/ [] Home Care/ [] Refused Recommended Disposition /[] [place] [facility]/ []  Follow-up with PCP Additional Notes: Patient  states she continues to have hand pain after starting Pregabalin 50 MG on 11/29/22. Patient states the left hand is hurting more than the right hand at this time. She states the left hand hurts up to the wrist and it makes holding things in the hand difficult. Patient states she is on another medication that can cause ongoing joint pain called Anastrozole. Patient states she thinks the Pregablin is helping a little but pain is still 7/10 most days. Care advice given and patient has been scheduled for a follow-up on 01/04/23.  Reason for Disposition  [1] MODERATE pain (e.g., interferes with normal activities) AND [2] present > 3 days  Answer Assessment - Initial Assessment Questions 1. ONSET: "When did the pain start?"     Ongoing pain since November  2. LOCATION: "Where is the pain located?"     Left hand and wrist  3. PAIN: "How bad is the pain?" (Scale 1-10; or mild, moderate, severe)   - MILD (1-3): doesn't interfere with normal activities   - MODERATE (4-7): interferes with normal activities (e.g., work or school) or awakens from sleep   - SEVERE (8-10): excruciating pain, unable to use hand at all     7/10 4. WORK OR EXERCISE: "Has there been any recent work or exercise that involved this part (i.e., hand or wrist) of the body?"     No  5. CAUSE: "What do you think is causing the pain?"     Tendonitis  6. AGGRAVATING FACTORS: "What makes the pain worse?" (e.g., using computer)     Opening jars or doors  makes the pain worse  7. OTHER SYMPTOMS: "Do you have any other symptoms?" (e.g., neck pain, swelling, rash, numbness, fever)     Sometimes swelling in the hands, pain in the fingers  Protocols used: Hand and Wrist Pain-A-AH

## 2023-01-01 NOTE — Progress Notes (Signed)
Name: Stacy [name]   MRN: [id number]    DOB: 04/01/1945   Date:01/04/2023       Progress Note  Subjective  Chief Complaint  Chief Complaint  Patient presents with   Hand Pain    Bilateral hands, Patient  states she continues to have hand pain after starting Pregabalin 50 MG on 11/29/22. Patient states the left hand is hurting more than the right hand at this time    HPI Discussed the use of AI scribe software for clinical note transcription with the patient, who gave verbal consent to proceed.  Discussed the use of AI scribe software for clinical note transcription with the patient, who gave verbal consent to proceed.  History of Present Illness   The patient, with a history of hypertension, presented with a complaint of persistent pain in the left wrist and thumb. The pain, described as an ache, has been present for an extended period and is exacerbated by movement and pressure. The patient reported difficulty in using the hand due to the pain, particularly when attempting to grasp objects. There was no reported swelling, redness, or radiating pain up the arm. The patient also mentioned that the pain began after starting on anastrozole for breast cancer treatment.  The patient's hypertension was previously uncontrolled, leading to an adjustment in her medication regimen, including the addition of metoprolol. The patient reported adherence to the new regimen. The patient also has a history of arthritis.  The patient was previously prescribed pregabalin for pain management, but reported no relief from this medication. The patient's diabetes was reported to be under control, and there were no new symptoms or concerns related to this condition.            Patient Active Problem List   Diagnosis Date Noted   Aromatase inhibitor use 05/23/2022   Arthralgia 05/23/2022   Genetic testing 02/05/2022   Breast cancer (HCC) 01/31/2022   Invasive carcinoma of breast (HCC) 01/16/2022   Goals  of care, counseling/discussion 01/16/2022   S/P TKR (total knee replacement) using cement, left 06/08/2021   Hyperlipidemia associated with type 2 diabetes mellitus (HCC) 01/14/2020   Onychomycosis of multiple toenails with type 2 diabetes mellitus (HCC) 01/23/2019   Pain due to onychomycosis of toenails of both feet 10/06/2018   Abnormal EKG 12/10/2016   Radial scar of breast 05/30/2015   History of foot surgery 12/15/2014   Hypertension, benign 10/07/2014   Seasonal allergies 10/07/2014   Diabetes mellitus type 2, diet-controlled (HCC) 10/07/2014   Vitamin D deficiency 10/07/2014   Varicose veins 10/07/2014   Family history of breast cancer 05/12/2012    Past Surgical History:  Procedure Laterality Date   BREAST BIOPSY Right 2015   3 stereo biopsies. 2 benign. one was radial scar, no excision done for radial scar   BREAST BIOPSY Left 07/16/2017   BIPHASIC STROMAL AND EPITHELIAL LESION./ Dr Lemar Livings   BREAST BIOPSY Left 08/23/2020   [place] bx of mass 5:00 2cmfn, coil marker, FIBROEPITHELIAL LESION WITH INTRACANALICULAR GROWTH PATTERN AND VARIABLE STROMAL HYPERCELLULARITY   BREAST BIOPSY Left 01/02/2022   [place] bx path pending 10:00 ribbon clip   BREAST BIOPSY Left 01/02/2022   [place] LT BREAST BX W LOC DEV 1ST LESION IMG BX SPEC US GUIDE 01/02/2022 [facility]   BREAST EXCISIONAL BIOPSY Left 1968   x 3 per pt   BREAST EXCISIONAL BIOPSY Right 2007   benign   BUNIONECTOMY Left    COLONOSCOPY  2011   DR,ISHAKIS  EXCISION OF BREAST LESION Left 10/07/2020   Procedure: EXCISION OF BREAST LESION;  Surgeon: Earline Mayotte, MD;  Location: [facility];  Service: General;  Laterality: Left; FIBROEPITHELIAL LESION WITH NTRACANALICULAR GROWTH PATTERN AND VARIABLE STROMAL   HAMMER TOE SURGERY Left    2nd toe   HEEL SPUR SURGERY Left 12/15/2014   Dr. Loreta Ave at [facility]   HEEL SPUR SURGERY Right    MASTECTOMY W/ SENTINEL NODE BIOPSY Left 01/31/2022   Procedure: MASTECTOMY WITH SENTINEL  LYMPH NODE BIOPSY;  Surgeon: Carolan Shiver, MD;  Location: [facility];  Service: General;  Laterality: Left;   TOTAL KNEE ARTHROPLASTY Left 06/08/2021   Procedure: TOTAL KNEE ARTHROPLASTY;  Surgeon: Kennedy Bucker, MD;  Location: [facility];  Service: Orthopedics;  Laterality: Left;    Family History  Problem Relation Age of Onset   Stroke Mother    Arthritis Mother    Breast cancer Sister 10   Breast cancer Sister    Cancer Brother 81       kidney   Kidney cancer Brother    Cancer Maternal Grandmother        Gallbladder   Stroke Paternal Grandmother     Social History   Tobacco Use   Smoking status: Former    Current packs/day: 0.00    Average packs/day: 0.3 packs/day for 2.0 years (0.5 ttl pk-yrs)    Types: Cigarettes    Start date: 19    Quit date: 1974    Years since quitting: 51.0   Smokeless tobacco: Never   Tobacco comments:    smoking cessation materials not required  Substance Use Topics   Alcohol use: No    Alcohol/week: 0.0 standard drinks of alcohol     Current Outpatient Medications:    acetaminophen (TYLENOL) 500 MG tablet, Take 500-1,000 mg by mouth every 6 (six) hours as needed (pain.)., Disp: , Rfl:    anastrozole (ARIMIDEX) 1 MG tablet, Take 1 tablet (1 mg total) by mouth daily., Disp: 90 tablet, Rfl: 1   aspirin 81 MG EC tablet, Take 81 mg by mouth in the morning., Disp: , Rfl:    Cholecalciferol (VITAMIN D-3 PO), Take 2,000 Units by mouth in the morning., Disp: , Rfl:    CINNAMON PO, Take 1,000 mg by mouth in the morning., Disp: , Rfl:    diclofenac Sodium (VOLTAREN) 1 % GEL, Apply 2 g topically 4 (four) times daily., Disp: , Rfl:    FISH OIL-BORAGE-FLAX-SAFFLOWER PO, Take 1 capsule by mouth in the morning., Disp: , Rfl:    loratadine (CLARITIN) 10 MG tablet, Take 1 tablet (10 mg total) by mouth daily., Disp: 90 tablet, Rfl: 1   metoprolol succinate (TOPROL-XL) 25 MG 24 hr tablet, TAKE 1 TABLET(25 MG) BY MOUTH DAILY, Disp: 90 tablet, Rfl: 0    Multiple Vitamin (MULTIVITAMIN) capsule, Take 1 capsule by mouth daily., Disp: , Rfl:    pregabalin (LYRICA) 50 MG capsule, Take 1 capsule (50 mg total) by mouth at bedtime. pain, Disp: 30 capsule, Rfl: 0   rosuvastatin (CRESTOR) 5 MG tablet, Take 1 tablet (5 mg total) by mouth every Monday, Wednesday, and Friday., Disp: 36 tablet, Rfl: 3   triamterene-hydrochlorothiazide (MAXZIDE-25) 37.5-25 MG tablet, Take 1 tablet by mouth daily., Disp: 90 tablet, Rfl: 1  Allergies  Allergen Reactions   Altace [Ramipril] Swelling and Other (See Comments)    lips    I personally reviewed active problem list, medication list, allergies with the patient/caregiver today.   ROS  Ten systems reviewed and is negative except as mentioned in HPI    Objective  Vitals:   01/04/23 1405  BP: 134/70  Pulse: 94  Resp: 16  SpO2: 99%  Weight: 197 lb 8 oz (89.6 kg)  Height: 5\' 7"  (1.702 m)    Body mass index is 30.93 kg/m.  Physical Exam  Constitutional: Patient appears well-developed and well-nourished. Obese  No distress.  HEENT: head atraumatic, normocephalic,neck supple Cardiovascular: Normal rate, regular rhythm and normal heart sounds.  No murmur heard. No BLE edema. Pulmonary/Chest: Effort normal and breath sounds normal. No respiratory distress. Muscular skeletal: pain during flexion of thumb and medial deviation of left wrist, no redness or increase in warmth, pain during palpation of medial aspect of left wrist Abdominal: Soft.  There is no tenderness. Psychiatric: Patient has a normal mood and affect. behavior is normal. Judgment and thought content normal.   Recent Results (from the past 2160 hours)  CMP (Cancer Center only)     Status: Abnormal   Collection Time: 11/26/22  9:49 AM  Result Value Ref Range   Sodium 140 135 - 145 mmol/L   Potassium 3.4 (L) 3.5 - 5.1 mmol/L   Chloride 103 98 - 111 mmol/L   CO2 26 22 - 32 mmol/L   Glucose, Bld 100 (H) 70 - 99 mg/dL    Comment: Glucose  reference range applies only to samples taken after fasting for at least 8 hours.   BUN 15 8 - 23 mg/dL   Creatinine 4.09 8.11 - 1.00 mg/dL   Calcium 9.4 8.9 - 91.4 mg/dL   Total Protein 7.9 6.5 - 8.1 g/dL   Albumin 4.3 3.5 - 5.0 g/dL   AST 23 15 - 41 U/L   ALT 21 0 - 44 U/L   Alkaline Phosphatase 59 38 - 126 U/L   Total Bilirubin 0.7 <1.2 mg/dL   GFR, Estimated >78 >29 mL/min    Comment: (NOTE) Calculated using the CKD-EPI Creatinine Equation (2021)    Anion gap 11 5 - 15    Comment: Performed at [facility], [address] [place], [place] [address]  CBC with Differential (Cancer Center Only)     Status: None   Collection Time: 11/26/22  9:49 AM  Result Value Ref Range   WBC Count 4.1 4.0 - 10.5 K/uL   RBC 4.59 3.87 - 5.11 MIL/uL   Hemoglobin 12.9 12.0 - 15.0 g/dL   HCT 08.6 57.8 - 46.9 %   MCV 87.8 80.0 - 100.0 fL   MCH 28.1 26.0 - 34.0 pg   MCHC 32.0 30.0 - 36.0 g/dL   RDW 62.9 52.8 - 41.3 %   Platelet Count 237 150 - 400 K/uL   nRBC 0.0 0.0 - 0.2 %   Neutrophils Relative % 55 %   Neutro Abs 2.2 1.7 - 7.7 K/uL   Lymphocytes Relative 34 %   Lymphs Abs 1.4 0.7 - 4.0 K/uL   Monocytes Relative 6 %   Monocytes Absolute 0.3 0.1 - 1.0 K/uL   Eosinophils Relative 4 %   Eosinophils Absolute 0.2 0.0 - 0.5 K/uL   Basophils Relative 1 %   Basophils Absolute 0.0 0.0 - 0.1 K/uL   Immature Granulocytes 0 %   Abs Immature Granulocytes 0.01 0.00 - 0.07 K/uL    Comment: Performed at [facility], [address] [place], [place] [address]  POCT HgB A1C     Status: Abnormal   Collection Time: 11/29/22  8:43 AM  Result Value Ref Range   Hemoglobin A1C 6.1 (A) 4.0 - 5.6 %   HbA1c POC (<> result, manual entry)     HbA1c, POC (prediabetic range)     HbA1c, POC (controlled diabetic range)    Urine Microalbumin w/creat. ratio     Status: None   Collection Time: 11/29/22  9:29 AM  Result Value Ref Range   Creatinine, Urine 35 20 - 275 mg/dL   Microalb, Ur 0.3 mg/dL     Comment: Reference Range Not established    Microalb Creat Ratio 9 <30 mg/g creat    Comment: . The ADA defines abnormalities in albumin excretion as follows: Marland Kitchen Albuminuria Category        Result (mg/g creatinine) . Normal to Mildly increased   <30 Moderately increased         30-299  Severely increased           > OR = 300 . The ADA recommends that at least two of three specimens collected within a 3-6 month period be abnormal before considering a patient to be within a diagnostic category.   Lipid panel     Status: None   Collection Time: 11/29/22  9:29 AM  Result Value Ref Range   Cholesterol 146 <200 mg/dL   HDL 80 > OR = 50 mg/dL   Triglycerides 54 <034 mg/dL   LDL Cholesterol (Calc) 53 mg/dL (calc)    Comment: Reference range: <100 . Desirable range <100 mg/dL for primary prevention;   <70 mg/dL for patients with CHD or diabetic patients  with > or = 2 CHD risk factors. Marland Kitchen LDL-C is now calculated using the Martin-Hopkins  calculation, which is a validated novel method providing  better accuracy than the Friedewald equation in the  estimation of LDL-C.  Horald Pollen et al. Lenox Ahr. 7425;956(38): 2061-2068  (http://education.[facility].com/faq/FAQ164)    Total CHOL/HDL Ratio 1.8 <5.0 (calc)   Non-HDL Cholesterol (Calc) 66 <756 mg/dL (calc)    Comment: For patients with diabetes plus 1 major ASCVD risk  factor, treating to a non-HDL-C goal of <100 mg/dL  (LDL-C of <43 mg/dL) is considered a therapeutic  option.   VITAMIN D 25 Hydroxy (Vit-D Deficiency, Fractures)     Status: None   Collection Time: 11/29/22  9:29 AM  Result Value Ref Range   Vit D, 25-Hydroxy 36 30 - 100 ng/mL    Comment: Vitamin D Status         25-OH Vitamin D: . Deficiency:                    <20 ng/mL Insufficiency:             20 - 29 ng/mL Optimal:                 > or = 30 ng/mL . For 25-OH Vitamin D testing on patients on  D2-supplementation and patients for whom quantitation  of D2  and D3 fractions is required, the QuestAssureD(TM) 25-OH VIT D, (D2,D3), LC/MS/MS is recommended: order  code 32951 (patients >35yrs). . See Note 1 . Note 1 . For additional information, please refer to  http://education.[facility].com/faq/FAQ199  (This link is being provided for informational/ educational purposes only.)   Potassium     Status: None   Collection Time: 11/29/22  9:29 AM  Result Value Ref Range   Potassium 4.1 3.5 - 5.3 mmol/L  Fecal Globin By Immunochemistry     Status: None   Collection Time:  12/04/22 12:00 AM  Result Value Ref Range   MICRO NUMBER: [id number]    SPECIMEN QUALITY: Adequate    Source: INSURE (TM) FOBT TEST CARD    STATUS: FINAL    FECAL GLOBIN RESULT: Not Detected     Diabetic Foot Exam:     PHQ2/9:    11/29/2022    8:41 AM 05/28/2022    9:52 AM 01/30/2022   11:01 AM 01/25/2022    9:12 AM 09/14/2021   10:49 AM  Depression screen PHQ 2/9  Decreased Interest 0 0 0 0 0  Down, Depressed, Hopeless 0 0 0 0 0  PHQ - 2 Score 0 0 0 0 0  Altered sleeping 0 0 0 0 0  Tired, decreased energy 0 0 0 0 0  Change in appetite 0 0 0 0 0  Feeling bad or failure about yourself  0 0 0 0 0  Trouble concentrating 0 0 0 0 0  Moving slowly or fidgety/restless 0 0 0 0 0  Suicidal thoughts 0 0 0 0 0  PHQ-9 Score 0 0 0 0 0  Difficult doing work/chores   Not difficult at all  Not difficult at all    phq 9 is negative   Fall Risk:    11/29/2022    8:41 AM 05/28/2022    9:52 AM 01/30/2022   11:01 AM 01/25/2022    9:12 AM 09/14/2021   10:49 AM  Fall Risk   Falls in the past year? 0 0 0 0 0  Number falls in past yr:  0 0  0  Injury with Fall?  0 0  0  Risk for fall due to : Impaired balance/gait Impaired balance/gait;Impaired mobility No Fall Risks No Fall Risks No Fall Risks  Follow up Falls prevention discussed;Education provided;Falls evaluation completed Falls prevention discussed Falls prevention discussed;Education provided;Falls evaluation  completed Falls prevention discussed;Education provided;Falls evaluation completed Falls prevention discussed;Education provided     Assessment & Plan     Hypertension Well controlled with current regimen of metoprolol and triamterene HCTZ. Heart rate slightly elevated but within normal limits, likely due to pain. -Continue current medications. -Monitor blood pressure and heart rate.  Thumb Pain Likely De Quervain's tenosynovitis or arthritis of the thumb joint. Pain exacerbated by movement and pressure. No redness or swelling. Patient reports onset of pain after starting anastrozole, but localized pain unlikely to be related to medication. -Discontinue pregabalin as it has not provided relief. -Start Celebrex 100mg  twice daily for one week. If no improvement, discontinue due to potential side effects on kidney function and blood pressure. -Continue Tylenol as needed for pain. -Order thumb spica brace for left hand to be worn during the day. -If pain persists, consider referral to orthopedics for possible cortisone injection.  Diabetes No new issues reported. -Continue current management.  Follow-up Scheduled appointment in March. If thumb pain persists or worsens, patient to contact office for possible earlier appointment or referral to orthopedics.

## 2023-01-02 NOTE — Telephone Encounter (Signed)
Pt needs an appointment

## 2023-01-02 NOTE — Telephone Encounter (Signed)
Pt has an appt on 01/04/23

## 2023-01-04 ENCOUNTER — Ambulatory Visit: Payer: Medicare PPO | Admitting: Family Medicine

## 2023-01-04 ENCOUNTER — Encounter: Payer: Self-pay | Admitting: Family Medicine

## 2023-01-04 VITALS — BP 134/70 | HR 94 | Resp 16 | Ht 67.0 in | Wt 197.5 lb

## 2023-01-04 DIAGNOSIS — I152 Hypertension secondary to endocrine disorders: Secondary | ICD-10-CM

## 2023-01-04 DIAGNOSIS — I10 Essential (primary) hypertension: Secondary | ICD-10-CM

## 2023-01-04 DIAGNOSIS — M654 Radial styloid tenosynovitis [de Quervain]: Secondary | ICD-10-CM

## 2023-01-04 DIAGNOSIS — E1159 Type 2 diabetes mellitus with other circulatory complications: Secondary | ICD-10-CM

## 2023-01-04 MED ORDER — CELECOXIB 100 MG PO CAPS: 100.0000 mg | ORAL_CAPSULE | Freq: Two times a day (BID) | ORAL | 0 refills | Status: DC

## 2023-01-04 MED ORDER — THUMB BRACE MISC: 1.0000 | Freq: Every day | 0 refills | Status: AC

## 2023-01-30 ENCOUNTER — Telehealth: Payer: Self-pay | Admitting: Family Medicine

## 2023-01-30 NOTE — Telephone Encounter (Signed)
Copied from CRM [id number]. Topic: General - Other >> Jan 30, 2023  1:19 PM Phill Myron wrote: Please call pt. Stacy [name], Stacy [name]. She received a call stating that PCP Sowles is no longer in network with [employer].   Also, Pt Stacy [name] would like PCP Sowles to know that  she will no longer be taking the medications anastrozole (ARIMIDEX) 1 MG tablet and  celecoxib (CELEBREX) 100 MG capsule ... Please advise

## 2023-02-01 ENCOUNTER — Ambulatory Visit: Payer: Self-pay

## 2023-02-01 NOTE — Telephone Encounter (Signed)
  Chief Complaint: Hand and wrist pain Symptoms: pain Frequency: ongoing Pertinent Negatives: Patient denies  Disposition: [] ED /[] Urgent Care (no appt availability in office) / [] Appointment(In office/virtual)/ []  [place] [facility]/ [] Home Care/ [] Refused Recommended Disposition /[] [place] [facility]/ [x]  Follow-up with PCP Additional Notes: Pt called regarding her hand pain. Pt states that neither medication was at all effective in relieving her pain. She states that now both hands and wrists hurt. Pt would like a referral to ortho. Also pt would like to know what medication she can take to help with this pain.  Pt has discontinued Anastrozole. She would like for Dr. Carlynn Purl and Dr.Yu to work together to find another medication that would be effective for her. Please advise.

## 2023-02-01 NOTE — Telephone Encounter (Signed)
Message from Clide Dales sent at 02/01/2023  2:29 PM EST  Summary: Hand Pain   Patient state that she is having pain in her hands and would like to know what she can take to help relieve the pain.        LM on VM to CB.

## 2023-02-01 NOTE — Telephone Encounter (Signed)
Reason for Disposition . [1] MODERATE pain (e.g., interferes with normal activities) AND [2] present > 3 days  Answer Assessment - Initial Assessment Questions 1. ONSET: "When did the pain start?"     Ongoing 2. LOCATION: "Where is the pain located?"     Both wrists and hands now 3. PAIN: "How bad is the pain?" (Scale 1-10; or mild, moderate, severe)   - MILD (1-3): doesn't interfere with normal activities   - MODERATE (4-7): interferes with normal activities (e.g., work or school) or awakens from sleep   - SEVERE (8-10): excruciating pain, unable to use hand at all     Severe 5. CAUSE: "What do you think is causing the pain?"     Arthritis  Protocols used: Hand and Wrist Pain-A-AH

## 2023-02-01 NOTE — Telephone Encounter (Signed)
Message from Clide Dales sent at 02/01/2023  2:29 PM EST  Summary: Hand Pain   Patient state that she is having pain in her hands and would like to know what she can take to help relieve the pain.        Called pt and LM on VM to CB

## 2023-02-01 NOTE — Telephone Encounter (Signed)
3rd attempt, Patient called, left VM to return the call to the office to speak to the NT. Unable to reach patient after 3 attempts by [facility] NT, routing to the provider for resolution per protocol.   Summary: Hand Pain   Patient state that she is having pain in her hands and would like to know what she can take to help relieve the pain.         From LOV with Dr. Carlynn Purl on 01/04/23 Thumb Pain Likely De Quervain's tenosynovitis or arthritis of the thumb joint. Pain exacerbated by movement and pressure. No redness or swelling. Patient reports onset of pain after starting anastrozole, but localized pain unlikely to be related to medication. -Discontinue pregabalin as it has not provided relief. -Start Celebrex 100mg  twice daily for one week. If no improvement, discontinue due to potential side effects on kidney function and blood pressure. -Continue Tylenol as needed for pain. -Order thumb spica brace for left hand to be worn during the day. -If pain persists, consider referral to orthopedics for possible cortisone injection.

## 2023-02-04 ENCOUNTER — Other Ambulatory Visit: Payer: Self-pay | Admitting: Family Medicine

## 2023-02-04 DIAGNOSIS — M654 Radial styloid tenosynovitis [de Quervain]: Secondary | ICD-10-CM

## 2023-02-04 DIAGNOSIS — M65341 Trigger finger, right ring finger: Secondary | ICD-10-CM

## 2023-02-04 DIAGNOSIS — M79641 Pain in right hand: Secondary | ICD-10-CM

## 2023-02-05 ENCOUNTER — Telehealth: Payer: Self-pay | Admitting: Oncology

## 2023-02-05 ENCOUNTER — Other Ambulatory Visit: Payer: Self-pay | Admitting: Oncology

## 2023-02-05 MED ORDER — EXEMESTANE 25 MG PO TABS: 25.0000 mg | ORAL_TABLET | Freq: Every day | ORAL | 3 refills | Status: DC

## 2023-02-05 NOTE — Telephone Encounter (Signed)
Patient is here in my office for her husband's appointment. She reports arthralgia and is interested to try Aromasin. Recommend her to stop Arimidex and switch to Aromasin 25mg  daily. Rx sent.

## 2023-02-05 NOTE — Telephone Encounter (Signed)
Patient notified

## 2023-02-07 ENCOUNTER — Telehealth: Payer: Self-pay | Admitting: *Deleted

## 2023-02-07 DIAGNOSIS — M19041 Primary osteoarthritis, right hand: Secondary | ICD-10-CM | POA: Diagnosis not present

## 2023-02-07 DIAGNOSIS — M19042 Primary osteoarthritis, left hand: Secondary | ICD-10-CM | POA: Diagnosis not present

## 2023-02-07 DIAGNOSIS — M654 Radial styloid tenosynovitis [de Quervain]: Secondary | ICD-10-CM | POA: Diagnosis not present

## 2023-02-07 NOTE — Telephone Encounter (Signed)
as well as the email with her work for the [employer].   Email for the daughter is [email or web address] and the email for the HR is [email or web address].

## 2023-02-27 ENCOUNTER — Telehealth: Payer: Self-pay | Admitting: *Deleted

## 2023-02-27 NOTE — Telephone Encounter (Signed)
Daughter called yest and needs her form to help her mother with pain and aches while on    cancer meds. The paper was sent by email to her work because the require the papers sent email. It was sent in today

## 2023-02-27 NOTE — Telephone Encounter (Signed)
Daughter wants copy for thepaper for the fmla and a paper that says the the form went to her job and she wants for me to send it email and it did it today

## 2023-02-28 ENCOUNTER — Telehealth: Payer: Self-pay | Admitting: *Deleted

## 2023-02-28 DIAGNOSIS — M654 Radial styloid tenosynovitis [de Quervain]: Secondary | ICD-10-CM | POA: Diagnosis not present

## 2023-02-28 DIAGNOSIS — B351 Tinea unguium: Secondary | ICD-10-CM | POA: Diagnosis not present

## 2023-02-28 DIAGNOSIS — E1169 Type 2 diabetes mellitus with other specified complication: Secondary | ICD-10-CM | POA: Diagnosis not present

## 2023-02-28 NOTE — Telephone Encounter (Signed)
I sent it email to the benefits at work and cc.to her daughter Feliz Beam e date of 03/25/23

## 2023-03-08 ENCOUNTER — Telehealth: Payer: Self-pay | Admitting: *Deleted

## 2023-03-08 ENCOUNTER — Other Ambulatory Visit: Payer: Self-pay | Admitting: Oncology

## 2023-03-08 MED ORDER — LETROZOLE 2.5 MG PO TABS: 2.5000 mg | ORAL_TABLET | Freq: Every day | ORAL | 2 refills | Status: DC

## 2023-03-08 NOTE — Telephone Encounter (Signed)
Pt has 2 pills of exemestane and she wants to try letrozole -she has been reading about and she feels that that this one can try and see about if it works better for her

## 2023-03-13 DIAGNOSIS — M654 Radial styloid tenosynovitis [de Quervain]: Secondary | ICD-10-CM | POA: Diagnosis not present

## 2023-03-18 ENCOUNTER — Encounter: Payer: Self-pay | Admitting: Podiatry

## 2023-03-18 ENCOUNTER — Ambulatory Visit (INDEPENDENT_AMBULATORY_CARE_PROVIDER_SITE_OTHER): Payer: Medicare PPO | Admitting: Podiatry

## 2023-03-18 VITALS — Ht 67.0 in | Wt 197.5 lb

## 2023-03-18 DIAGNOSIS — E119 Type 2 diabetes mellitus without complications: Secondary | ICD-10-CM | POA: Diagnosis not present

## 2023-03-18 DIAGNOSIS — M79674 Pain in right toe(s): Secondary | ICD-10-CM | POA: Diagnosis not present

## 2023-03-18 DIAGNOSIS — R600 Localized edema: Secondary | ICD-10-CM | POA: Diagnosis not present

## 2023-03-18 DIAGNOSIS — M79675 Pain in left toe(s): Secondary | ICD-10-CM

## 2023-03-18 DIAGNOSIS — L600 Ingrowing nail: Secondary | ICD-10-CM

## 2023-03-18 DIAGNOSIS — B351 Tinea unguium: Secondary | ICD-10-CM

## 2023-03-18 NOTE — Progress Notes (Signed)
 ANNUAL DIABETIC FOOT EXAM  Subjective: Stacy [name] presents today for annual diabetic foot exam.  Chief Complaint  Patient presents with   Nail Problem    Pt is here for [facility] PCP is Dr Carlynn Purl and LOV was in December.   Patient confirms h/o diabetes.  Patient denies any h/o foot wounds.  Stacy Cory, MD is patient's PCP.  Past Medical History:  Diagnosis Date   Allergy    Arthritis    Cancer (HCC)    GERD (gastroesophageal reflux disease)    Hyperlipidemia    Hypertension    Neoplasm of uncertain behavior of breast 2007   Pre-diabetes    Vertigo    Patient Active Problem List   Diagnosis Date Noted   Aromatase inhibitor use 05/23/2022   Arthralgia 05/23/2022   Genetic testing 02/05/2022   Breast cancer (HCC) 01/31/2022   Invasive carcinoma of breast (HCC) 01/16/2022   Goals of care, counseling/discussion 01/16/2022   S/P TKR (total knee replacement) using cement, left 06/08/2021   Hyperlipidemia associated with type 2 diabetes mellitus (HCC) 01/14/2020   Onychomycosis of multiple toenails with type 2 diabetes mellitus (HCC) 01/23/2019   Pain due to onychomycosis of toenails of both feet 10/06/2018   Abnormal EKG 12/10/2016   Radial scar of breast 05/30/2015   History of foot surgery 12/15/2014   Hypertension, benign 10/07/2014   Seasonal allergies 10/07/2014   Diabetes mellitus type 2, diet-controlled (HCC) 10/07/2014   Vitamin D deficiency 10/07/2014   Varicose veins 10/07/2014   Family history of breast cancer 05/12/2012   Past Surgical History:  Procedure Laterality Date   BREAST BIOPSY Right 2015   3 stereo biopsies. 2 benign. one was radial scar, no excision done for radial scar   BREAST BIOPSY Left 07/16/2017   BIPHASIC STROMAL AND EPITHELIAL LESION./ Dr Lemar Livings   BREAST BIOPSY Left 08/23/2020   [place] bx of mass 5:00 2cmfn, coil marker, FIBROEPITHELIAL LESION WITH INTRACANALICULAR GROWTH PATTERN AND VARIABLE STROMAL HYPERCELLULARITY   BREAST  BIOPSY Left 01/02/2022   [place] bx path pending 10:00 ribbon clip   BREAST BIOPSY Left 01/02/2022   [place] LT BREAST BX W LOC DEV 1ST LESION IMG BX SPEC US GUIDE 01/02/2022 [facility]   BREAST EXCISIONAL BIOPSY Left 1968   x 3 per pt   BREAST EXCISIONAL BIOPSY Right 2007   benign   BUNIONECTOMY Left    COLONOSCOPY  2011   DR,ISHAKIS   EXCISION OF BREAST LESION Left 10/07/2020   Procedure: EXCISION OF BREAST LESION;  Surgeon: Earline Mayotte, MD;  Location: [facility];  Service: General;  Laterality: Left; FIBROEPITHELIAL LESION WITH NTRACANALICULAR GROWTH PATTERN AND VARIABLE STROMAL   HAMMER TOE SURGERY Left    2nd toe   HEEL SPUR SURGERY Left 12/15/2014   Dr. Loreta Ave at [facility]   HEEL SPUR SURGERY Right    MASTECTOMY W/ SENTINEL NODE BIOPSY Left 01/31/2022   Procedure: MASTECTOMY WITH SENTINEL LYMPH NODE BIOPSY;  Surgeon: Carolan Shiver, MD;  Location: [facility];  Service: General;  Laterality: Left;   TOTAL KNEE ARTHROPLASTY Left 06/08/2021   Procedure: TOTAL KNEE ARTHROPLASTY;  Surgeon: Kennedy Bucker, MD;  Location: [facility];  Service: Orthopedics;  Laterality: Left;   Current Outpatient Medications on File Prior to Visit  Medication Sig Dispense Refill   acetaminophen (TYLENOL) 500 MG tablet Take 500-1,000 mg by mouth every 6 (six) hours as needed (pain.).     aspirin 81 MG EC tablet Take 81 mg by mouth in  the morning.     celecoxib (CELEBREX) 100 MG capsule Take 1 capsule (100 mg total) by mouth 2 (two) times daily. 60 capsule 0   Cholecalciferol (VITAMIN D-3 PO) Take 2,000 Units by mouth in the morning.     CINNAMON PO Take 1,000 mg by mouth in the morning.     diclofenac Sodium (VOLTAREN) 1 % GEL Apply 2 g topically 4 (four) times daily.     Elastic Bandages & Supports (THUMB BRACE) MISC 1 each by Does not apply route daily. Left thumb spica 1 each 0   FISH OIL-BORAGE-FLAX-SAFFLOWER PO Take 1 capsule by mouth in the morning.     letrozole (FEMARA) 2.5 MG tablet  Take 1 tablet (2.5 mg total) by mouth daily. 30 tablet 2   loratadine (CLARITIN) 10 MG tablet Take 1 tablet (10 mg total) by mouth daily. 90 tablet 1   metoprolol succinate (TOPROL-XL) 25 MG 24 hr tablet TAKE 1 TABLET(25 MG) BY MOUTH DAILY 90 tablet 0   Multiple Vitamin (MULTIVITAMIN) capsule Take 1 capsule by mouth daily.     rosuvastatin (CRESTOR) 5 MG tablet Take 1 tablet (5 mg total) by mouth every Monday, Wednesday, and Friday. 36 tablet 3   triamterene-hydrochlorothiazide (MAXZIDE-25) 37.5-25 MG tablet Take 1 tablet by mouth daily. 90 tablet 1   No current facility-administered medications on file prior to visit.    Allergies  Allergen Reactions   Altace [Ramipril] Swelling and Other (See Comments)    lips   Social History   Occupational History   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  Tobacco Use   Smoking status: Former    Current packs/day: 0.00    Average packs/day: 0.3 packs/day for 2.0 years (0.5 ttl pk-yrs)    Types: Cigarettes    Start date: 20    Quit date: 31    Years since quitting: 51.2   Smokeless tobacco: Never   Tobacco comments:    smoking cessation materials not required  Vaping Use   Vaping status: Never Used  Substance and Sexual Activity   Alcohol use: No    Alcohol/week: 0.0 standard drinks of alcohol   Drug use: No   Sexual activity: Not Currently    Comment: Husband has prostate issues   Family History  Problem Relation Age of Onset   Stroke Mother    Arthritis Mother    Breast cancer Sister 44   Breast cancer Sister    Cancer Brother 70       kidney   Kidney cancer Brother    Cancer Maternal Grandmother        Gallbladder   Stroke Paternal Grandmother    Immunization History  Administered Date(s) Administered   Fluad Quad(high Dose 65+) 02/23/2019, 01/22/2020, 01/02/2021, 10/26/2021   Fluad Trivalent(High Dose 65+) 11/29/2022   Influenza, High Dose Seasonal PF 10/07/2014, 10/10/2015, 10/11/2016, 10/11/2017    Influenza-Unspecified 03/20/2012, 10/01/2013   Janssen (J&J) SARS-COV-2 Vaccination 03/31/2019   Moderna Sars-Covid-2 Vaccination 03/31/2020   Pneumococcal Conjugate-13 10/07/2014   Pneumococcal Polysaccharide-23 07/27/2009, 10/10/2015   Tdap 07/16/2006   Zoster Recombinant(Shingrix) 02/21/2021, 04/26/2021   Zoster, Live 10/19/2009     Review of Systems: Negative except as noted in the HPI.   Objective: There were no vitals filed for this visit.  Stacy [name] is a pleasant 78 y.o. female in NAD. AAO X 3.   Diabetic foot exam was performed with the following findings:   Vascular Examination: Capillary refill time  immediate b/l. Vascular status intact b/l with palpable pedal pulses. Pedal hair diminished b/l. No pain with calf compression b/l. Skin temperature gradient WNL b/l. No cyanosis or clubbing b/l. No ischemia or gangrene noted b/l. +1 pitting edema noted BLE. Varicosities present b/l.  Neurological Examination: Sensation grossly intact b/l with 10 gram monofilament. Vibratory sensation intact b/l.   Dermatological Examination: Pedal skin with normal turgor, texture and tone b/l.  No open wounds. No interdigital macerations.   Toenails 1-5 b/l thick, discolored, elongated with subungual debris and pain on dorsal palpation.   Incurvated nailplate medial border left hallux.  Nail border hypertrophy minimal. There is tenderness to palpation. Sign(s) of infection: no clinical signs of infection noted on examination today..  Musculoskeletal Examination: Muscle strength 5/5 to all lower extremity muscle groups bilaterally. No pain, crepitus or joint limitation noted with ROM bilateral LE. No gross bony deformities bilaterally. Utilizes cane for ambulation assistance.  Radiographs: None     Lab Results  Component Value Date   HGBA1C 6.1 (A) 11/29/2022   ADA Risk Categorization: Low Risk :  Patient has all of the following: Intact protective sensation No prior foot  ulcer  No severe deformity Pedal pulses present  Assessment: 1. Pain due to onychomycosis of toenails of both feet   2. Ingrown toenail without infection   3. Bilateral lower extremity edema   4. Diabetes mellitus type 2, diet-controlled (HCC)   5. Encounter for diabetic foot exam (HCC)     Plan: Diabetic foot examination performed today. All patient's and/or POA's questions/concerns addressed on today's visit. Toenails 2-5 bilaterally and R hallux debrided in length and girth without incident. Monitor blood glucose per PCP/Endocrinologist's recommendations.Continue soft, supportive shoe gear daily. Report any pedal injuries to medical professional. Call office if there are any questions/concerns. -No invasive procedure(s) performed. Offending nail border debrided and curretaged medial border left hallux utilizing sterile nail nipper and currette. Border(s) cleansed with alcohol and triple antibiotic ointment applied. Dispensed written instructions for once daily epsom salt soaks for 1 days. Call office if there are any concerns. -Patient/POA to call should there be question/concern in the interim. Return in about 3 months (around 06/18/2023).  Freddie Breech, DPM      [place] LOCATION: [address], [place] [address]                   Office [phone]   [facility] LOCATION: [address] [place], [place] [address] Office [phone]

## 2023-03-26 ENCOUNTER — Other Ambulatory Visit: Payer: Self-pay | Admitting: Family Medicine

## 2023-03-29 ENCOUNTER — Ambulatory Visit: Payer: Medicare PPO | Admitting: Family Medicine

## 2023-03-29 ENCOUNTER — Encounter: Payer: Self-pay | Admitting: Family Medicine

## 2023-03-29 ENCOUNTER — Other Ambulatory Visit: Payer: Self-pay

## 2023-03-29 VITALS — BP 156/78 | HR 91 | Resp 16 | Ht 67.0 in | Wt 201.3 lb

## 2023-03-29 DIAGNOSIS — M1712 Unilateral primary osteoarthritis, left knee: Secondary | ICD-10-CM | POA: Diagnosis not present

## 2023-03-29 DIAGNOSIS — I152 Hypertension secondary to endocrine disorders: Secondary | ICD-10-CM

## 2023-03-29 DIAGNOSIS — E559 Vitamin D deficiency, unspecified: Secondary | ICD-10-CM | POA: Diagnosis not present

## 2023-03-29 DIAGNOSIS — E1159 Type 2 diabetes mellitus with other circulatory complications: Secondary | ICD-10-CM | POA: Diagnosis not present

## 2023-03-29 DIAGNOSIS — E1169 Type 2 diabetes mellitus with other specified complication: Secondary | ICD-10-CM

## 2023-03-29 DIAGNOSIS — I10 Essential (primary) hypertension: Secondary | ICD-10-CM

## 2023-03-29 DIAGNOSIS — G4701 Insomnia due to medical condition: Secondary | ICD-10-CM

## 2023-03-29 DIAGNOSIS — E785 Hyperlipidemia, unspecified: Secondary | ICD-10-CM | POA: Diagnosis not present

## 2023-03-29 DIAGNOSIS — M654 Radial styloid tenosynovitis [de Quervain]: Secondary | ICD-10-CM

## 2023-03-29 DIAGNOSIS — Z9889 Other specified postprocedural states: Secondary | ICD-10-CM

## 2023-03-29 LAB — POCT GLYCOSYLATED HEMOGLOBIN (HGB A1C): Hemoglobin A1C: 6 % — AB (ref 4.0–5.6)

## 2023-03-29 MED ORDER — METOPROLOL SUCCINATE ER 50 MG PO TB24: 50.0000 mg | ORAL_TABLET | Freq: Every day | ORAL | 0 refills | Status: DC

## 2023-03-29 MED ORDER — TRAZODONE HCL 50 MG PO TABS: 25.0000 mg | ORAL_TABLET | Freq: Every evening | ORAL | 0 refills | Status: AC | PRN

## 2023-03-29 NOTE — Progress Notes (Signed)
 Name: Stacy [name]   MRN: [id number]    DOB: 07/12/1945   Date:03/29/2023       Progress Note  Subjective  Chief Complaint  Chief Complaint  Patient presents with   Medical Management of Chronic Issues   HPI   Invasive carcinoma of the left breast: mastectomy is scheduled for 01/31/2022 with Dr. Harl Bowie, she is under the care of Dr. Cathie Hoops. She is currently taking Femara ( Letrozole ) she could not tolerate Arimidex due to increase in hot flashes and arthralgias.    DMII: doing well, on diet only, can't take ACE caused angioedema, last urine micro negative   Denies polyphagia, polydipsia or polyuria.  She is following a diabetic diet. She has been compliant with Crestor 5 mg M, W and Fridays , last LDL was at goal at 41 , A1C was 6.1 %, discussed life style modification    HTN: taking triamterene hctz, BP today was high twice in our office and also at recent hematologist visit.    Denies chest pain, dizziness, palpitation, orthopnea  but has intermittent mild SOB.  She has chronic lower extremity swelling , she states better with compression stocking hoses. We added  low dose beta blockers last visit and bp improved , however she is in pain and bp has been elevated past couple visits with ortho and also here in the office twice today, we will adjust dose to 50 mg. Cannot take ACE due to angioedema and norvasc causes edema   Vitamin D deficiency: continue supplementation, we will recheck labs   DeQuervain's tendinophaty: she has surgery on right and is doing better, still has pain on left side and will go for surgery in April.  Insomnia: she has not been sleeping well, she wakes up to void and cannot fall back asleep. She is also in pain, we will try Trazodone   Patient Active Problem List   Diagnosis Date Noted   Aromatase inhibitor use 05/23/2022   Arthralgia 05/23/2022   Genetic testing 02/05/2022   Breast cancer (HCC) 01/31/2022   Invasive carcinoma of breast (HCC) 01/16/2022    Goals of care, counseling/discussion 01/16/2022   S/P TKR (total knee replacement) using cement, left 06/08/2021   Hyperlipidemia associated with type 2 diabetes mellitus (HCC) 01/14/2020   Onychomycosis of multiple toenails with type 2 diabetes mellitus (HCC) 01/23/2019   Pain due to onychomycosis of toenails of both feet 10/06/2018   Abnormal EKG 12/10/2016   Radial scar of breast 05/30/2015   History of foot surgery 12/15/2014   Hypertension, benign 10/07/2014   Seasonal allergies 10/07/2014   Diabetes mellitus type 2, diet-controlled (HCC) 10/07/2014   Vitamin D deficiency 10/07/2014   Varicose veins 10/07/2014   Family history of breast cancer 05/12/2012    Past Surgical History:  Procedure Laterality Date   BREAST BIOPSY Right 2015   3 stereo biopsies. 2 benign. one was radial scar, no excision done for radial scar   BREAST BIOPSY Left 07/16/2017   BIPHASIC STROMAL AND EPITHELIAL LESION./ Dr Lemar Livings   BREAST BIOPSY Left 08/23/2020   [place] bx of mass 5:00 2cmfn, coil marker, FIBROEPITHELIAL LESION WITH INTRACANALICULAR GROWTH PATTERN AND VARIABLE STROMAL HYPERCELLULARITY   BREAST BIOPSY Left 01/02/2022   [place] bx path pending 10:00 ribbon clip   BREAST BIOPSY Left 01/02/2022   [place] LT BREAST BX W LOC DEV 1ST LESION IMG BX SPEC US GUIDE 01/02/2022 [facility]   BREAST EXCISIONAL BIOPSY Left 1968   x 3 per pt  BREAST EXCISIONAL BIOPSY Right 2007   benign   BUNIONECTOMY Left    COLONOSCOPY  2011   DR,ISHAKIS   EXCISION OF BREAST LESION Left 10/07/2020   Procedure: EXCISION OF BREAST LESION;  Surgeon: Earline Mayotte, MD;  Location: [facility];  Service: General;  Laterality: Left; FIBROEPITHELIAL LESION WITH NTRACANALICULAR GROWTH PATTERN AND VARIABLE STROMAL   HAMMER TOE SURGERY Left    2nd toe   HEEL SPUR SURGERY Left 12/15/2014   Dr. Loreta Ave at [facility]   HEEL SPUR SURGERY Right    MASTECTOMY W/ SENTINEL NODE BIOPSY Left 01/31/2022   Procedure: MASTECTOMY WITH  SENTINEL LYMPH NODE BIOPSY;  Surgeon: Carolan Shiver, MD;  Location: [facility];  Service: General;  Laterality: Left;   TOTAL KNEE ARTHROPLASTY Left 06/08/2021   Procedure: TOTAL KNEE ARTHROPLASTY;  Surgeon: Kennedy Bucker, MD;  Location: [facility];  Service: Orthopedics;  Laterality: Left;    Family History  Problem Relation Age of Onset   Stroke Mother    Arthritis Mother    Breast cancer Sister 60   Breast cancer Sister    Cancer Brother 69       kidney   Kidney cancer Brother    Cancer Maternal Grandmother        Gallbladder   Stroke Paternal Grandmother     Social History   Tobacco Use   Smoking status: Former    Current packs/day: 0.00    Average packs/day: 0.3 packs/day for 2.0 years (0.5 ttl pk-yrs)    Types: Cigarettes    Start date: 46    Quit date: 23    Years since quitting: 51.2   Smokeless tobacco: Never   Tobacco comments:    smoking cessation materials not required  Substance Use Topics   Alcohol use: No    Alcohol/week: 0.0 standard drinks of alcohol     Current Outpatient Medications:    acetaminophen (TYLENOL) 500 MG tablet, Take 500-1,000 mg by mouth every 6 (six) hours as needed (pain.)., Disp: , Rfl:    aspirin 81 MG EC tablet, Take 81 mg by mouth in the morning., Disp: , Rfl:    celecoxib (CELEBREX) 100 MG capsule, Take 1 capsule (100 mg total) by mouth 2 (two) times daily., Disp: 60 capsule, Rfl: 0   Cholecalciferol (VITAMIN D-3 PO), Take 2,000 Units by mouth in the morning., Disp: , Rfl:    CINNAMON PO, Take 1,000 mg by mouth in the morning., Disp: , Rfl:    diclofenac Sodium (VOLTAREN) 1 % GEL, Apply 2 g topically 4 (four) times daily., Disp: , Rfl:    Elastic Bandages & Supports (THUMB BRACE) MISC, 1 each by Does not apply route daily. Left thumb spica, Disp: 1 each, Rfl: 0   FISH OIL-BORAGE-FLAX-SAFFLOWER PO, Take 1 capsule by mouth in the morning., Disp: , Rfl:    letrozole (FEMARA) 2.5 MG tablet, Take 1 tablet (2.5 mg total) by  mouth daily., Disp: 30 tablet, Rfl: 2   loratadine (CLARITIN) 10 MG tablet, Take 1 tablet (10 mg total) by mouth daily., Disp: 90 tablet, Rfl: 1   metoprolol succinate (TOPROL-XL) 25 MG 24 hr tablet, TAKE 1 TABLET(25 MG) BY MOUTH DAILY, Disp: 90 tablet, Rfl: 0   Multiple Vitamin (MULTIVITAMIN) capsule, Take 1 capsule by mouth daily., Disp: , Rfl:    rosuvastatin (CRESTOR) 5 MG tablet, Take 1 tablet (5 mg total) by mouth every Monday, Wednesday, and Friday., Disp: 36 tablet, Rfl: 3   triamterene-hydrochlorothiazide (MAXZIDE-25) 37.5-25 MG  tablet, Take 1 tablet by mouth daily., Disp: 90 tablet, Rfl: 1  Allergies  Allergen Reactions   Altace [Ramipril] Swelling and Other (See Comments)    lips    I personally reviewed active problem list, medication list, allergies with the patient/caregiver today.   ROS  Ten systems reviewed and is negative except as mentioned in HPI    Objective  Vitals:   03/29/23 1133  BP: (!) 162/84  Pulse: 91  Resp: 16  SpO2: 100%  Weight: 201 lb 4.8 oz (91.3 kg)  Height: 5\' 7"  (1.702 m)    Body mass index is 31.53 kg/m.  Physical Exam  Constitutional: Patient appears well-developed and well-nourished. Obese  No distress.  HEENT: head atraumatic, normocephalic, pupils equal and reactive to light, neck supple Cardiovascular: Normal rate, regular rhythm and normal heart sounds.  No murmur heard. No BLE edema. Pulmonary/Chest: Effort normal and breath sounds normal. No respiratory distress. Abdominal: Soft.  There is no tenderness. Psychiatric: Patient has a normal mood and affect. behavior is normal. Judgment and thought content normal.   Recent Results (from the past 2160 hours)  POCT glycosylated hemoglobin (Hb A1C)     Status: Abnormal   Collection Time: 03/29/23 11:36 AM  Result Value Ref Range   Hemoglobin A1C 6.0 (A) 4.0 - 5.6 %   HbA1c POC (<> result, manual entry)     HbA1c, POC (prediabetic range)     HbA1c, POC (controlled diabetic range)       Diabetic Foot Exam:     PHQ2/9:    03/29/2023   11:22 AM 11/29/2022    8:41 AM 05/28/2022    9:52 AM 01/30/2022   11:01 AM 01/25/2022    9:12 AM  Depression screen PHQ 2/9  Decreased Interest 0 0 0 0 0  Down, Depressed, Hopeless 0 0 0 0 0  PHQ - 2 Score 0 0 0 0 0  Altered sleeping 0 0 0 0 0  Tired, decreased energy 0 0 0 0 0  Change in appetite 0 0 0 0 0  Feeling bad or failure about yourself  0 0 0 0 0  Trouble concentrating 0 0 0 0 0  Moving slowly or fidgety/restless 0 0 0 0 0  Suicidal thoughts 0 0 0 0 0  PHQ-9 Score 0 0 0 0 0  Difficult doing work/chores Not difficult at all   Not difficult at all     phq 9 is negative  Fall Risk:    03/29/2023   11:22 AM 11/29/2022    8:41 AM 05/28/2022    9:52 AM 01/30/2022   11:01 AM 01/25/2022    9:12 AM  Fall Risk   Falls in the past year? 0 0 0 0 0  Number falls in past yr: 0  0 0   Injury with Fall?   0 0   Risk for fall due to : No Fall Risks Impaired balance/gait Impaired balance/gait;Impaired mobility No Fall Risks No Fall Risks  Follow up Falls prevention discussed;Education provided;Falls evaluation completed Falls prevention discussed;Education provided;Falls evaluation completed Falls prevention discussed Falls prevention discussed;Education provided;Falls evaluation completed Falls prevention discussed;Education provided;Falls evaluation completed     Assessment & Plan  1. Hypertension associated with type 2 diabetes mellitus (HCC) (Primary)  - metoprolol succinate (TOPROL-XL) 50 MG 24 hr tablet; Take 1 tablet (50 mg total) by mouth daily.  Dispense: 90 tablet; Refill: 0  2. Dyslipidemia associated with type 2 diabetes mellitus (HCC)  - POCT glycosylated  hemoglobin (Hb A1C)  3. Vitamin D deficiency  Continue supplementation  4. Primary osteoarthritis of left knee  Continue topical medication, off celebrex  5. De Quervain's tenosynovitis, left  Going to have surgery  6. History of surgery on right  wrist  Doing well  7. Hypertension, benign  We will adjust dose of metoprolol to 50 mg daily   8. Insomnia due to medical condition  - traZODone (DESYREL) 50 MG tablet; Take 0.5-1 tablets (25-50 mg total) by mouth at bedtime as needed for sleep.  Dispense: 90 tablet; Refill: 0

## 2023-04-04 ENCOUNTER — Telehealth: Payer: Self-pay | Admitting: Family Medicine

## 2023-04-04 NOTE — Telephone Encounter (Signed)
 Pt was told 2 separate prescriptions were written and unfortunately it was too late to cancel the mail pharmacy one. Pt was advised to wait for the mail, since she does not want to pay out of pocket for [facility], she will wait on the mail pharmacy and will come back for a nurse visit once she starts the increased dose.

## 2023-04-04 NOTE — Telephone Encounter (Signed)
 Copied from CRM [id number]. Topic: Appointments - Scheduling Inquiry for Clinic >> Apr 04, 2023  1:18 PM Marland Kitchen D wrote: Patient states she hasn't gotten her blood pressure medicine [facility] yet. She would like to know if she needs to reschedule her appointment that's on April 12, 2023 at 110:00 am it's for her blood pressure. Please contact the patient back regarding this.

## 2023-04-11 ENCOUNTER — Ambulatory Visit

## 2023-04-11 VITALS — BP 156/78 | Ht 67.0 in | Wt 200.0 lb

## 2023-04-11 DIAGNOSIS — Z0000 Encounter for general adult medical examination without abnormal findings: Secondary | ICD-10-CM | POA: Diagnosis not present

## 2023-04-11 NOTE — Progress Notes (Signed)
 Because this visit was a virtual/telehealth visit,  certain criteria was not obtained, such a blood pressure, CBG if applicable, and timed get up and go. Any medications not marked as "taking" were not mentioned during the medication reconciliation part of the visit. Any vitals not documented were not able to be obtained due to this being a telehealth visit or patient was unable to self-report a recent blood pressure reading due to a lack of equipment at home via telehealth. Vitals that have been documented are verbally provided by the patient.   Subjective:   Stacy [name] is a 78 y.o. who presents for a Medicare Wellness preventive visit.  Visit Complete: Virtual I connected with  Stacy [name] on 04/11/23 by a audio enabled telemedicine application and verified that I am speaking with the correct person using two identifiers.  Patient Location: Home  Provider Location: Home Office  I discussed the limitations of evaluation and management by telemedicine. The patient expressed understanding and agreed to proceed.  Vital Signs: Because this visit was a virtual/telehealth visit, some criteria may be missing or patient reported. Any vitals not documented were not able to be obtained and vitals that have been documented are patient reported.  VideoDeclined- This patient declined Librarian, academic. Therefore the visit was completed with audio only.  Persons Participating in Visit: Patient.  AWV Questionnaire: No: Patient Medicare AWV questionnaire was not completed prior to this visit.  Cardiac Risk Factors include: advanced age (>61men, >1 women);dyslipidemia;hypertension;obesity (BMI >30kg/m2);diabetes mellitus     Objective:    Today's Vitals   04/11/23 0848  BP: (!) 156/78  Weight: 200 lb (90.7 kg)  Height: 5\' 7"  (1.702 m)   Body mass index is 31.32 kg/m.     04/11/2023    8:47 AM 02/21/2022    9:52 AM 01/31/2022    8:48 AM 01/22/2022   11:23  AM 01/16/2022   10:53 AM 06/08/2021    2:14 PM 05/30/2021   10:20 AM  Advanced Directives  Does Patient Have a Medical Advance Directive? No No No No No No No  Would patient like information on creating a medical advance directive? No - Patient declined No - Patient declined No - Patient declined   No - Patient declined No - Patient declined    Current Medications (verified) Outpatient Encounter Medications as of 04/11/2023  Medication Sig   acetaminophen (TYLENOL) 500 MG tablet Take 500-1,000 mg by mouth every 6 (six) hours as needed (pain.).   aspirin 81 MG EC tablet Take 81 mg by mouth in the morning.   Cholecalciferol (VITAMIN D-3 PO) Take 2,000 Units by mouth in the morning.   CINNAMON PO Take 1,000 mg by mouth in the morning.   diclofenac Sodium (VOLTAREN) 1 % GEL Apply 2 g topically 4 (four) times daily.   Elastic Bandages & Supports (THUMB BRACE) MISC 1 each by Does not apply route daily. Left thumb spica   FISH OIL-BORAGE-FLAX-SAFFLOWER PO Take 1 capsule by mouth in the morning.   letrozole (FEMARA) 2.5 MG tablet Take 1 tablet (2.5 mg total) by mouth daily.   loratadine (CLARITIN) 10 MG tablet Take 1 tablet (10 mg total) by mouth daily.   metoprolol succinate (TOPROL-XL) 50 MG 24 hr tablet Take 1 tablet (50 mg total) by mouth daily.   Multiple Vitamin (MULTIVITAMIN) capsule Take 1 capsule by mouth daily.   rosuvastatin (CRESTOR) 5 MG tablet Take 1 tablet (5 mg total) by mouth every Monday, Wednesday, and  Friday.   traZODone (DESYREL) 50 MG tablet Take 0.5-1 tablets (25-50 mg total) by mouth at bedtime as needed for sleep.   triamterene-hydrochlorothiazide (MAXZIDE-25) 37.5-25 MG tablet Take 1 tablet by mouth daily.   No facility-administered encounter medications on file as of 04/11/2023.    Allergies (verified) Altace [ramipril]   History: Past Medical History:  Diagnosis Date   Allergy    Arthritis    Cancer (HCC)    GERD (gastroesophageal reflux disease)     Hyperlipidemia    Hypertension    Neoplasm of uncertain behavior of breast 2007   Pre-diabetes    Vertigo    Past Surgical History:  Procedure Laterality Date   BREAST BIOPSY Right 2015   3 stereo biopsies. 2 benign. one was radial scar, no excision done for radial scar   BREAST BIOPSY Left 07/16/2017   BIPHASIC STROMAL AND EPITHELIAL LESION./ Dr Lemar Livings   BREAST BIOPSY Left 08/23/2020   [place] bx of mass 5:00 2cmfn, coil marker, FIBROEPITHELIAL LESION WITH INTRACANALICULAR GROWTH PATTERN AND VARIABLE STROMAL HYPERCELLULARITY   BREAST BIOPSY Left 01/02/2022   [place] bx path pending 10:00 ribbon clip   BREAST BIOPSY Left 01/02/2022   [place] LT BREAST BX W LOC DEV 1ST LESION IMG BX SPEC US GUIDE 01/02/2022 [facility]   BREAST EXCISIONAL BIOPSY Left 1968   x 3 per pt   BREAST EXCISIONAL BIOPSY Right 2007   benign   BUNIONECTOMY Left    COLONOSCOPY  2011   DR,ISHAKIS   EXCISION OF BREAST LESION Left 10/07/2020   Procedure: EXCISION OF BREAST LESION;  Surgeon: Earline Mayotte, MD;  Location: [facility];  Service: General;  Laterality: Left; FIBROEPITHELIAL LESION WITH NTRACANALICULAR GROWTH PATTERN AND VARIABLE STROMAL   HAMMER TOE SURGERY Left    2nd toe   HEEL SPUR SURGERY Left 12/15/2014   Dr. Loreta Ave at [facility]   HEEL SPUR SURGERY Right    MASTECTOMY W/ SENTINEL NODE BIOPSY Left 01/31/2022   Procedure: MASTECTOMY WITH SENTINEL LYMPH NODE BIOPSY;  Surgeon: Carolan Shiver, MD;  Location: [facility];  Service: General;  Laterality: Left;   TOTAL KNEE ARTHROPLASTY Left 06/08/2021   Procedure: TOTAL KNEE ARTHROPLASTY;  Surgeon: Kennedy Bucker, MD;  Location: [facility];  Service: Orthopedics;  Laterality: Left;   Family History  Problem Relation Age of Onset   Stroke Mother    Arthritis Mother    Breast cancer Sister 24   Breast cancer Sister    Cancer Brother 47       kidney   Kidney cancer Brother    Cancer Maternal Grandmother        Gallbladder   Stroke Paternal  Grandmother    Social History   Socioeconomic History   Marital status: Married    Spouse name: Richard   Number of children: 1   Years of education: Not on file   Highest education level: 12th grade  Occupational History   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  Tobacco Use   Smoking status: Former    Current packs/day: 0.00    Average packs/day: 0.3 packs/day for 2.0 years (0.5 ttl pk-yrs)    Types: Cigarettes    Start date: 62    Quit date: 1974    Years since quitting: 51.2   Smokeless tobacco: Never   Tobacco comments:    smoking cessation materials not required  Vaping Use   Vaping status: Never Used  Substance and Sexual Activity  Alcohol use: No    Alcohol/week: 0.0 standard drinks of alcohol   Drug use: No   Sexual activity: Not Currently    Comment: Husband has prostate issues  Other Topics Concern   Not on file  Social History Narrative   Not on file   Social Drivers of Health   Financial Resource Strain: Low Risk  (04/11/2023)   Overall Financial Resource Strain (CARDIA)    Difficulty of Paying Living Expenses: Not hard at all  Food Insecurity: No Food Insecurity (04/11/2023)   Hunger Vital Sign    Worried About Running Out of Food in the Last Year: Never true    Ran Out of Food in the Last Year: Never true  Transportation Needs: No Transportation Needs (04/11/2023)   PRAPARE - Administrator, Civil Service (Medical): No    Lack of Transportation (Non-Medical): No  Physical Activity: Insufficiently Active (04/11/2023)   Exercise Vital Sign    Days of Exercise per Week: 1 day    Minutes of Exercise per Session: 10 min  Stress: No Stress Concern Present (04/11/2023)   [employer] of Occupational Health - Occupational Stress Questionnaire    Feeling of Stress : Not at all  Social Connections: Socially Integrated (04/11/2023)   Social Connection and Isolation Panel [NHANES]    Frequency of Communication with Friends and Family: More  than three times a week    Frequency of Social Gatherings with Friends and Family: Once a week    Attends Religious Services: More than 4 times per year    Active Member of [employer] or Organizations: Yes    Attends Banker Meetings: 1 to 4 times per year    Marital Status: Married    Tobacco Counseling Counseling given: Not Answered Tobacco comments: smoking cessation materials not required    Clinical Intake:  Pre-visit preparation completed: Yes  Pain : No/denies pain     BMI - recorded: 31.32 Nutritional Status: BMI > 30  Obese Nutritional Risks: None Diabetes: Yes CBG done?: No Did pt. bring in CBG monitor from home?: No  Lab Results  Component Value Date   HGBA1C 6.0 (A) 03/29/2023   HGBA1C 6.1 (A) 11/29/2022   HGBA1C 5.7 (A) 05/28/2022        Interpreter Needed?: No  Information entered by :: [employer]   Activities of Daily Living     04/11/2023    8:57 AM 11/29/2022    8:42 AM  In your present state of health, do you have any difficulty performing the following activities:  Hearing? 0 0  Vision? 0 0  Difficulty concentrating or making decisions? 0 0  Walking or climbing stairs? 0 1  Dressing or bathing? 0 0  Doing errands, shopping? 0 0  Preparing Food and eating ? N   Using the Toilet? N   In the past six months, have you accidently leaked urine? N   Do you have problems with loss of bowel control? N   Managing your Medications? N   Managing your Finances? N   Housekeeping or managing your Housekeeping? N     Patient Care Team: Alba Cory, MD as PCP - General (Family Medicine) Domingo Madeira, OD as Consulting Physician (Optometry) Byrnett, Merrily Pew, MD as Consulting Physician (General Surgery) Helane Gunther, DPM as Consulting Physician (Podiatry) Hulen Luster, RN as Oncology Nurse Navigator Carolan Shiver, MD as Consulting Physician (General Surgery) [name], MD as Consulting Physician  (Oncology)  Indicate any  recent Medical Services you may have received from other than [facility] providers in the past year (date may be approximate).     Assessment:   This is a routine wellness examination for Stacy [name].  Hearing/Vision screen Hearing Screening - Comments:: No problems with hearing Vision Screening - Comments:: Patient wears glasses.  [facility] - [address] Dr Larence Penning   Goals Addressed             This Visit's Progress    Exercise 3x per week (20-30 min per time)   On track    Recommend to exercise 3x per week for 20-30 min per time.       Depression Screen     04/11/2023    8:57 AM 03/29/2023   11:22 AM 11/29/2022    8:41 AM 05/28/2022    9:52 AM 01/30/2022   11:01 AM 01/25/2022    9:12 AM 09/14/2021   10:49 AM  PHQ 2/9 Scores  PHQ - 2 Score 0 0 0 0 0 0 0  PHQ- 9 Score 0 0 0 0 0 0 0    Fall Risk     04/11/2023    8:54 AM 03/29/2023   11:22 AM 11/29/2022    8:41 AM 05/28/2022    9:52 AM 01/30/2022   11:01 AM  Fall Risk   Falls in the past year? 0 0 0 0 0  Number falls in past yr: 0 0  0 0  Injury with Fall? 0   0 0  Risk for fall due to : No Fall Risks No Fall Risks Impaired balance/gait Impaired balance/gait;Impaired mobility No Fall Risks  Follow up Falls evaluation completed Falls prevention discussed;Education provided;Falls evaluation completed Falls prevention discussed;Education provided;Falls evaluation completed Falls prevention discussed Falls prevention discussed;Education provided;Falls evaluation completed    MEDICARE RISK AT HOME:  Medicare Risk at Home Any stairs in or around the home?: Yes If so, are there any without handrails?: No Home free of loose throw rugs in walkways, pet beds, electrical cords, etc?: Yes Adequate lighting in your home to reduce risk of falls?: Yes Life alert?: No Use of a cane, walker or w/c?: Yes (cane when leaving the house) Grab bars in the bathroom?: Yes Shower chair or bench in shower?: Yes Elevated toilet  seat or a handicapped toilet?: Yes  TIMED UP AND GO:  Was the test performed?  No  Cognitive Function: 6CIT completed        04/11/2023    8:51 AM 01/30/2022   11:29 AM 10/15/2019    9:41 AM 10/14/2018    9:41 AM 10/11/2017    9:22 AM  6CIT Screen  What Year? 0 points 0 points 0 points 0 points 0 points  What month? 0 points 0 points 0 points 0 points 0 points  What time? 0 points 0 points 0 points 0 points 0 points  Count back from 20 0 points 0 points 0 points 0 points 0 points  Months in reverse 0 points 0 points 0 points 0 points 0 points  Repeat phrase 0 points 2 points 0 points 2 points 0 points  Total Score 0 points 2 points 0 points 2 points 0 points    Immunizations Immunization History  Administered Date(s) Administered   Fluad Quad(high Dose 65+) 02/23/2019, 01/22/2020, 01/02/2021, 10/26/2021   Fluad Trivalent(High Dose 65+) 11/29/2022   Influenza, High Dose Seasonal PF 10/07/2014, 10/10/2015, 10/11/2016, 10/11/2017   Influenza-Unspecified 03/20/2012, 10/01/2013   Janssen (J&J) SARS-COV-2 Vaccination 03/31/2019   Moderna  Sars-Covid-2 Vaccination 03/31/2020   Pneumococcal Conjugate-13 10/07/2014   Pneumococcal Polysaccharide-23 07/27/2009, 10/10/2015   Tdap 07/16/2006   Zoster Recombinant(Shingrix) 02/21/2021, 04/26/2021   Zoster, Live 10/19/2009    Screening Tests Health Maintenance  Topic Date Due   OPHTHALMOLOGY EXAM  08/25/2022   COVID-19 Vaccine (3 - 2024-25 season) 04/14/2023 (Originally 09/16/2022)   HEMOGLOBIN A1C  09/29/2023   Diabetic kidney evaluation - eGFR measurement  11/26/2023   MAMMOGRAM  11/26/2023   Diabetic kidney evaluation - Urine ACR  11/29/2023   FOOT EXAM  03/17/2024   Medicare Annual Wellness (AWV)  04/10/2024   Pneumonia Vaccine 38+ Years old  Completed   INFLUENZA VACCINE  Completed   DEXA SCAN  Completed   Hepatitis C Screening  Completed   Zoster Vaccines- Shingrix  Completed   HPV VACCINES  Aged Out   DTaP/Tdap/Td   Discontinued   Colonoscopy  Discontinued   Fecal DNA (Cologuard)  Discontinued    Health Maintenance  Health Maintenance Due  Topic Date Due   OPHTHALMOLOGY EXAM  08/25/2022   Health Maintenance Items Addressed:Record request has been sent to Ophthalmology for recent Diabetic eye exam.   Additional Screening:  Vision Screening: Recommended annual ophthalmology exams for early detection of glaucoma and other disorders of the eye.  Dental Screening: Recommended annual dental exams for proper oral hygiene  Community Resource Referral / Chronic Care Management: CRR required this visit?  No   CCM required this visit?  No     Plan:     I have personally reviewed and noted the following in the patient's chart:   Medical and social history Use of alcohol, tobacco or illicit drugs  Current medications and supplements including opioid prescriptions. Patient is not currently taking opioid prescriptions. Functional ability and status Nutritional status Physical activity Advanced directives List of other physicians Hospitalizations, surgeries, and ER visits in previous 12 months Vitals Screenings to include cognitive, depression, and falls Referrals and appointments  In addition, I have reviewed and discussed with patient certain preventive protocols, quality metrics, and best practice recommendations. A written personalized care plan for preventive services as well as general preventive health recommendations were provided to patient.     Rudi Heap, [place]   04/11/2023   After Visit Summary: (Pick Up) Due to this being a telephonic visit, with patients personalized plan was offered to patient and patient has requested to Pick up at office. Patient does not have mychart.  Notes: Nothing significant to report at this time.

## 2023-04-11 NOTE — Patient Instructions (Signed)
 Stacy [name] , Thank you for taking time to come for your Medicare Wellness Visit. I appreciate your ongoing commitment to your health goals. Please review the following plan we discussed and let me know if I can assist you in the future.   Referrals/Orders/Follow-Ups/Clinician Recommendations: follow up next year on 04/16/2024 at 9:30 for medicare annual wellness.  This is a list of the screening recommended for you and due dates:  Health Maintenance  Topic Date Due   Eye exam for diabetics  08/25/2022   COVID-19 Vaccine (3 - 2024-25 season) 04/14/2023*   Hemoglobin A1C  09/29/2023   Yearly kidney function blood test for diabetes  11/26/2023   Mammogram  11/26/2023   Yearly kidney health urinalysis for diabetes  11/29/2023   Complete foot exam   03/17/2024   Medicare Annual Wellness Visit  04/10/2024   Pneumonia Vaccine  Completed   Flu Shot  Completed   DEXA scan (bone density measurement)  Completed   Hepatitis C Screening  Completed   Zoster (Shingles) Vaccine  Completed   HPV Vaccine  Aged Out   DTaP/Tdap/Td vaccine  Discontinued   Colon Cancer Screening  Discontinued   Cologuard (Stool DNA test)  Discontinued  *Topic was postponed. The date shown is not the original due date.    Advanced directives: (Declined) Advance directive discussed with you today. Even though you declined this today, please call our office should you change your mind, and we can give you the proper paperwork for you to fill out.  Next Medicare Annual Wellness Visit scheduled for next year: Yes

## 2023-04-12 ENCOUNTER — Ambulatory Visit

## 2023-04-19 ENCOUNTER — Ambulatory Visit

## 2023-04-19 ENCOUNTER — Telehealth: Payer: Self-pay

## 2023-04-19 VITALS — BP 144/80

## 2023-04-19 DIAGNOSIS — E1159 Type 2 diabetes mellitus with other circulatory complications: Secondary | ICD-10-CM

## 2023-04-19 NOTE — Telephone Encounter (Signed)
 Copied from CRM [id number]. Topic: General - Call Back - No Documentation >> Apr 19, 2023  2:14 PM Benay Spice S wrote: Reason for CRM: Patient returning call to [facility]. She will give pt callback

## 2023-04-19 NOTE — Progress Notes (Signed)
 Patient is in office today for a nurse visit for Blood Pressure Check. Patient blood pressure was 144/80, Patient No chest pain, No shortness of breath, No dyspnea on exertion, No orthopnea, No paroxysmal nocturnal dyspnea, No edema, No palpitations, No syncope   OV note: HTN: taking triamterene hctz, BP today was high twice in our office and also at recent hematologist visit.    Denies chest pain, dizziness, palpitation, orthopnea  but has intermittent mild SOB.  She has chronic lower extremity swelling , she states better with compression stocking hoses. We added  low dose beta blockers last visit and bp improved , however she is in pain and bp has been elevated past couple visits with ortho and also here in the office twice today, we will adjust dose to 50 mg. Cannot take ACE due to angioedema and norvasc causes edema

## 2023-04-19 NOTE — Progress Notes (Signed)
 3 attempts no answer left vm to call back.

## 2023-04-19 NOTE — Telephone Encounter (Signed)
 Spoke to patient about Nurse visit today. Per PCP: Continue current dose, monitor bp at home, we may need to adjust medication again if bp remains above 140   Pt verbalized understanding

## 2023-05-02 ENCOUNTER — Ambulatory Visit

## 2023-05-02 DIAGNOSIS — E1159 Type 2 diabetes mellitus with other circulatory complications: Secondary | ICD-10-CM

## 2023-05-02 NOTE — Progress Notes (Signed)
 Patient is in office today for a nurse visit for Blood Pressure Check. Patient blood pressure was 138/86, Patient No shortness of breath, No dyspnea on exertion, No orthopnea, No paroxysmal nocturnal dyspnea, No edema, No palpitations, No syncope   HTN: taking triamterene hctz, BP today was high twice in our office and also at recent hematologist visit.    Denies chest pain, dizziness, palpitation, orthopnea  but has intermittent mild SOB.  She has chronic lower extremity swelling , she states better with compression stocking hoses. We added  low dose beta blockers last visit and bp improved , however she is in pain and bp has been elevated past couple visits with ortho and also here in the office twice today, we will adjust dose to 50 mg. Cannot take ACE due to angioedema and norvasc causes edema

## 2023-05-29 ENCOUNTER — Inpatient Hospital Stay: Payer: Medicare PPO | Attending: Oncology

## 2023-05-29 ENCOUNTER — Encounter: Payer: Self-pay | Admitting: Oncology

## 2023-05-29 ENCOUNTER — Inpatient Hospital Stay (HOSPITAL_BASED_OUTPATIENT_CLINIC_OR_DEPARTMENT_OTHER): Payer: Medicare PPO | Admitting: Oncology

## 2023-05-29 VITALS — BP 164/78 | HR 66 | Temp 95.5°F | Resp 18 | Wt 203.3 lb

## 2023-05-29 DIAGNOSIS — Z87891 Personal history of nicotine dependence: Secondary | ICD-10-CM | POA: Diagnosis not present

## 2023-05-29 DIAGNOSIS — E785 Hyperlipidemia, unspecified: Secondary | ICD-10-CM | POA: Diagnosis not present

## 2023-05-29 DIAGNOSIS — M25562 Pain in left knee: Secondary | ICD-10-CM | POA: Diagnosis not present

## 2023-05-29 DIAGNOSIS — M2550 Pain in unspecified joint: Secondary | ICD-10-CM | POA: Insufficient documentation

## 2023-05-29 DIAGNOSIS — Z8051 Family history of malignant neoplasm of kidney: Secondary | ICD-10-CM | POA: Insufficient documentation

## 2023-05-29 DIAGNOSIS — Z79811 Long term (current) use of aromatase inhibitors: Secondary | ICD-10-CM | POA: Insufficient documentation

## 2023-05-29 DIAGNOSIS — Z9012 Acquired absence of left breast and nipple: Secondary | ICD-10-CM | POA: Diagnosis not present

## 2023-05-29 DIAGNOSIS — C50919 Malignant neoplasm of unspecified site of unspecified female breast: Secondary | ICD-10-CM

## 2023-05-29 DIAGNOSIS — Z8261 Family history of arthritis: Secondary | ICD-10-CM | POA: Insufficient documentation

## 2023-05-29 DIAGNOSIS — C50212 Malignant neoplasm of upper-inner quadrant of left female breast: Secondary | ICD-10-CM | POA: Insufficient documentation

## 2023-05-29 DIAGNOSIS — N6322 Unspecified lump in the left breast, upper inner quadrant: Secondary | ICD-10-CM | POA: Diagnosis not present

## 2023-05-29 DIAGNOSIS — Z803 Family history of malignant neoplasm of breast: Secondary | ICD-10-CM | POA: Insufficient documentation

## 2023-05-29 DIAGNOSIS — Z79899 Other long term (current) drug therapy: Secondary | ICD-10-CM | POA: Insufficient documentation

## 2023-05-29 DIAGNOSIS — M25572 Pain in left ankle and joints of left foot: Secondary | ICD-10-CM | POA: Diagnosis not present

## 2023-05-29 DIAGNOSIS — Z823 Family history of stroke: Secondary | ICD-10-CM | POA: Diagnosis not present

## 2023-05-29 DIAGNOSIS — I10 Essential (primary) hypertension: Secondary | ICD-10-CM | POA: Insufficient documentation

## 2023-05-29 DIAGNOSIS — R232 Flushing: Secondary | ICD-10-CM | POA: Insufficient documentation

## 2023-05-29 DIAGNOSIS — Z170 Estrogen receptor positive status [ER+]: Secondary | ICD-10-CM | POA: Diagnosis not present

## 2023-05-29 LAB — CMP (CANCER CENTER ONLY)
ALT: 23 U/L (ref 0–44)
AST: 23 U/L (ref 15–41)
Albumin: 4 g/dL (ref 3.5–5.0)
Alkaline Phosphatase: 51 U/L (ref 38–126)
Anion gap: 8 (ref 5–15)
BUN: 18 mg/dL (ref 8–23)
CO2: 27 mmol/L (ref 22–32)
Calcium: 9 mg/dL (ref 8.9–10.3)
Chloride: 102 mmol/L (ref 98–111)
Creatinine: 0.71 mg/dL (ref 0.44–1.00)
GFR, Estimated: >60 mL/min (ref >60)
Glucose, Bld: 126 mg/dL — ABNORMAL HIGH (ref 70–99)
Potassium: 4.1 mmol/L (ref 3.5–5.1)
Sodium: 137 mmol/L (ref 135–145)
Total Bilirubin: 0.7 mg/dL (ref 0.0–1.2)
Total Protein: 7.3 g/dL (ref 6.5–8.1)

## 2023-05-29 LAB — CBC WITH DIFFERENTIAL (CANCER CENTER ONLY)
Abs Immature Granulocytes: 0.01 10*3/uL (ref 0.00–0.07)
Basophils Absolute: 0 10*3/uL (ref 0.0–0.1)
Basophils Relative: 1 %
Eosinophils Absolute: 0.2 10*3/uL (ref 0.0–0.5)
Eosinophils Relative: 7 %
HCT: 38.5 % (ref 36.0–46.0)
Hemoglobin: 12 g/dL (ref 12.0–15.0)
Immature Granulocytes: 0 %
Lymphocytes Relative: 37 %
Lymphs Abs: 1.3 10*3/uL (ref 0.7–4.0)
MCH: 27.8 pg (ref 26.0–34.0)
MCHC: 31.2 g/dL (ref 30.0–36.0)
MCV: 89.1 fL (ref 80.0–100.0)
Monocytes Absolute: 0.4 10*3/uL (ref 0.1–1.0)
Monocytes Relative: 10 %
Neutro Abs: 1.6 10*3/uL — ABNORMAL LOW (ref 1.7–7.7)
Neutrophils Relative %: 45 %
Platelet Count: 207 10*3/uL (ref 150–400)
RBC: 4.32 MIL/uL (ref 3.87–5.11)
RDW: 14.1 % (ref 11.5–15.5)
WBC Count: 3.6 10*3/uL — ABNORMAL LOW (ref 4.0–10.5)
nRBC: 0 % (ref 0.0–0.2)

## 2023-05-29 MED ORDER — LETROZOLE 2.5 MG PO TABS
2.5000 mg | ORAL_TABLET | Freq: Every day | ORAL | 1 refills | Status: DC
Start: 2023-05-29 — End: 2023-06-06

## 2023-05-29 NOTE — Assessment & Plan Note (Signed)
 Nov 2024  DEXA -normal bone density.  Recommend calcium and vitamin D supplementation.

## 2023-05-29 NOTE — Progress Notes (Signed)
 Hematology/Oncology Progress note [phone] [phone]        CHIEF COMPLAINTS/PURPOSE OF CONSULTATION:  Left breast invasive carcinoma  ASSESSMENT & PLAN:   Cancer Staging  Invasive carcinoma of breast (HCC) Staging form: Breast, AJCC 8th Edition - Clinical: Stage IA (cT1c, cN0, cM0, G2, ER+, PR+, HER2-) - Signed by Timmy Forbes, MD on 01/16/2022 - Pathologic stage from 01/31/2022: Stage IA (pT1c, pN0, cM0, G2, ER+, PR+, HER2-, Oncotype DX score: 17) - Signed by Timmy Forbes, MD on 02/21/2022   Invasive carcinoma of breast (HCC) Left breast invasive carcinoma, ER+. PR+. HER2- s/p mastectomy,  pT1c pN0, Oncotype DX 17 Pathology was reviewed and discussed with patient.  No need for adjuvant chemotherapy  07/2020 Normal bone density  Labs are reviewed and discussed with patient. Continue Arimidex  1mg  daily. She has some difficulty tolerating due to arthralgia. We discussed about option of switching to Aromasin  which may have overlapping side effects profile, but maybe better tolerated especially regarding musculoskeletal side effects like joint pain and stiffness She would like to finish her current supply and also utilize pain medication proscribed by pcp first. She knows to call my office if she decided to switch in the future.  Annual right screening mammogram - Nov 2025    Orders Placed This Encounter  Procedures   MM 3D SCREENING MAMMOGRAM UNILATERAL RIGHT BREAST    Standing Status:   Future    Expected Date:   11/29/2023    Expiration Date:   05/28/2024    Reason for Exam (SYMPTOM  OR DIAGNOSIS REQUIRED):   history of breast cancer    Preferred imaging location?:   [facility]   CMP (Cancer Center only)    Standing Status:   Future    Expected Date:   11/29/2023    Expiration Date:   05/28/2024   CBC with Differential (Cancer Center Only)    Standing Status:   Future    Expected Date:   11/29/2023    Expiration Date:   05/28/2024   Follow-up 6 months   All questions were answered. The patient knows to call the clinic with any problems, questions or concerns.  Timmy Forbes, MD, PhD [facility] Hematology Oncology 05/29/2023    HISTORY OF PRESENTING ILLNESS:  Stacy [name] 78 y.o. [name] presents to establish care for Stage I left breast invasive carcinoma I have reviewed her chart and materials related to her cancer extensively  Summary of oncologic history is as follows: Oncology History  Invasive carcinoma of breast (HCC)  11/21/2021 Mammogram   Bilateral screening mammogram showed In the left breast, a possible mass warrants further evaluation. In the right breast, no findings suspicious for malignancy.   12/14/2021 Mammogram   Left unilateral diagnostic mammogram/ ultrasound showed Suspicious 1.2 cm mass over the 10 o'clock position of the left breast 4 cm from the nipple. No pathologically enlarged Left axilla lymph node    01/12/2022 Initial Diagnosis   Invasive carcinoma of breast   -Left breast lesion ultrasound-guided biopsy showed invasive mammary carcinoma with mucinous features, grade 2, DCIS negative, LVI negative ER 90%, PR 20%, HER2 IHC 2+ equivocal, FISH negative.  Menarche at age of [age] First live birth at age of 79 OCP use: 5 years Menopausal status: Postmenopausal, LMP in 94s. 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 No pathogenic variants identified on the [facility] Multi-Cancer+RNA panel. The report date is  02/04/2022.  The Multi-Cancer + RNA Panel offered by [facility] includes sequencing and/or deletion/duplication analysis of the following 70 genes:  AIP*, ALK, APC*, ATM*, AXIN2*, BAP1*, BARD1*, BLM*, BMPR1A*, BRCA1*, BRCA2*, BRIP1*, CDC73*, CDH1*, CDK4, CDKN1B*, CDKN2A, CHEK2*, CTNNA1*, DICER1*, EPCAM, EGFR, FH*, FLCN*, GREM1, HOXB13, KIT, LZTR1, MAX*, MBD4, MEN1*, MET, MITF, MLH1*, MSH2*, MSH3*, MSH6*, MUTYH*, NF1*, NF2*, NTHL1*, PALB2*, PDGFRA, PMS2*, POLD1*, POLE*, POT1*, PRKAR1A*, PTCH1*, PTEN*, RAD51C*, RAD51D*, RB1*, RET, SDHA*, SDHAF2*, SDHB*, SDHC*, SDHD*, SMAD4*, SMARCA4*, SMARCB1*, SMARCE1*, STK11*, SUFU*, TMEM127*, TP53*, TSC1*, TSC2*, VHL*. RNA analysis is performed for * genes.   01/31/2022 Surgery   S/p left simple mastectomy and SLNB  Invasive mammary carcinoma with focal mucinous features. Grade 2, LVI-, All margins negative for invasive carcinoma.  Four sentinel  lymph nodes negative for malignancy  pT1c pN0    01/31/2022 Cancer Staging   Staging form: Breast, AJCC 8th Edition - Pathologic stage from 01/31/2022: Stage IA (pT1c, pN0, cM0, G2, ER+, PR+, HER2-, Oncotype DX score: 17) - Signed by Timmy Forbes, MD on 02/21/2022 Stage prefix: Initial diagnosis Multigene prognostic tests performed: Oncotype DX Recurrence score range: Greater than or equal to 11 Histologic grading system: 3 grade system       INTERVAL HISTORY Stacy [name] is a 78 y.o. [name] who has above history reviewed by me today presents for follow up visit for Stage IA left breast invasive carcinoma, s/p left simple mastectomy and SLNB Can not tolerate Arimidex  due to joint pain. Tried 2 pills of Aromasin  and felt mood swing. She  preferred to switch to letrozole  2.5 mg daily. Join pain is better. + hot flush  Pain in the left knee and ankle, pre-existing prior to AI       MEDICAL HISTORY:  Past Medical History:  Diagnosis Date   Allergy    Arthritis    Cancer (HCC)    GERD (gastroesophageal reflux  disease)    Hyperlipidemia    Hypertension    Neoplasm of uncertain behavior of breast 2007   Pre-diabetes    Vertigo     SURGICAL HISTORY: Past Surgical History:  Procedure Laterality Date   BREAST BIOPSY Right 2015   3 stereo biopsies. 2 benign. one was radial scar, no excision done for radial scar   BREAST BIOPSY Left 07/16/2017   BIPHASIC STROMAL AND EPITHELIAL LESION./ Dr Marquita Situ   BREAST BIOPSY Left 08/23/2020   us  bx of mass 5:00 2cmfn, coil marker, FIBROEPITHELIAL LESION WITH INTRACANALICULAR GROWTH PATTERN AND VARIABLE STROMAL HYPERCELLULARITY   BREAST BIOPSY Left 01/02/2022   us  bx path pending 10:00 ribbon clip   BREAST BIOPSY Left 01/02/2022   US  LT BREAST BX W LOC DEV 1ST LESION IMG BX SPEC US  GUIDE 01/02/2022 [facility]   BREAST EXCISIONAL BIOPSY Left 1968   x 3 per pt   BREAST EXCISIONAL BIOPSY Right 2007   benign   BUNIONECTOMY Left    COLONOSCOPY  2011   DR,ISHAKIS   EXCISION OF BREAST LESION Left 10/07/2020   Procedure: EXCISION OF BREAST LESION;  Surgeon: Marshall Skeeter, MD;  Location: [facility] OR[facility];  Service: General;  Laterality: Left; FIBROEPITHELIAL LESION WITH NTRACANALICULAR GROWTH PATTERN AND VARIABLE STROMAL   HAMMER TOE SURGERY Left    2nd toe   HEEL SPUR SURGERY Left 12/15/2014   Dr. Mabel Savage at [facility]   HEEL SPUR SURGERY Right    MASTECTOMY W/ SENTINEL NODE BIOPSY Left 01/31/2022   Procedure: MASTECTOMY WITH SENTINEL LYMPH NODE BIOPSY;  Surgeon: Eldred Grego, MD;  Location: [facility];  Service: General;  Laterality: Left;   TOTAL KNEE ARTHROPLASTY Left 06/08/2021   Procedure: TOTAL KNEE ARTHROPLASTY;  Surgeon: Molli Angelucci, MD;  Location: [facility];  Service: Orthopedics;  Laterality: Left;    SOCIAL HISTORY: Social History   Socioeconomic History   Marital status: Married    Spouse name: Richard   Number of children: 1   Years of education: Not on file   Highest education level: 12th grade  Occupational History    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  Tobacco Use   Smoking status: Former    Current packs/day: 0.00    Average packs/day: 0.3 packs/day for 2.0 years (0.5 ttl pk-yrs)    Types: Cigarettes    Start date: 40    Quit date: 1974    Years since quitting: 51.4   Smokeless tobacco: Never   Tobacco comments:    smoking cessation materials not required  Vaping Use   Vaping status: Never Used  Substance and Sexual Activity   Alcohol use: No    Alcohol/week: 0.0 standard drinks of alcohol   Drug use: No   Sexual activity: Not Currently    Comment: Husband has prostate issues  Other Topics Concern   Not on file  Social History Narrative   Not on file   Social Drivers of Health   Financial Resource Strain: Low Risk  (04/11/2023)   Overall Financial Resource Strain (CARDIA)    Difficulty of Paying Living Expenses: Not hard at all  Food Insecurity: No Food Insecurity (04/11/2023)   Hunger Vital Sign    Worried About Running Out of Food in the Last Year: Never true    Ran Out of Food in the Last Year: Never true  Transportation Needs: No Transportation Needs (04/11/2023)   PRAPARE - Administrator, Civil Service (Medical): No    Lack of Transportation (Non-Medical): No  Physical Activity: Insufficiently Active (04/11/2023)   Exercise Vital Sign    Days of Exercise per Week: 1 day    Minutes of Exercise per Session: 10 min  Stress: No Stress Concern Present (04/11/2023)   [employer] of Occupational Health - Occupational Stress Questionnaire    Feeling of Stress : Not at all  Social Connections: Socially Integrated (04/11/2023)   Social Connection and Isolation Panel [NHANES]    Frequency of Communication with Friends and Family: More than three times a week    Frequency of Social Gatherings with Friends and Family: Once a week    Attends Religious Services: More than 4 times per year    Active Member of [employer] or Organizations: Yes    Attends Tax inspector Meetings: 1 to 4 times per year    Marital Status: Married  Catering manager Violence: Not At Risk (04/11/2023)   Humiliation, Afraid, Rape, and Kick questionnaire    Fear of Current or Ex-Partner: No    Emotionally Abused: No    Physically Abused: No    Sexually Abused: No    FAMILY HISTORY: Family History  Problem Relation  Age of Onset   Stroke Mother    Arthritis Mother    Breast cancer Sister 8   Breast cancer Sister    Cancer Brother 68       kidney   Kidney cancer Brother    Cancer Maternal Grandmother        Gallbladder   Stroke Paternal Grandmother     ALLERGIES:  is allergic to altace [ramipril].  MEDICATIONS:  Current Outpatient Medications  Medication Sig Dispense Refill   acetaminophen  (TYLENOL ) 500 MG tablet Take 500-1,000 mg by mouth every 6 (six) hours as needed (pain.).     aspirin  81 MG EC tablet Take 81 mg by mouth in the morning.     Cholecalciferol  (VITAMIN D -3 PO) Take 2,000 Units by mouth in the morning.     CINNAMON PO Take 1,000 mg by mouth in the morning.     diclofenac  Sodium (VOLTAREN ) 1 % GEL Apply 2 g topically 4 (four) times daily.     Elastic Bandages & Supports (THUMB BRACE) MISC 1 each by Does not apply route daily. Left thumb spica 1 each 0   FISH OIL-BORAGE-FLAX-SAFFLOWER PO Take 1 capsule by mouth in the morning.     letrozole  (FEMARA ) 2.5 MG tablet Take 1 tablet (2.5 mg total) by mouth daily. 30 tablet 2   loratadine  (CLARITIN ) 10 MG tablet Take 1 tablet (10 mg total) by mouth daily. 90 tablet 1   metoprolol  succinate (TOPROL -XL) 50 MG 24 hr tablet Take 1 tablet (50 mg total) by mouth daily. 30 tablet 0   Multiple Vitamin (MULTIVITAMIN) capsule Take 1 capsule by mouth daily.     rosuvastatin  (CRESTOR ) 5 MG tablet Take 1 tablet (5 mg total) by mouth every Monday, Wednesday, and Friday. 36 tablet 3   traZODone  (DESYREL ) 50 MG tablet Take 0.5-1 tablets (25-50 mg total) by mouth at bedtime as needed for sleep. 90 tablet 0    triamterene -hydrochlorothiazide  (MAXZIDE -25) 37.5-25 MG tablet Take 1 tablet by mouth daily. 90 tablet 1   No current facility-administered medications for this visit.    Review of Systems  Constitutional:  Negative for appetite change, chills, fatigue and fever.  HENT:   Negative for hearing loss and voice change.   Eyes:  Negative for eye problems.  Respiratory:  Negative for chest tightness and cough.   Cardiovascular:  Negative for chest pain.  Gastrointestinal:  Negative for abdominal distention, abdominal pain and blood in stool.  Endocrine: Negative for hot flashes.  Genitourinary:  Negative for difficulty urinating and frequency.   Musculoskeletal:  Negative for arthralgias.  Skin:  Negative for itching and rash.  Neurological:  Negative for extremity weakness.  Hematological:  Negative for adenopathy.  Psychiatric/Behavioral:  Negative for confusion.      PHYSICAL EXAMINATION: ECOG PERFORMANCE STATUS: 0 - Asymptomatic  Vitals:   05/29/23 1024 05/29/23 1037  BP: (!) 187/87 (!) 164/78  Pulse: 66   Resp: 18   Temp: (!) 95.5 F (35.3 C)   SpO2: 99%    Filed Weights   05/29/23 1024  Weight: 203 lb 4.8 oz (92.2 kg)    Physical Exam Constitutional:      General: She is not in acute distress.    Appearance: She is not diaphoretic.  HENT:     Head: Normocephalic.     Nose: Nose normal.     Mouth/Throat:     Pharynx: No oropharyngeal exudate.  Eyes:     General: No scleral icterus.    Pupils:  Pupils are equal, round, and reactive to light.  Cardiovascular:     Rate and Rhythm: Normal rate.  Pulmonary:     Effort: Pulmonary effort is normal. No respiratory distress.  Abdominal:     General: There is no distension.     Palpations: Abdomen is soft.  Musculoskeletal:        General: Normal range of motion.     Cervical back: Normal range of motion.  Skin:    General: Skin is warm and dry.     Findings: No erythema.  Neurological:     Mental Status: She is  alert and oriented to person, place, and time. Mental status is at baseline.     Cranial Nerves: No cranial nerve deficit.     Motor: No abnormal muscle tone.  Psychiatric:        Mood and Affect: Affect normal.     LABORATORY DATA:  I have reviewed the data as listed    Latest Ref Rng & Units 05/29/2023   10:05 AM 11/26/2022    9:49 AM 05/23/2022    9:56 AM  CBC  WBC 4.0 - 10.5 K/uL 3.6  4.1  3.7   Hemoglobin 12.0 - 15.0 g/dL 09.8  11.9  14.7   Hematocrit 36.0 - 46.0 % 38.5  40.3  37.9   Platelets 150 - 400 K/uL 207  237  222       Latest Ref Rng & Units 05/29/2023   10:06 AM 11/29/2022    9:29 AM 11/26/2022    9:49 AM  CMP  Glucose 70 - 99 mg/dL 829   562   BUN 8 - 23 mg/dL 18   15   Creatinine 1.30 - 1.00 mg/dL 8.65   7.84   Sodium 696 - 145 mmol/L 137   140   Potassium 3.5 - 5.1 mmol/L 4.1  4.1  3.4   Chloride 98 - 111 mmol/L 102   103   CO2 22 - 32 mmol/L 27   26   Calcium  8.9 - 10.3 mg/dL 9.0   9.4   Total Protein 6.5 - 8.1 g/dL 7.3   7.9   Total Bilirubin 0.0 - 1.2 mg/dL 0.7   0.7   Alkaline Phos 38 - 126 U/L 51   59   AST 15 - 41 U/L 23   23   ALT 0 - 44 U/L 23   21      RADIOGRAPHIC STUDIES: I have personally reviewed the radiological images as listed and agreed with the findings in the report. No results found.

## 2023-05-29 NOTE — Assessment & Plan Note (Addendum)
 Left breast invasive carcinoma, ER+. PR+. HER2- s/p mastectomy,  pT1c pN0, Oncotype DX 17 Pathology was reviewed and discussed with patient.  No need for adjuvant chemotherapy  Labs are reviewed and discussed with patient. Continue letrozole  2.5mg  daily. She is able to tolerate with manageable hot flushes Annual right screening mammogram - Nov 2025

## 2023-05-29 NOTE — Assessment & Plan Note (Signed)
Likely due to osteoarthritis, pre-existing, worsen with AI Recommend topical Voltaren.

## 2023-06-06 ENCOUNTER — Other Ambulatory Visit: Payer: Self-pay

## 2023-06-06 MED ORDER — LETROZOLE 2.5 MG PO TABS: 2.5000 mg | ORAL_TABLET | Freq: Every day | ORAL | 1 refills | Status: DC

## 2023-06-21 ENCOUNTER — Ambulatory Visit (INDEPENDENT_AMBULATORY_CARE_PROVIDER_SITE_OTHER): Admitting: Podiatry

## 2023-06-21 ENCOUNTER — Encounter: Payer: Self-pay | Admitting: Podiatry

## 2023-06-21 VITALS — Ht 67.0 in | Wt 203.3 lb

## 2023-06-21 DIAGNOSIS — M79675 Pain in left toe(s): Secondary | ICD-10-CM

## 2023-06-21 DIAGNOSIS — B351 Tinea unguium: Secondary | ICD-10-CM | POA: Diagnosis not present

## 2023-06-21 DIAGNOSIS — E119 Type 2 diabetes mellitus without complications: Secondary | ICD-10-CM

## 2023-06-21 DIAGNOSIS — M79674 Pain in right toe(s): Secondary | ICD-10-CM

## 2023-06-28 ENCOUNTER — Encounter: Payer: Self-pay | Admitting: Podiatry

## 2023-06-28 NOTE — Progress Notes (Signed)
  Subjective:  Patient ID: Stacy [name], female    DOB: 1945/12/07,  MRN: [id number]  Stacy [name] presents to clinic today for preventative diabetic foot care and painful mycotic toenails of both feet that are difficult to trim. Pain interferes with daily activities and wearing enclosed shoe gear comfortably. Patient states her great toes are tender. Chief Complaint  Patient presents with   Nail Problem    Pt is here for [facility] last A1C 6 PCP is Dr Ava Lei and LOV was in March.   New problem(s): None.   PCP is Sowles, Krichna, MD.  Allergies  Allergen Reactions   Altace [Ramipril] Swelling and Other (See Comments)    lips    Review of Systems: Negative except as noted in the HPI.  Objective: No changes noted in today's physical examination. There were no vitals filed for this visit. Stacy [name] is a pleasant 78 y.o. female in NAD. AAO x 3.  Vascular Examination: Capillary refill time immediate b/l. Vascular status intact b/l with palpable pedal pulses. Pedal hair diminished b/l. No pain with calf compression b/l. Skin temperature gradient WNL b/l. No cyanosis or clubbing b/l. No ischemia or gangrene noted b/l. +1 pitting edema noted BLE. Varicosities present b/l.  Neurological Examination: Sensation grossly intact b/l with 10 gram monofilament. Vibratory sensation intact b/l.   Dermatological Examination: Pedal skin with normal turgor, texture and tone b/l.  No open wounds. No interdigital macerations.   Toenails 1-5 b/l thick, discolored, elongated with subungual debris and pain on dorsal palpation.   Incurvated nailplate medial border bilateral great toes.  Nail border hypertrophy minimal. There is tenderness to palpation. Sign(s) of infection: no clinical signs of infection noted on examination today..  Musculoskeletal Examination: Muscle strength 5/5 to all lower extremity muscle groups bilaterally. No pain, crepitus or joint limitation noted with ROM bilateral  LE. No gross bony deformities bilaterally. Utilizes cane for ambulation assistance.  Radiographs: None  Assessment/Plan: 1. Pain due to onychomycosis of toenails of both feet   2. Diabetes mellitus type 2, diet-controlled (HCC)   Consent given for treatment. Patient examined. All patient's and/or POA's questions/concerns addressed on today's visit. Mycotic toenails 1-5 debrided in length and girth without incident. Continue foot and shoe inspections daily. Monitor blood glucose per PCP/Endocrinologist's recommendations.Continue soft, supportive shoe gear daily. Report any pedal injuries to medical professional. Discussed chronicity of ingrown toenail(s) of bilateral great toes. Recommended patient consider having matrixectomy performed to alleviate chronic ingrown toenail(s). Discussed in-office procedure and post-procedure instructions. Patient states they will think about it. No invasive procedure(s) performed. Offending nail border debrided and curretaged bilateral great toes utilizing sterile nail nipper and currette. Border cleansed with alcohol and triple antibiotic ointment applied. No further treatment required by patient/caregiver. Call office if there are any concerns.  Return in about 3 months (around 09/21/2023).  Stacy [name], DPM      [place] LOCATION: [address] [place] [address]                   Office [phone]   [facility] LOCATION: [address] [place], [place] [address] Office [phone]

## 2023-07-02 ENCOUNTER — Other Ambulatory Visit: Payer: Self-pay | Admitting: Family Medicine

## 2023-07-02 DIAGNOSIS — I152 Hypertension secondary to endocrine disorders: Secondary | ICD-10-CM

## 2023-07-02 NOTE — Telephone Encounter (Signed)
 Copied from CRM [id number]. Topic: Clinical - Medication Refill >> Jul 02, 2023 11:54 AM Jalayah J wrote: Medication: metoprolol  succinate (TOPROL -XL) 50 MG 24 hr tablet  Has the patient contacted their pharmacy? Yes (Agent: If no, request that the patient contact the pharmacy for the refill. If patient does not wish to contact the pharmacy document the reason why and proceed with request.) (Agent: If yes, when and what did the pharmacy advise?)  This is the patient's preferred pharmacy:    [facility][id number] [place], [place] - [address] AT [facility] OF [address] [place] [address] [place] [address] Phone: [phone] Fax: [phone]  Is this the correct pharmacy for this prescription? Yes If no, delete pharmacy and type the correct one.   Has the prescription been filled recently? No  Is the patient out of the medication? Yes  Has the patient been seen for an appointment in the last year OR does the patient have an upcoming appointment? Yes  Can we respond through MyChart? No  Agent: Please be advised that Rx refills may take up to 3 business days. We ask that you follow-up with your pharmacy.

## 2023-07-04 ENCOUNTER — Telehealth: Payer: Self-pay | Admitting: Family Medicine

## 2023-07-04 DIAGNOSIS — I152 Hypertension secondary to endocrine disorders: Secondary | ICD-10-CM

## 2023-07-04 MED ORDER — METOPROLOL SUCCINATE ER 50 MG PO TB24: 50.0000 mg | ORAL_TABLET | Freq: Every day | ORAL | 0 refills | Status: DC

## 2023-07-04 NOTE — Telephone Encounter (Signed)
 metoprolol  succinate (TOPROL -XL) 50 MG 24 hr [facility] but [facility] is [facility] to go to [facility]

## 2023-07-04 NOTE — Telephone Encounter (Signed)
 Pt called back to report that she needs this to go to   [facility] #[id number] - [place], [place] - [address] AT [facility] OF [address] [facility] [address] [place] [address] Phone: [phone] Fax: [phone]  And not [facility]. She requested this on 07/02/2023 please advise. She says it is more affordable for her at [facility]. Wants 90 day supply specifically.

## 2023-07-04 NOTE — Telephone Encounter (Signed)
 Requested Prescriptions  Pending Prescriptions Disp Refills   metoprolol  succinate (TOPROL -XL) 50 MG 24 hr tablet 90 tablet 0    Sig: Take 1 tablet (50 mg total) by mouth daily.     Cardiovascular:  Beta Blockers Failed - 07/04/2023  1:34 PM      Failed - Last BP in normal range    BP Readings from Last 1 Encounters:  05/29/23 (!) 164/78         Passed - Last Heart Rate in normal range    Pulse Readings from Last 1 Encounters:  05/29/23 66         Passed - Valid encounter within last 6 months    Recent Outpatient Visits           3 months ago Hypertension associated with type 2 diabetes mellitus [facility])   [facility] Arleen Lacer, MD       Future Appointments             In 4 weeks Sowles, Krichna, MD [facility], [facility]

## 2023-08-02 ENCOUNTER — Other Ambulatory Visit: Payer: Self-pay | Admitting: Family Medicine

## 2023-08-02 ENCOUNTER — Ambulatory Visit: Admitting: Family Medicine

## 2023-08-02 ENCOUNTER — Encounter: Payer: Self-pay | Admitting: Family Medicine

## 2023-08-02 ENCOUNTER — Telehealth: Payer: Self-pay

## 2023-08-02 VITALS — BP 140/74 | HR 76 | Resp 16 | Ht 67.0 in | Wt 203.7 lb

## 2023-08-02 DIAGNOSIS — E559 Vitamin D deficiency, unspecified: Secondary | ICD-10-CM | POA: Diagnosis not present

## 2023-08-02 DIAGNOSIS — E785 Hyperlipidemia, unspecified: Secondary | ICD-10-CM

## 2023-08-02 DIAGNOSIS — C50912 Malignant neoplasm of unspecified site of left female breast: Secondary | ICD-10-CM | POA: Diagnosis not present

## 2023-08-02 DIAGNOSIS — M150 Primary generalized (osteo)arthritis: Secondary | ICD-10-CM

## 2023-08-02 DIAGNOSIS — E1159 Type 2 diabetes mellitus with other circulatory complications: Secondary | ICD-10-CM

## 2023-08-02 DIAGNOSIS — Z96652 Presence of left artificial knee joint: Secondary | ICD-10-CM

## 2023-08-02 DIAGNOSIS — E1169 Type 2 diabetes mellitus with other specified complication: Secondary | ICD-10-CM

## 2023-08-02 DIAGNOSIS — G894 Chronic pain syndrome: Secondary | ICD-10-CM

## 2023-08-02 DIAGNOSIS — G4701 Insomnia due to medical condition: Secondary | ICD-10-CM

## 2023-08-02 DIAGNOSIS — I152 Hypertension secondary to endocrine disorders: Secondary | ICD-10-CM | POA: Diagnosis not present

## 2023-08-02 DIAGNOSIS — N3941 Urge incontinence: Secondary | ICD-10-CM

## 2023-08-02 LAB — POCT GLYCOSYLATED HEMOGLOBIN (HGB A1C): Hemoglobin A1C: 6.1 % — AB (ref 4.0–5.6)

## 2023-08-02 MED ORDER — DULOXETINE HCL 30 MG PO CPEP: 30.0000 mg | ORAL_CAPSULE | Freq: Every day | ORAL | 0 refills | Status: DC

## 2023-08-02 MED ORDER — METOPROLOL SUCCINATE ER 50 MG PO TB24: 50.0000 mg | ORAL_TABLET | Freq: Every day | ORAL | 1 refills | Status: DC

## 2023-08-02 MED ORDER — DULOXETINE HCL 60 MG PO CPEP: 60.0000 mg | ORAL_CAPSULE | ORAL | 0 refills | Status: DC

## 2023-08-02 NOTE — Telephone Encounter (Signed)
 Copied from CRM [id number]. Topic: Clinical - Medication Refill >> Aug 02, 2023  1:11 PM Everette C wrote: Medication: DULoxetine (CYMBALTA) 60 MG capsule [[id number]]  DULoxetine (CYMBALTA) 30 MG capsule [[id number]]  metoprolol  succinate (TOPROL -XL) 50 MG 24 hr tablet [[id number]]  Has the patient contacted their pharmacy? Yes (Agent: If no, request that the patient contact the pharmacy for the refill. If patient does not wish to contact the pharmacy document the reason why and proceed with request.) (Agent: If yes, when and what did the pharmacy advise?)  This is the patient's preferred pharmacy:  [facility] #[id number] [place], [place] - [address] AT [facility] OF [address] [place] [address] [place] [address] Phone: [phone] Fax: [phone]  Is this the correct pharmacy for this prescription? Yes If no, delete pharmacy and type the correct one.   Has the prescription been filled recently? No  Is the patient out of the medication? Yes  Has the patient been seen for an appointment in the last year OR does the patient have an upcoming appointment? Yes  Can we respond through MyChart? No  Agent: Please be advised that Rx refills may take up to 3 business days. We ask that you follow-up with your pharmacy.

## 2023-08-02 NOTE — Telephone Encounter (Signed)
 Copied from CRM [id number]. Topic: Clinical - Medication Refill >> Aug 02, 2023  1:11 PM Everette C wrote: Medication: DULoxetine (CYMBALTA) 60 MG capsule [[id number]]  DULoxetine (CYMBALTA) 30 MG capsule [[id number]]  metoprolol  succinate (TOPROL -XL) 50 MG 24 hr tablet [[id number]]  Has the patient contacted their pharmacy? Yes (Agent: If no, request that the patient contact the pharmacy for the refill. If patient does not wish to contact the pharmacy document the reason why and proceed with request.) (Agent: If yes, when and what did the pharmacy advise?)  This is the patient's preferred pharmacy:  [id number] [place], [place] - [address] AT [facility] [address] [place] [address] [place] [address] Phone: [phone] Fax: [phone]  Is this the correct pharmacy for this prescription? Yes If no, delete pharmacy and type the correct one.   Has the prescription been filled recently? No  Is the patient out of the medication? Yes  Has the patient been seen for an appointment in the last year OR does the patient have an upcoming appointment? Yes  Can we respond through MyChart? No  Agent: Please be advised that Rx refills may take up to 3 business days. We ask that you follow-up with your pharmacy. >> Aug 02, 2023  1:29 PM [name] wrote: Patient called.. [facility] per instructions.

## 2023-08-02 NOTE — Telephone Encounter (Signed)
 Both medication were sent to [facility]

## 2023-08-02 NOTE — Progress Notes (Signed)
 Name: Stacy [name]   MRN: [id number]    DOB: 07/20/1945   Date:08/02/2023       Progress Note  Subjective  Chief Complaint  Follow up History of Present Illness Stacy [name] is a 78 year old female who presents for a follow-up visit after her second total hip replacement.  She experiences pain in her hands, particularly in the thumbs, previously treated with a corticosteroid injection for De Quervain's tenosynovitis. The pain initially improved but has since returned. She takes Tylenol  500 mg as needed, sometimes two tablets at night.  She has a history of diabetes, currently diet-controlled, with a recent A1c of 6.1. She also has hypertension and hyperlipidemia, for which she takes rosuvastatin  5 mg, triamterene  37.5/25 mg, and metoprolol  50 mg XL. She reports frequent urination and urgency and uses pads for incontinence.  She has a history of invasive ductal carcinoma of the left breast, status post mastectomy, and is currently on Femara  (letrozole ) 2.5 mg. She experiences joint pain, which she suspects may be related to her medication.  She reports swelling in her feet and legs, which she manages with compression stockings. No muscle aches from her cholesterol medication, but she experiences joint pain. Invasive carcinoma of the left breast: mastectomy is scheduled for 01/31/2022 with Dr. Bella, she is under the care of Dr. Babara. She is currently taking Femara  ( Letrozole  ) she could not tolerate Arimidex  due to increase in hot flashes and arthralgias. However she is now noticing more body aches on Femara    DMII: doing well, on diet only, can't take ACE caused angioedema, last urine micro negative . Denies polyphagia, polydipsia or polyuria.  She is following a diabetic diet. She has been compliant with Crestor  5 mg M, W and Fridays , last LDL was at goal at 41 , A1C today was stable at 6.1 %    HTN: taking triamterene  hydrochlorothiazide  and also metoprolol  .  Denies chest pain,  dizziness, palpitation, orthopnea  but has intermittent mild SOB.  She has chronic lower extremity swelling , she states better with compression stocking hoses.  Cannot take ACE due to angioedema and norvasc causes edema   DeQuervain's tendinophaty and arthralgias, she states always in pain, asked me about gabapentin. She thinks pain is secondary to Femara , but has OA in multiple joints  Insomnia: she is now taking Trazodone  prn   Urge Incontinence seems to be getting worse, discussed changing bp medication to decrease symptoms but wants to hold off for now  Patient Active Problem List   Diagnosis Date Noted   Urge incontinence 08/02/2023   Primary osteoarthritis involving multiple joints 08/02/2023   Chronic pain syndrome 08/02/2023   Insomnia due to medical condition 08/02/2023   Aromatase inhibitor use 05/23/2022   Arthralgia 05/23/2022   Genetic testing 02/05/2022   Invasive carcinoma of breast (HCC) 01/16/2022   Goals of care, counseling/discussion 01/16/2022   S/P TKR (total knee replacement) using cement, left 06/08/2021   Dyslipidemia associated with type 2 diabetes mellitus (HCC) 01/14/2020   Onychomycosis of multiple toenails with type 2 diabetes mellitus (HCC) 01/23/2019   Pain due to onychomycosis of toenails of both feet 10/06/2018   Abnormal EKG 12/10/2016   Radial scar of breast 05/30/2015   History of foot surgery 12/15/2014   Hypertension associated with type 2 diabetes mellitus (HCC) 10/07/2014   Seasonal allergies 10/07/2014   Vitamin D  deficiency 10/07/2014   Varicose veins 10/07/2014   Family history of breast cancer 05/12/2012  Past Surgical History:  Procedure Laterality Date   BREAST BIOPSY Right 2015   3 stereo biopsies. 2 benign. one was radial scar, no excision done for radial scar   BREAST BIOPSY Left 07/16/2017   BIPHASIC STROMAL AND EPITHELIAL LESION./ Dr Dessa   BREAST BIOPSY Left 08/23/2020   us  bx of mass 5:00 2cmfn, coil marker,  FIBROEPITHELIAL LESION WITH INTRACANALICULAR GROWTH PATTERN AND VARIABLE STROMAL HYPERCELLULARITY   BREAST BIOPSY Left 01/02/2022   us  bx path pending 10:00 ribbon clip   BREAST BIOPSY Left 01/02/2022   US  LT BREAST BX W LOC DEV 1ST LESION IMG BX SPEC US  GUIDE 01/02/2022 [facility]   BREAST EXCISIONAL BIOPSY Left 1968   x 3 per pt   BREAST EXCISIONAL BIOPSY Right 2007   benign   BUNIONECTOMY Left    COLONOSCOPY  2011   DR,ISHAKIS   EXCISION OF BREAST LESION Left 10/07/2020   Procedure: EXCISION OF BREAST LESION;  Surgeon: Dessa Reyes ORN, MD;  Location: [facility];  Service: General;  Laterality: Left; FIBROEPITHELIAL LESION WITH NTRACANALICULAR GROWTH PATTERN AND VARIABLE STROMAL   HAMMER TOE SURGERY Left    2nd toe   HEEL SPUR SURGERY Left 12/15/2014   Dr. Alona at [facility]   HEEL SPUR SURGERY Right    MASTECTOMY W/ SENTINEL NODE BIOPSY Left 01/31/2022   Procedure: MASTECTOMY WITH SENTINEL LYMPH NODE BIOPSY;  Surgeon: Rodolph Romano, MD;  Location: [facility] OR[facility];  Service: General;  Laterality: Left;   TOTAL KNEE ARTHROPLASTY Left 06/08/2021   Procedure: TOTAL KNEE ARTHROPLASTY;  Surgeon: Kathlynn Sharper, MD;  Location: [facility] OR[facility];  Service: Orthopedics;  Laterality: Left;    Family History  Problem Relation Age of Onset   Stroke Mother    Arthritis Mother    Breast cancer Sister 73   Breast cancer Sister    Cancer Brother 38       kidney   Kidney cancer Brother    Cancer Maternal Grandmother        Gallbladder   Stroke Paternal Grandmother     Social History   Tobacco Use   Smoking status: Former    Current packs/day: 0.00    Average packs/day: 0.3 packs/day for 2.0 years (0.5 ttl pk-yrs)    Types: Cigarettes    Start date: 34    Quit date: 60    Years since quitting: 51.5   Smokeless tobacco: Never   Tobacco comments:    smoking cessation materials not required  Substance Use Topics   Alcohol use: No    Alcohol/week: 0.0 standard drinks of  alcohol     Current Outpatient Medications:    acetaminophen  (TYLENOL ) 500 MG tablet, Take 500-1,000 mg by mouth every 6 (six) hours as needed (pain.)., Disp: , Rfl:    aspirin  81 MG EC tablet, Take 81 mg by mouth in the morning., Disp: , Rfl:    Cholecalciferol  (VITAMIN D -3 PO), Take 2,000 Units by mouth in the morning., Disp: , Rfl:    CINNAMON PO, Take 1,000 mg by mouth in the morning., Disp: , Rfl:    diclofenac  Sodium (VOLTAREN ) 1 % GEL, Apply 2 g topically 4 (four) times daily., Disp: , Rfl:    DULoxetine (CYMBALTA) 60 MG capsule, Take 1 capsule (60 mg total) by mouth every morning., Disp: 90 capsule, Rfl: 0   Elastic Bandages & Supports (THUMB BRACE) MISC, 1 each by Does not apply route daily. Left thumb spica, Disp: 1 each, Rfl: 0   FISH OIL-BORAGE-FLAX-SAFFLOWER PO,  Take 1 capsule by mouth in the morning., Disp: , Rfl:    letrozole  (FEMARA ) 2.5 MG tablet, Take 1 tablet (2.5 mg total) by mouth daily., Disp: 90 tablet, Rfl: 1   loratadine  (CLARITIN ) 10 MG tablet, Take 1 tablet (10 mg total) by mouth daily., Disp: 90 tablet, Rfl: 1   Multiple Vitamin (MULTIVITAMIN) capsule, Take 1 capsule by mouth daily., Disp: , Rfl:    rosuvastatin  (CRESTOR ) 5 MG tablet, Take 1 tablet (5 mg total) by mouth every Monday, Wednesday, and Friday., Disp: 36 tablet, Rfl: 3   traZODone  (DESYREL ) 50 MG tablet, Take 0.5-1 tablets (25-50 mg total) by mouth at bedtime as needed for sleep., Disp: 90 tablet, Rfl: 0   triamterene -hydrochlorothiazide  (MAXZIDE -25) 37.5-25 MG tablet, Take 1 tablet by mouth daily., Disp: 90 tablet, Rfl: 1   DULoxetine (CYMBALTA) 30 MG capsule, Take 1 capsule (30 mg total) by mouth daily., Disp: 30 capsule, Rfl: 0   metoprolol  succinate (TOPROL -XL) 50 MG 24 hr tablet, Take 1 tablet (50 mg total) by mouth daily., Disp: 90 tablet, Rfl: 1  Allergies  Allergen Reactions   Altace [Ramipril] Swelling and Other (See Comments)    lips    I personally reviewed active problem list, medication  list, allergies with the patient/caregiver today.   ROS  Ten systems reviewed and is negative except as mentioned in HPI    Objective Physical Exam VITALS: BP- 147/ CONSTITUTIONAL: Patient appears well-developed and well-nourished.  No distress. HEENT: Head atraumatic, normocephalic, neck supple. CARDIOVASCULAR: Normal rate, regular rhythm and normal heart sounds.  No murmur heard. No BLE edema. PULMONARY: Effort normal and breath sounds normal. No respiratory distress. ABDOMINAL: There is no tenderness or distention. MUSCULOSKELETAL: Normal gait. Without gross motor or sensory deficit. PSYCHIATRIC: Patient has a normal mood and affect. behavior is normal. Judgment and thought content normal.  Vitals:   08/02/23 1027 08/02/23 1118  BP: (!) 140/74 (!) 140/74  Pulse: 76   Resp: 16   SpO2: 99%   Weight: 203 lb 11.2 oz (92.4 kg)   Height: 5' 7 (1.702 m)     Body mass index is 31.9 kg/m.  Recent Results (from the past 2160 hours)  CBC with Differential (Cancer Center Only)     Status: Abnormal   Collection Time: 05/29/23 10:05 AM  Result Value Ref Range   WBC Count 3.6 (L) 4.0 - 10.5 K/uL   RBC 4.32 3.87 - 5.11 MIL/uL   Hemoglobin 12.0 12.0 - 15.0 g/dL   HCT 61.4 63.9 - 53.9 %   MCV 89.1 80.0 - 100.0 fL   MCH 27.8 26.0 - 34.0 pg   MCHC 31.2 30.0 - 36.0 g/dL   RDW 85.8 88.4 - 84.4 %   Platelet Count 207 150 - 400 K/uL   nRBC 0.0 0.0 - 0.2 %   Neutrophils Relative % 45 %   Neutro Abs 1.6 (L) 1.7 - 7.7 K/uL   Lymphocytes Relative 37 %   Lymphs Abs 1.3 0.7 - 4.0 K/uL   Monocytes Relative 10 %   Monocytes Absolute 0.4 0.1 - 1.0 K/uL   Eosinophils Relative 7 %   Eosinophils Absolute 0.2 0.0 - 0.5 K/uL   Basophils Relative 1 %   Basophils Absolute 0.0 0.0 - 0.1 K/uL   Immature Granulocytes 0 %   Abs Immature Granulocytes 0.01 0.00 - 0.07 K/uL    Comment: Performed at [facility], [address] [place], [place] [address]  CMP (Cancer Center only)  Status:  Abnormal   Collection Time: 05/29/23 10:06 AM  Result Value Ref Range   Sodium 137 135 - 145 mmol/L   Potassium 4.1 3.5 - 5.1 mmol/L   Chloride 102 98 - 111 mmol/L   CO2 27 22 - 32 mmol/L   Glucose, Bld 126 (H) 70 - 99 mg/dL    Comment: Glucose reference range applies only to samples taken after fasting for at least 8 hours.   BUN 18 8 - 23 mg/dL   Creatinine 9.28 9.55 - 1.00 mg/dL   Calcium  9.0 8.9 - 10.3 mg/dL   Total Protein 7.3 6.5 - 8.1 g/dL   Albumin 4.0 3.5 - 5.0 g/dL   AST 23 15 - 41 U/L   ALT 23 0 - 44 U/L   Alkaline Phosphatase 51 38 - 126 U/L   Total Bilirubin 0.7 0.0 - 1.2 mg/dL   GFR, Estimated >39 >39 mL/min    Comment: (NOTE) Calculated using the CKD-EPI Creatinine Equation (2021)    Anion gap 8 5 - 15    Comment: Performed at [facility], [address] [place], [place] [address]  POCT glycosylated hemoglobin (Hb A1C)     Status: Abnormal   Collection Time: 08/02/23 10:31 AM  Result Value Ref Range   Hemoglobin A1C 6.1 (A) 4.0 - 5.6 %   HbA1c POC (<> result, manual entry)     HbA1c, POC (prediabetic range)     HbA1c, POC (controlled diabetic range)       PHQ2/9:    08/02/2023   10:20 AM 04/11/2023    8:57 AM 03/29/2023   11:22 AM 11/29/2022    8:41 AM 05/28/2022    9:52 AM  Depression screen PHQ 2/9  Decreased Interest 0 0 0 0 0  Down, Depressed, Hopeless 0 0 0 0 0  PHQ - 2 Score 0 0 0 0 0  Altered sleeping  0 0 0 0  Tired, decreased energy  0 0 0 0  Change in appetite  0 0 0 0  Feeling bad or failure about yourself   0 0 0 0  Trouble concentrating  0 0 0 0  Moving slowly or fidgety/restless  0 0 0 0  Suicidal thoughts  0 0 0 0  PHQ-9 Score  0 0 0 0  Difficult doing work/chores  Not difficult at all Not difficult at all      phq 9 is negative  Fall Risk:    08/02/2023   10:20 AM 04/11/2023    8:54 AM 03/29/2023   11:22 AM 11/29/2022    8:41 AM 05/28/2022    9:52 AM  Fall Risk   Falls in the past year? 0 0 0 0 0  Number falls in past  yr: 0 0 0  0  Injury with Fall? 0 0   0  Risk for fall due to : No Fall Risks No Fall Risks No Fall Risks Impaired balance/gait Impaired balance/gait;Impaired mobility  Follow up Falls evaluation completed Falls evaluation completed Falls prevention discussed;Education provided;Falls evaluation completed Falls prevention discussed;Education provided;Falls evaluation completed Falls prevention discussed      Assessment & Plan Chronic pain due to osteoarthritis and post-joint replacement Chronic pain in hands and knees likely due to osteoarthritis and post-joint replacement. Pain managed with Tylenol . Discussed duloxetine for pain management and potential side effects. - Start duloxetine 30 mg daily, increase to 60 mg after one week if tolerated. - Continue Tylenol  500 mg up to four times  daily for pain management.  Invasive ductal carcinoma of left breast, status post mastectomy, on letrozole  Invasive ductal carcinoma of the left breast, status post mastectomy. On letrozole , reports joint pain possibly due to medication. Plans to discuss with oncologist. - Continue letrozole  2.5 mg daily. - Discuss joint pain and potential medication changes with oncologist.  Hypertension, on metoprolol  and triamterene /HCTZ Hypertension managed with metoprolol  and triamterene /HCTZ. Blood pressure slightly elevated. Urinary urgency and frequency likely due to diuretic use. Discussed potential medication changes, but she prefers current regimen. - Continue metoprolol  and triamterene /HCTZ. - Consider medication change if urinary symptoms worsen.  Urinary urgency and frequency, likely diuretic-induced Urinary urgency and frequency likely induced by diuretic use. Discussed potential medication changes, but she prefers current regimen. - Avoid caffeine. - Consider medication change if symptoms worsen.  Edema of lower extremities Edema of lower extremities noted. Discussed potential medication changes, but she  prefers current regimen. Compression stockings are helpful. - Continue use of compression stockings.  Type 2 diabetes mellitus, diet controlled with dyslipidemia and HTN Type 2 diabetes mellitus well controlled with diet. Recent A1c is 6.1%.  Hyperlipidemia, on rosuvastatin  Hyperlipidemia managed with rosuvastatin . - Continue rosuvastatin  5 mg daily.  History of left total hip replacement (repeat) History of left total hip replacement, repeat procedure.  History of left knee replacement History of left knee replacement. Range of motion exercises recommended. - Continue range of motion exercises for left knee.

## 2023-08-05 NOTE — Telephone Encounter (Signed)
 Rx - 08/02/23- 2 Rx sent in- not due and Duloxetine  60mg - not due until finishes 30mg  Rx Requested Prescriptions  Refused Prescriptions Disp Refills   DULoxetine  (CYMBALTA ) 30 MG capsule 30 capsule 0    Sig: Take 1 capsule (30 mg total) by mouth daily.     Psychiatry: Antidepressants - SNRI - duloxetine  Failed - 08/05/2023  1:52 PM      Failed - Last BP in normal range    BP Readings from Last 1 Encounters:  08/02/23 (!) 140/74         Passed - Cr in normal range and within 360 days    Creatinine  Date Value Ref Range Status  05/29/2023 0.71 0.44 - 1.00 mg/dL Final   Creat  Date Value Ref Range Status  09/14/2021 0.78 0.60 - 1.00 mg/dL Final   Creatinine, POC  Date Value Ref Range Status  10/11/2016 Neg mg/dL Final   Creatinine, Urine  Date Value Ref Range Status  11/29/2022 35 20 - 275 mg/dL Final         Passed - eGFR is 30 or above and within 360 days    GFR, Est African American  Date Value Ref Range Status  07/22/2019 74 > OR = 60 mL/min/1.67m2 Final   GFR, Est Non African American  Date Value Ref Range Status  07/22/2019 64 > OR = 60 mL/min/1.17m2 Final   GFR, Estimated  Date Value Ref Range Status  05/29/2023 >60 >60 mL/min Final    Comment:    (NOTE) Calculated using the CKD-EPI Creatinine Equation (2021)    eGFR  Date Value Ref Range Status  09/14/2021 80 > OR = 60 mL/min/1.47m2 Final         Passed - Completed PHQ-2 or PHQ-9 in the last 360 days      Passed - Valid encounter within last 6 months    Recent Outpatient Visits           3 days ago Hypertension associated with type 2 diabetes mellitus [facility])   [place] [facility] Glenard Mire, MD   4 months ago Hypertension associated with type 2 diabetes mellitus [facility])   [place] [facility] Sowles, Krichna, MD               DULoxetine  (CYMBALTA ) 60 MG capsule 90 capsule 0    Sig: Take 1 capsule (60 mg total) by mouth every morning.     Psychiatry:  Antidepressants - SNRI - duloxetine  Failed - 08/05/2023  1:52 PM      Failed - Last BP in normal range    BP Readings from Last 1 Encounters:  08/02/23 (!) 140/74         Passed - Cr in normal range and within 360 days    Creatinine  Date Value Ref Range Status  05/29/2023 0.71 0.44 - 1.00 mg/dL Final   Creat  Date Value Ref Range Status  09/14/2021 0.78 0.60 - 1.00 mg/dL Final   Creatinine, POC  Date Value Ref Range Status  10/11/2016 Neg mg/dL Final   Creatinine, Urine  Date Value Ref Range Status  11/29/2022 35 20 - 275 mg/dL Final         Passed - eGFR is 30 or above and within 360 days    GFR, Est African American  Date Value Ref Range Status  07/22/2019 74 > OR = 60 mL/min/1.84m2 Final   GFR, Est Non African American  Date Value Ref Range Status  07/22/2019  64 > OR = 60 mL/min/1.37m2 Final   GFR, Estimated  Date Value Ref Range Status  05/29/2023 >60 >60 mL/min Final    Comment:    (NOTE) Calculated using the CKD-EPI Creatinine Equation (2021)    eGFR  Date Value Ref Range Status  09/14/2021 80 > OR = 60 mL/min/1.26m2 Final         Passed - Completed PHQ-2 or PHQ-9 in the last 360 days      Passed - Valid encounter within last 6 months    Recent Outpatient Visits           3 days ago Hypertension associated with type 2 diabetes mellitus [facility])   [place] [facility] Glenard Mire, MD   4 months ago Hypertension associated with type 2 diabetes mellitus [facility])   [place] [facility] Edie, Krichna, MD               metoprolol  succinate (TOPROL -XL) 50 MG 24 hr tablet 90 tablet 1    Sig: Take 1 tablet (50 mg total) by mouth daily.     Cardiovascular:  Beta Blockers Failed - 08/05/2023  1:52 PM      Failed - Last BP in normal range    BP Readings from Last 1 Encounters:  08/02/23 (!) 140/74         Passed - Last Heart Rate in normal range    Pulse Readings from Last 1 Encounters:  08/02/23 76          Passed - Valid encounter within last 6 months    Recent Outpatient Visits           3 days ago Hypertension associated with type 2 diabetes mellitus [facility])   [place] [facility] Glenard Mire, MD   4 months ago Hypertension associated with type 2 diabetes mellitus [facility])   [place] [facility] Sowles, Krichna, MD

## 2023-09-01 ENCOUNTER — Other Ambulatory Visit: Payer: Self-pay | Admitting: Family Medicine

## 2023-09-01 DIAGNOSIS — M150 Primary generalized (osteo)arthritis: Secondary | ICD-10-CM

## 2023-09-01 DIAGNOSIS — G894 Chronic pain syndrome: Secondary | ICD-10-CM

## 2023-09-02 ENCOUNTER — Other Ambulatory Visit: Payer: Self-pay | Admitting: Family Medicine

## 2023-09-02 DIAGNOSIS — G894 Chronic pain syndrome: Secondary | ICD-10-CM

## 2023-09-02 DIAGNOSIS — M150 Primary generalized (osteo)arthritis: Secondary | ICD-10-CM

## 2023-09-02 NOTE — Telephone Encounter (Unsigned)
 Copied from CRM #[id number]. Topic: Clinical - Medication Refill >> Sep 02, 2023 12:42 PM Zy'onna H wrote: Medication:  DULoxetine  (CYMBALTA ) 30 MG capsule DULoxetine  (CYMBALTA ) 60 MG capsule  *Please verify  with pt which Rx should be taken and send an order for it to the Pharmacy*  Has the patient contacted their pharmacy? Yes (Agent: If no, request that the patient contact the pharmacy for the refill. If patient does not wish to contact the pharmacy document the reason why and proceed with request.) (Agent: If yes, when and what did the pharmacy advise?)  This is the patient's preferred pharmacy:  [id number] [place], [place] - [address] AT [facility] OF [address] [place] [address] [place] [address] Phone: [phone] Fax: [phone]   Is this the correct pharmacy for this prescription? Yes If no, delete pharmacy and type the correct one.   Has the prescription been filled recently? Yes  Is the patient out of the medication? Yes (2 tablets left)  Pharmacy stated Rx - was denied   Has the patient been seen for an appointment in the last year OR does the patient have an upcoming appointment? Yes  Can we respond through MyChart? No  Agent: Please be advised that Rx refills may take up to 3 business days. We ask that you follow-up with your pharmacy.

## 2023-09-04 MED ORDER — DULOXETINE HCL 60 MG PO CPEP
60.0000 mg | ORAL_CAPSULE | ORAL | 0 refills | Status: DC
Start: 2023-09-04 — End: 2023-12-04

## 2023-09-04 NOTE — Telephone Encounter (Signed)
 Requested medication (s) are due for refill today: routing for review,   Requested medication (s) are on the active medication list: {Yes  Last refill:  08/02/23, no print  Future visit scheduled: yes  Notes to clinic:  routing for review, patient needs new Rx for Cymbalta  60MG .     Requested Prescriptions  Pending Prescriptions Disp Refills   DULoxetine  (CYMBALTA ) 30 MG capsule 30 capsule 0    Sig: Take 1 capsule (30 mg total) by mouth daily.     Psychiatry: Antidepressants - SNRI - duloxetine  Failed - 09/04/2023 12:05 PM      Failed - Last BP in normal range    BP Readings from Last 1 Encounters:  08/02/23 (!) 140/74         Passed - Cr in normal range and within 360 days    Creatinine  Date Value Ref Range Status  05/29/2023 0.71 0.44 - 1.00 mg/dL Final   Creat  Date Value Ref Range Status  09/14/2021 0.78 0.60 - 1.00 mg/dL Final   Creatinine, POC  Date Value Ref Range Status  10/11/2016 Neg mg/dL Final   Creatinine, Urine  Date Value Ref Range Status  11/29/2022 35 20 - 275 mg/dL Final         Passed - eGFR is 30 or above and within 360 days    GFR, Est African American  Date Value Ref Range Status  07/22/2019 74 > OR = 60 mL/min/1.54m2 Final   GFR, Est Non African American  Date Value Ref Range Status  07/22/2019 64 > OR = 60 mL/min/1.44m2 Final   GFR, Estimated  Date Value Ref Range Status  05/29/2023 >60 >60 mL/min Final    Comment:    (NOTE) Calculated using the CKD-EPI Creatinine Equation (2021)    eGFR  Date Value Ref Range Status  09/14/2021 80 > OR = 60 mL/min/1.76m2 Final         Passed - Completed PHQ-2 or PHQ-9 in the last 360 days      Passed - Valid encounter within last 6 months    Recent Outpatient Visits           1 month ago Hypertension associated with type 2 diabetes mellitus [facility])   [facility] Glenard Mire, MD   5 months ago Hypertension associated with type 2 diabetes mellitus [facility])   [facility] Sowles, Krichna, MD               DULoxetine  (CYMBALTA ) 60 MG capsule 90 capsule 0    Sig: Take 1 capsule (60 mg total) by mouth every morning.     Psychiatry: Antidepressants - SNRI - duloxetine  Failed - 09/04/2023 12:05 PM      Failed - Last BP in normal range    BP Readings from Last 1 Encounters:  08/02/23 (!) 140/74         Passed - Cr in normal range and within 360 days    Creatinine  Date Value Ref Range Status  05/29/2023 0.71 0.44 - 1.00 mg/dL Final   Creat  Date Value Ref Range Status  09/14/2021 0.78 0.60 - 1.00 mg/dL Final   Creatinine, POC  Date Value Ref Range Status  10/11/2016 Neg mg/dL Final   Creatinine, Urine  Date Value Ref Range Status  11/29/2022 35 20 - 275 mg/dL Final         Passed - eGFR is 30 or above and within 360 days  GFR, Est African American  Date Value Ref Range Status  07/22/2019 74 > OR = 60 mL/min/1.6m2 Final   GFR, Est Non African American  Date Value Ref Range Status  07/22/2019 64 > OR = 60 mL/min/1.45m2 Final   GFR, Estimated  Date Value Ref Range Status  05/29/2023 >60 >60 mL/min Final    Comment:    (NOTE) Calculated using the CKD-EPI Creatinine Equation (2021)    eGFR  Date Value Ref Range Status  09/14/2021 80 > OR = 60 mL/min/1.62m2 Final         Passed - Completed PHQ-2 or PHQ-9 in the last 360 days      Passed - Valid encounter within last 6 months    Recent Outpatient Visits           1 month ago Hypertension associated with type 2 diabetes mellitus [facility])   [facility] [place], Dorette, MD   5 months ago Hypertension associated with type 2 diabetes mellitus [facility])   [facility] Sowles, Krichna, MD

## 2023-09-19 ENCOUNTER — Other Ambulatory Visit: Payer: Self-pay | Admitting: Oncology

## 2023-09-19 ENCOUNTER — Telehealth: Payer: Self-pay | Admitting: *Deleted

## 2023-09-19 MED ORDER — ANASTROZOLE 1 MG PO TABS: 1.0000 mg | ORAL_TABLET | Freq: Every day | ORAL | 1 refills | Status: DC

## 2023-09-19 NOTE — Telephone Encounter (Signed)
 The patient states that she wants to go back on the 1 she had before which was anastrozole .  He states that she is having some joint pain and hot flashes and sometimes no energy.  She states that she wants the anastrozole  because it was better for her.  She wants it to be sent to [facility] in [place] and wants a 90-day supply.  I spoke with Dr. Babara and she says that that is okay to go back on anastrozole .  I did tell the doctor that you have some medicine from your PCP that helps with the joint pain and so she now feels like the anastrozole  will be better for her.  She is on the letrozole  she said that she gets a lot of hot flashes and she did not have that when she was on the anastrozole .  The doctor says that you could have it and it is a 90 days supply.  Now that it is already at the pharmacy for her.

## 2023-09-30 ENCOUNTER — Ambulatory Visit: Payer: Self-pay

## 2023-09-30 ENCOUNTER — Other Ambulatory Visit: Payer: Self-pay | Admitting: Family Medicine

## 2023-09-30 ENCOUNTER — Encounter: Payer: Self-pay | Admitting: Podiatry

## 2023-09-30 ENCOUNTER — Ambulatory Visit (INDEPENDENT_AMBULATORY_CARE_PROVIDER_SITE_OTHER): Admitting: Podiatry

## 2023-09-30 DIAGNOSIS — E119 Type 2 diabetes mellitus without complications: Secondary | ICD-10-CM | POA: Diagnosis not present

## 2023-09-30 DIAGNOSIS — B351 Tinea unguium: Secondary | ICD-10-CM | POA: Diagnosis not present

## 2023-09-30 DIAGNOSIS — M79675 Pain in left toe(s): Secondary | ICD-10-CM

## 2023-09-30 DIAGNOSIS — E1169 Type 2 diabetes mellitus with other specified complication: Secondary | ICD-10-CM

## 2023-09-30 DIAGNOSIS — M79674 Pain in right toe(s): Secondary | ICD-10-CM | POA: Diagnosis not present

## 2023-09-30 NOTE — Telephone Encounter (Signed)
 FYI Only or Action Required?: FYI only for provider.  Patient was last seen in primary care on 08/02/2023 by Glenard Mire, MD.  Called Nurse Triage reporting Cough.  Symptoms began several days ago. 2 days ago  Interventions attempted: Rest, hydration, or home remedies.  Symptoms are: stable.  Triage Disposition: Home Care  Patient/caregiver understands and will follow disposition?: Yes  Copied from CRM [id number]. Topic: Clinical - Medical Advice >> Sep 30, 2023  3:29 PM Stacy [name] wrote: Reason for CRM: Patient states she has a dry cough and her voice is hoarse, is looking for a recommendation on what she can take to help. Also wants to make sure whatever she takes it okay to take with her other medications. Patient can be reached at [phone] Reason for Disposition  Cough with cold symptoms (e.g., runny nose, postnasal drip, throat clearing)  Common cold with no complications  Answer Assessment - Initial Assessment Questions 1. ONSET: When did the cough begin?      2 days ago, Saturday evening, but felt throat getting scratchy  2. SEVERITY: How bad is the cough today?      Getting worse  3. SPUTUM: Describe the color of your sputum (e.g., none, dry cough; clear, white, yellow, green)     Dry cough  4. HEMOPTYSIS: Are you coughing up any blood? If Yes, ask: How much? (e.g., flecks, streaks, tablespoons, etc.)     No  5. DIFFICULTY BREATHING: Are you having difficulty breathing? If Yes, ask: How bad is it? (e.g., mild, moderate, severe)      No  6. FEVER: Do you have a fever? If Yes, ask: What is your temperature, how was it measured, and when did it start?     No  7. CARDIAC HISTORY: Do you have any history of heart disease? (e.g., heart attack, congestive heart failure)      HLD and HTN  8. LUNG HISTORY: Do you have any history of lung disease?  (e.g., pulmonary embolus, asthma, emphysema)     No  9. PE RISK FACTORS: Do you have a history  of blood clots? (or: recent major surgery, recent prolonged travel, bedridden)     No  10. OTHER SYMPTOMS: Do you have any other symptoms? (e.g., runny nose, wheezing, chest pain)       Hoarse voice, sinus headache  11. PREGNANCY: Is there any chance you are pregnant? When was your last menstrual period?       no 12. TRAVEL: Have you traveled out of the country in the last month? (e.g., travel history, exposures)       no  Answer Assessment - Initial Assessment Questions 1. DESCRIPTION: Describe your voice. (e.g., coarse, raspy, weaker, airy, scratchy, deeper)     Raspy voice  2. SEVERITY: How bad is it?     Getting  3. ONSET: When did the hoarseness begin?     1 days  4. COUGH: Is there a cough? If Yes, ask: How bad is it?     Dry cough  5. FEVER: Do you have a fever? If Yes, ask: What is your temperature, how was it measured, and when did it start?     No  6. ALLERGIES: Do you have any allergy symptoms? If Yes, ask: What are they? (e.g., nose stuffiness)     *No Answer* 7. IRRITANTS: Do you smoke? Have you been exposed to any irritating fumes? (e.g., smoke)     *No Answer* 8. CAUSE: What do you think  is causing the hoarseness?     *No Answer* 9. OTHER SYMPTOMS: Do you have any other symptoms? (e.g., breathing difficulty, fever, foreign body, lymph node swelling in neck, rash, sore throat, swallowing difficulty, weight loss)     *No Answer* 10. PREGNANCY: Is there any chance you are pregnant? When was your last menstrual period?       *No Answer*  Protocols used: Cough - Acute Non-Productive-A-AH, Hoarseness-A-AH, Common Cold-A-AH

## 2023-09-30 NOTE — Telephone Encounter (Unsigned)
 Copied from CRM [id number]. Topic: Clinical - Medication Refill >> Sep 30, 2023  3:27 PM Rachelle R wrote: Medication: rosuvastatin  (CRESTOR ) 5 MG tablet  Has the patient contacted their pharmacy? Yes, call dr  This is the patient's preferred pharmacy:  [id number] - [place], [place] - [address] AT [facility] [address] [place] [address] [place] [address] Phone: [phone] Fax: [phone]  Is this the correct pharmacy for this prescription? Yes If no, delete pharmacy and type the correct one.   Has the prescription been filled recently? No  Is the patient out of the medication? No  Has the patient been seen for an appointment in the last year OR does the patient have an upcoming appointment? Yes  Can we respond through MyChart? No  Agent: Please be advised that Rx refills may take up to 3 business days. We ask that you follow-up with your pharmacy.

## 2023-09-30 NOTE — Progress Notes (Signed)
  Subjective:  Patient ID: Stacy [name], female    DOB: December 28, 1945,  MRN: [id number]  Stacy [name] presents to clinic today for preventative diabetic foot care for painful thick toenails that are difficult to trim. Pain interferes with ambulation. Aggravating factors include wearing enclosed shoe gear. Pain is relieved with periodic professional debridement.  New problem(s): None.   PCP is Glenard Mire, MD. Stacy [name] 08/02/2023.  Allergies  Allergen Reactions   Altace [Ramipril] Swelling and Other (See Comments)    lips    Review of Systems: Negative except as noted in the HPI.  Objective: No changes noted in today's physical examination. There were no vitals filed for this visit. Stacy [name] is a pleasant 78 y.o. female in NAD. AAO x 3.  Vascular Examination: Capillary refill time immediate b/l. Vascular status intact b/l with palpable pedal pulses. Pedal hair diminished b/l. No pain with calf compression b/l. Skin temperature gradient WNL b/l. No cyanosis or clubbing b/l. No ischemia or gangrene noted b/l. Trace edema noted BLE. Varicosities present b/l.  Neurological Examination: Sensation grossly intact b/l with 10 gram monofilament. Vibratory sensation intact b/l.   Dermatological Examination: Pedal skin with normal turgor, texture and tone b/l.  No open wounds. No interdigital macerations.   Toenails 1-5 b/l thick, discolored, elongated with subungual debris and pain on dorsal palpation.   Musculoskeletal Examination: Muscle strength 5/5 to all lower extremity muscle groups bilaterally. No pain, crepitus or joint limitation noted with ROM bilateral LE. No gross bony deformities bilaterally. Utilizes cane for ambulation assistance.  Radiographs: None  Assessment/Plan: 1. Pain due to onychomycosis of toenails of both feet   2. Diabetes mellitus type 2, diet-controlled (HCC)   Patient was evaluated and treated. All patient's and/or POA's questions/concerns  addressed on today's visit. Toenails 1-5 debrided in length and girth without incident. Continue foot and shoe inspections daily. Monitor blood glucose per PCP/Endocrinologist's recommendations. Continue soft, supportive shoe gear daily. Report any pedal injuries to medical professional. Call office if there are any questions/concerns. -Patient/POA to call should there be question/concern in the interim.   Return in about 3 months (around 12/30/2023).  Delon LITTIE Merlin, DPM      [facility] LOCATION: [address], [place] [address]                   Office [phone]   [facility] LOCATION: [address] [place], [place] [address] Office [phone]

## 2023-10-01 ENCOUNTER — Ambulatory Visit: Payer: Self-pay

## 2023-10-01 NOTE — Telephone Encounter (Signed)
 Lvm asking pt to return call to schedule appt

## 2023-10-01 NOTE — Telephone Encounter (Signed)
 Asking if she can take flonase , zyrtec, loratadine  or other for her symptoms. This RN advised that flonase  and antihistamines are typically considered safe with her medical conditions, but a message will be sent to her provider to confirm. She is going to ask if anyone can drop off a covid test for her, advised to call back for virtual visit if positive.  Can be reached at [phone].  FYI Only or Action Required?: Action required by provider: clinical question for provider.  Patient was last seen in primary care on 08/02/2023 by Glenard Mire, MD.  Called Nurse Triage reporting Cough.  Symptoms began several days ago.  Interventions attempted: Rest, hydration, or home remedies.  Symptoms are: gradually worsening.  Triage Disposition: Discuss With PCP and Callback by Nurse Today (overriding Home Care)  Patient/caregiver understands and will follow disposition?:   Reason for Disposition  Cough  Cough with cold symptoms (e.g., runny nose, postnasal drip, throat clearing)  Answer Assessment - Initial Assessment Questions Cough and nasal congestion that began 09/28/23, causing hoarseness. Patient sounds like losing her voice.  1. ONSET: When did the cough begin?      09/28/23  2. SEVERITY: How bad is the cough today?      Moderate, able to sleep.  3. SPUTUM: Describe the color of your sputum (e.g., none, dry cough; clear, white, yellow, green)     States feels sputum but is unable to bring it up completely to spit it out  5. DIFFICULTY BREATHING: Are you having difficulty breathing? If Yes, ask: How bad is it? (e.g., mild, moderate, severe)      Denies  6. FEVER: Do you have a fever? If Yes, ask: What is your temperature, how was it measured, and when did it start?     States felt chilly at church, states husband did not think she felt warm  7. MEDICAL HISTORY: Do you have any history of heart disease? (e.g., heart attack, congestive heart failure)      DMII,  HTN, High cholesterol, states also on hormone replacement  Protocols used: Cough - Acute Productive-A-AH Copied from CRM #[id number]. Topic: Clinical - Medication Question >> Oct 01, 2023 10:14 AM Geneva B wrote: Reason for CRM: patient is calling having questions about taking a medication over the counter until her appointment  please call pt back [phone] (H)

## 2023-10-02 MED ORDER — ROSUVASTATIN CALCIUM 5 MG PO TABS: 5.0000 mg | ORAL_TABLET | ORAL | 0 refills | Status: DC

## 2023-10-02 NOTE — Telephone Encounter (Signed)
 Requested Prescriptions  Pending Prescriptions Disp Refills   rosuvastatin  (CRESTOR ) 5 MG tablet 36 tablet 0    Sig: Take 1 tablet (5 mg total) by mouth every Monday, Wednesday, and Friday.     Cardiovascular:  Antilipid - Statins 2 Failed - 10/02/2023  9:15 AM      Failed - Lipid Panel in normal range within the last 12 months    Cholesterol, Total  Date Value Ref Range Status  10/07/2014 174 100 - 199 mg/dL Final   Cholesterol  Date Value Ref Range Status  11/29/2022 146 <200 mg/dL Final   LDL Cholesterol (Calc)  Date Value Ref Range Status  11/29/2022 53 mg/dL (calc) Final    Comment:    Reference range: <100 . Desirable range <100 mg/dL for primary prevention;   <70 mg/dL for patients with CHD or diabetic patients  with > or = 2 CHD risk factors. SABRA LDL-C is now calculated using the Martin-Hopkins  calculation, which is a validated novel method providing  better accuracy than the Friedewald equation in the  estimation of LDL-C.  Gladis APPLETHWAITE et al. SANDREA. 7986;689(80): 2061-2068  (http://education.[employer].com/faq/FAQ164)    HDL  Date Value Ref Range Status  11/29/2022 80 > OR = 50 mg/dL Final  90/77/7983 83 >60 mg/dL Final    Comment:    According to ATP-III Guidelines, HDL-C >59 mg/dL is considered a negative risk factor for CHD.    Triglycerides  Date Value Ref Range Status  11/29/2022 54 <150 mg/dL Final         Passed - Cr in normal range and within 360 days    Creatinine  Date Value Ref Range Status  05/29/2023 0.71 0.44 - 1.00 mg/dL Final   Creat  Date Value Ref Range Status  09/14/2021 0.78 0.60 - 1.00 mg/dL Final   Creatinine, POC  Date Value Ref Range Status  10/11/2016 Neg mg/dL Final   Creatinine, Urine  Date Value Ref Range Status  11/29/2022 35 20 - 275 mg/dL Final         Passed - Patient is not pregnant      Passed - Valid encounter within last 12 months    Recent Outpatient Visits           2 months ago Hypertension  associated with type 2 diabetes mellitus [facility])   [facility] Glenard Mire, MD   6 months ago Hypertension associated with type 2 diabetes mellitus [facility])   [facility] Sowles, Krichna, MD

## 2023-10-02 NOTE — Progress Notes (Unsigned)
   There were no vitals taken for this visit.   Subjective:    Patient ID: Stacy [name], female    DOB: [birth year], 78 y.o.   MRN: [id number]  HPI: Stacy [name] is a 78 y.o. female presenting today with cough and congestion            08/02/2023   10:20 AM 04/11/2023    8:57 AM 03/29/2023   11:22 AM  Depression screen PHQ 2/9  Decreased Interest 0 0 0  Down, Depressed, Hopeless 0 0 0  PHQ - 2 Score 0 0 0  Altered sleeping  0 0  Tired, decreased energy  0 0  Change in appetite  0 0  Feeling bad or failure about yourself   0 0  Trouble concentrating  0 0  Moving slowly or fidgety/restless  0 0  Suicidal thoughts  0 0  PHQ-9 Score  0 0  Difficult doing work/chores  Not difficult at all Not difficult at all    Relevant past medical, surgical, family and social history reviewed and updated as indicated. Interim medical history since our last visit reviewed. Allergies and medications reviewed and updated.  Review of Systems  Per HPI unless specifically indicated above     Objective:     There were no vitals taken for this visit.  {Vitals History (Optional):23777} Wt Readings from Last 3 Encounters:  08/02/23 203 lb 11.2 oz (92.4 kg)  06/21/23 203 lb 4.8 oz (92.2 kg)  05/29/23 203 lb 4.8 oz (92.2 kg)    Physical Exam   Results for orders placed or performed in visit on 08/02/23  POCT glycosylated hemoglobin (Hb A1C)   Collection Time: 08/02/23 10:31 AM  Result Value Ref Range   Hemoglobin A1C 6.1 (A) 4.0 - 5.6 %   HbA1c POC (<> result, manual entry)     HbA1c, POC (prediabetic range)     HbA1c, POC (controlled diabetic range)     {Labs (Optional):23779}       Assessment & Plan:   Problem List Items Addressed This Visit   None    Assessment and Plan         Follow up plan: No follow-ups on file.

## 2023-10-03 ENCOUNTER — Encounter: Payer: Self-pay | Admitting: Nurse Practitioner

## 2023-10-03 ENCOUNTER — Ambulatory Visit (INDEPENDENT_AMBULATORY_CARE_PROVIDER_SITE_OTHER): Admitting: Nurse Practitioner

## 2023-10-03 VITALS — BP 124/78 | HR 82 | Temp 98.1°F | Resp 18 | Ht 67.0 in | Wt 212.6 lb

## 2023-10-03 DIAGNOSIS — J3489 Other specified disorders of nose and nasal sinuses: Secondary | ICD-10-CM | POA: Diagnosis not present

## 2023-10-03 DIAGNOSIS — R051 Acute cough: Secondary | ICD-10-CM | POA: Diagnosis not present

## 2023-10-03 LAB — POC COVID19/FLU A&B COMBO
Covid Antigen, POC: NEGATIVE
Influenza A Antigen, POC: NEGATIVE
Influenza B Antigen, POC: NEGATIVE

## 2023-10-03 MED ORDER — BENZONATATE 100 MG PO CAPS: 100.0000 mg | ORAL_CAPSULE | Freq: Two times a day (BID) | ORAL | 0 refills | Status: DC | PRN

## 2023-10-03 MED ORDER — LORATADINE 10 MG PO TABS: 10.0000 mg | ORAL_TABLET | Freq: Every day | ORAL | 11 refills | Status: DC

## 2023-10-03 MED ORDER — PREDNISONE 10 MG (21) PO TBPK: ORAL_TABLET | ORAL | 0 refills | Status: DC

## 2023-10-04 ENCOUNTER — Telehealth: Payer: Self-pay | Admitting: Family Medicine

## 2023-10-04 NOTE — Telephone Encounter (Signed)
 Copied from CRM #[id number]. Topic: General - Other >> Oct 04, 2023  2:36 PM Avram MATSU wrote: Reason for CRM: pt want to let the provider know, she is doing better and will start taking the medication that was sent in soon

## 2023-10-15 NOTE — Progress Notes (Signed)
 Stacy [name]                                          MRN: [id number]   10/15/2023   The [facility] Quality Team Specialist reviewed this patient medical record for the purposes of chart review for care gap closure. The following were reviewed: chart review for care gap closure-kidney health evaluation for diabetes:eGFR  and uACR.    VBCI Quality Team

## 2023-10-21 NOTE — Progress Notes (Signed)
 Solara Goodchild                                          MRN: [id number]   10/21/2023   The VBCI Quality Team Specialist reviewed this patient medical record for the purposes of chart review for care gap closure. The following were reviewed: abstraction for care gap closure-controlling blood pressure.    VBCI Quality Team

## 2023-11-05 ENCOUNTER — Ambulatory Visit: Admitting: Family Medicine

## 2023-11-23 DIAGNOSIS — M79675 Pain in left toe(s): Secondary | ICD-10-CM | POA: Diagnosis not present

## 2023-11-23 DIAGNOSIS — M7989 Other specified soft tissue disorders: Secondary | ICD-10-CM | POA: Diagnosis not present

## 2023-11-27 ENCOUNTER — Ambulatory Visit
Admission: RE | Admit: 2023-11-27 | Discharge: 2023-11-27 | Disposition: A | Discharge status: Home / Self Care | Source: Ambulatory Visit | Attending: Oncology | Admitting: Oncology

## 2023-11-27 DIAGNOSIS — Z1231 Encounter for screening mammogram for malignant neoplasm of breast: Secondary | ICD-10-CM | POA: Diagnosis not present

## 2023-11-27 DIAGNOSIS — Z853 Personal history of malignant neoplasm of breast: Secondary | ICD-10-CM | POA: Diagnosis not present

## 2023-11-27 DIAGNOSIS — C50919 Malignant neoplasm of unspecified site of unspecified female breast: Secondary | ICD-10-CM

## 2023-12-02 ENCOUNTER — Other Ambulatory Visit: Payer: Self-pay | Admitting: Family Medicine

## 2023-12-02 DIAGNOSIS — M150 Primary generalized (osteo)arthritis: Secondary | ICD-10-CM

## 2023-12-02 DIAGNOSIS — G894 Chronic pain syndrome: Secondary | ICD-10-CM

## 2023-12-04 ENCOUNTER — Encounter: Payer: Self-pay | Admitting: Oncology

## 2023-12-04 ENCOUNTER — Inpatient Hospital Stay (HOSPITAL_BASED_OUTPATIENT_CLINIC_OR_DEPARTMENT_OTHER): Admitting: Oncology

## 2023-12-04 ENCOUNTER — Inpatient Hospital Stay: Attending: Oncology

## 2023-12-04 VITALS — BP 153/78 | HR 77 | Temp 96.1°F | Wt 204.0 lb

## 2023-12-04 DIAGNOSIS — Z823 Family history of stroke: Secondary | ICD-10-CM | POA: Diagnosis not present

## 2023-12-04 DIAGNOSIS — Z87891 Personal history of nicotine dependence: Secondary | ICD-10-CM | POA: Diagnosis not present

## 2023-12-04 DIAGNOSIS — E785 Hyperlipidemia, unspecified: Secondary | ICD-10-CM | POA: Insufficient documentation

## 2023-12-04 DIAGNOSIS — G8929 Other chronic pain: Secondary | ICD-10-CM | POA: Diagnosis not present

## 2023-12-04 DIAGNOSIS — N951 Menopausal and female climacteric states: Secondary | ICD-10-CM | POA: Insufficient documentation

## 2023-12-04 DIAGNOSIS — C50212 Malignant neoplasm of upper-inner quadrant of left female breast: Secondary | ICD-10-CM | POA: Insufficient documentation

## 2023-12-04 DIAGNOSIS — M25572 Pain in left ankle and joints of left foot: Secondary | ICD-10-CM | POA: Diagnosis not present

## 2023-12-04 DIAGNOSIS — Z1732 Human epidermal growth factor receptor 2 negative status: Secondary | ICD-10-CM | POA: Insufficient documentation

## 2023-12-04 DIAGNOSIS — C50919 Malignant neoplasm of unspecified site of unspecified female breast: Secondary | ICD-10-CM

## 2023-12-04 DIAGNOSIS — Z79811 Long term (current) use of aromatase inhibitors: Secondary | ICD-10-CM | POA: Diagnosis not present

## 2023-12-04 DIAGNOSIS — Z8051 Family history of malignant neoplasm of kidney: Secondary | ICD-10-CM | POA: Diagnosis not present

## 2023-12-04 DIAGNOSIS — M2550 Pain in unspecified joint: Secondary | ICD-10-CM

## 2023-12-04 DIAGNOSIS — Z8261 Family history of arthritis: Secondary | ICD-10-CM | POA: Insufficient documentation

## 2023-12-04 DIAGNOSIS — Z170 Estrogen receptor positive status [ER+]: Secondary | ICD-10-CM | POA: Diagnosis not present

## 2023-12-04 DIAGNOSIS — Z1721 Progesterone receptor positive status: Secondary | ICD-10-CM | POA: Diagnosis not present

## 2023-12-04 DIAGNOSIS — Z800 Family history of malignant neoplasm of digestive organs: Secondary | ICD-10-CM | POA: Insufficient documentation

## 2023-12-04 DIAGNOSIS — M25562 Pain in left knee: Secondary | ICD-10-CM | POA: Insufficient documentation

## 2023-12-04 DIAGNOSIS — Z9012 Acquired absence of left breast and nipple: Secondary | ICD-10-CM | POA: Diagnosis not present

## 2023-12-04 DIAGNOSIS — R232 Flushing: Secondary | ICD-10-CM | POA: Diagnosis not present

## 2023-12-04 DIAGNOSIS — I10 Essential (primary) hypertension: Secondary | ICD-10-CM | POA: Insufficient documentation

## 2023-12-04 DIAGNOSIS — Z803 Family history of malignant neoplasm of breast: Secondary | ICD-10-CM | POA: Diagnosis not present

## 2023-12-04 DIAGNOSIS — Z79899 Other long term (current) drug therapy: Secondary | ICD-10-CM | POA: Insufficient documentation

## 2023-12-04 LAB — CMP (CANCER CENTER ONLY)
ALT: 22 U/L (ref 0–44)
AST: 25 U/L (ref 15–41)
Albumin: 4.2 g/dL (ref 3.5–5.0)
Alkaline Phosphatase: 57 U/L (ref 38–126)
Anion gap: 9 (ref 5–15)
BUN: 19 mg/dL (ref 8–23)
CO2: 27 mmol/L (ref 22–32)
Calcium: 9.5 mg/dL (ref 8.9–10.3)
Chloride: 100 mmol/L (ref 98–111)
Creatinine: 0.83 mg/dL (ref 0.44–1.00)
GFR, Estimated: >60 mL/min (ref >60)
Glucose, Bld: 108 mg/dL — ABNORMAL HIGH (ref 70–99)
Potassium: 3.9 mmol/L (ref 3.5–5.1)
Sodium: 136 mmol/L (ref 135–145)
Total Bilirubin: 0.8 mg/dL (ref 0.0–1.2)
Total Protein: 7.7 g/dL (ref 6.5–8.1)

## 2023-12-04 LAB — CBC WITH DIFFERENTIAL (CANCER CENTER ONLY)
Abs Immature Granulocytes: 0.01 K/uL (ref 0.00–0.07)
Basophils Absolute: 0 K/uL (ref 0.0–0.1)
Basophils Relative: 1 %
Eosinophils Absolute: 0.3 K/uL (ref 0.0–0.5)
Eosinophils Relative: 7 %
HCT: 40.4 % (ref 36.0–46.0)
Hemoglobin: 12.9 g/dL (ref 12.0–15.0)
Immature Granulocytes: 0 %
Lymphocytes Relative: 38 %
Lymphs Abs: 1.6 K/uL (ref 0.7–4.0)
MCH: 27.9 pg (ref 26.0–34.0)
MCHC: 31.9 g/dL (ref 30.0–36.0)
MCV: 87.4 fL (ref 80.0–100.0)
Monocytes Absolute: 0.4 K/uL (ref 0.1–1.0)
Monocytes Relative: 9 %
Neutro Abs: 2 K/uL (ref 1.7–7.7)
Neutrophils Relative %: 45 %
Platelet Count: 220 K/uL (ref 150–400)
RBC: 4.62 MIL/uL (ref 3.87–5.11)
RDW: 14.6 % (ref 11.5–15.5)
WBC Count: 4.3 K/uL (ref 4.0–10.5)
nRBC: 0 % (ref 0.0–0.2)

## 2023-12-04 MED ORDER — ANASTROZOLE 1 MG PO TABS: 1.0000 mg | ORAL_TABLET | Freq: Every day | ORAL | 1 refills | Status: DC

## 2023-12-04 NOTE — Telephone Encounter (Signed)
 Requested Prescriptions  Pending Prescriptions Disp Refills   DULoxetine  (CYMBALTA ) 60 MG capsule [Pharmacy Med Name: DULOXETINE  DR 60MG  CAPSULES] 90 capsule 0    Sig: TAKE 1 CAPSULE(60 MG) BY MOUTH EVERY MORNING     Psychiatry: Antidepressants - SNRI - duloxetine  Failed - 12/04/2023 12:30 PM      Failed - Last BP in normal range    BP Readings from Last 1 Encounters:  12/04/23 (!) 153/78         Passed - Cr in normal range and within 360 days    Creatinine  Date Value Ref Range Status  12/04/2023 0.83 0.44 - 1.00 mg/dL Final   Creat  Date Value Ref Range Status  09/14/2021 0.78 0.60 - 1.00 mg/dL Final   Creatinine, POC  Date Value Ref Range Status  10/11/2016 Neg mg/dL Final   Creatinine, Urine  Date Value Ref Range Status  11/29/2022 35 20 - 275 mg/dL Final         Passed - eGFR is 30 or above and within 360 days    GFR, Est African American  Date Value Ref Range Status  07/22/2019 74 > OR = 60 mL/min/1.8m2 Final   GFR, Est Non African American  Date Value Ref Range Status  07/22/2019 64 > OR = 60 mL/min/1.9m2 Final   GFR, Estimated  Date Value Ref Range Status  12/04/2023 >60 >60 mL/min Final    Comment:    (NOTE) Calculated using the CKD-EPI Creatinine Equation (2021)    eGFR  Date Value Ref Range Status  09/14/2021 80 > OR = 60 mL/min/1.30m2 Final         Passed - Completed PHQ-2 or PHQ-9 in the last 360 days      Passed - Valid encounter within last 6 months    Recent Outpatient Visits           2 months ago Acute cough   [facility] Gareth Mliss FALCON, FNP   4 months ago Hypertension associated with type 2 diabetes mellitus [facility])   [facility] Glenard Mire, MD   8 months ago Hypertension associated with type 2 diabetes mellitus [facility])   [facility] Sowles, Krichna, MD

## 2023-12-04 NOTE — Assessment & Plan Note (Signed)
 Likely due to osteoarthritis, pre-existing, worsen with AI

## 2023-12-04 NOTE — Assessment & Plan Note (Addendum)
 Left breast invasive carcinoma, ER+. PR+. HER2- s/p mastectomy,  pT1c pN0, Oncotype DX 17   No role for adjuvant chemotherapy  Labs are reviewed and discussed with patient. Continue letrozole  2.5mg  daily. She is able to tolerate with manageable hot flashes Annual right screening mammogram - Nov 2025 results were reviewed.

## 2023-12-04 NOTE — Assessment & Plan Note (Signed)
 Nov 2024  DEXA -normal bone density.  Recommend calcium and vitamin D supplementation.

## 2023-12-04 NOTE — Progress Notes (Signed)
 Hematology/Oncology Progress note [phone] [phone]        CHIEF COMPLAINTS/PURPOSE OF CONSULTATION:  Left breast invasive carcinoma  ASSESSMENT & PLAN:   Cancer Staging  Invasive carcinoma of breast (HCC) Staging form: Breast, AJCC 8th Edition - Clinical: Stage IA (cT1c, cN0, cM0, G2, ER+, PR+, HER2-) - Signed by Babara Call, MD on 01/16/2022 - Pathologic stage from 01/31/2022: Stage IA (pT1c, pN0, cM0, G2, ER+, PR+, HER2-, Oncotype DX score: 17) - Signed by Babara Call, MD on 02/21/2022   Invasive carcinoma of breast (HCC) Left breast invasive carcinoma, ER+. PR+. HER2- s/p mastectomy,  pT1c pN0, Oncotype DX 17   No role for adjuvant chemotherapy  Labs are reviewed and discussed with patient. Continue letrozole  2.5mg  daily. She is able to tolerate with manageable hot flashes Annual right screening mammogram - Nov 2025 results were reviewed.      Aromatase inhibitor use Nov 2024  DEXA -normal bone density.  Recommend calcium  and vitamin D  supplementation.   Arthralgia Likely due to osteoarthritis, pre-existing, worsen with AI  Orders Placed This Encounter  Procedures   CBC with Differential (Cancer Center Only)    Standing Status:   Future    Expected Date:   06/02/2024    Expiration Date:   08/31/2024   CMP (Cancer Center only)    Standing Status:   Future    Expected Date:   06/02/2024    Expiration Date:   08/31/2024   Follow-up 6 months  All questions were answered. The patient knows to call the clinic with any problems, questions or concerns.  Call Babara, MD, PhD [facility] Hematology Oncology 12/04/2023    HISTORY OF PRESENTING ILLNESS:  Stacy [name] 78 y.o. female presents to establish care for Stage I left breast invasive carcinoma I have reviewed her chart and materials related to her cancer extensively  Summary of oncologic history is as follows: Oncology History  Invasive carcinoma of breast (HCC)  11/21/2021 Mammogram   Bilateral  screening mammogram showed In the left breast, a possible mass warrants further evaluation. In the right breast, no findings suspicious for malignancy.   12/14/2021 Mammogram   Left unilateral diagnostic mammogram/ ultrasound showed Suspicious 1.2 cm mass over the 10 o'clock position of the left breast 4 cm from the nipple. No pathologically enlarged Left axilla lymph node    01/12/2022 Initial Diagnosis   Invasive carcinoma of breast   -Left breast lesion ultrasound-guided biopsy showed invasive mammary carcinoma with mucinous features, grade 2, DCIS negative, LVI negative ER 90%, PR 20%, HER2 IHC 2+ equivocal, FISH negative.  Menarche at age of [age] First live birth at age of 18 OCP use: 5 years Menopausal status: Postmenopausal, LMP in 69s. 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 No pathogenic variants identified on the [facility] Multi-Cancer+RNA panel. The report date is 02/04/2022.  The Multi-Cancer + RNA Panel offered by [facility] includes sequencing and/or deletion/duplication analysis of the following 70 genes:  AIP*, ALK, APC*, ATM*, AXIN2*, BAP1*, BARD1*, BLM*, BMPR1A*, BRCA1*,  BRCA2*, BRIP1*, CDC73*, CDH1*, CDK4, CDKN1B*, CDKN2A, CHEK2*, CTNNA1*, DICER1*, EPCAM, EGFR, FH*, FLCN*, GREM1, HOXB13, KIT, LZTR1, MAX*, MBD4, MEN1*, MET, MITF, MLH1*, MSH2*, MSH3*, MSH6*, MUTYH*, NF1*, NF2*, NTHL1*, PALB2*, PDGFRA, PMS2*, POLD1*, POLE*, POT1*, PRKAR1A*, PTCH1*, PTEN*, RAD51C*, RAD51D*, RB1*, RET, SDHA*,  SDHAF2*, SDHB*, SDHC*, SDHD*, SMAD4*, SMARCA4*, SMARCB1*, SMARCE1*, STK11*, SUFU*, TMEM127*, TP53*, TSC1*, TSC2*, VHL*. RNA analysis is performed for * genes.   01/31/2022 Surgery   S/p left simple mastectomy and SLNB  Invasive mammary carcinoma with focal mucinous features. Grade 2, LVI-, All margins negative for invasive carcinoma.  Four sentinel  lymph nodes negative for malignancy  pT1c pN0    01/31/2022 Cancer Staging   Staging form: Breast, AJCC 8th Edition - Pathologic stage from 01/31/2022: Stage IA (pT1c, pN0, cM0, G2, ER+, PR+, HER2-, Oncotype DX score: 17) - Signed by Babara Call, MD on 02/21/2022 Stage prefix: Initial diagnosis Multigene prognostic tests performed: Oncotype DX Recurrence score range: Greater than or equal to 11 Histologic grading system: 3 grade system       INTERVAL HISTORY [name] is a 78 y.o. female who has above history reviewed by me today presents for follow up visit for Stage IA left breast invasive carcinoma, s/p left simple mastectomy and SLNB Previously, she has joint pain with arimidex  due to joint pain. Tried 2 pills of Aromasin  and felt mood swing. Did not  tolerate letrozole  2.5 mg daily.  She is now back on Arimidex  1mg  daily. She has chronic joint pain and hot flashes. She is coping with the symptoms.  Pain in the left knee and ankle, pre-existing prior to AI       MEDICAL HISTORY:  Past Medical History:  Diagnosis Date   Allergy    Arthritis    Cancer (HCC)    GERD (gastroesophageal reflux disease)    Hyperlipidemia    Hypertension    Neoplasm of uncertain behavior of breast 2007   Pre-diabetes    Vertigo     SURGICAL HISTORY: Past Surgical History:  Procedure Laterality Date   BREAST BIOPSY Right 2015   3 stereo biopsies. 2 benign. one was radial scar, no excision done for radial scar   BREAST BIOPSY Left 07/16/2017   BIPHASIC STROMAL AND EPITHELIAL LESION./ Dr Dessa   BREAST BIOPSY Left 08/23/2020   us  bx of mass  5:00 2cmfn, coil marker, FIBROEPITHELIAL LESION WITH INTRACANALICULAR GROWTH PATTERN AND VARIABLE STROMAL HYPERCELLULARITY   BREAST BIOPSY Left 01/02/2022   us  bx path pending 10:00 ribbon clip   BREAST BIOPSY Left 01/02/2022   US  LT BREAST BX W LOC DEV 1ST LESION IMG BX SPEC US  GUIDE 01/02/2022 [facility]   BREAST EXCISIONAL BIOPSY Left 1968   x 3 per pt   BREAST EXCISIONAL BIOPSY Right 2007   benign   BUNIONECTOMY Left    COLONOSCOPY  2011   DR,ISHAKIS   EXCISION OF BREAST LESION Left 10/07/2020   Procedure: EXCISION OF BREAST LESION;  Surgeon: Dessa Reyes ORN, MD;  Location: [facility];  Service: General;  Laterality: Left; FIBROEPITHELIAL LESION WITH NTRACANALICULAR GROWTH PATTERN AND VARIABLE STROMAL   HAMMER TOE SURGERY Left    2nd toe   HEEL SPUR SURGERY Left 12/15/2014   Dr. Alona at [facility]   HEEL SPUR SURGERY Right    MASTECTOMY W/ SENTINEL NODE BIOPSY Left 01/31/2022   Procedure: MASTECTOMY WITH SENTINEL LYMPH NODE BIOPSY;  Surgeon: Rodolph Romano, MD;  Location: [facility];  Service: General;  Laterality: Left;   TOTAL  KNEE ARTHROPLASTY Left 06/08/2021   Procedure: TOTAL KNEE ARTHROPLASTY;  Surgeon: Kathlynn Sharper, MD;  Location: [facility];  Service: Orthopedics;  Laterality: Left;    SOCIAL HISTORY: Social History   Socioeconomic History   Marital status: Married    Spouse name: Richard   Number of children: 1   Years of education: Not on file   Highest education level: 12th grade  Occupational History   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  Tobacco Use   Smoking status: Former    Current packs/day: 0.00    Average packs/day: 0.3 packs/day for 2.0 years (0.5 ttl pk-yrs)    Types: Cigarettes    Start date: 10    Quit date: 1974    Years since quitting: 51.9   Smokeless tobacco: Never   Tobacco comments:    smoking cessation materials not required  Vaping Use   Vaping status: Never Used  Substance and Sexual Activity   Alcohol  use: No    Alcohol/week: 0.0 standard drinks of alcohol   Drug use: No   Sexual activity: Not Currently    Comment: Husband has prostate issues  Other Topics Concern   Not on file  Social History Narrative   Not on file   Social Drivers of Health   Financial Resource Strain: Low Risk  (11/23/2023)   Received from [facility]   Overall Financial Resource Strain (CARDIA)    Difficulty of Paying Living Expenses: Not very hard  Food Insecurity: No Food Insecurity (11/23/2023)   Received from [facility]   Hunger Vital Sign    Within the past 12 months, you worried that your food would run out before you got the money to buy more.: Never true    Within the past 12 months, the food you bought just didn't last and you didn't have money to get more.: Never true  Transportation Needs: No Transportation Needs (11/23/2023)   Received from [facility] - Transportation    In the past 12 months, has lack of transportation kept you from medical appointments or from getting medications?: No    Lack of Transportation (Non-Medical): No  Physical Activity: Insufficiently Active (04/11/2023)   Exercise Vital Sign    Days of Exercise per Week: 1 day    Minutes of Exercise per Session: 10 min  Stress: No Stress Concern Present (04/11/2023)   [employer] of Occupational Health - Occupational Stress Questionnaire    Feeling of Stress : Not at all  Social Connections: Socially Integrated (04/11/2023)   Social Connection and Isolation Panel    Frequency of Communication with Friends and Family: More than three times a week    Frequency of Social Gatherings with Friends and Family: Once a week    Attends Religious Services: More than 4 times per year    Active Member of [employer] or Organizations: Yes    Attends Banker Meetings: 1 to 4 times per year    Marital Status: Married  Catering Manager Violence: Not At Risk (04/11/2023)    Humiliation, Afraid, Rape, and Kick questionnaire    Fear of Current or Ex-Partner: No    Emotionally Abused: No    Physically Abused: No    Sexually Abused: No    FAMILY HISTORY: Family History  Problem Relation Age of Onset   Stroke Mother    Arthritis Mother    Breast cancer Sister 55  Breast cancer Sister    Cancer Brother 26       kidney   Kidney cancer Brother    Cancer Maternal Grandmother        Gallbladder   Stroke Paternal Grandmother     ALLERGIES:  is allergic to altace [ramipril].  MEDICATIONS:  Current Outpatient Medications  Medication Sig Dispense Refill   acetaminophen  (TYLENOL ) 500 MG tablet Take 500-1,000 mg by mouth every 6 (six) hours as needed (pain.).     anastrozole  (ARIMIDEX ) 1 MG tablet Take 1 tablet (1 mg total) by mouth daily. 90 tablet 1   aspirin  81 MG EC tablet Take 81 mg by mouth in the morning.     benzonatate  (TESSALON ) 100 MG capsule Take 1 capsule (100 mg total) by mouth 2 (two) times daily as needed for cough. 20 capsule 0   Cholecalciferol  (VITAMIN D -3 PO) Take 2,000 Units by mouth in the morning.     CINNAMON PO Take 1,000 mg by mouth in the morning.     DULoxetine  (CYMBALTA ) 60 MG capsule Take 1 capsule (60 mg total) by mouth every morning. 90 capsule 0   Elastic Bandages & Supports (THUMB BRACE) MISC 1 each by Does not apply route daily. Left thumb spica 1 each 0   FISH OIL-BORAGE-FLAX-SAFFLOWER PO Take 1 capsule by mouth in the morning.     metoprolol  succinate (TOPROL -XL) 50 MG 24 hr tablet Take 1 tablet (50 mg total) by mouth daily. 90 tablet 1   Multiple Vitamin (MULTIVITAMIN) capsule Take 1 capsule by mouth daily.     rosuvastatin  (CRESTOR ) 5 MG tablet Take 1 tablet (5 mg total) by mouth every Monday, Wednesday, and Friday. 36 tablet 0   triamterene -hydrochlorothiazide  (MAXZIDE -25) 37.5-25 MG tablet Take 1 tablet by mouth daily. 90 tablet 1   diclofenac  Sodium (VOLTAREN ) 1 % GEL Apply 2 g topically 4 (four) times daily.  (Patient not taking: Reported on 12/04/2023)     predniSONE  (STERAPRED UNI-PAK 21 TAB) 10 MG (21) TBPK tablet Use as directed. (Patient not taking: Reported on 12/04/2023) 21 each 0   traZODone  (DESYREL ) 50 MG tablet Take 0.5-1 tablets (25-50 mg total) by mouth at bedtime as needed for sleep. (Patient not taking: Reported on 12/04/2023) 90 tablet 0   No current facility-administered medications for this visit.    Review of Systems  Constitutional:  Negative for appetite change, chills, fatigue and fever.  HENT:   Negative for hearing loss and voice change.   Eyes:  Negative for eye problems.  Respiratory:  Negative for chest tightness and cough.   Cardiovascular:  Negative for chest pain.  Gastrointestinal:  Negative for abdominal distention, abdominal pain and blood in stool.  Endocrine: Positive for hot flashes.  Genitourinary:  Negative for difficulty urinating and frequency.   Musculoskeletal:  Positive for arthralgias.  Skin:  Negative for itching and rash.  Neurological:  Negative for extremity weakness.  Hematological:  Negative for adenopathy.  Psychiatric/Behavioral:  Negative for confusion.      PHYSICAL EXAMINATION: ECOG PERFORMANCE STATUS: 0 - Asymptomatic  Vitals:   12/04/23 1123 12/04/23 1131  BP: (!) 171/76 (!) 153/78  Pulse: 77   Temp: (!) 96.1 F (35.6 C)   SpO2: 100%    Filed Weights   12/04/23 1123  Weight: 204 lb (92.5 kg)    Physical Exam Constitutional:      General: She is not in acute distress.    Appearance: She is not diaphoretic.  HENT:  Head: Normocephalic and atraumatic.  Eyes:     General: No scleral icterus. Cardiovascular:     Rate and Rhythm: Normal rate and regular rhythm.  Pulmonary:     Effort: Pulmonary effort is normal. No respiratory distress.     Breath sounds: No wheezing.  Abdominal:     General: There is no distension.     Palpations: Abdomen is soft.     Tenderness: There is no abdominal tenderness.   Musculoskeletal:        General: Normal range of motion.     Cervical back: Normal range of motion and neck supple.  Skin:    General: Skin is warm and dry.     Findings: No erythema.  Neurological:     Mental Status: She is alert and oriented to person, place, and time. Mental status is at baseline.     Cranial Nerves: No cranial nerve deficit.     Motor: No abnormal muscle tone.  Psychiatric:        Mood and Affect: Mood and affect normal.     LABORATORY DATA:  I have reviewed the data as listed    Latest Ref Rng & Units 12/04/2023   11:10 AM 05/29/2023   10:05 AM 11/26/2022    9:49 AM  CBC  WBC 4.0 - 10.5 K/uL 4.3  3.6  4.1   Hemoglobin 12.0 - 15.0 g/dL 87.0  87.9  87.0   Hematocrit 36.0 - 46.0 % 40.4  38.5  40.3   Platelets 150 - 400 K/uL 220  207  237       Latest Ref Rng & Units 12/04/2023   11:10 AM 05/29/2023   10:06 AM 11/29/2022    9:29 AM  CMP  Glucose 70 - 99 mg/dL 891  873    BUN 8 - 23 mg/dL 19  18    Creatinine 9.55 - 1.00 mg/dL 9.16  9.28    Sodium 864 - 145 mmol/L 136  137    Potassium 3.5 - 5.1 mmol/L 3.9  4.1  4.1   Chloride 98 - 111 mmol/L 100  102    CO2 22 - 32 mmol/L 27  27    Calcium  8.9 - 10.3 mg/dL 9.5  9.0    Total Protein 6.5 - 8.1 g/dL 7.7  7.3    Total Bilirubin 0.0 - 1.2 mg/dL 0.8  0.7    Alkaline Phos 38 - 126 U/L 57  51    AST 15 - 41 U/L 25  23    ALT 0 - 44 U/L 22  23       RADIOGRAPHIC STUDIES: I have personally reviewed the radiological images as listed and agreed with the findings in the report. MM 3D SCREENING MAMMOGRAM UNILATERAL RIGHT BREAST Result Date: 11/29/2023 CLINICAL DATA:  Screening. EXAM: DIGITAL SCREENING UNILATERAL RIGHT MAMMOGRAM WITH CAD AND TOMOSYNTHESIS TECHNIQUE: Right screening digital craniocaudal and mediolateral oblique mammograms were obtained. Right screening digital breast tomosynthesis was performed. The images were evaluated with computer-aided detection. COMPARISON:  Previous exam(s). ACR Breast  Density Category c: The breasts are heterogeneously dense, which may obscure small masses. FINDINGS: There are no findings suspicious for malignancy. IMPRESSION: No mammographic evidence of malignancy. A result letter of this screening mammogram will be mailed directly to the patient. RECOMMENDATION: Screening mammogram in one year. (Code:SM-B-01Y) BI-RADS CATEGORY  1: Negative. Electronically Signed   By: Alm Parkins M.D.   On: 11/29/2023 11:07

## 2023-12-10 ENCOUNTER — Ambulatory Visit: Admitting: Family Medicine

## 2023-12-19 ENCOUNTER — Other Ambulatory Visit: Payer: Self-pay

## 2023-12-19 MED ORDER — ANASTROZOLE 1 MG PO TABS: 1.0000 mg | ORAL_TABLET | Freq: Every day | ORAL | 1 refills | Status: DC

## 2023-12-24 ENCOUNTER — Other Ambulatory Visit: Payer: Self-pay

## 2023-12-24 ENCOUNTER — Telehealth: Payer: Self-pay | Admitting: *Deleted

## 2023-12-24 ENCOUNTER — Telehealth: Payer: Self-pay

## 2023-12-24 MED ORDER — LETROZOLE 2.5 MG PO TABS: 2.5000 mg | ORAL_TABLET | Freq: Every day | ORAL | 1 refills | Status: AC

## 2023-12-24 NOTE — Telephone Encounter (Signed)
 Pt called requesting to switch from Anastrozole  to Letrozole  due to cost. Pt was informed that she was previously on Letrozole  and was switched. Pt is aware and would like to proceed with switching to Letrozole .   New Rx for Letrozole  sent to [facility], per pt request.   [facility] and Anastrozole  rx was cancelled.

## 2023-12-24 NOTE — Telephone Encounter (Signed)
 Patient called and states her Anastrozole  prescription is more expensive.  She would like to see if Dr. Babara could order Letrozole  instead.  Message sent to Dr. Babara and her team.

## 2023-12-26 ENCOUNTER — Telehealth: Payer: Self-pay

## 2023-12-26 NOTE — Telephone Encounter (Signed)
 Another message left requesting patient call the office to provide more information.

## 2023-12-26 NOTE — Telephone Encounter (Signed)
 Patient called requesting a call from Dr. Layvonne team in regards to her medications.  Returned call but no answer, message left asking her to call the office to provide more information.

## 2023-12-30 ENCOUNTER — Ambulatory Visit: Admitting: Podiatry

## 2023-12-30 ENCOUNTER — Other Ambulatory Visit: Payer: Self-pay | Admitting: Family Medicine

## 2023-12-30 DIAGNOSIS — E1169 Type 2 diabetes mellitus with other specified complication: Secondary | ICD-10-CM

## 2023-12-30 DIAGNOSIS — Z91198 Patient's noncompliance with other medical treatment and regimen for other reason: Secondary | ICD-10-CM

## 2023-12-30 NOTE — Progress Notes (Signed)
 1. Failure to attend appointment with reason given    Appointment rescheduled by patient.

## 2024-01-02 ENCOUNTER — Ambulatory Visit: Admitting: Podiatry

## 2024-01-02 DIAGNOSIS — L600 Ingrowing nail: Secondary | ICD-10-CM | POA: Diagnosis not present

## 2024-01-02 LAB — HM DIABETES FOOT EXAM

## 2024-01-02 NOTE — Telephone Encounter (Signed)
 Requested Prescriptions  Pending Prescriptions Disp Refills   rosuvastatin  (CRESTOR ) 5 MG tablet [Pharmacy Med Name: ROSUVASTATIN  5MG  TABLETS] 36 tablet 0    Sig: TAKE 1 TABLET BY MOUTH EVERY MONDAY, WEDNESDAY, AND FRIDAY     Cardiovascular:  Antilipid - Statins 2 Failed - 01/02/2024  1:17 PM      Failed - Lipid Panel in normal range within the last 12 months    Cholesterol, Total  Date Value Ref Range Status  10/07/2014 174 100 - 199 mg/dL Final   Cholesterol  Date Value Ref Range Status  11/29/2022 146 <200 mg/dL Final   LDL Cholesterol (Calc)  Date Value Ref Range Status  11/29/2022 53 mg/dL (calc) Final    Comment:    Reference range: <100 . Desirable range <100 mg/dL for primary prevention;   <70 mg/dL for patients with CHD or diabetic patients  with > or = 2 CHD risk factors. SABRA LDL-C is now calculated using the Martin-Hopkins  calculation, which is a validated novel method providing  better accuracy than the Friedewald equation in the  estimation of LDL-C.  Gladis APPLETHWAITE et al. SANDREA. 7986;689(80): 2061-2068  (http://education.[facility].com/faq/FAQ164)    HDL  Date Value Ref Range Status  11/29/2022 80 > OR = 50 mg/dL Final  90/77/7983 83 >60 mg/dL Final    Comment:    According to ATP-III Guidelines, HDL-C >59 mg/dL is considered a negative risk factor for CHD.    Triglycerides  Date Value Ref Range Status  11/29/2022 54 <150 mg/dL Final         Passed - Cr in normal range and within 360 days    Creatinine  Date Value Ref Range Status  12/04/2023 0.83 0.44 - 1.00 mg/dL Final   Creat  Date Value Ref Range Status  09/14/2021 0.78 0.60 - 1.00 mg/dL Final   Creatinine, POC  Date Value Ref Range Status  10/11/2016 Neg mg/dL Final   Creatinine, Urine  Date Value Ref Range Status  11/29/2022 35 20 - 275 mg/dL Final         Passed - Patient is not pregnant      Passed - Valid encounter within last 12 months    Recent Outpatient Visits            3 months ago Acute cough   [facility] Gareth Mliss FALCON, FNP   5 months ago Hypertension associated with type 2 diabetes mellitus [facility])   [facility] Glenard Mire, MD   9 months ago Hypertension associated with type 2 diabetes mellitus [facility])   [facility] Sowles, Krichna, MD

## 2024-01-02 NOTE — Progress Notes (Signed)
 Subjective:  Patient ID: Stacy [name], female    DOB: 1945-10-13,  MRN: [id number]  Chief Complaint  Patient presents with   Nail Problem    78 y.o. female presents with the above complaint.  Patient presents with complaint of bilateral hallux medial border ingrown painful to touch is progressive and worsens with ambulation and shoe pressure patient would like to have removed has not seen MRIs prior to seeing me denies any other acute complaints   Review of Systems: Negative except as noted in the HPI. Denies N/V/F/Ch.  Past Medical History:  Diagnosis Date   Allergy    Arthritis    Cancer (HCC)    GERD (gastroesophageal reflux disease)    Hyperlipidemia    Hypertension    Neoplasm of uncertain behavior of breast 2007   Pre-diabetes    Vertigo    Current Medications[1]  Tobacco Use History[2]  Allergies[3] Objective:  There were no vitals filed for this visit. There is no height or weight on file to calculate BMI. Constitutional Well developed. Well nourished.  Vascular Dorsalis pedis pulses palpable bilaterally. Posterior tibial pulses palpable bilaterally. Capillary refill normal to all digits.  No cyanosis or clubbing noted. Pedal hair growth normal.  Neurologic Normal speech. Oriented to person, place, and time. Epicritic sensation to light touch grossly present bilaterally.  Dermatologic Painful ingrowing nail at medial nail borders of the hallux nail bilaterally. No other open wounds. No skin lesions.  Orthopedic: Normal joint ROM without pain or crepitus bilaterally. No visible deformities. No bony tenderness.   Radiographs: None Assessment:   1. Ingrown toenail of right foot   2. Ingrown left big toenail    Plan:  Patient was evaluated and treated and all questions answered.  Ingrown Nail, bilaterally -Patient elects to proceed with minor surgery to remove ingrown toenail removal today. Consent reviewed and signed by patient. -Ingrown nail  excised. See procedure note. -Educated on post-procedure care including soaking. Written instructions provided and reviewed. -Patient to follow up in 2 weeks for nail check.  Procedure: Excision of Ingrown Toenail Location: Bilateral 1st toe medial nail borders. Anesthesia: Lidocaine  1% plain; 1.5 mL and Marcaine  0.5% plain; 1.5 mL, digital block. Skin Prep: Betadine. Dressing: Silvadene; telfa; dry, sterile, compression dressing. Technique: Following skin prep, the toe was exsanguinated and a tourniquet was secured at the base of the toe. The affected nail border was freed, split with a nail splitter, and excised. Chemical matrixectomy was then performed with phenol and irrigated out with alcohol. The tourniquet was then removed and sterile dressing applied. Disposition: Patient tolerated procedure well. Patient to return in 2 weeks for follow-up.   Return in about 3 months (around 04/01/2024) for RFC .    [1]  Current Outpatient Medications:    acetaminophen  (TYLENOL ) 500 MG tablet, Take 500-1,000 mg by mouth every 6 (six) hours as needed (pain.)., Disp: , Rfl:    aspirin  81 MG EC tablet, Take 81 mg by mouth in the morning., Disp: , Rfl:    benzonatate  (TESSALON ) 100 MG capsule, Take 1 capsule (100 mg total) by mouth 2 (two) times daily as needed for cough., Disp: 20 capsule, Rfl: 0   Cholecalciferol  (VITAMIN D -3 PO), Take 2,000 Units by mouth in the morning., Disp: , Rfl:    CINNAMON PO, Take 1,000 mg by mouth in the morning., Disp: , Rfl:    diclofenac  Sodium (VOLTAREN ) 1 % GEL, Apply 2 g topically 4 (four) times daily. (Patient not taking: Reported on 12/04/2023),  Disp: , Rfl:    DULoxetine  (CYMBALTA ) 60 MG capsule, TAKE 1 CAPSULE(60 MG) BY MOUTH EVERY MORNING, Disp: 90 capsule, Rfl: 0   Elastic Bandages & Supports (THUMB BRACE) MISC, 1 each by Does not apply route daily. Left thumb spica, Disp: 1 each, Rfl: 0   FISH OIL-BORAGE-FLAX-SAFFLOWER PO, Take 1 capsule by mouth in the morning.,  Disp: , Rfl:    letrozole  (FEMARA ) 2.5 MG tablet, Take 1 tablet (2.5 mg total) by mouth daily., Disp: 90 tablet, Rfl: 1   metoprolol  succinate (TOPROL -XL) 50 MG 24 hr tablet, Take 1 tablet (50 mg total) by mouth daily., Disp: 90 tablet, Rfl: 1   Multiple Vitamin (MULTIVITAMIN) capsule, Take 1 capsule by mouth daily., Disp: , Rfl:    predniSONE  (STERAPRED UNI-PAK 21 TAB) 10 MG (21) TBPK tablet, Use as directed. (Patient not taking: Reported on 12/04/2023), Disp: 21 each, Rfl: 0   rosuvastatin  (CRESTOR ) 5 MG tablet, TAKE 1 TABLET BY MOUTH EVERY MONDAY, WEDNESDAY, AND FRIDAY, Disp: 36 tablet, Rfl: 0   traZODone  (DESYREL ) 50 MG tablet, Take 0.5-1 tablets (25-50 mg total) by mouth at bedtime as needed for sleep. (Patient not taking: Reported on 12/04/2023), Disp: 90 tablet, Rfl: 0   triamterene -hydrochlorothiazide  (MAXZIDE -25) 37.5-25 MG tablet, Take 1 tablet by mouth daily., Disp: 90 tablet, Rfl: 1 [2]  Social History Tobacco Use  Smoking Status Former   Current packs/day: 0.00   Average packs/day: 0.3 packs/day for 2.0 years (0.5 ttl pk-yrs)   Types: Cigarettes   Start date: 31   Quit date: 32   Years since quitting: 51.9  Smokeless Tobacco Never  Tobacco Comments   smoking cessation materials not required  [3]  Allergies Allergen Reactions   Altace [Ramipril] Swelling and Other (See Comments)    lips

## 2024-01-22 NOTE — ED Notes (Signed)
 [facility] Emergency Department Attestation Note    ED Clinical Impression    Final diagnoses:  Leg swelling (Primary)      ED Attending Physician Teaching Attestation    I supervised care provided by the resident. We have discussed the case, I have reviewed the note and I agree with the plan of treatment except as documented in my note.  I have also evaluated the patient.       ED Attending Note    ED Triage Vitals [01/20/24 1931]  Enc Vitals Group     BP (!) 171/82     Pulse 70     SpO2 Pulse      Resp 20     Temp 36.8 C (98.2 F)     Temp Source Temporal     SpO2 97 %     Weight 92.1 kg (203 lb)     Height      Head Circumference      Peak Flow      Pain Score      Pain Loc      Pain Education      Exclude from Growth Chart       See chart and resident physician documentation for details.    Additional Medical Decision Making and Disclaimers   I have reviewed the vital signs and the nursing notes. Labs and radiology results that were available during my care of the patient were independently reviewed by me and considered in my medical decision making.   I directly visualized and independently interpreted the EKG tracing.  I independently visualized the radiology images.  I reviewed the patient's prior medical records.  Portions of this record have been created using Scientist, clinical (histocompatibility and immunogenetics). Dictation errors have been sought, but may not have been identified and corrected.

## 2024-01-24 ENCOUNTER — Ambulatory Visit: Admitting: Family Medicine

## 2024-01-24 ENCOUNTER — Encounter: Payer: Self-pay | Admitting: Family Medicine

## 2024-01-24 VITALS — BP 146/80 | HR 79 | Resp 16 | Ht 67.0 in | Wt 204.4 lb

## 2024-01-24 DIAGNOSIS — Z23 Encounter for immunization: Secondary | ICD-10-CM

## 2024-01-24 DIAGNOSIS — R7989 Other specified abnormal findings of blood chemistry: Secondary | ICD-10-CM

## 2024-01-24 DIAGNOSIS — M7989 Other specified soft tissue disorders: Secondary | ICD-10-CM

## 2024-01-24 NOTE — Progress Notes (Signed)
 Name: Stacy [name]   MRN: [id number]    DOB: [birth year]   Date:01/24/2024       Progress Note  Subjective  Chief Complaint  Chief Complaint  Patient presents with   Leg Swelling    Bilateral legs   Discussed the use of AI scribe software for clinical note transcription with the patient, who gave verbal consent to proceed.  History of Present Illness Stacy [name] is a 79 year old female with venous insufficiency who presents with leg swelling.  She experienced leg swelling, prompting a visit to the emergency room on January 5th. An ultrasound ruled out deep vein thrombosis, and an EKG showed possible left atrial enlargement with normal sinus rhythm. No echocardiogram was performed, but a bedside ultrasound assessed cardiac function, however unable to see results   Her blood pressure was elevated in the 170s during the hospital visit. B-type natriuretic peptide levels were high. She did not experience shortness of breath or orthopnea. She resumed taking triamterene  hydrochlorothiazide  since she left the hospital  , which she had previously stopped due to urinary frequency. She also takes metoprolol  50 mg for blood pressure management.  The swelling persists, particularly in the left leg, which is more swollen than the right. She has a history of knee replacement on the left side. Both legs are red and swollen, with the left foot and ankle more affected. She uses compression stockings and a massager with pumps to manage the swelling, a gift from her niece.  Her past medical history includes venous insufficiency, previously evaluated by a vascular doctor. She has not experienced this degree of swelling before, and the current episode is more severe than previous ones.    Patient Active Problem List   Diagnosis Date Noted   Urge incontinence 08/02/2023   Primary osteoarthritis involving multiple joints 08/02/2023   Chronic pain syndrome 08/02/2023   Insomnia due to medical condition  08/02/2023   Aromatase inhibitor use 05/23/2022   Arthralgia 05/23/2022   Genetic testing 02/05/2022   Invasive carcinoma of breast (HCC) 01/16/2022   Goals of care, counseling/discussion 01/16/2022   Long term current use of anticoagulant therapy 06/09/2021   S/P TKR (total knee replacement) using cement, left 06/08/2021   Dyslipidemia associated with type 2 diabetes mellitus (HCC) 01/14/2020   Onychomycosis of multiple toenails with type 2 diabetes mellitus (HCC) 01/23/2019   Pain due to onychomycosis of toenails of both feet 10/06/2018   Abnormal EKG 12/10/2016   Radial scar of breast 05/30/2015   History of foot surgery 12/15/2014   Hypertension associated with type 2 diabetes mellitus (HCC) 10/07/2014   Seasonal allergies 10/07/2014   Vitamin D  deficiency 10/07/2014   Varicose veins 10/07/2014   Family history of breast cancer 05/12/2012    Past Surgical History:  Procedure Laterality Date   BREAST BIOPSY Right 2015   3 stereo biopsies. 2 benign. one was radial scar, no excision done for radial scar   BREAST BIOPSY Left 07/16/2017   BIPHASIC STROMAL AND EPITHELIAL LESION./ Dr Dessa   BREAST BIOPSY Left 08/23/2020   us  bx of mass 5:00 2cmfn, coil marker, FIBROEPITHELIAL LESION WITH INTRACANALICULAR GROWTH PATTERN AND VARIABLE STROMAL HYPERCELLULARITY   BREAST BIOPSY Left 01/02/2022   us  bx path pending 10:00 ribbon clip   BREAST BIOPSY Left 01/02/2022   US  LT BREAST BX W LOC DEV 1ST LESION IMG BX SPEC US  GUIDE 01/02/2022 [facility]   BREAST EXCISIONAL BIOPSY Left 1968   x 3 per pt  BREAST EXCISIONAL BIOPSY Right 2007   benign   BUNIONECTOMY Left    COLONOSCOPY  2011   DR,ISHAKIS   EXCISION OF BREAST LESION Left 10/07/2020   Procedure: EXCISION OF BREAST LESION;  Surgeon: Dessa Reyes ORN, MD;  Location: [facility];  Service: General;  Laterality: Left; FIBROEPITHELIAL LESION WITH NTRACANALICULAR GROWTH PATTERN AND VARIABLE STROMAL   HAMMER TOE SURGERY Left     2nd toe   HEEL SPUR SURGERY Left 12/15/2014   Dr. Alona at [facility]   HEEL SPUR SURGERY Right    MASTECTOMY W/ SENTINEL NODE BIOPSY Left 01/31/2022   Procedure: MASTECTOMY WITH SENTINEL LYMPH NODE BIOPSY;  Surgeon: Rodolph Romano, MD;  Location: [facility];  Service: General;  Laterality: Left;   TOTAL KNEE ARTHROPLASTY Left 06/08/2021   Procedure: TOTAL KNEE ARTHROPLASTY;  Surgeon: Kathlynn Sharper, MD;  Location: [facility];  Service: Orthopedics;  Laterality: Left;    Family History  Problem Relation Age of Onset   Stroke Mother    Arthritis Mother    Breast cancer Sister 37   Breast cancer Sister    Cancer Brother 25       kidney   Kidney cancer Brother    Cancer Maternal Grandmother        Gallbladder   Stroke Paternal Grandmother     Social History   Tobacco Use   Smoking status: Former    Current packs/day: 0.00    Average packs/day: 0.3 packs/day for 2.0 years (0.5 ttl pk-yrs)    Types: Cigarettes    Start date: 75    Quit date: 1974    Years since quitting: 52.0   Smokeless tobacco: Never   Tobacco comments:    smoking cessation materials not required  Substance Use Topics   Alcohol use: No    Alcohol/week: 0.0 standard drinks of alcohol    Current Medications[1]  Allergies[2]  I personally reviewed active problem list, medication list, allergies, family history with the patient/caregiver today.   ROS  Ten systems reviewed and is negative except as mentioned in HPI    Objective Physical Exam CONSTITUTIONAL: Patient appears well-developed and well-nourished. No distress. HEENT: Head atraumatic, normocephalic, neck supple. CARDIOVASCULAR: Normal rate, regular rhythm and normal heart sounds. No murmur heard. Bilateral leg swelling, left more than right, with redness. PULMONARY: Effort normal and breath sounds normal. Lungs clear to auscultation, no crackles. No respiratory distress. PSYCHIATRIC: Patient has a normal mood and affect. Behavior  is normal. Judgment and thought content normal.     Vitals:   01/24/24 0959 01/24/24 1050  BP: (!) 144/76 (!) 146/80  Pulse: 79   Resp: 16   SpO2: 99%   Weight: 204 lb 6.4 oz (92.7 kg)   Height: 5' 7 (1.702 m)     Body mass index is 32.01 kg/m.  Recent Results (from the past 2160 hours)  CBC with Differential (Cancer Center Only)     Status: None   Collection Time: 12/04/23 11:10 AM  Result Value Ref Range   WBC Count 4.3 4.0 - 10.5 K/uL   RBC 4.62 3.87 - 5.11 MIL/uL   Hemoglobin 12.9 12.0 - 15.0 g/dL   HCT 59.5 63.9 - 53.9 %   MCV 87.4 80.0 - 100.0 fL   MCH 27.9 26.0 - 34.0 pg   MCHC 31.9 30.0 - 36.0 g/dL   RDW 85.3 88.4 - 84.4 %   Platelet Count 220 150 - 400 K/uL   nRBC 0.0 0.0 - 0.2 %  Neutrophils Relative % 45 %   Neutro Abs 2.0 1.7 - 7.7 K/uL   Lymphocytes Relative 38 %   Lymphs Abs 1.6 0.7 - 4.0 K/uL   Monocytes Relative 9 %   Monocytes Absolute 0.4 0.1 - 1.0 K/uL   Eosinophils Relative 7 %   Eosinophils Absolute 0.3 0.0 - 0.5 K/uL   Basophils Relative 1 %   Basophils Absolute 0.0 0.0 - 0.1 K/uL   Immature Granulocytes 0 %   Abs Immature Granulocytes 0.01 0.00 - 0.07 K/uL    Comment: Performed at [facility], [address] [place], [place] [address]  CMP (Cancer Center only)     Status: Abnormal   Collection Time: 12/04/23 11:10 AM  Result Value Ref Range   Sodium 136 135 - 145 mmol/L   Potassium 3.9 3.5 - 5.1 mmol/L   Chloride 100 98 - 111 mmol/L   CO2 27 22 - 32 mmol/L   Glucose, Bld 108 (H) 70 - 99 mg/dL    Comment: Glucose reference range applies only to samples taken after fasting for at least 8 hours.   BUN 19 8 - 23 mg/dL   Creatinine 9.16 9.55 - 1.00 mg/dL   Calcium  9.5 8.9 - 10.3 mg/dL   Total Protein 7.7 6.5 - 8.1 g/dL   Albumin 4.2 3.5 - 5.0 g/dL   AST 25 15 - 41 U/L   ALT 22 0 - 44 U/L   Alkaline Phosphatase 57 38 - 126 U/L   Total Bilirubin 0.8 0.0 - 1.2 mg/dL   GFR, Estimated >39 >39 mL/min    Comment: (NOTE) Calculated  using the CKD-EPI Creatinine Equation (2021)    Anion gap 9 5 - 15    Comment: Performed at [facility], [address] [place], [place] [address]    Diabetic Foot Exam:     PHQ2/9:    01/24/2024    9:59 AM 08/02/2023   10:20 AM 04/11/2023    8:57 AM 03/29/2023   11:22 AM 11/29/2022    8:41 AM  Depression screen PHQ 2/9  Decreased Interest 0 0 0 0 0  Down, Depressed, Hopeless 0 0 0 0 0  PHQ - 2 Score 0 0 0 0 0  Altered sleeping   0 0 0  Tired, decreased energy   0 0 0  Change in appetite   0 0 0  Feeling bad or failure about yourself    0 0 0  Trouble concentrating   0 0 0  Moving slowly or fidgety/restless   0 0 0  Suicidal thoughts   0 0 0  PHQ-9 Score   0  0  0   Difficult doing work/chores   Not difficult at all Not difficult at all      Data saved with a previous flowsheet row definition    phq 9 is negative  Fall Risk:    01/24/2024    9:59 AM 08/02/2023   10:20 AM 04/11/2023    8:54 AM 03/29/2023   11:22 AM 11/29/2022    8:41 AM  Fall Risk   Falls in the past year? 0 0 0 0 0  Number falls in past yr: 0 0 0 0   Injury with Fall? 0 0  0     Risk for fall due to : No Fall Risks No Fall Risks No Fall Risks No Fall Risks Impaired balance/gait  Follow up Falls evaluation completed Falls evaluation completed Falls evaluation completed Falls prevention  discussed;Education provided;Falls evaluation completed Falls prevention discussed;Education provided;Falls evaluation completed     Data saved with a previous flowsheet row definition      Assessment & Plan Chronic venous insufficiency with bilateral lower extremity edema Significant bilateral edema, left leg more affected post-knee replacement. No DVT on ultrasound. Edema likely worsened by holiday activities, salt intake, and diuretic discontinuation. No heart failure signs. - Referred to vascular specialist. - Continue triamterene  HCTZ. - Advised compression stockings. - Encouraged leg elevation and salt  reduction. - Discussed Unna boot with specialist.  Hypertension Blood pressure in 170s, complicated by fluid retention and diuretic discontinuation. On metoprolol  50 mg. - Continue metoprolol  50 mg daily. - stay on Triamterene  hydrochlorothiazide   - Monitor blood pressure regularly. - Reassess at next follow-up.  Evaluation of elevated brain natriuretic peptide (BNP) Elevated BNP, no heart failure symptoms. No recent echocardiogram available. - Ordered repeat BNP test. - If elevated, order echocardiogram.        [1]  Current Outpatient Medications:    acetaminophen  (TYLENOL ) 500 MG tablet, Take 500-1,000 mg by mouth every 6 (six) hours as needed (pain.)., Disp: , Rfl:    aspirin  81 MG EC tablet, Take 81 mg by mouth in the morning., Disp: , Rfl:    Cholecalciferol  (VITAMIN D -3 PO), Take 2,000 Units by mouth in the morning., Disp: , Rfl:    CINNAMON PO, Take 1,000 mg by mouth in the morning., Disp: , Rfl:    DULoxetine  (CYMBALTA ) 60 MG capsule, TAKE 1 CAPSULE(60 MG) BY MOUTH EVERY MORNING, Disp: 90 capsule, Rfl: 0   Elastic Bandages & Supports (THUMB BRACE) MISC, 1 each by Does not apply route daily. Left thumb spica, Disp: 1 each, Rfl: 0   FISH OIL-BORAGE-FLAX-SAFFLOWER PO, Take 1 capsule by mouth in the morning., Disp: , Rfl:    letrozole  (FEMARA ) 2.5 MG tablet, Take 1 tablet (2.5 mg total) by mouth daily., Disp: 90 tablet, Rfl: 1   metoprolol  succinate (TOPROL -XL) 50 MG 24 hr tablet, Take 1 tablet (50 mg total) by mouth daily., Disp: 90 tablet, Rfl: 1   Multiple Vitamin (MULTIVITAMIN) capsule, Take 1 capsule by mouth daily., Disp: , Rfl:    rosuvastatin  (CRESTOR ) 5 MG tablet, TAKE 1 TABLET BY MOUTH EVERY MONDAY, WEDNESDAY, AND FRIDAY, Disp: 36 tablet, Rfl: 0   triamterene -hydrochlorothiazide  (MAXZIDE -25) 37.5-25 MG tablet, Take 1 tablet by mouth daily., Disp: 90 tablet, Rfl: 1   diclofenac  Sodium (VOLTAREN ) 1 % GEL, Apply 2 g topically 4 (four) times daily. (Patient not taking:  Reported on 01/24/2024), Disp: , Rfl:    traZODone  (DESYREL ) 50 MG tablet, Take 0.5-1 tablets (25-50 mg total) by mouth at bedtime as needed for sleep. (Patient not taking: Reported on 01/24/2024), Disp: 90 tablet, Rfl: 0 [2]  Allergies Allergen Reactions   Altace [Ramipril] Swelling and Other (See Comments)    lips

## 2024-01-25 LAB — BRAIN NATRIURETIC PEPTIDE: Brain Natriuretic Peptide: 139 pg/mL — ABNORMAL HIGH

## 2024-01-27 ENCOUNTER — Ambulatory Visit: Payer: Self-pay | Admitting: Family Medicine

## 2024-01-27 NOTE — Telephone Encounter (Signed)
 Copied from CRM #[id number]. Topic: Clinical - Lab/Test Results >> Jan 27, 2024  3:09 PM [name] wrote: Reason for CRM: Adv patient of lab results.Stacy [name]

## 2024-01-31 ENCOUNTER — Ambulatory Visit: Admitting: Family Medicine

## 2024-02-06 ENCOUNTER — Encounter: Payer: Self-pay | Admitting: Family Medicine

## 2024-02-06 ENCOUNTER — Ambulatory Visit (INDEPENDENT_AMBULATORY_CARE_PROVIDER_SITE_OTHER): Admitting: Family Medicine

## 2024-02-06 VITALS — BP 124/68 | HR 75 | Resp 16 | Ht 67.0 in | Wt 201.8 lb

## 2024-02-06 DIAGNOSIS — E1169 Type 2 diabetes mellitus with other specified complication: Secondary | ICD-10-CM

## 2024-02-06 DIAGNOSIS — Z1211 Encounter for screening for malignant neoplasm of colon: Secondary | ICD-10-CM

## 2024-02-06 DIAGNOSIS — I152 Hypertension secondary to endocrine disorders: Secondary | ICD-10-CM

## 2024-02-06 DIAGNOSIS — E1159 Type 2 diabetes mellitus with other circulatory complications: Secondary | ICD-10-CM

## 2024-02-06 DIAGNOSIS — G4701 Insomnia due to medical condition: Secondary | ICD-10-CM

## 2024-02-06 DIAGNOSIS — E785 Hyperlipidemia, unspecified: Secondary | ICD-10-CM

## 2024-02-06 DIAGNOSIS — C50919 Malignant neoplasm of unspecified site of unspecified female breast: Secondary | ICD-10-CM

## 2024-02-06 DIAGNOSIS — M150 Primary generalized (osteo)arthritis: Secondary | ICD-10-CM

## 2024-02-06 DIAGNOSIS — G894 Chronic pain syndrome: Secondary | ICD-10-CM

## 2024-02-06 LAB — POCT GLYCOSYLATED HEMOGLOBIN (HGB A1C): Hemoglobin A1C: 6.2 % — AB (ref 4.0–5.6)

## 2024-02-06 MED ORDER — ROSUVASTATIN CALCIUM 5 MG PO TABS: 5.0000 mg | ORAL_TABLET | ORAL | 1 refills | Status: AC

## 2024-02-06 MED ORDER — DULOXETINE HCL 60 MG PO CPEP: 60.0000 mg | ORAL_CAPSULE | Freq: Every day | ORAL | 1 refills | Status: AC

## 2024-02-06 MED ORDER — METOPROLOL SUCCINATE ER 50 MG PO TB24: 50.0000 mg | ORAL_TABLET | Freq: Every day | ORAL | 1 refills | Status: AC

## 2024-02-06 MED ORDER — TRIAMTERENE-HCTZ 37.5-25 MG PO TABS: 1.0000 | ORAL_TABLET | Freq: Every day | ORAL | 1 refills | Status: AC

## 2024-02-06 NOTE — Progress Notes (Signed)
 Name: Stacy [name]   MRN: [id number]    DOB: January 09, 1946   Date:02/06/2024       Progress Note  Subjective  Chief Complaint  Chief Complaint  Patient presents with   Medical Management of Chronic Issues   Discussed the use of AI scribe software for clinical note transcription with the patient, who gave verbal consent to proceed.  History of Present Illness Stacy [name] is a 79 year old female with diabetes and hypertension who presents for a six-month follow-up visit.  Her leg swelling has improved since her last visit two weeks ago. She has been elevating her legs on a wedge pillow while sleeping and using a leg massager. She has an upcoming appointment with a vascular doctor on the 29th of this month. No shortness of breath.  Her diabetes is currently managed with diet control, and her recent A1c is 6.2%, slightly higher than the previous 6.1%. She has been reducing her intake of carbohydrates and sweet beverages. No increased hunger, thirst, or urination.  She is taking rosuvastatin  5 mg three times a week and reports no muscle problems or side effects. She is also on metoprolol  50 mg once daily and triamterene /HCTZ 37.5/25 mg every morning, with no chest pain, palpitations, or dizziness reported.  She has a history of invasive carcinoma of the breast, stage 1A, diagnosed in 2024. She underwent a left simple mastectomy and is currently on letrozole  2.5 mg. She reports no symptoms or masses and had a normal mammogram on the right side in November 2025.  For chronic pain, she takes duloxetine  60 mg, which she finds helpful. She previously used trazodone  for sleep but has not needed it recently as her sleep has improved. She uses Tylenol  for pain management.  She uses a cane for joint support and has recently had ingrown toenails removed by a podiatrist. She wears compression stockings regularly.    Patient Active Problem List   Diagnosis Date Noted   Urge incontinence 08/02/2023    Primary osteoarthritis involving multiple joints 08/02/2023   Chronic pain syndrome 08/02/2023   Insomnia due to medical condition 08/02/2023   Aromatase inhibitor use 05/23/2022   Arthralgia 05/23/2022   Genetic testing 02/05/2022   Invasive carcinoma of breast (HCC) 01/16/2022   Goals of care, counseling/discussion 01/16/2022   Long term current use of anticoagulant therapy 06/09/2021   S/P TKR (total knee replacement) using cement, left 06/08/2021   Dyslipidemia associated with type 2 diabetes mellitus (HCC) 01/14/2020   Onychomycosis of multiple toenails with type 2 diabetes mellitus (HCC) 01/23/2019   Pain due to onychomycosis of toenails of both feet 10/06/2018   Abnormal EKG 12/10/2016   Radial scar of breast 05/30/2015   History of foot surgery 12/15/2014   Hypertension associated with type 2 diabetes mellitus (HCC) 10/07/2014   Seasonal allergies 10/07/2014   Vitamin D  deficiency 10/07/2014   Varicose veins 10/07/2014   Family history of breast cancer 05/12/2012    Past Surgical History:  Procedure Laterality Date   BREAST BIOPSY Right 2015   3 stereo biopsies. 2 benign. one was radial scar, no excision done for radial scar   BREAST BIOPSY Left 07/16/2017   BIPHASIC STROMAL AND EPITHELIAL LESION./ Dr Dessa   BREAST BIOPSY Left 08/23/2020   us  bx of mass 5:00 2cmfn, coil marker, FIBROEPITHELIAL LESION WITH INTRACANALICULAR GROWTH PATTERN AND VARIABLE STROMAL HYPERCELLULARITY   BREAST BIOPSY Left 01/02/2022   us  bx path pending 10:00 ribbon clip   BREAST BIOPSY Left  01/02/2022   US  LT BREAST BX W LOC DEV 1ST LESION IMG BX SPEC US  GUIDE 01/02/2022 [facility]   BREAST EXCISIONAL BIOPSY Left 1968   x 3 per pt   BREAST EXCISIONAL BIOPSY Right 2007   benign   BUNIONECTOMY Left    COLONOSCOPY  2011   DR,ISHAKIS   EXCISION OF BREAST LESION Left 10/07/2020   Procedure: EXCISION OF BREAST LESION;  Surgeon: Dessa Reyes ORN, MD;  Location: [facility];  Service:  General;  Laterality: Left; FIBROEPITHELIAL LESION WITH NTRACANALICULAR GROWTH PATTERN AND VARIABLE STROMAL   HAMMER TOE SURGERY Left    2nd toe   HEEL SPUR SURGERY Left 12/15/2014   Dr. Alona at [facility]   HEEL SPUR SURGERY Right    MASTECTOMY W/ SENTINEL NODE BIOPSY Left 01/31/2022   Procedure: MASTECTOMY WITH SENTINEL LYMPH NODE BIOPSY;  Surgeon: Rodolph Romano, MD;  Location: [facility];  Service: General;  Laterality: Left;   TOTAL KNEE ARTHROPLASTY Left 06/08/2021   Procedure: TOTAL KNEE ARTHROPLASTY;  Surgeon: Kathlynn Sharper, MD;  Location: [facility];  Service: Orthopedics;  Laterality: Left;    Family History  Problem Relation Age of Onset   Stroke Mother    Arthritis Mother    Breast cancer Sister 38   Breast cancer Sister    Cancer Brother 73       kidney   Kidney cancer Brother    Cancer Maternal Grandmother        Gallbladder   Stroke Paternal Grandmother     Social History   Tobacco Use   Smoking status: Former    Current packs/day: 0.00    Average packs/day: 0.3 packs/day for 2.0 years (0.5 ttl pk-yrs)    Types: Cigarettes    Start date: 5    Quit date: 1974    Years since quitting: 52.0   Smokeless tobacco: Never   Tobacco comments:    smoking cessation materials not required  Substance Use Topics   Alcohol use: No    Alcohol/week: 0.0 standard drinks of alcohol    Current Medications[1]  Allergies[2]  I personally reviewed active problem list, medication list, allergies, family history with the patient/caregiver today.   ROS  Ten systems reviewed and is negative except as mentioned in HPI    Objective Physical Exam  CONSTITUTIONAL: Patient appears well-developed and well-nourished. No distress. HEENT: Head atraumatic, normocephalic, neck supple. CARDIOVASCULAR: Normal rate, regular rhythm and normal heart sounds. No murmur heard. Legs improved with less swelling. Trace pre tibial edema PULMONARY: Effort normal and breath  sounds normal. No respiratory distress. ABDOMINAL: There is no tenderness or distention. MUSCULOSKELETAL: slow gait, uses a cane PSYCHIATRIC: Patient has a normal mood and affect. Behavior is normal. Judgment and thought content normal.  Vitals:   02/06/24 1003  BP: 124/68  Pulse: 75  Resp: 16  SpO2: 95%  Weight: 201 lb 12.8 oz (91.5 kg)  Height: 5' 7 (1.702 m)    Body mass index is 31.61 kg/m.  Recent Results (from the past 2160 hours)  CBC with Differential (Cancer Center Only)     Status: None   Collection Time: 12/04/23 11:10 AM  Result Value Ref Range   WBC Count 4.3 4.0 - 10.5 K/uL   RBC 4.62 3.87 - 5.11 MIL/uL   Hemoglobin 12.9 12.0 - 15.0 g/dL   HCT 59.5 63.9 - 53.9 %   MCV 87.4 80.0 - 100.0 fL   MCH 27.9 26.0 - 34.0 pg   MCHC 31.9 30.0 -  36.0 g/dL   RDW 85.3 88.4 - 84.4 %   Platelet Count 220 150 - 400 K/uL   nRBC 0.0 0.0 - 0.2 %   Neutrophils Relative % 45 %   Neutro Abs 2.0 1.7 - 7.7 K/uL   Lymphocytes Relative 38 %   Lymphs Abs 1.6 0.7 - 4.0 K/uL   Monocytes Relative 9 %   Monocytes Absolute 0.4 0.1 - 1.0 K/uL   Eosinophils Relative 7 %   Eosinophils Absolute 0.3 0.0 - 0.5 K/uL   Basophils Relative 1 %   Basophils Absolute 0.0 0.0 - 0.1 K/uL   Immature Granulocytes 0 %   Abs Immature Granulocytes 0.01 0.00 - 0.07 K/uL    Comment: Performed at [facility], [address] [place], [place] [address]  CMP (Cancer Center only)     Status: Abnormal   Collection Time: 12/04/23 11:10 AM  Result Value Ref Range   Sodium 136 135 - 145 mmol/L   Potassium 3.9 3.5 - 5.1 mmol/L   Chloride 100 98 - 111 mmol/L   CO2 27 22 - 32 mmol/L   Glucose, Bld 108 (H) 70 - 99 mg/dL    Comment: Glucose reference range applies only to samples taken after fasting for at least 8 hours.   BUN 19 8 - 23 mg/dL   Creatinine 9.16 9.55 - 1.00 mg/dL   Calcium  9.5 8.9 - 10.3 mg/dL   Total Protein 7.7 6.5 - 8.1 g/dL   Albumin 4.2 3.5 - 5.0 g/dL   AST 25 15 - 41 U/L   ALT 22 0  - 44 U/L   Alkaline Phosphatase 57 38 - 126 U/L   Total Bilirubin 0.8 0.0 - 1.2 mg/dL   GFR, Estimated >39 >39 mL/min    Comment: (NOTE) Calculated using the CKD-EPI Creatinine Equation (2021)    Anion gap 9 5 - 15    Comment: Performed at [facility], [address] [place], [place] [address]  B Nat Peptide     Status: Abnormal   Collection Time: 01/24/24 10:54 AM  Result Value Ref Range   Brain Natriuretic Peptide 139 (H) <100 pg/mL    Comment: . BNP levels increase with age in the general population with the highest values seen in individuals greater than 59 years of age. Reference: J. Am. Penne. Cardiol. 2002; 59:023-017. SABRA   POCT glycosylated hemoglobin (Hb A1C)     Status: Abnormal   Collection Time: 02/06/24 10:09 AM  Result Value Ref Range   Hemoglobin A1C 6.2 (A) 4.0 - 5.6 %   HbA1c POC (<> result, manual entry)     HbA1c, POC (prediabetic range)     HbA1c, POC (controlled diabetic range)      Diabetic Foot Exam:     PHQ2/9:    02/06/2024    9:55 AM 01/24/2024    9:59 AM 08/02/2023   10:20 AM 04/11/2023    8:57 AM 03/29/2023   11:22 AM  Depression screen PHQ 2/9  Decreased Interest 0 0 0 0 0  Down, Depressed, Hopeless 0 0 0 0 0  PHQ - 2 Score 0 0 0 0 0  Altered sleeping    0 0  Tired, decreased energy    0 0  Change in appetite    0 0  Feeling bad or failure about yourself     0 0  Trouble concentrating    0 0  Moving slowly or fidgety/restless    0 0  Suicidal  thoughts    0 0  PHQ-9 Score    0  0   Difficult doing work/chores    Not difficult at all Not difficult at all     Data saved with a previous flowsheet row definition    phq 9 is negative  Fall Risk:    02/06/2024    9:55 AM 01/24/2024    9:59 AM 08/02/2023   10:20 AM 04/11/2023    8:54 AM 03/29/2023   11:22 AM  Fall Risk   Falls in the past year? 0 0 0 0 0  Number falls in past yr: 0 0 0 0 0  Injury with Fall? 0 0 0  0    Risk for fall due to : No Fall Risks No Fall Risks No Fall  Risks No Fall Risks No Fall Risks  Follow up Falls evaluation completed Falls evaluation completed Falls evaluation completed Falls evaluation completed Falls prevention discussed;Education provided;Falls evaluation completed     Data saved with a previous flowsheet row definition     Assessment & Plan Type 2 diabetes mellitus with hypertension and hyperlipidemia Diabetes well-controlled with diet, A1c 6.2%. Hypertension controlled with metoprolol  and triamterene  HCTZ, BP 124/68 mmHg. Hyperlipidemia managed with rosuvastatin , no muscle issues. - Continue current diabetes management with diet control. - Continue metoprolol  50 mg daily and triamterene  HCTZ 37.5/25 mg daily. - Continue rosuvastatin  5 mg three times a week. - Ordered lipid panel, comprehensive metabolic panel, and urine protein test. - Continue vitamin D  supplementation.  Invasive carcinoma of left breast, on adjuvant therapy Stage 1A invasive carcinoma, post-mastectomy, on letrozole  for adjuvant therapy, well-tolerated. Last mammogram normal. - Continue letrozole  2.5 mg daily for 5 years. - Continue annual mammogram for the right breast.  Chronic pain syndrome with generalized osteoarthritis Chronic pain managed with duloxetine . Uses cane for support. - Continue duloxetine  60 mg daily for pain management. - Continue use of cane for joint support.  Insomnia due to medical condition Insomnia previously managed with trazodone , currently not needed. - Discontinued trazodone  as sleep is adequate.  Colon cancer screening Continues annual colon cancer screening with FIT, last test negative. - Ordered annual fecal immunochemical test (FIT) for colon cancer screening.        [1]  Current Outpatient Medications:    acetaminophen  (TYLENOL ) 500 MG tablet, Take 500-1,000 mg by mouth every 6 (six) hours as needed (pain.)., Disp: , Rfl:    aspirin  81 MG EC tablet, Take 81 mg by mouth in the morning., Disp: , Rfl:     Cholecalciferol  (VITAMIN D -3 PO), Take 2,000 Units by mouth in the morning., Disp: , Rfl:    CINNAMON PO, Take 1,000 mg by mouth in the morning., Disp: , Rfl:    Elastic Bandages & Supports (THUMB BRACE) MISC, 1 each by Does not apply route daily. Left thumb spica, Disp: 1 each, Rfl: 0   FISH OIL-BORAGE-FLAX-SAFFLOWER PO, Take 1 capsule by mouth in the morning., Disp: , Rfl:    letrozole  (FEMARA ) 2.5 MG tablet, Take 1 tablet (2.5 mg total) by mouth daily., Disp: 90 tablet, Rfl: 1   Multiple Vitamin (MULTIVITAMIN) capsule, Take 1 capsule by mouth daily., Disp: , Rfl:    diclofenac  Sodium (VOLTAREN ) 1 % GEL, Apply 2 g topically 4 (four) times daily. (Patient not taking: Reported on 02/06/2024), Disp: , Rfl:    DULoxetine  (CYMBALTA ) 60 MG capsule, Take 1 capsule (60 mg total) by mouth daily., Disp: 90 capsule, Rfl: 1   metoprolol  succinate (TOPROL -XL)  50 MG 24 hr tablet, Take 1 tablet (50 mg total) by mouth daily., Disp: 90 tablet, Rfl: 1   [START ON 02/07/2024] rosuvastatin  (CRESTOR ) 5 MG tablet, Take 1 tablet (5 mg total) by mouth 3 (three) times a week., Disp: 36 tablet, Rfl: 1   traZODone  (DESYREL ) 50 MG tablet, Take 0.5-1 tablets (25-50 mg total) by mouth at bedtime as needed for sleep. (Patient not taking: Reported on 02/06/2024), Disp: 90 tablet, Rfl: 0   triamterene -hydrochlorothiazide  (MAXZIDE -25) 37.5-25 MG tablet, Take 1 tablet by mouth daily., Disp: 90 tablet, Rfl: 1 [2]  Allergies Allergen Reactions   Altace [Ramipril] Swelling and Other (See Comments)    lips

## 2024-02-07 ENCOUNTER — Ambulatory Visit: Payer: Self-pay | Admitting: Family Medicine

## 2024-02-07 LAB — LIPID PANEL
Cholesterol: 104 mg/dL
HDL: 60 mg/dL
LDL Cholesterol (Calc): 30 mg/dL
Non-HDL Cholesterol (Calc): 44 mg/dL
Total CHOL/HDL Ratio: 1.7 (calc)
Triglycerides: 49 mg/dL

## 2024-02-07 LAB — MICROALBUMIN / CREATININE URINE RATIO
Creatinine, Urine: 105 mg/dL (ref 20–275)
Microalb Creat Ratio: 3 mg/g{creat}
Microalb, Ur: 0.3 mg/dL

## 2024-02-07 LAB — COMPREHENSIVE METABOLIC PANEL WITH GFR
AG Ratio: 1.5 (calc) (ref 1.0–2.5)
ALT: 15 U/L (ref 6–29)
AST: 15 U/L (ref 10–35)
Albumin: 4.1 g/dL (ref 3.6–5.1)
Alkaline phosphatase (APISO): 52 U/L (ref 37–153)
BUN: 18 mg/dL (ref 7–25)
CO2: 32 mmol/L (ref 20–32)
Calcium: 9.7 mg/dL (ref 8.6–10.4)
Chloride: 103 mmol/L (ref 98–110)
Creat: 0.85 mg/dL (ref 0.60–1.00)
Globulin: 2.7 g/dL (ref 1.9–3.7)
Glucose, Bld: 97 mg/dL (ref 65–99)
Potassium: 4.6 mmol/L (ref 3.5–5.3)
Sodium: 141 mmol/L (ref 135–146)
Total Bilirubin: 0.4 mg/dL (ref 0.2–1.2)
Total Protein: 6.8 g/dL (ref 6.1–8.1)
eGFR: 71 mL/min/1.73m2

## 2024-02-10 ENCOUNTER — Ambulatory Visit: Admitting: Family Medicine

## 2024-02-13 ENCOUNTER — Ambulatory Visit (INDEPENDENT_AMBULATORY_CARE_PROVIDER_SITE_OTHER): Admitting: Nurse Practitioner

## 2024-02-13 ENCOUNTER — Encounter (INDEPENDENT_AMBULATORY_CARE_PROVIDER_SITE_OTHER): Payer: Self-pay | Admitting: Nurse Practitioner

## 2024-02-13 VITALS — BP 160/73 | HR 67 | Resp 17 | Ht 66.0 in | Wt 206.2 lb

## 2024-02-13 DIAGNOSIS — M150 Primary generalized (osteo)arthritis: Secondary | ICD-10-CM | POA: Diagnosis not present

## 2024-02-13 DIAGNOSIS — I872 Venous insufficiency (chronic) (peripheral): Secondary | ICD-10-CM

## 2024-02-13 DIAGNOSIS — I890 Lymphedema, not elsewhere classified: Secondary | ICD-10-CM | POA: Diagnosis not present

## 2024-02-14 ENCOUNTER — Encounter (INDEPENDENT_AMBULATORY_CARE_PROVIDER_SITE_OTHER): Payer: Self-pay | Admitting: Nurse Practitioner

## 2024-02-14 LAB — FECAL GLOBIN BY IMMUNOCHEMISTRY
FECAL GLOBIN RESULT:: NOT DETECTED
MICRO NUMBER:: 17526639
SPECIMEN QUALITY:: ADEQUATE

## 2024-02-14 NOTE — Progress Notes (Unsigned)
 "  Subjective:    Patient ID: Therisa Stoney Ill, female    DOB: 1945/03/09, 79 y.o.   MRN: [id number] Chief Complaint  Patient presents with   New Patient (Initial Visit)    LS 09/08/20. consult. BLE swelling  ref: Glenard Mire     HPI  Discussed the use of AI scribe software for clinical note transcription with the patient, who gave verbal consent to proceed.  History of Present Illness Traeh Milroy is a 79 year old female with chronic venous insufficiency and lymphedema who presents for evaluation of persistent bilateral lower extremity swelling and stasis dermatitis.  She reports chronic bilateral lower extremity swelling, fluctuating in severity and currently less pronounced than previously. Swelling is exacerbated by prolonged standing and improves with leg elevation and compression therapy. She notes associated spider veins and intermittent pain in the left ankle and foot, which she attributes to osteoarthritis. She denies wounds, ulceration, or serous drainage. No recent infections or cellulitis. She denies chest pain, dyspnea, or systemic symptoms.  She has a history of left knee replacement and left foot/ankle surgery with hardware placement and prior ingrown toenail removal. She notes increased swelling and intermittent pain in the left leg and foot since these procedures. She occasionally uses a cane for ambulation outside the home and remains active, walking in stores with a cane and using a stationary bike at home. She prefers slip-on shoes and wears Crocs indoors.  She adheres to conservative management, including daily compression stockings, leg elevation with a wedge pillow at night, and regular use of a pneumatic compression pump, especially in the evenings. She has tried both open- and closed-toe compression stockings, with variable comfort due to toe swelling.  Lower extremity venous duplex ultrasound at [facility] ruled out deep vein thrombosis. Three years ago,  venous studies demonstrated significant deep and superficial venous insufficiency in the left leg, with no significant findings in the right. She was advised to continue conservative therapy. She is currently taking long-term cancer medication, which she suspects may contribute to her swelling, as well as antihypertensive therapy including hydrochlorothiazide  and metoprolol . She is not taking additional diuretics.    Results Diagnostic Lower extremity venous duplex ultrasound: No evidence of deep vein thrombosis. Lower extremity venous duplex ultrasound (01/2021): Left: Deep venous insufficiency and saphenous vein reflux. Right: No venous insufficiency.   Review of Systems     Objective:   Physical Exam  Physical Exam EXTREMITIES: Redness and discoloration on legs. Good pulses in extremities.  BP (!) 160/73   Pulse 67   Resp 17   Ht 5' 6 (1.676 m)   Wt 206 lb 3.2 oz (93.5 kg)   BMI 33.28 kg/m   Past Medical History:  Diagnosis Date   Allergy    Arthritis    Cancer (HCC)    GERD (gastroesophageal reflux disease)    Hyperlipidemia    Hypertension    Neoplasm of uncertain behavior of breast 2007   Pre-diabetes    Vertigo     Social History   Socioeconomic History   Marital status: Married    Spouse name: Richard   Number of children: 1   Years of education: Not on file   Highest education level: 12th grade  Occupational History   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  Tobacco Use   Smoking status: Former    Current packs/day: 0.00    Average packs/day: 0.3 packs/day for 2.0 years (0.5 ttl pk-yrs)  Types: Cigarettes    Start date: 24    Quit date: 93    Years since quitting: 52.1   Smokeless tobacco: Never   Tobacco comments:    smoking cessation materials not required  Vaping Use   Vaping status: Never Used  Substance and Sexual Activity   Alcohol use: No    Alcohol/week: 0.0 standard drinks of alcohol   Drug use: No    Sexual activity: Not Currently    Comment: Husband has prostate issues  Other Topics Concern   Not on file  Social History Narrative   Not on file   Social Drivers of Health   Tobacco Use: Medium Risk (02/14/2024)   Patient History    Smoking Tobacco Use: Former    Smokeless Tobacco Use: Never    Passive Exposure: Not on Actuary Strain: Low Risk  (11/23/2023)   Received from [facility]   Overall Financial Resource Strain (CARDIA)    Difficulty of Paying Living Expenses: Not very hard  Food Insecurity: No Food Insecurity (11/23/2023)   Received from [facility]   Epic    Within the past 12 months, you worried that your food would run out before you got the money to buy more.: Never true    Within the past 12 months, the food you bought just didn't last and you didn't have money to get more.: Never true  Transportation Needs: No Transportation Needs (11/23/2023)   Received from [facility] - Transportation    In the past 12 months, has lack of transportation kept you from medical appointments or from getting medications?: No    Lack of Transportation (Non-Medical): No  Physical Activity: Insufficiently Active (04/11/2023)   Exercise Vital Sign    Days of Exercise per Week: 1 day    Minutes of Exercise per Session: 10 min  Stress: No Stress Concern Present (04/11/2023)   [employer] of [employer]ational [employer] - Occupational Stress Questionnaire    Feeling of Stress : Not at all  Social Connections: Socially Integrated (04/11/2023)   Social Connection and Isolation Panel    Frequency of Communication with Friends and Family: More than three times a week    Frequency of Social Gatherings with Friends and Family: Once a week    Attends Religious Services: More than 4 times per year    Active Member of [employer] or Organizations: Yes    Attends Banker Meetings: 1 to 4 times per  year    Marital Status: Married  Catering Manager Violence: Not At Risk (04/11/2023)   Humiliation, Afraid, Rape, and Kick questionnaire    Fear of Current or Ex-Partner: No    Emotionally Abused: No    Physically Abused: No    Sexually Abused: No  Depression (PHQ2-9): Low Risk (02/06/2024)   Depression (PHQ2-9)    PHQ-2 Score: 0  Alcohol Screen: Low Risk (04/11/2023)   Alcohol Screen    Last Alcohol Screening Score (AUDIT): 0  Housing: Low Risk  (11/23/2023)   Received from [facility]   Epic    In the last 12 months, was there a time when you were not able to pay the mortgage or rent on time?: No    In the past 12 months, how many times have you moved where you were living?: 0    At any time in the past 12 months, were you homeless or living in a shelter (  including now)?: No  Utilities: Not At Risk (11/23/2023)   Received from [facility]   Epic    In the past 12 months has the electric, gas, oil, or water  company threatened to shut off services in your home?: No  Health Literacy: Not on file    Past Surgical History:  Procedure Laterality Date   BREAST BIOPSY Right 2015   3 stereo biopsies. 2 benign. one was radial scar, no excision done for radial scar   BREAST BIOPSY Left 07/16/2017   BIPHASIC STROMAL AND EPITHELIAL LESION./ Dr Dessa   BREAST BIOPSY Left 08/23/2020   us  bx of mass 5:00 2cmfn, coil marker, FIBROEPITHELIAL LESION WITH INTRACANALICULAR GROWTH PATTERN AND VARIABLE STROMAL HYPERCELLULARITY   BREAST BIOPSY Left 01/02/2022   us  bx path pending 10:00 ribbon clip   BREAST BIOPSY Left 01/02/2022   US  LT BREAST BX W LOC DEV 1ST LESION IMG BX SPEC US  GUIDE 01/02/2022 [facility]   BREAST EXCISIONAL BIOPSY Left 1968   x 3 per pt   BREAST EXCISIONAL BIOPSY Right 2007   benign   BUNIONECTOMY Left    COLONOSCOPY  2011   DR,ISHAKIS   EXCISION OF BREAST LESION Left 10/07/2020   Procedure: EXCISION OF BREAST  LESION;  Surgeon: Dessa Reyes ORN, MD;  Location: [facility] OR[facility];  Service: General;  Laterality: Left; FIBROEPITHELIAL LESION WITH NTRACANALICULAR GROWTH PATTERN AND VARIABLE STROMAL   HAMMER TOE SURGERY Left    2nd toe   HEEL SPUR SURGERY Left 12/15/2014   Dr. Alona at [facility]   HEEL SPUR SURGERY Right    MASTECTOMY W/ SENTINEL NODE BIOPSY Left 01/31/2022   Procedure: MASTECTOMY WITH SENTINEL LYMPH NODE BIOPSY;  Surgeon: Rodolph Romano, MD;  Location: [facility] OR[facility];  Service: General;  Laterality: Left;   TOTAL KNEE ARTHROPLASTY Left 06/08/2021   Procedure: TOTAL KNEE ARTHROPLASTY;  Surgeon: Kathlynn Sharper, MD;  Location: [facility] OR[facility];  Service: Orthopedics;  Laterality: Left;    Family History  Problem Relation Age of Onset   Stroke Mother    Arthritis Mother    Breast cancer Sister 31   Breast cancer Sister    Cancer Brother 7       kidney   Kidney cancer Brother    Cancer Maternal Grandmother        Gallbladder   Stroke Paternal Grandmother     Allergies[1]     Latest Ref Rng & Units 12/04/2023   11:10 AM 05/29/2023   10:05 AM 11/26/2022    9:49 AM  CBC  WBC 4.0 - 10.5 K/uL 4.3  3.6  4.1   Hemoglobin 12.0 - 15.0 g/dL 87.0  87.9  87.0   Hematocrit 36.0 - 46.0 % 40.4  38.5  40.3   Platelets 150 - 400 K/uL 220  207  237       CMP     Component Value Date/Time   NA 141 02/06/2024 1044   NA 143 04/06/2015 0902   K 4.6 02/06/2024 1044   CL 103 02/06/2024 1044   CO2 32 02/06/2024 1044   GLUCOSE 97 02/06/2024 1044   BUN 18 02/06/2024 1044   BUN 20 04/06/2015 0902   CREATININE 0.85 02/06/2024 1044   CALCIUM  9.7 02/06/2024 1044   PROT 6.8 02/06/2024 1044   PROT 7.2 04/06/2015 0902   ALBUMIN 4.2 12/04/2023 1110   ALBUMIN 4.0 04/06/2015 0902   AST 15 02/06/2024 1044   AST 25 12/04/2023 1110   ALT 15 02/06/2024 1044  ALT 22 12/04/2023 1110   ALKPHOS 57 12/04/2023 1110   BILITOT 0.4 02/06/2024 1044   BILITOT 0.8 12/04/2023 1110   EGFR 71  02/06/2024 1044   GFRNONAA >60 12/04/2023 1110   GFRNONAA 64 07/22/2019 1110     No results found.     Assessment & Plan:   There are no diagnoses linked to this encounter.  Assessment and Plan Assessment & Plan Chronic venous insufficiency with stasis dermatitis Chronic left-sided venous insufficiency with superficial saphenous and deep venous reflux causing stasis dermatitis. Conservative management preferred due to limited benefit from invasive procedures. - Ordered repeat venous duplex ultrasound to reassess venous insufficiency. - Recommended daily use of compression stockings, proper fit, and replacement every 3-4 months. - Advised leg elevation with wedge pillow at night and during rest. - Recommended consistent use of pneumatic compression pump in the evenings. - Advised regular moisturization of lower extremity skin to prevent dryness and cellulitis. - Discussed avoidance of topical steroid creams. - Scheduled follow-up in three months. - Provided guidance to report sudden worsening of swelling, wounds, or leakage.  Lymphedema of lower extremities Chronic lymphedema likely due to venous insufficiency, prior surgeries, and medication effects. Diuretics ineffective; conservative management necessary. - Reinforced daily compression stocking use and proper fit. - Recommended leg elevation and consistent use of pneumatic compression device in the evenings. - Encouraged regular ambulation and walking 3-4 days per week, 20-30 minutes per session. - Advised against routine use of diuretics. - Provided education on chronic nature and need for ongoing management.  Osteoarthritis of left ankle and foot Osteoarthritis with prior surgeries causing pain, swelling, and reduced mobility, exacerbating lower extremity edema. - Encouraged use of assistive devices (cane) for ambulation as needed. - Recommended regular ambulation and activity as tolerated, with stationary bike as an  alternative.     Medications Ordered Prior to Encounter[2]  There are no Patient Instructions on file for this visit. Return in about 3 months (around 05/13/2024) for No studies 3 months GS/FB.   Karlea Mckibbin E Mirah Nevins, NP        [1] Allergies Allergen Reactions   Altace [Ramipril] Swelling and Other (See Comments)    lips  [2] Current Outpatient Medications on File Prior to Visit  Medication Sig Dispense Refill   acetaminophen  (TYLENOL ) 500 MG tablet Take 500-1,000 mg by mouth every 6 (six) hours as needed (pain.).     aspirin  81 MG EC tablet Take 81 mg by mouth in the morning.     Cholecalciferol  (VITAMIN D -3 PO) Take 2,000 Units by mouth in the morning.     CINNAMON PO Take 1,000 mg by mouth in the morning.     Elastic Bandages & Supports (THUMB BRACE) MISC 1 each by Does not apply route daily. Left thumb spica 1 each 0   FISH OIL-BORAGE-FLAX-SAFFLOWER PO Take 1 capsule by mouth in the morning.     letrozole  (FEMARA ) 2.5 MG tablet Take 1 tablet (2.5 mg total) by mouth daily. 90 tablet 1   Multiple Vitamin (MULTIVITAMIN) capsule Take 1 capsule by mouth daily.     diclofenac  Sodium (VOLTAREN ) 1 % GEL Apply 2 g topically 4 (four) times daily. (Patient not taking: Reported on 02/06/2024)     DULoxetine  (CYMBALTA ) 60 MG capsule Take 1 capsule (60 mg total) by mouth daily. 90 capsule 1   metoprolol  succinate (TOPROL -XL) 50 MG 24 hr tablet Take 1 tablet (50 mg total) by mouth daily. 90 tablet 1   rosuvastatin  (CRESTOR ) 5 MG tablet  Take 1 tablet (5 mg total) by mouth 3 (three) times a week. 36 tablet 1   traZODone  (DESYREL ) 50 MG tablet Take 0.5-1 tablets (25-50 mg total) by mouth at bedtime as needed for sleep. (Patient not taking: Reported on 02/06/2024) 90 tablet 0   triamterene -hydrochlorothiazide  (MAXZIDE -25) 37.5-25 MG tablet Take 1 tablet by mouth daily. 90 tablet 1   No current facility-administered medications on file prior to visit.  "

## 2024-04-09 ENCOUNTER — Ambulatory Visit: Admitting: Podiatry

## 2024-04-16 ENCOUNTER — Ambulatory Visit

## 2024-05-14 ENCOUNTER — Ambulatory Visit (INDEPENDENT_AMBULATORY_CARE_PROVIDER_SITE_OTHER): Admitting: Nurse Practitioner

## 2024-06-04 ENCOUNTER — Inpatient Hospital Stay

## 2024-06-04 ENCOUNTER — Inpatient Hospital Stay: Admitting: Oncology

## 2024-07-31 ENCOUNTER — Ambulatory Visit: Admitting: Family Medicine
